# Patient Record
Sex: Female | Born: 1975 | Hispanic: Yes | Marital: Married | Smoking: Never smoker
Health system: Southern US, Community
[De-identification: ages and names within clinical notes are randomized; demographics above are authoritative.]

## PROBLEM LIST (undated history)

## (undated) DIAGNOSIS — R809 Proteinuria, unspecified: Secondary | ICD-10-CM

## (undated) DIAGNOSIS — IMO0002 Reserved for concepts with insufficient information to code with codable children: Secondary | ICD-10-CM

## (undated) DIAGNOSIS — N289 Disorder of kidney and ureter, unspecified: Secondary | ICD-10-CM

## (undated) DIAGNOSIS — K851 Biliary acute pancreatitis without necrosis or infection: Secondary | ICD-10-CM

## (undated) DIAGNOSIS — M3214 Glomerular disease in systemic lupus erythematosus: Secondary | ICD-10-CM

## (undated) DIAGNOSIS — Z992 Dependence on renal dialysis: Secondary | ICD-10-CM

## (undated) DIAGNOSIS — N179 Acute kidney failure, unspecified: Secondary | ICD-10-CM

## (undated) DIAGNOSIS — Z5189 Encounter for other specified aftercare: Secondary | ICD-10-CM

## (undated) DIAGNOSIS — D696 Thrombocytopenia, unspecified: Secondary | ICD-10-CM

## (undated) DIAGNOSIS — I509 Heart failure, unspecified: Secondary | ICD-10-CM

## (undated) DIAGNOSIS — D069 Carcinoma in situ of cervix, unspecified: Secondary | ICD-10-CM

## (undated) DIAGNOSIS — J189 Pneumonia, unspecified organism: Secondary | ICD-10-CM

## (undated) DIAGNOSIS — IMO0001 Reserved for inherently not codable concepts without codable children: Secondary | ICD-10-CM

## (undated) DIAGNOSIS — G40909 Epilepsy, unspecified, not intractable, without status epilepticus: Secondary | ICD-10-CM

## (undated) DIAGNOSIS — D649 Anemia, unspecified: Secondary | ICD-10-CM

## (undated) DIAGNOSIS — M329 Systemic lupus erythematosus, unspecified: Secondary | ICD-10-CM

## (undated) DIAGNOSIS — N189 Chronic kidney disease, unspecified: Secondary | ICD-10-CM

## (undated) DIAGNOSIS — I639 Cerebral infarction, unspecified: Secondary | ICD-10-CM

## (undated) DIAGNOSIS — I10 Essential (primary) hypertension: Secondary | ICD-10-CM

## (undated) HISTORY — DX: Anemia, unspecified: D64.9

## (undated) HISTORY — PX: DILATION AND CURETTAGE OF UTERUS: SHX78

## (undated) HISTORY — PX: APPENDECTOMY: SHX54

## (undated) HISTORY — PX: AV FISTULA PLACEMENT: SHX1204

## (undated) HISTORY — PX: TUBAL LIGATION: SHX77

## (undated) HISTORY — DX: Thrombocytopenia, unspecified: D69.6

## (undated) HISTORY — PX: CERVICAL CONE BIOPSY: SUR198

## (undated) HISTORY — PX: CHOLECYSTECTOMY: SHX55

---

## 2005-01-06 ENCOUNTER — Encounter: Payer: Self-pay | Admitting: Obstetrics and Gynecology

## 2005-01-06 ENCOUNTER — Inpatient Hospital Stay (HOSPITAL_COMMUNITY): Admission: AD | Admit: 2005-01-06 | Discharge: 2005-01-08 | Payer: Self-pay | Admitting: Obstetrics and Gynecology

## 2009-01-23 ENCOUNTER — Encounter (INDEPENDENT_AMBULATORY_CARE_PROVIDER_SITE_OTHER): Payer: Self-pay | Admitting: Unknown Physician Specialty

## 2009-01-23 ENCOUNTER — Other Ambulatory Visit: Admission: RE | Admit: 2009-01-23 | Discharge: 2009-01-23 | Payer: Self-pay | Admitting: Unknown Physician Specialty

## 2009-06-19 DIAGNOSIS — K851 Biliary acute pancreatitis without necrosis or infection: Secondary | ICD-10-CM

## 2009-06-19 HISTORY — DX: Biliary acute pancreatitis without necrosis or infection: K85.10

## 2009-07-08 ENCOUNTER — Inpatient Hospital Stay (HOSPITAL_COMMUNITY): Admission: EM | Admit: 2009-07-08 | Discharge: 2009-07-17 | Payer: Self-pay | Admitting: Emergency Medicine

## 2009-07-13 ENCOUNTER — Encounter (INDEPENDENT_AMBULATORY_CARE_PROVIDER_SITE_OTHER): Payer: Self-pay | Admitting: General Surgery

## 2009-07-13 ENCOUNTER — Ambulatory Visit: Payer: Self-pay | Admitting: Internal Medicine

## 2009-07-14 ENCOUNTER — Ambulatory Visit: Payer: Self-pay | Admitting: Internal Medicine

## 2009-07-16 ENCOUNTER — Ambulatory Visit: Payer: Self-pay | Admitting: Internal Medicine

## 2009-07-17 DIAGNOSIS — D069 Carcinoma in situ of cervix, unspecified: Secondary | ICD-10-CM

## 2009-07-17 HISTORY — DX: Carcinoma in situ of cervix, unspecified: D06.9

## 2009-07-19 ENCOUNTER — Encounter: Payer: Self-pay | Admitting: Internal Medicine

## 2009-07-25 ENCOUNTER — Encounter: Payer: Self-pay | Admitting: Internal Medicine

## 2009-07-31 ENCOUNTER — Other Ambulatory Visit: Admission: RE | Admit: 2009-07-31 | Discharge: 2009-07-31 | Payer: Self-pay | Admitting: Unknown Physician Specialty

## 2009-12-17 ENCOUNTER — Emergency Department (HOSPITAL_COMMUNITY): Admission: EM | Admit: 2009-12-17 | Discharge: 2009-12-17 | Payer: Self-pay | Admitting: Emergency Medicine

## 2010-06-18 ENCOUNTER — Other Ambulatory Visit (HOSPITAL_COMMUNITY): Admission: RE | Admit: 2010-06-18 | Payer: Self-pay | Source: Ambulatory Visit

## 2010-06-18 ENCOUNTER — Other Ambulatory Visit (HOSPITAL_COMMUNITY)
Admission: RE | Admit: 2010-06-18 | Discharge: 2010-06-18 | Disposition: A | Discharge status: Home / Self Care | Payer: Self-pay | Source: Ambulatory Visit | Attending: *Deleted | Admitting: *Deleted

## 2010-06-18 ENCOUNTER — Other Ambulatory Visit (HOSPITAL_COMMUNITY)
Admission: RE | Admit: 2010-06-18 | Discharge: 2010-06-18 | Disposition: A | Discharge status: Home / Self Care | Payer: Self-pay | Source: Ambulatory Visit | Attending: Unknown Physician Specialty | Admitting: Unknown Physician Specialty

## 2010-06-18 ENCOUNTER — Other Ambulatory Visit: Payer: Self-pay | Admitting: Nurse Practitioner

## 2010-06-18 DIAGNOSIS — R87612 Low grade squamous intraepithelial lesion on cytologic smear of cervix (LGSIL): Secondary | ICD-10-CM | POA: Insufficient documentation

## 2010-06-18 DIAGNOSIS — R87619 Unspecified abnormal cytological findings in specimens from cervix uteri: Secondary | ICD-10-CM | POA: Insufficient documentation

## 2010-06-18 NOTE — Consult Note (Signed)
Summary: Consultation Report  Consultation Report   Imported By: Zeb Comfort 07/25/2009 14:01:01  _____________________________________________________________________  External Attachment:    Type:   Image     Comment:   External Document

## 2010-06-19 ENCOUNTER — Other Ambulatory Visit: Payer: Self-pay

## 2010-08-02 LAB — URINALYSIS, ROUTINE W REFLEX MICROSCOPIC
Ketones, ur: >80 mg/dL — AB
Urobilinogen, UA: 1 mg/dL (ref 0.0–1.0)

## 2010-08-02 LAB — DIFFERENTIAL
Basophils Absolute: 0 10*3/uL (ref 0.0–0.1)
Basophils Relative: 0 % (ref 0–1)
Eosinophils Absolute: 0 10*3/uL (ref 0.0–0.7)
Lymphocytes Relative: 15 % (ref 12–46)

## 2010-08-02 LAB — URINE CULTURE
Colony Count: >=100000
Culture  Setup Time: 201108020047

## 2010-08-02 LAB — BASIC METABOLIC PANEL
Chloride: 104 mEq/L (ref 96–112)
Creatinine, Ser: 0.67 mg/dL (ref 0.4–1.2)
GFR calc Af Amer: >60 mL/min (ref >60)
Potassium: 3.7 mEq/L (ref 3.5–5.1)
Sodium: 134 mEq/L — ABNORMAL LOW (ref 135–145)

## 2010-08-02 LAB — CBC
MCH: 26.3 pg (ref 26.0–34.0)
MCHC: 33.6 g/dL (ref 30.0–36.0)
RBC: 4.88 MIL/uL (ref 3.87–5.11)
RDW: 15.5 % (ref 11.5–15.5)
WBC: 12.5 10*3/uL — ABNORMAL HIGH (ref 4.0–10.5)

## 2010-08-02 LAB — URINE MICROSCOPIC-ADD ON

## 2010-08-02 LAB — POCT PREGNANCY, URINE: Preg Test, Ur: NEGATIVE

## 2010-08-07 LAB — HEPATIC FUNCTION PANEL
ALT: 56 U/L — ABNORMAL HIGH (ref 0–35)
ALT: 65 U/L — ABNORMAL HIGH (ref 0–35)
ALT: 96 U/L — ABNORMAL HIGH (ref 0–35)
AST: 22 U/L (ref 0–37)
AST: 41 U/L — ABNORMAL HIGH (ref 0–37)
AST: 45 U/L — ABNORMAL HIGH (ref 0–37)
Albumin: 2.7 g/dL — ABNORMAL LOW (ref 3.5–5.2)
Alkaline Phosphatase: 192 U/L — ABNORMAL HIGH (ref 39–117)
Alkaline Phosphatase: 94 U/L (ref 39–117)
Bilirubin, Direct: 0.2 mg/dL (ref 0.0–0.3)
Indirect Bilirubin: 0.3 mg/dL (ref 0.3–0.9)
Indirect Bilirubin: 0.4 mg/dL (ref 0.3–0.9)
Total Bilirubin: 0.5 mg/dL (ref 0.3–1.2)
Total Protein: 7 g/dL (ref 6.0–8.3)

## 2010-08-07 LAB — CBC
HCT: 35.7 % — ABNORMAL LOW (ref 36.0–46.0)
HCT: 38.9 % (ref 36.0–46.0)
Hemoglobin: 12.2 g/dL (ref 12.0–15.0)
Hemoglobin: 13 g/dL (ref 12.0–15.0)
Hemoglobin: 13.1 g/dL (ref 12.0–15.0)
Hemoglobin: 13.6 g/dL (ref 12.0–15.0)
MCHC: 33.4 g/dL (ref 30.0–36.0)
MCHC: 33.5 g/dL (ref 30.0–36.0)
MCHC: 33.5 g/dL (ref 30.0–36.0)
MCHC: 33.6 g/dL (ref 30.0–36.0)
MCV: 79.6 fL (ref 78.0–100.0)
MCV: 80.3 fL (ref 78.0–100.0)
Platelets: 204 10*3/uL (ref 150–400)
Platelets: 208 10*3/uL (ref 150–400)
Platelets: 239 10*3/uL (ref 150–400)
Platelets: 260 10*3/uL (ref 150–400)
RBC: 4.85 MIL/uL (ref 3.87–5.11)
RBC: 4.89 MIL/uL (ref 3.87–5.11)
RBC: 4.9 MIL/uL (ref 3.87–5.11)
RBC: 5.1 MIL/uL (ref 3.87–5.11)
RDW: 14.3 % (ref 11.5–15.5)
RDW: 14.6 % (ref 11.5–15.5)
RDW: 14.8 % (ref 11.5–15.5)
WBC: 7.5 10*3/uL (ref 4.0–10.5)
WBC: 9.7 10*3/uL (ref 4.0–10.5)
WBC: 9.7 10*3/uL (ref 4.0–10.5)

## 2010-08-07 LAB — COMPREHENSIVE METABOLIC PANEL
AST: 490 U/L — ABNORMAL HIGH (ref 0–37)
Albumin: 4 g/dL (ref 3.5–5.2)
BUN: 12 mg/dL (ref 6–23)
CO2: 23 mEq/L (ref 19–32)
Chloride: 107 mEq/L (ref 96–112)
Creatinine, Ser: 0.72 mg/dL (ref 0.4–1.2)
GFR calc non Af Amer: >60 mL/min (ref >60)
Glucose, Bld: 136 mg/dL — ABNORMAL HIGH (ref 70–99)
Potassium: 3.6 mEq/L (ref 3.5–5.1)
Sodium: 138 mEq/L (ref 135–145)
Total Bilirubin: 1.4 mg/dL — ABNORMAL HIGH (ref 0.3–1.2)

## 2010-08-07 LAB — DIFFERENTIAL
Basophils Absolute: 0 10*3/uL (ref 0.0–0.1)
Basophils Absolute: 0 10*3/uL (ref 0.0–0.1)
Basophils Absolute: 0.1 10*3/uL (ref 0.0–0.1)
Basophils Relative: 0 % (ref 0–1)
Basophils Relative: 0 % (ref 0–1)
Eosinophils Absolute: 0.2 10*3/uL (ref 0.0–0.7)
Eosinophils Relative: 2 % (ref 0–5)
Eosinophils Relative: 4 % (ref 0–5)
Lymphocytes Relative: 13 % (ref 12–46)
Lymphocytes Relative: 13 % (ref 12–46)
Lymphocytes Relative: 14 % (ref 12–46)
Lymphocytes Relative: 16 % (ref 12–46)
Lymphs Abs: 1.2 10*3/uL (ref 0.7–4.0)
Lymphs Abs: 1.3 10*3/uL (ref 0.7–4.0)
Lymphs Abs: 1.5 10*3/uL (ref 0.7–4.0)
Monocytes Absolute: 0.4 10*3/uL (ref 0.1–1.0)
Monocytes Absolute: 0.5 10*3/uL (ref 0.1–1.0)
Monocytes Absolute: 0.5 10*3/uL (ref 0.1–1.0)
Monocytes Relative: 4 % (ref 3–12)
Monocytes Relative: 5 % (ref 3–12)
Neutro Abs: 10.5 10*3/uL — ABNORMAL HIGH (ref 1.7–7.7)
Neutro Abs: 12 10*3/uL — ABNORMAL HIGH (ref 1.7–7.7)
Neutro Abs: 5.6 10*3/uL (ref 1.7–7.7)
Neutro Abs: 9.4 10*3/uL — ABNORMAL HIGH (ref 1.7–7.7)
Neutrophils Relative %: 79 % — ABNORMAL HIGH (ref 43–77)
Neutrophils Relative %: 81 % — ABNORMAL HIGH (ref 43–77)
Neutrophils Relative %: 82 % — ABNORMAL HIGH (ref 43–77)
Neutrophils Relative %: 82 % — ABNORMAL HIGH (ref 43–77)

## 2010-08-07 LAB — BASIC METABOLIC PANEL
BUN: 6 mg/dL (ref 6–23)
CO2: 23 mEq/L (ref 19–32)
CO2: 23 mEq/L (ref 19–32)
CO2: 23 mEq/L (ref 19–32)
CO2: 25 mEq/L (ref 19–32)
Calcium: 8.4 mg/dL (ref 8.4–10.5)
Calcium: 8.4 mg/dL (ref 8.4–10.5)
Chloride: 102 mEq/L (ref 96–112)
Chloride: 103 mEq/L (ref 96–112)
Chloride: 104 mEq/L (ref 96–112)
Chloride: 111 mEq/L (ref 96–112)
Creatinine, Ser: 0.62 mg/dL (ref 0.4–1.2)
Creatinine, Ser: 0.64 mg/dL (ref 0.4–1.2)
Creatinine, Ser: 0.64 mg/dL (ref 0.4–1.2)
GFR calc Af Amer: >60 mL/min (ref >60)
GFR calc Af Amer: >60 mL/min (ref >60)
GFR calc Af Amer: >60 mL/min (ref >60)
GFR calc Af Amer: >60 mL/min (ref >60)
GFR calc Af Amer: >60 mL/min (ref >60)
GFR calc non Af Amer: >60 mL/min (ref >60)
GFR calc non Af Amer: >60 mL/min (ref >60)
GFR calc non Af Amer: >60 mL/min (ref >60)
Glucose, Bld: 118 mg/dL — ABNORMAL HIGH (ref 70–99)
Glucose, Bld: 98 mg/dL (ref 70–99)
Potassium: 4.1 mEq/L (ref 3.5–5.1)
Potassium: 4.2 mEq/L (ref 3.5–5.1)
Sodium: 134 mEq/L — ABNORMAL LOW (ref 135–145)
Sodium: 135 mEq/L (ref 135–145)
Sodium: 138 mEq/L (ref 135–145)

## 2010-08-07 LAB — URINALYSIS, ROUTINE W REFLEX MICROSCOPIC
Bilirubin Urine: NEGATIVE
Hgb urine dipstick: NEGATIVE
Ketones, ur: 15 mg/dL — AB
Urobilinogen, UA: 0.2 mg/dL (ref 0.0–1.0)
pH: 6 (ref 5.0–8.0)

## 2010-08-07 LAB — AMYLASE
Amylase: 349 U/L — ABNORMAL HIGH (ref 0–105)
Amylase: 75 U/L (ref 0–105)

## 2010-08-07 LAB — PREGNANCY, URINE: Preg Test, Ur: NEGATIVE

## 2010-08-07 LAB — LIPASE, BLOOD: Lipase: 145 U/L — ABNORMAL HIGH (ref 11–59)

## 2010-08-11 LAB — HEPATIC FUNCTION PANEL
Bilirubin, Direct: 0.1 mg/dL (ref 0.0–0.3)
Indirect Bilirubin: 0.4 mg/dL (ref 0.3–0.9)
Total Bilirubin: 0.5 mg/dL (ref 0.3–1.2)

## 2010-10-04 NOTE — Discharge Summary (Signed)
Maria Vazquez, Maria Vazquez             ACCOUNT NO.:  [id number]   MEDICAL RECORD NO.:  [id number]          PATIENT TYPE:  INP   LOCATION:  A401                          FACILITY:  [facility]   PHYSICIAN:  Jonnie Kind, M.D. DATE OF BIRTH:  09/10/1975   DATE OF ADMISSION:  01/06/2005  DATE OF DISCHARGE:  08/23/2006LH                                 DISCHARGE SUMMARY   ADMISSION DIAGNOSES:  1.  Pregnancy, 40 weeks.  2.  Early labor.   DISCHARGE DIAGNOSES:  1.  Pregnancy, 40 weeks.  2.  Double footling breech presentation.   PROCEDURE:  Primary low transverse cervical Cesarean section by Jonnie Kind, M.D.   DISCHARGE MEDICATIONS:  Tylox 15 tablets 1 q.4h. p.r.n. pain.   FOLLOW UP:  At 4 p.m. Monday, August 28, for staple removal.   HOSPITAL COURSE:  This 35 year old multiparous Hispanic female was seen in  labor and delivery after being admitted 2 a.m. in early labor. When examined  at approximately 8 a.m., we were able to determine that it was breech, and  with membranes remaining intact and cervical exam 4 to 5 cm, 80%, -1. She  was given tocolytics, scheduled and taken for primary Cesarean section,  delivering a healthy female infant from a double footling breech  presentation. There was 800 to 1,000 cc of blood loss. Increased blood loss  was due to large vein in the left side of the lower uterine segment  incision. Postoperatively, the patient has surprisingly minimal change in  hematocrit to 35.1 postoperative compared to 34.9 preoperative, actually  showing a perceived increase. Blood type was O positive. She did not require  transfusions. She had uneventful two-day course and was discharged home on a  regular diet with Tylox x15 tablets when discharged January 08, 2005.      Jonnie Kind, M.D.  Electronically Signed     JVF/MEDQ  D:  01/08/2005  T:  01/08/2005  Job:  [id number]   cc:   [facility] Department

## 2010-10-04 NOTE — Op Note (Signed)
[name], Maria Vazquez             ACCOUNT NO.:  [id number]   MEDICAL RECORD NO.:  [id number]          PATIENT TYPE:  INP   LOCATION:  LDR1                          FACILITY:  [facility]   PHYSICIAN:  Jonnie Kind, M.D. DATE OF BIRTH:  [birth year]   DATE OF PROCEDURE:  01/06/2005  DATE OF DISCHARGE:                                 OPERATIVE REPORT   PREOPERATIVE DIAGNOSES:  1.  Pregnancy, 40 weeks.  2.  Active phase labor.  3.  Persistent breech presentation.   POSTOPERATIVE DIAGNOSES:  1.  Pregnancy, 40 weeks.  2.  Active phase labor.  3.  Persistent breech presentation.  4.  Delivered.   PROCEDURE:  Primary low transverse cervical Cesarean section.   SURGEON:  Dr. Glo Herring.   ASSISTANT:  Tulloch, C.S.T.-F.A. Blackwell, C.S.T.   ANESTHESIA:  Spinal. Cletus Gash, C.R.N.A.   COMPLICATIONS:  None.   FINDINGS:  Healthy female infant, Apgars 9 and 9. Clear amniotomy fluid.  Double footling breech presentation.   DETAILS OF PROCEDURE:  The patient was taken to the operating room and  prepped and draped in the usual fashion for lower abdominal surgery with  Foley catheter in place. Fetal heart documentation was performed shortly  before prepping. Once anesthesia was confirmed, transverse lower abdominal  incision was made in the method of Pfannenstiel, with easily entry into the  peritoneal cavity and development of the bladder flap on the well-developed  lower uterine segment. We then proceeded to make a transverse uterine  incision, extended laterally using index finger traction and identified the  feet of the infant just below the incision. A complete breech extraction was  performed, grasping the feet, spinning the baby and delivering the buttocks  easily with fundal pressure being applied during this portion of the  procedure. The arms were individually swept in front of the body and then  head delivered while maintaining an index finger in the mouth to maintain  proper  orientation. Fundal pressure with primary propulsive force. The  infant cried promptly, had lots of watery mucus which was suctioned to the  best of our abilities. Then, the baby was taken to Dr. Cleta Alberts for subsequent  care. See his notes on the baby dictated elsewhere.   The uterus was inspected, and a large amount of bleeding from a large  varicosity just to the left of the __________  incision on the right. The  patient then proceeded to have the uterus irrigated with antibiotic  solution, closed using a running locking 0 chromic. Separate ligature had  been placed around the large varicosity on the left upper portion of the  uterine incision. We did not tear into the uterine vessels. Once the single  layer of running locking closure was completed, hemostasis was quite good.  The abdomen was irrigated with antibiotic solution as well. Bladder flap  reapproximated with 2-0 chromic, the anterior peritoneum closed with 2-0  chromic, the fascia closed with continuous running 0 Vicryl. The  subcutaneous tissues were approximated with interrupted 2-0 plain. Staple  closure of the skin completed the procedure. The patient tolerated the  procedure well. Operative  time 26 minutes.      Jonnie Kind, M.D.  Electronically Signed     JVF/MEDQ  D:  01/06/2005  T:  01/06/2005  Job:  [id number]   cc:   [facility] Department

## 2010-10-04 NOTE — H&P (Signed)
Maria Vazquez, Maria Vazquez             ACCOUNT NO.:  [id number]   MEDICAL RECORD NO.:  [id number]          PATIENT TYPE:  INP   LOCATION:  LDR1                          FACILITY:  [facility]   PHYSICIAN:  Jonnie Kind, M.D. DATE OF BIRTH:  07-28-1975   DATE OF ADMISSION:  01/06/2005  DATE OF DISCHARGE:  LH                                HISTORY & PHYSICAL   REASON FOR ADMISSION:  Pregnancy at 40 weeks in early labor.   HISTORY OF PRESENT ILLNESS:  Ms. Maria Vazquez is a 35 year old, G2, P1 that is due  today admitted in early labor.   PAST MEDICAL HISTORY:  Negative.   PAST SURGICAL HISTORY:  Negative.   ALLERGIES:  No known drug allergies.   SOCIAL HISTORY:  She is married.  Her husband is present and supportive.   PRENATAL COURSE:  Prenatal course was uneventful.  Blood type is O positive.  UDS negative.  Rubella immune.  HIV nonreactive.  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  Pap smear normal.  GC and Chlamydia on both cultures were  negative.  GBS is negative.  A 28-week hemoglobin 11.6, hematocrit 34.9.  One-hour glucose 121.  Sickle cell screening is negative.   PLAN:  We are going to admit and expect vaginal delivery.      Daiva Nakayama, Tonita Phoenix      Jonnie Kind, M.D.  Electronically Signed    DL/MEDQ  D:  01/06/2005  T:  01/06/2005  Job:  [id number]   cc:   [facility]

## 2011-01-09 ENCOUNTER — Other Ambulatory Visit: Payer: Self-pay

## 2011-01-09 ENCOUNTER — Emergency Department (HOSPITAL_COMMUNITY): Payer: Medicaid Other

## 2011-01-09 ENCOUNTER — Inpatient Hospital Stay (HOSPITAL_COMMUNITY)
Admission: EM | Admit: 2011-01-09 | Discharge: 2011-01-14 | DRG: 696 | Disposition: A | Discharge status: Home / Self Care | Payer: Medicaid Other | Attending: Internal Medicine | Admitting: Internal Medicine

## 2011-01-09 ENCOUNTER — Encounter: Payer: Self-pay | Admitting: *Deleted

## 2011-01-09 DIAGNOSIS — D631 Anemia in chronic kidney disease: Secondary | ICD-10-CM | POA: Diagnosis present

## 2011-01-09 DIAGNOSIS — R220 Localized swelling, mass and lump, head: Secondary | ICD-10-CM | POA: Diagnosis present

## 2011-01-09 DIAGNOSIS — R0602 Shortness of breath: Secondary | ICD-10-CM | POA: Diagnosis present

## 2011-01-09 DIAGNOSIS — I059 Rheumatic mitral valve disease, unspecified: Secondary | ICD-10-CM

## 2011-01-09 DIAGNOSIS — R809 Proteinuria, unspecified: Principal | ICD-10-CM | POA: Diagnosis present

## 2011-01-09 DIAGNOSIS — D649 Anemia, unspecified: Secondary | ICD-10-CM

## 2011-01-09 DIAGNOSIS — D509 Iron deficiency anemia, unspecified: Secondary | ICD-10-CM | POA: Diagnosis present

## 2011-01-09 DIAGNOSIS — I509 Heart failure, unspecified: Secondary | ICD-10-CM

## 2011-01-09 DIAGNOSIS — G510 Bell's palsy: Secondary | ICD-10-CM | POA: Diagnosis present

## 2011-01-09 DIAGNOSIS — M7989 Other specified soft tissue disorders: Secondary | ICD-10-CM | POA: Diagnosis present

## 2011-01-09 HISTORY — DX: Biliary acute pancreatitis without necrosis or infection: K85.10

## 2011-01-09 HISTORY — DX: Carcinoma in situ of cervix, unspecified: D06.9

## 2011-01-09 LAB — URINALYSIS, ROUTINE W REFLEX MICROSCOPIC
Nitrite: NEGATIVE
Specific Gravity, Urine: 1.025 (ref 1.005–1.030)
Urobilinogen, UA: 0.2 mg/dL (ref 0.0–1.0)
pH: 7 (ref 5.0–8.0)

## 2011-01-09 LAB — HEMOGLOBIN A1C
Hgb A1c MFr Bld: 5.5 %
Mean Plasma Glucose: 111 mg/dL

## 2011-01-09 LAB — BLOOD GAS, ARTERIAL
FIO2: 0.21 %
TCO2: 18.4 mmol/L (ref 0–100)
pCO2 arterial: 30 mmHg — ABNORMAL LOW (ref 35.0–45.0)
pH, Arterial: 7.44 — ABNORMAL HIGH (ref 7.350–7.400)
pO2, Arterial: 85.1 mmHg (ref 80.0–100.0)

## 2011-01-09 LAB — BASIC METABOLIC PANEL
BUN: 30 mg/dL — ABNORMAL HIGH (ref 6–23)
CO2: 22 mEq/L (ref 19–32)
Calcium: 8.7 mg/dL (ref 8.4–10.5)
Creatinine, Ser: 0.92 mg/dL (ref 0.50–1.10)
GFR calc non Af Amer: >60 mL/min (ref >60)
Glucose, Bld: 84 mg/dL (ref 70–99)

## 2011-01-09 LAB — LIPID PANEL
LDL Cholesterol: 85 mg/dL (ref 0–99)
VLDL: 29 mg/dL (ref 0–40)

## 2011-01-09 LAB — URINE MICROSCOPIC-ADD ON

## 2011-01-09 LAB — DIFFERENTIAL
Eosinophils Absolute: 0 10*3/uL (ref 0.0–0.7)
Eosinophils Relative: 0 % (ref 0–5)
Lymphocytes Relative: 18 % (ref 12–46)
Lymphs Abs: 3.1 10*3/uL (ref 0.7–4.0)
Monocytes Absolute: 0.5 10*3/uL (ref 0.1–1.0)
Monocytes Relative: 3 % (ref 3–12)

## 2011-01-09 LAB — FOLATE: Folate: 16.5 ng/mL

## 2011-01-09 LAB — IRON AND TIBC: Saturation Ratios: 11 % — ABNORMAL LOW (ref 20–55)

## 2011-01-09 LAB — CBC
HCT: 30.2 % — ABNORMAL LOW (ref 36.0–46.0)
MCH: 27 pg (ref 26.0–34.0)
MCV: 79.1 fL (ref 78.0–100.0)
RBC: 3.82 MIL/uL — ABNORMAL LOW (ref 3.87–5.11)
WBC: 17.3 10*3/uL — ABNORMAL HIGH (ref 4.0–10.5)

## 2011-01-09 LAB — RETICULOCYTES
RBC.: 3.88 MIL/uL (ref 3.87–5.11)
Retic Ct Pct: 1.6 % (ref 0.4–3.1)

## 2011-01-09 LAB — PROTIME-INR
INR: 0.98 (ref 0.00–1.49)
Prothrombin Time: 13.2 s (ref 11.6–15.2)

## 2011-01-09 LAB — HEPATIC FUNCTION PANEL
AST: 19 U/L (ref 0–37)
Bilirubin, Direct: 0.1 mg/dL (ref 0.0–0.3)
Indirect Bilirubin: 0.2 mg/dL — ABNORMAL LOW (ref 0.3–0.9)
Total Protein: 6.4 g/dL (ref 6.0–8.3)

## 2011-01-09 LAB — CARDIAC PANEL(CRET KIN+CKTOT+MB+TROPI)
CK, MB: 2.1 ng/mL (ref 0.3–4.0)
CK, MB: 1.9 ng/mL (ref 0.3–4.0)
Relative Index: INVALID (ref 0.0–2.5)
Total CK: 35 U/L (ref 7–177)
Troponin I: <0.3 ng/mL (ref <0.30)

## 2011-01-09 LAB — TSH: TSH: 3.283 u[IU]/mL (ref 0.350–4.500)

## 2011-01-09 LAB — D-DIMER, QUANTITATIVE: D-Dimer, Quant: 1.82 ug/mL-FEU — ABNORMAL HIGH (ref 0.00–0.48)

## 2011-01-09 LAB — VITAMIN B12: Vitamin B-12: 310 pg/mL (ref 211–911)

## 2011-01-09 LAB — APTT: aPTT: 32 s (ref 24–37)

## 2011-01-09 MED ORDER — NITROGLYCERIN 2 % TD OINT
1.0000 [in_us] | TOPICAL_OINTMENT | Freq: Once | TRANSDERMAL | Status: AC
  Administered 2011-01-09: 1 [in_us] via TOPICAL

## 2011-01-09 MED ORDER — SENNOSIDES-DOCUSATE SODIUM 8.6-50 MG PO TABS: 1.0000 | ORAL_TABLET | Freq: Every day | ORAL | Status: DC | PRN

## 2011-01-09 MED ORDER — FUROSEMIDE 10 MG/ML IJ SOLN
40.0000 mg | Freq: Once | INTRAMUSCULAR | Status: AC
  Administered 2011-01-09: 40 mg via INTRAVENOUS

## 2011-01-09 MED ORDER — ACETAMINOPHEN 325 MG PO TABS
650.0000 mg | ORAL_TABLET | Freq: Four times a day (QID) | ORAL | Status: DC | PRN
  Administered 2011-01-12 – 2011-01-14 (×2): 650 mg via ORAL
  Filled 2011-01-09 (×2): qty 2

## 2011-01-09 MED ORDER — ONDANSETRON HCL 4 MG/2ML IJ SOLN: 4.0000 mg | Freq: Four times a day (QID) | INTRAMUSCULAR | Status: DC | PRN

## 2011-01-09 MED ORDER — ASPIRIN EC 81 MG PO TBEC
81.0000 mg | DELAYED_RELEASE_TABLET | Freq: Every day | ORAL | Status: DC
  Administered 2011-01-09 – 2011-01-10 (×2): 81 mg via ORAL
  Filled 2011-01-09: qty 1

## 2011-01-09 MED ORDER — POTASSIUM CHLORIDE CRYS ER 20 MEQ PO TBCR
40.0000 meq | EXTENDED_RELEASE_TABLET | ORAL | Status: AC
  Administered 2011-01-09: 40 meq via ORAL

## 2011-01-09 MED ORDER — CAPTOPRIL 12.5 MG PO TABS
ORAL_TABLET | ORAL | Status: AC
  Administered 2011-01-09: 10:00:00
  Filled 2011-01-09: qty 1

## 2011-01-09 MED ORDER — CAPTOPRIL 12.5 MG PO TABS
6.2500 mg | ORAL_TABLET | Freq: Three times a day (TID) | ORAL | Status: DC
  Administered 2011-01-09 (×3): 6.25 mg via ORAL
  Administered 2011-01-10: 12.5 mg via ORAL
  Administered 2011-01-10 – 2011-01-11 (×3): 6.25 mg via ORAL
  Filled 2011-01-09 (×6): qty 1

## 2011-01-09 MED ORDER — BISACODYL 10 MG RE SUPP: 10.0000 mg | RECTAL | Status: DC | PRN

## 2011-01-09 MED ORDER — ONDANSETRON HCL 4 MG PO TABS: 4.0000 mg | ORAL_TABLET | Freq: Four times a day (QID) | ORAL | Status: DC | PRN

## 2011-01-09 MED ORDER — FUROSEMIDE 10 MG/ML IJ SOLN
40.0000 mg | Freq: Two times a day (BID) | INTRAMUSCULAR | Status: DC
  Administered 2011-01-09 – 2011-01-10 (×2): 40 mg via INTRAVENOUS
  Filled 2011-01-09 (×2): qty 4

## 2011-01-09 MED ORDER — ENOXAPARIN SODIUM 40 MG/0.4ML ~~LOC~~ SOLN
SUBCUTANEOUS | Status: AC
  Administered 2011-01-09: 10:00:00
  Filled 2011-01-09: qty 0.4

## 2011-01-09 MED ORDER — ALBUTEROL SULFATE (5 MG/ML) 0.5% IN NEBU
2.5000 mg | INHALATION_SOLUTION | Freq: Once | RESPIRATORY_TRACT | Status: AC
  Administered 2011-01-09: 2.5 mg via RESPIRATORY_TRACT

## 2011-01-09 MED ORDER — MORPHINE SULFATE 2 MG/ML IJ SOLN
1.0000 mg | INTRAMUSCULAR | Status: DC | PRN
  Administered 2011-01-09 (×2): 1 mg via INTRAVENOUS
  Filled 2011-01-09 (×3): qty 1

## 2011-01-09 MED ORDER — TRAZODONE HCL 50 MG PO TABS
25.0000 mg | ORAL_TABLET | Freq: Every evening | ORAL | Status: DC | PRN
  Administered 2011-01-09: 25 mg via ORAL
  Filled 2011-01-09: qty 1

## 2011-01-09 MED ORDER — ENOXAPARIN SODIUM 40 MG/0.4ML ~~LOC~~ SOLN
40.0000 mg | Freq: Every day | SUBCUTANEOUS | Status: DC
  Administered 2011-01-09 – 2011-01-12 (×4): 40 mg via SUBCUTANEOUS
  Filled 2011-01-09 (×3): qty 0.4

## 2011-01-09 MED ORDER — ASPIRIN EC 81 MG PO TBEC
DELAYED_RELEASE_TABLET | ORAL | Status: AC
  Filled 2011-01-09: qty 1

## 2011-01-09 MED ORDER — FLEET ENEMA 7-19 GM/118ML RE ENEM: 1.0000 | ENEMA | RECTAL | Status: DC | PRN

## 2011-01-09 MED ORDER — POTASSIUM CHLORIDE CRYS ER 20 MEQ PO TBCR
40.0000 meq | EXTENDED_RELEASE_TABLET | Freq: Two times a day (BID) | ORAL | Status: DC
  Administered 2011-01-09 – 2011-01-12 (×6): 40 meq via ORAL
  Filled 2011-01-09 (×6): qty 2

## 2011-01-09 MED ORDER — ACETAMINOPHEN 650 MG RE SUPP: 650.0000 mg | Freq: Four times a day (QID) | RECTAL | Status: DC | PRN

## 2011-01-09 NOTE — H&P (Signed)
PCP:   No primary provider on file.   Chief Complaint:  SOB since 2 am.  HPI: 35 yo Hispanic lady, a [place] resident x 12 years, no recent travels, no sick contacts, no recent illneses, noted swelling of face and legs 4 days ago. Visited health clinic 3 days ago and was started on a prednisone taper for presumed Bells Palsy. Called yesterday with results of blood work and told that her "proteins were low", then woke up last night with sudden SOB and came to the ED.   Denies and previous similar symptoms; denies PND; currently mild orthopnea.  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 youngest child 36 yrs old.  Review of Systems:  The patient denies anorexia, fever, weight loss,, vision loss, decreased hearing, hoarseness, chest pain, syncope, dyspnea on exertion, peripheral edema, balance deficits, hemoptysis, abdominal pain, melena, hematochezia, severe indigestion/heartburn, hematuria, incontinence, genital sores, muscle weakness, suspicious skin lesions, transient blindness, difficulty walking, depression, unusual weight change, abnormal bleeding, enlarged lymph nodes, angioedema, and breast masses.  Past Medical History: Past Medical History  Diagnosis Date  . Gallstone pancreatitis Feb 2011  . Cervix carcinoma in situ Mar 2011   Past Surgical History  Procedure Date  . Appendectomy   . Cervical cone biopsy   . Cholecystectomy, laparoscopic   . Cesarean section     Medications: Prior to Admission medications   Medication Sig Start Date End Date Taking? Authorizing Provider  predniSONE (DELTASONE) 10 MG tablet Take 10 mg by mouth daily. Dose pack day 1 take 6, day 2 take 5 and so on    Yes Historical Provider, MD    Allergies:  No Known Allergies  Social History:  reports that she has never smoked. She does not have any smokeless tobacco history on file. She reports that she does not drink alcohol or use illicit  drugs.  Family History: Family History  Problem Relation Age of Onset  . Diabetes Mother   . Diabetes Brother   . Diabetes Brother   . Diabetes Maternal Grandmother     Physical Exam: Filed Vitals:   01/09/11 0448 01/09/11 0501 01/09/11 0521 01/09/11 0627  BP: 138/102  138/90 140/84  Pulse: 87     Temp:    98.5 F (36.9 C)  TempSrc:      Resp: 20  18 16   Height:      Weight:      SpO2: 96% 95% 98% 97%   General appearance: alert, cooperative, mild distress and moderately obese Head: Normocephalic, without obvious abnormality, atraumatic Eyes: conjunctivae/corneas clear. PERRL,  Throat: lips, mucosa, and tongue normal; teeth and gums normal Neck: no adenopathy, no carotid bruit, JVD 8 cm; supple, symmetrical, trachea midline and thyroid not enlarged, symmetric, no tenderness/mass/nodules Back: symmetric, no curvature. ROM normal. No CVA tenderness. Resp: tachypneic; clear to auscultation bilaterally Chest wall: no tenderness Cardio: tachycardic,  and rhythm, S1, S2 normal, no murmur, click, rub or gallop GI: soft, non-tender; bowel sounds normal; no masses,  no organomegaly Extremities: edema trace edema bilaterally; Homans sign is negative, no sign of DVT Pulses: 2+ and symmetric Skin: Skin color, texture, turgor normal. No rashes or lesions Neurologic: Grossly normal   Labs on Admission:   [facility] 01/09/11 0407  NA 137  K 3.4*  CL 107  CO2 22  GLUCOSE 84  BUN 30*  CREATININE 0.92  CALCIUM 8.7  MG --  PHOS --  Basename 01/09/11 0444  AST 19  ALT 21  ALKPHOS 72  BILITOT 0.3  PROT 6.4  ALBUMIN 2.8*     Basename 01/09/11 0407  WBC 17.3*  NEUTROABS 13.7*  HGB 10.3*  HCT 30.2*  MCV 79.1  PLT 232   BNP:>1000  Urinalysis: >300 protein; 20 - 50 rbcs  Radiological Exams on Admission: Dg Chest 1 View  01/09/2011  *RADIOLOGY REPORT*  Clinical Data: Shortness of breath.  CHEST - 1 VIEW  Comparison: None.  Findings: The lungs are well-aerated.   Mild vascular congestion is noted.  Increased interstitial markings, more prominent on the right, may reflect mild interstitial edema.  There is no evidence of pleural effusion or pneumothorax.  The cardiomediastinal silhouette is borderline normal in size.  No acute osseous abnormalities are seen.  IMPRESSION: Increased interstitial markings, more prominent on the right, may reflect mild interstitial edema; mild vascular congestion noted.  Original Report Authenticated By: Santa Lighter, M.D.    Assessment/Plan Present on Admission:  .SOB (shortness of breath) .Bilateral swelling of feet .Facial swelling .Proteinuria .Anemia Metabolic alkalosis due to tachypnea  Facial swelling makes kidney disease more likely; will start empiric diuresis with potassium supplementation, and monitor electrolytes, while we confirm nephrotic syndrome or rule in CHF;  Will get, lipid panel, 24hr urine protein and clearance, restrict fluids, daily weights, 2D ECHO;  Will check anemia panel.  Despite sudden SOB, resp alkalosis, and elevated D-dimer, will not rush to contrast-enhanced CTA in this lady with likely kidney disease.  Consult nephrologist or cardio depending on the result of the above tests, then start inviestigation for primary cause.  Other plans as per orders.  Rahel Carlton 01/09/2011, 7:25 AM

## 2011-01-09 NOTE — Progress Notes (Signed)
This lady was admitted this morning by Dr. Karlyn Agee. Please see his history and physical examination. An echocardiogram has already been done and report is pending. Her electrocardiogram looks within normal limits and I would be very surprised if the etiology of her leg edema and dyspnea is cardiac. I wonder if she in fact does have nephrotic syndrome. A 24-hour urinary protein collection is ordered. Her albumin is decreased.  Plan: 1. Lipid panel. 2. Await results of echocardiogram. 3. Await results of 24-hour urinary protein. 4. Consider nephrology consultation depending on above results.

## 2011-01-09 NOTE — ED Provider Notes (Signed)
History     CSN: [id number] Arrival date & time: 01/09/2011  3:55 AM  Chief Complaint  Patient presents with  . Shortness of Breath   HPI Comments: Seen 0400. Patient with recent development of swelling to face and feet. [facility] and was started on prednisone. Denies fever, chills, recent URI or viral illness, nausea, vomiting, chest pain. Developed sudden onset short ness of breath while sleeping. Unable to catch her breath. Continues swelling of her face and feet. Denies recent NSAID orther than current prednisone. No OTC medications.  Patient is a 35 y.o. female presenting with shortness of breath. The history is provided by the patient. The history is limited by a language barrier (patient speaks [place]. She has her own interpreter accompanying. her.).  Shortness of Breath  The current episode started today. Episode frequency: woke her up from sleep. The problem is moderate. The symptoms are relieved by nothing. The symptoms are aggravated by a supine position and activity. Associated symptoms include orthopnea and shortness of breath. Pertinent negatives include no chest pain, no chest pressure, no fever, no rhinorrhea, no sore throat, no stridor, no cough and no wheezing. She is currently using steroids (Patient was seen at the health department on Monday for c/o facial swelling and swelling in her feet. She was put on steroids and told she had a beginning Bell's Palsy.). Her past medical history does not include asthma, past wheezing or asthma in the family. Urine output has been normal. The last void occurred less than 6 hours ago. There were no sick contacts. Recently, medical care has been given by the PCP. Services received include medications given.    History reviewed. No pertinent past medical history.  Past Surgical History  Procedure Date  . Appendectomy     History reviewed. No pertinent family history.  History  Substance Use Topics  . Smoking  status: Never Smoker   . Smokeless tobacco: Not on file  . Alcohol Use: No    OB History    Grav Para Term Preterm Abortions TAB SAB Ect Mult Living                  Review of Systems  Constitutional: Negative for fever.  HENT: Negative for sore throat and rhinorrhea.   Respiratory: Positive for shortness of breath. Negative for cough, wheezing and stridor.   Cardiovascular: Positive for orthopnea. Negative for chest pain.  Musculoskeletal:       Leg and foot swelling, facial swelling.    Physical Exam  BP 152/106  Pulse 83  Temp(Src) 98.2 F (36.8 C) (Oral)  Resp 16  Ht 5\' 6"  (1.676 m)  Wt 195 lb (88.451 kg)  BMI 31.47 kg/m2  SpO2 96%  LMP 12/19/2010  Physical Exam  Nursing note and vitals reviewed. Constitutional: She is oriented to person, place, and time. She appears well-developed and well-nourished.  HENT:  Head: Normocephalic.  Right Ear: External ear normal.  Left Ear: External ear normal.  Nose: Nose normal.  Mouth/Throat: Oropharynx is clear and moist.       Mild generalized swelling to face  Eyes: EOM are normal. Pupils are equal, round, and reactive to light.  Neck: Normal range of motion. Neck supple. No JVD present. No thyromegaly present.  Cardiovascular: Normal rate, normal heart sounds and intact distal pulses.   Pulmonary/Chest: She has no wheezes. She exhibits no tenderness.       Rales at both bases, increased effort. O2 sats on  RA 96-98%. Patient states she feels SOB  Abdominal: Soft. Bowel sounds are normal.  Musculoskeletal:       2+ pitting edema feet , trace to ankles and lower legs  Neurological: She is alert and oriented to person, place, and time. She has normal reflexes. No cranial nerve deficit.  Skin: Skin is warm and dry.    ED Course  Procedures   Results for orders placed during the hospital encounter of 01/09/11  CBC      Component Value Range   WBC 17.3 (*) 4.0 - 10.5 (K/uL)   RBC 3.82 (*) 3.87 - 5.11 (MIL/uL)    Hemoglobin 10.3 (*) 12.0 - 15.0 (g/dL)   HCT 30.2 (*) 36.0 - 46.0 (%)   MCV 79.1  78.0 - 100.0 (fL)   MCH 27.0  26.0 - 34.0 (pg)   MCHC 34.1  30.0 - 36.0 (g/dL)   RDW 14.2  11.5 - 15.5 (%)   Platelets 232  150 - 400 (K/uL)  DIFFERENTIAL      Component Value Range   Neutrophils Relative 79 (*) 43 - 77 (%)   Neutro Abs 13.7 (*) 1.7 - 7.7 (K/uL)   Lymphocytes Relative 18  12 - 46 (%)   Lymphs Abs 3.1  0.7 - 4.0 (K/uL)   Monocytes Relative 3  3 - 12 (%)   Monocytes Absolute 0.5  0.1 - 1.0 (K/uL)   Eosinophils Relative 0  0 - 5 (%)   Eosinophils Absolute 0.0  0.0 - 0.7 (K/uL)   Basophils Relative 0  0 - 1 (%)   Basophils Absolute 0.0  0.0 - 0.1 (K/uL)  BASIC METABOLIC PANEL      Component Value Range   Sodium 137  135 - 145 (mEq/L)   Potassium 3.4 (*) 3.5 - 5.1 (mEq/L)   Chloride 107  96 - 112 (mEq/L)   CO2 22  19 - 32 (mEq/L)   Glucose, Bld 84  70 - 99 (mg/dL)   BUN 30 (*) 6 - 23 (mg/dL)   Creatinine, Ser 0.92  0.50 - 1.10 (mg/dL)   Calcium 8.7  8.4 - 10.5 (mg/dL)   GFR calc non Af Amer >60  >60 (mL/min)   GFR calc Af Amer >60  >60 (mL/min)  PRO B NATRIURETIC PEPTIDE      Component Value Range   BNP, POC 1687.0 (*) 0 - 125 (pg/mL)  BLOOD GAS, ARTERIAL      Component Value Range   FIO2 0.21     O2 Content 21.0     Delivery systems ROOM AIR     pH, Arterial 7.440 (*) 7.350 - 7.400    pCO2 arterial 30.0 (*) 35.0 - 45.0 (mmHg)   pO2, Arterial 85.1  80.0 - 100.0 (mmHg)   Bicarbonate 20.0  20.0 - 24.0 (mEq/L)   TCO2 18.4  0 - 100 (mmol/L)   Acid-base deficit 3.5 (*) 0.0 - 2.0 (mmol/L)   O2 Saturation 97.9     Patient temperature 37.0     Collection site LEFT RADIAL     Drawn by [id number]     Sample type ARTERIAL     Allens test (pass/fail) PASS  PASS   D-DIMER, QUANTITATIVE      Component Value Range   D-Dimer, Quant 1.82 (*) 0.00 - 0.48 (ug/mL-FEU)  HEPATIC FUNCTION PANEL      Component Value Range   Total Protein 6.4  6.0 - 8.3 (g/dL)   Albumin 2.8 (*) 3.5 - 5.2 (g/dL)  AST 19  0 - 37 (U/L)   ALT 21  0 - 35 (U/L)   Alkaline Phosphatase 72  39 - 117 (U/L)   Total Bilirubin 0.3  0.3 - 1.2 (mg/dL)   Bilirubin, Direct 0.1  0.0 - 0.3 (mg/dL)   Indirect Bilirubin 0.2 (*) 0.3 - 0.9 (mg/dL)  URINALYSIS, ROUTINE W REFLEX MICROSCOPIC      Component Value Range   Color, Urine AMBER (*) YELLOW    Appearance CLOUDY (*) CLEAR    Specific Gravity, Urine 1.025  1.005 - 1.030    pH 7.0  5.0 - 8.0    Glucose, UA NEGATIVE  NEGATIVE (mg/dL)   Hgb urine dipstick LARGE (*) NEGATIVE    Bilirubin Urine NEGATIVE  NEGATIVE    Ketones, ur NEGATIVE  NEGATIVE (mg/dL)   Protein, ur >300 (*) NEGATIVE (mg/dL)   Urobilinogen, UA 0.2  0.0 - 1.0 (mg/dL)   Nitrite NEGATIVE  NEGATIVE    Leukocytes, UA NEGATIVE  NEGATIVE   URINE MICROSCOPIC-ADD ON      Component Value Range   Squamous Epithelial / LPF MANY (*) RARE    WBC, UA 7-10  <3 (WBC/hpf)   RBC / HPF 21-50  <3 (RBC/hpf)   Bacteria, UA MANY (*) RARE    Casts GRANULAR CAST (*) NEGATIVE    Dg Chest 1 View  01/09/2011  *RADIOLOGY REPORT*  Clinical Data: Shortness of breath.  CHEST - 1 VIEW  Comparison: None.  Findings: The lungs are well-aerated.  Mild vascular congestion is noted.  Increased interstitial markings, more prominent on the right, may reflect mild interstitial edema.  There is no evidence of pleural effusion or pneumothorax.  The cardiomediastinal silhouette is borderline normal in size.  No acute osseous abnormalities are seen.  IMPRESSION: Increased interstitial markings, more prominent on the right, may reflect mild interstitial edema; mild vascular congestion noted.  Original Report Authenticated By: Santa Lighter, M.D.     Date: 01/09/2011  0359  Rate: 80  Rhythm: normal sinus rhythm  QRS Axis: normal  Intervals: normal  ST/T Wave abnormalities: normal  Conduction Disutrbances:none  Narrative Interpretation:   Old EKG Reviewed: none available  MDM Patient with new onset lower extremity edema, facial  swelling who developed sudden shortness of breath while sleeping. Presents with PE significant for lower extremity edema 2+ pitting, rales on lung exam, increased respiratory effort with O2 sats on RA that remain 96-98%. Clinical picture c/w CHF. No recent pregnancy, known viral illness, fever, travel overseas. Patient is in CHF, xray with mild enlargement of cardiac sillouette. Labs with UA + >300 protein and granular casts, and albumin 2.8. Patient placed on oxygen, IVF begun, NTG paste placed, lasix ordered. Spoke with hospitalist for admission to the hospital. Patient and her interpreter  informed of clinical course, understand medical decision-making process, and agree with plan. The patient appears reasonably stabilized for admission considering the current resources, flow, and capabilities available in the ED at this time, and I doubt any other [facility] requiring further screening and/or treatment in the ED prior to admission. MDM Reviewed: nursing note and vitals Interpretation: labs, ECG and x-ray Total time providing critical care: 30-74 minutes. This excludes time spent performing separately reportable procedures and services. Consults: admitting MD       Gypsy Balsam. Olin Hauser, MD 01/09/11 [phone]

## 2011-01-09 NOTE — ED Notes (Signed)
Pt c/o sob that started at 2 am this evening; pt has recently been taking prednisone after a possible dx of bells palsey

## 2011-01-09 NOTE — Progress Notes (Signed)
*  PRELIMINARY RESULTS* Echocardiogram 2D Echocardiogram has been performed.  Basilia Jumbo [facility] 01/09/2011, 9:21 AM

## 2011-01-09 NOTE — Consult Note (Signed)
0602 Spoke with Dr. Megan Salon, hospitalist. He will see patient in the ER and admit.

## 2011-01-10 ENCOUNTER — Inpatient Hospital Stay (HOSPITAL_COMMUNITY): Payer: Medicaid Other

## 2011-01-10 LAB — CBC
HCT: 29.7 % — ABNORMAL LOW (ref 36.0–46.0)
Platelets: 207 10*3/uL (ref 150–400)
RDW: 14.3 % (ref 11.5–15.5)
WBC: 7.4 10*3/uL (ref 4.0–10.5)

## 2011-01-10 LAB — BASIC METABOLIC PANEL
Calcium: 8.2 mg/dL — ABNORMAL LOW (ref 8.4–10.5)
Chloride: 107 mEq/L (ref 96–112)
Creatinine, Ser: 0.89 mg/dL (ref 0.50–1.10)
GFR calc Af Amer: >60 mL/min (ref >60)
GFR calc non Af Amer: >60 mL/min (ref >60)

## 2011-01-10 LAB — PROTEIN, URINE, 24 HOUR
Collection Interval-UPROT: 24 hours
Protein, Urine: 95 mg/dL

## 2011-01-10 LAB — CREATININE CLEARANCE, URINE, 24 HOUR: Creatinine Clearance: 81 mL/min (ref 75–115)

## 2011-01-10 LAB — MAGNESIUM: Magnesium: 1.9 mg/dL (ref 1.5–2.5)

## 2011-01-10 LAB — CARDIAC PANEL(CRET KIN+CKTOT+MB+TROPI)
CK, MB: 1.7 ng/mL (ref 0.3–4.0)
Relative Index: INVALID (ref 0.0–2.5)

## 2011-01-10 LAB — PHOSPHORUS: Phosphorus: 4.7 mg/dL — ABNORMAL HIGH (ref 2.3–4.6)

## 2011-01-10 MED ORDER — FUROSEMIDE 40 MG PO TABS
40.0000 mg | ORAL_TABLET | Freq: Every day | ORAL | Status: DC
  Administered 2011-01-10 – 2011-01-14 (×4): 40 mg via ORAL
  Filled 2011-01-10 (×4): qty 1

## 2011-01-10 MED ORDER — FERROUS SULFATE 325 (65 FE) MG PO TABS
325.0000 mg | ORAL_TABLET | Freq: Three times a day (TID) | ORAL | Status: DC
  Administered 2011-01-10 – 2011-01-14 (×9): 325 mg via ORAL
  Filled 2011-01-10 (×9): qty 1

## 2011-01-10 NOTE — Progress Notes (Signed)
Notified Dr. Conley Canal of pt 24 hour urine report being back and that her Protein was 4038.  New orders for Nephrology consult will be added.

## 2011-01-10 NOTE — Progress Notes (Signed)
Subjective: Still some shortness of breath and palpitations. Edema improved Objective: Vital signs in last 24 hours: Filed Vitals:   01/09/11 0745 01/09/11 1400 01/09/11 2210 01/10/11 0500  BP: 133/87 136/90 132/40 112/68  Pulse: 83 84 76 74  Temp: 98.2 F (36.8 C) 98.9 F (37.2 C) 98.1 F (36.7 C) 98 F (36.7 C)  TempSrc: Oral  Oral   Resp: 20 18 20 20   Height: 5\' 6"  (1.676 m)     Weight: 88.4 kg (194 lb 14.2 oz)   87.9 kg (193 lb 12.6 oz)  SpO2: 96% 98% 99% 96%   Weight change: -0.051 kg (-1.8 oz)  Intake/Output Summary (Last 24 hours) at 01/10/11 1409 Last data filed at 01/10/11 0800  Gross per 24 hour  Intake    660 ml  Output   2200 ml  Net  -1540 ml   General: The patient is lying supine and appears comfortable. She has an occasional hacking cough. Lungs clear to auscultation bilaterally without wheezes rhonchi or rales Cardiovascular regular rate rhythm without murmurs gallops or rub Abdomen soft nontender nondistended Extremities trace edema Musculoskeletal no joint swelling or tenderness  Lab Results: Basic Metabolic Panel:  Lab [id number] 01/10/11 0505 01/09/11 0407  NA -- 140 137  K -- 4.3 3.4*  CL -- 107 107  CO2 -- 26 22  GLUCOSE -- 85 84  BUN -- 26* 30*  CREATININE 0.89 0.89 --  CALCIUM -- 8.2* 8.7  MG -- 1.9 --  PHOS -- 4.7* --   Liver Function Tests:  Lab 01/09/11 0444  AST 19  ALT 21  ALKPHOS 72  BILITOT 0.3  PROT 6.4  ALBUMIN 2.8*   No results found for this basename: LIPASE:2,AMYLASE:2 in the last 168 hours CBC:  Lab 01/10/11 0505 01/09/11 0407  WBC 7.4 17.3*  NEUTROABS -- 13.7*  HGB 10.1* 10.3*  HCT 29.7* 30.2*  MCV 79.2 79.1  PLT 207 232   Cardiac Enzymes:  Lab 01/10/11 01/09/11 1550 01/09/11 0721  CKTOTAL 28 28 35  CKMB 1.7 1.9 2.1  CKMBINDEX -- -- --  TROPONINI <0.30 <0.30 <0.30   BNP:  Lab 01/09/11 0430  POCBNP 1687.0*   D-Dimer:  Lab 01/09/11 0430  DDIMER 1.82*   CBG: No results found for this  basename: GLUCAP:6 in the last 168 hours Hemoglobin A1C:  Lab 01/09/11 0721  HGBA1C 5.5   Fasting Lipid Panel:  Lab 01/09/11 0740  CHOL 163  HDL 49  LDLCALC 85  TRIG 146  CHOLHDL 3.3  LDLDIRECT --   Thyroid Function Tests:  Lab 01/09/11 0721  TSH 3.283  T4TOTAL --  FREET4 --  T3FREE --  THYROIDAB --   Anemia Panel:  Lab 01/09/11 0741  VITAMINB12 310  FOLATE 16.5  FERRITIN 6*  TIBC 282  IRON 30*  RETICCTPCT 1.6    Micro Results: No results found for this or any previous visit (from the past 240 hour(s)). Studies/Results: Dg Chest 1 View  01/09/2011  *RADIOLOGY REPORT*  Clinical Data: Shortness of breath.  CHEST - 1 VIEW  Comparison: None.  Findings: The lungs are well-aerated.  Mild vascular congestion is noted.  Increased interstitial markings, more prominent on the right, may reflect mild interstitial edema.  There is no evidence of pleural effusion or pneumothorax.  The cardiomediastinal silhouette is borderline normal in size.  No acute osseous abnormalities are seen.  IMPRESSION: Increased interstitial markings, more prominent on the right, may reflect mild interstitial edema; mild vascular congestion noted.  Original Report Authenticated By: Santa Lighter, M.D.   Medications: I have reviewed the patient's current medications. Scheduled Meds:   . aspirin EC      . aspirin EC  81 mg Oral Daily  . captopril  6.25 mg Oral TID  . enoxaparin  40 mg Subcutaneous Daily  . furosemide  40 mg Intravenous Q12H  . potassium chloride  40 mEq Oral BID   Continuous Infusions:  PRN Meds:.acetaminophen, acetaminophen, bisacodyl, morphine, ondansetron (ZOFRAN) IV, ondansetron, senna-docusate, sodium phosphate, traZODone Assessment/Plan: Principal Problem: Newly diagnosed nephrotic syndrome.  I will consult Dr. Lowanda Foster for recommendations. Patient reports improvement in her edema, but she is still short of breath. I will change Lasix to by mouth. Continue captopril for now.  She reports that her uncle and her brother had kidney problems.  Iron deficiency anemia: No history of GI bleeding. I will start iron.  Recent diagnosis of Bell's palsy for which patient was on prednisone.  Discontinue telemetry   Onset L 01/10/2011, 2:09 PM

## 2011-01-10 NOTE — Consult Note (Signed)
Reason for Consult:  proteinuria Referring Physician: Dr Guerry Bruin Maria is an 35 y.o. female.  HPI: She is a patient without a significant past medical history except for a previous history of her pancreatitis and. Presently came in with the complaints of increase shortness of breath some orthopnea cough for 3-4 days duration. And patient also says that she has the swelling of her face and the also her next . Patient denies any fever chills or sweating. Solid complaint she had washers an episode of her her her sore throat her for her about a day we'll to her prior her accordingly swelling. She denies any diarrhea no urgency frequency. She denies any nausea vomiting and. And patient denies also any previous history of kidney stone no history of diabetes.  Past Medical History  Diagnosis Date  . Gallstone pancreatitis Feb 2011  . Cervix carcinoma in situ Mar 2011    Past Surgical History  Procedure Date  . Appendectomy   . Cervical cone biopsy   . Cholecystectomy, laparoscopic   . Cesarean section     Family History  Problem Relation Age of Onset  . Diabetes Mother   . Diabetes Brother   . Diabetes Brother   . Diabetes Maternal Grandmother     Social History:  reports that she has never smoked. She does not have any smokeless tobacco history on file. She reports that she does not drink alcohol or use illicit drugs.  Allergies: No Known Allergies  Medications: I have reviewed the patient's current medications.  Results for orders placed during the hospital encounter of 01/09/11 (from the past 48 hour(s))  CBC     Status: Abnormal   Collection Time   01/09/11  4:07 AM      Component Value Range Comment   WBC 17.3 (*) 4.0 - 10.5 (K/uL)    RBC 3.82 (*) 3.87 - 5.11 (MIL/uL)    Hemoglobin 10.3 (*) 12.0 - 15.0 (g/dL)    HCT 30.2 (*) 36.0 - 46.0 (%)    MCV 79.1  78.0 - 100.0 (fL)    MCH 27.0  26.0 - 34.0 (pg)    MCHC 34.1  30.0 - 36.0 (g/dL)    RDW 14.2  11.5 -  15.5 (%)    Platelets 232  150 - 400 (K/uL)   DIFFERENTIAL     Status: Abnormal   Collection Time   01/09/11  4:07 AM      Component Value Range Comment   Neutrophils Relative 79 (*) 43 - 77 (%)    Neutro Abs 13.7 (*) 1.7 - 7.7 (K/uL)    Lymphocytes Relative 18  12 - 46 (%)    Lymphs Abs 3.1  0.7 - 4.0 (K/uL)    Monocytes Relative 3  3 - 12 (%)    Monocytes Absolute 0.5  0.1 - 1.0 (K/uL)    Eosinophils Relative 0  0 - 5 (%)    Eosinophils Absolute 0.0  0.0 - 0.7 (K/uL)    Basophils Relative 0  0 - 1 (%)    Basophils Absolute 0.0  0.0 - 0.1 (K/uL)   BASIC METABOLIC PANEL     Status: Abnormal   Collection Time   01/09/11  4:07 AM      Component Value Range Comment   Sodium 137  135 - 145 (mEq/L)    Potassium 3.4 (*) 3.5 - 5.1 (mEq/L)    Chloride 107  96 - 112 (mEq/L)    CO2 22  19 -  32 (mEq/L)    Glucose, Bld 84  70 - 99 (mg/dL)    BUN 30 (*) 6 - 23 (mg/dL)    Creatinine, Ser 0.92  0.50 - 1.10 (mg/dL)    Calcium 8.7  8.4 - 10.5 (mg/dL)    GFR calc non Af Amer >60  >60 (mL/min)    GFR calc Af Amer >60  >60 (mL/min)   PRO B NATRIURETIC PEPTIDE     Status: Abnormal   Collection Time   01/09/11  4:30 AM      Component Value Range Comment   BNP, POC 1687.0 (*) 0 - 125 (pg/mL)   D-DIMER, QUANTITATIVE     Status: Abnormal   Collection Time   01/09/11  4:30 AM      Component Value Range Comment   D-Dimer, Quant 1.82 (*) 0.00 - 0.48 (ug/mL-FEU)   BLOOD GAS, ARTERIAL     Status: Abnormal   Collection Time   01/09/11  4:43 AM      Component Value Range Comment   FIO2 0.21      O2 Content 21.0      Delivery systems ROOM AIR      pH, Arterial 7.440 (*) 7.350 - 7.400     pCO2 arterial 30.0 (*) 35.0 - 45.0 (mmHg)    pO2, Arterial 85.1  80.0 - 100.0 (mmHg)    Bicarbonate 20.0  20.0 - 24.0 (mEq/L)    TCO2 18.4  0 - 100 (mmol/L)    Acid-base deficit 3.5 (*) 0.0 - 2.0 (mmol/L)    O2 Saturation 97.9      Patient temperature 37.0      Collection site LEFT RADIAL      Drawn by [id number]       Sample type ARTERIAL      Allens test (pass/fail) PASS  PASS    HEPATIC FUNCTION PANEL     Status: Abnormal   Collection Time   01/09/11  4:44 AM      Component Value Range Comment   Total Protein 6.4  6.0 - 8.3 (g/dL)    Albumin 2.8 (*) 3.5 - 5.2 (g/dL)    AST 19  0 - 37 (U/L)    ALT 21  0 - 35 (U/L)    Alkaline Phosphatase 72  39 - 117 (U/L)    Total Bilirubin 0.3  0.3 - 1.2 (mg/dL)    Bilirubin, Direct 0.1  0.0 - 0.3 (mg/dL)    Indirect Bilirubin 0.2 (*) 0.3 - 0.9 (mg/dL)   URINALYSIS, ROUTINE W REFLEX MICROSCOPIC     Status: Abnormal   Collection Time   01/09/11  5:07 AM      Component Value Range Comment   Color, Urine AMBER (*) YELLOW  BIOCHEMICALS MAY BE AFFECTED BY COLOR   Appearance CLOUDY (*) CLEAR     Specific Gravity, Urine 1.025  1.005 - 1.030     pH 7.0  5.0 - 8.0     Glucose, UA NEGATIVE  NEGATIVE (mg/dL)    Hgb urine dipstick LARGE (*) NEGATIVE     Bilirubin Urine NEGATIVE  NEGATIVE     Ketones, ur NEGATIVE  NEGATIVE (mg/dL)    Protein, ur >300 (*) NEGATIVE (mg/dL)    Urobilinogen, UA 0.2  0.0 - 1.0 (mg/dL)    Nitrite NEGATIVE  NEGATIVE     Leukocytes, UA NEGATIVE  NEGATIVE    URINE MICROSCOPIC-ADD ON     Status: Abnormal   Collection Time   01/09/11  5:07 AM  Component Value Range Comment   Squamous Epithelial / LPF MANY (*) RARE     WBC, UA 7-10  <3 (WBC/hpf)    RBC / HPF 21-50  <3 (RBC/hpf)    Bacteria, UA MANY (*) RARE     Casts GRANULAR CAST (*) NEGATIVE    PREGNANCY, URINE     Status: Normal   Collection Time   01/09/11  6:19 AM      Component Value Range Comment   Preg Test, Ur NEGATIVE     APTT     Status: Normal   Collection Time   01/09/11  7:21 AM      Component Value Range Comment   aPTT 32  24 - 37 (seconds)   PROTIME-INR     Status: Normal   Collection Time   01/09/11  7:21 AM      Component Value Range Comment   Prothrombin Time 13.2  11.6 - 15.2 (seconds)    INR 0.98  0.00 - 1.49    TSH     Status: Normal   Collection Time    01/09/11  7:21 AM      Component Value Range Comment   TSH 3.283  0.350 - 4.500 (uIU/mL)   CARDIAC PANEL(CRET KIN+CKTOT+MB+TROPI)     Status: Normal   Collection Time   01/09/11  7:21 AM      Component Value Range Comment   Total CK 35  7 - 177 (U/L)    CK, MB 2.1  0.3 - 4.0 (ng/mL)    Troponin I <0.30  <0.30 (ng/mL)    Relative Index RELATIVE INDEX IS INVALID  0.0 - 2.5    HEMOGLOBIN A1C     Status: Normal   Collection Time   01/09/11  7:21 AM      Component Value Range Comment   Hemoglobin A1C 5.5  <5.7 (%)    Mean Plasma Glucose 111  <117 (mg/dL)   LIPID PANEL     Status: Normal   Collection Time   01/09/11  7:40 AM      Component Value Range Comment   Cholesterol 163  0 - 200 (mg/dL)    Triglycerides 146  <150 (mg/dL)    HDL 49  >39 (mg/dL)    Total CHOL/HDL Ratio 3.3      VLDL 29  0 - 40 (mg/dL)    LDL Cholesterol 85  0 - 99 (mg/dL)   VITAMIN B12     Status: Normal   Collection Time   01/09/11  7:41 AM      Component Value Range Comment   Vitamin B-12 310  211 - 911 (pg/mL)   FOLATE     Status: Normal   Collection Time   01/09/11  7:41 AM      Component Value Range Comment   Folate 16.5     IRON AND TIBC     Status: Abnormal   Collection Time   01/09/11  7:41 AM      Component Value Range Comment   Iron 30 (*) 42 - 135 (ug/dL)    TIBC 282  250 - 470 (ug/dL)    Saturation Ratios 11 (*) 20 - 55 (%)    UIBC 252     FERRITIN     Status: Abnormal   Collection Time   01/09/11  7:41 AM      Component Value Range Comment   Ferritin 6 (*) 10 - 291 (ng/mL)   RETICULOCYTES  Status: Normal   Collection Time   01/09/11  7:41 AM      Component Value Range Comment   Retic Ct Pct 1.6  0.4 - 3.1 (%)    RBC. 3.88  3.87 - 5.11 (MIL/uL)    Retic Count, Manual 62.1  19.0 - 186.0 (K/uL)   CARDIAC PANEL(CRET KIN+CKTOT+MB+TROPI)     Status: Normal   Collection Time   01/09/11  3:50 PM      Component Value Range Comment   Total CK 28  7 - 177 (U/L)    CK, MB 1.9  0.3 - 4.0 (ng/mL)     Troponin I <0.30  <0.30 (ng/mL)    Relative Index RELATIVE INDEX IS INVALID  0.0 - 2.5    CARDIAC PANEL(CRET KIN+CKTOT+MB+TROPI)     Status: Normal   Collection Time   01/10/11 12:00 AM      Component Value Range Comment   Total CK 28  7 - 177 (U/L)    CK, MB 1.7  0.3 - 4.0 (ng/mL)    Troponin I <0.30  <0.30 (ng/mL)    Relative Index RELATIVE INDEX IS INVALID  0.0 - 2.5    MAGNESIUM     Status: Normal   Collection Time   01/10/11  5:05 AM      Component Value Range Comment   Magnesium 1.9  1.5 - 2.5 (mg/dL)   PHOSPHORUS     Status: Abnormal   Collection Time   01/10/11  5:05 AM      Component Value Range Comment   Phosphorus 4.7 (*) 2.3 - 4.6 (mg/dL)   BASIC METABOLIC PANEL     Status: Abnormal   Collection Time   01/10/11  5:05 AM      Component Value Range Comment   Sodium 140  135 - 145 (mEq/L)    Potassium 4.3  3.5 - 5.1 (mEq/L) DELTA CHECK NOTED   Chloride 107  96 - 112 (mEq/L)    CO2 26  19 - 32 (mEq/L)    Glucose, Bld 85  70 - 99 (mg/dL)    BUN 26 (*) 6 - 23 (mg/dL)    Creatinine, Ser 0.89  0.50 - 1.10 (mg/dL)    Calcium 8.2 (*) 8.4 - 10.5 (mg/dL)    GFR calc non Af Amer >60  >60 (mL/min)    GFR calc Af Amer >60  >60 (mL/min)   CBC     Status: Abnormal   Collection Time   01/10/11  5:05 AM      Component Value Range Comment   WBC 7.4  4.0 - 10.5 (K/uL)    RBC 3.75 (*) 3.87 - 5.11 (MIL/uL)    Hemoglobin 10.1 (*) 12.0 - 15.0 (g/dL)    HCT 29.7 (*) 36.0 - 46.0 (%)    MCV 79.2  78.0 - 100.0 (fL)    MCH 26.9  26.0 - 34.0 (pg)    MCHC 34.0  30.0 - 36.0 (g/dL)    RDW 14.3  11.5 - 15.5 (%)    Platelets 207  150 - 400 (K/uL)   PROTEIN, URINE, 24 HOUR     Status: Abnormal   Collection Time   01/10/11  9:08 AM      Component Value Range Comment   Urine Total Volume-UPROT 4250      Collection Interval-UPROT 24      Protein, Urine 95      Protein, 24H Urine 4038 (*) 50 - 100 (mg/day)   CREATININE CLEARANCE,  URINE, 24 HOUR     Status: Normal   Collection Time   01/10/11   9:08 AM      Component Value Range Comment   Urine Total Volume-CRCL 4250      Collection Interval-CRCL 24      Creatinine, Urine 24.4      Creatinine 0.89  0.50 - 1.10 (mg/dL)    Creatinine, 24H Ur 1037  700 - 1800 (mg/day)    Creatinine Clearance 81  75 - 115 (mL/min)     Dg Chest 1 View  01/09/2011  *RADIOLOGY REPORT*  Clinical Data: Shortness of breath.  CHEST - 1 VIEW  Comparison: None.  Findings: The lungs are well-aerated.  Mild vascular congestion is noted.  Increased interstitial markings, more prominent on the right, may reflect mild interstitial edema.  There is no evidence of pleural effusion or pneumothorax.  The cardiomediastinal silhouette is borderline normal in size.  No acute osseous abnormalities are seen.  IMPRESSION: Increased interstitial markings, more prominent on the right, may reflect mild interstitial edema; mild vascular congestion noted.  Original Report Authenticated By: Santa Lighter, M.D.    Review of Systems  Constitutional: Negative for fever, chills and malaise/fatigue.  HENT: Positive for sore throat.   Respiratory: Positive for shortness of breath. Negative for cough, hemoptysis, sputum production and wheezing.   Cardiovascular: Positive for orthopnea and leg swelling. Negative for chest pain and palpitations.  Gastrointestinal: Negative for vomiting, abdominal pain and diarrhea.  Genitourinary: Negative for dysuria, urgency, hematuria and flank pain.  Neurological: Negative for weakness.   Blood pressure 112/68, pulse 74, temperature 98 F (36.7 C), temperature source Oral, resp. rate 20, height 5\' 6"  (1.676 m), weight 87.9 kg (193 lb 12.6 oz), last menstrual period 12/19/2010, SpO2 96.00%. Physical Exam  Constitutional: No distress.  HENT:  Head: Normocephalic.  Mouth/Throat: No oropharyngeal exudate.  Eyes: No scleral icterus.  Neck: No JVD present.  Cardiovascular: Normal rate, regular rhythm and normal heart sounds.  Exam reveals no gallop and  no friction rub.   No murmur heard. Respiratory: No respiratory distress. She has no wheezes. She has no rales.  GI: She exhibits no distension. There is no tenderness. There is no rebound.  Musculoskeletal: She exhibits edema.  Skin: Skin is warm. No rash noted. No erythema.    Assessment/Plan: Problem #1 proteinuria her as this moment seems to be nephrotic range she is about 4 g of protein. Patient has leg edema for which she claims has improved and she has also hypoalbuminemia. By definition and has this could be nephrotic syndrome however her her patient cholesterol at this moment seems to be within normal range. The etiology for her proteinuria at this moment not clear but a patient was normotensive and the last 35 years old has the most likely etiology for her proteinuria can't be membranous nephropathy. However secondary cause of her proteinuria as such as her hepatitis B hepatitis C and other her infectious disease at this moment cannot ruled out. Since the patient has history of also sore throat we need to her how to include her streptococcal for throat and the also her IgA nephropathy. Her patient seems to have multiple family's with a history of her her kidney disease at this moment the medial type of proteinuria can't ruled out. Problem #2 anemia this moment seems to be secondary to iron deficiency and anemia as patient has low her ferritin and iron profile.  problem #3 patient wheeze her pH [phone] PCO2 history of  cough 22 hence her she seems of her respiratory alkalosis her her. Problem #4 severe hypoalbuminemia probably related to her proteinuria. Problem #5 history of her pancreatitis seems to be acute related to gallstone presently she is a symptomatic. Problem #6 history insitu cervical ca Problem #7 history of Bel's palsy Recommendation I will do ultrasound of the kidneys will check hepatitis B surface antigen, hepatitis C antibody, ASO titer, complement RPR and ANCA I agree with  ACE inhibitor.  Davelle Anselmi S 01/10/2011, 3:10 PM

## 2011-01-11 LAB — RPR: RPR Ser Ql: NONREACTIVE

## 2011-01-11 LAB — HEPATITIS B SURFACE ANTIGEN: Hepatitis B Surface Ag: NEGATIVE

## 2011-01-11 LAB — HEPATITIS C ANTIBODY: HCV Ab: NEGATIVE

## 2011-01-11 LAB — SEDIMENTATION RATE: Sed Rate: 32 mm/hr — ABNORMAL HIGH (ref 0–22)

## 2011-01-11 LAB — ANTISTREPTOLYSIN O TITER: ASO: 229 IU/mL (ref 0–408)

## 2011-01-11 NOTE — Progress Notes (Signed)
Subjective: Interval History: has no complaint of  shortness of breath or orthopnea. She denies also any nausea or vomiting and she doesn't have any swelling of the legs anymore..  Objective: Vital signs in last 24 hours: Temp:  [97.7 F (36.5 C)-98.7 F (37.1 C)] 97.7 F (36.5 C) (08/25 0637) Pulse Rate:  [67-86] 67  (08/25 0637) Resp:  [18-19] 18  (08/25 0637) BP: (101-137)/(67-89) 130/89 mmHg (08/25 0637) SpO2:  [98 %-100 %] 99 % (08/25 0637) Weight change:   Intake/Output from previous day: 08/24 0701 - 08/25 0700 In: 1015 [P.O.:1015] Out: 1800 [Urine:1800] Intake/Output this shift: I/O this shift: In: 240 [P.O.:240] Out: -   General appearance: alert Resp: clear to auscultation bilaterally Cardio: regular rate and rhythm, S1, S2 normal, no murmur, click, rub or gallop Extremities: extremities normal, atraumatic, no cyanosis or edema  Lab Results:  [facility] 01/10/11 0505 01/09/11 0407  WBC 7.4 17.3*  HGB 10.1* 10.3*  HCT 29.7* 30.2*  PLT 207 232   BMET:  Basename 01/10/11 0908 01/10/11 0505 01/09/11 0407  NA -- 140 137  K -- 4.3 3.4*  CL -- 107 107  CO2 -- 26 22  GLUCOSE -- 85 84  BUN -- 26* 30*  CREATININE 0.89 0.89 --  CALCIUM -- 8.2* 8.7   No results found for this basename: PTH:2 in the last 72 hours Iron Studies:  Basename 01/09/11 0741  IRON 30*  TIBC 282  TRANSFERRIN --  FERRITIN 6*    Studies/Results: US Renal  01/10/2011  *RADIOLOGY REPORT*  Clinical Data: Proteinuria  RENAL/URINARY TRACT ULTRASOUND COMPLETE  Comparison:  Abdomen ultrasound 07/08/2009  Findings:  Right Kidney:  11.2 cm length. Suboptimal image quality secondary to body habitus.  Grossly normal cortical thickness and echogenicity.  No definite mass, hydronephrosis or shadowing calcification.  Left Kidney:  11.1 cm length.  Normal cortical thickness echogenicity.  Less well visualized due to body habitus.  No gross mass, hydronephrosis shadowing calcification.  Bladder:   Incompletely distended, grossly normal morphology. Ureteral jets were not visualized during imaging.  IMPRESSION: No gross renal sonographic abnormality identified.  Original Report Authenticated By: Burnetta Sabin, M.D.    I have reviewed the patient'Vazquez current medications.  Assessment/Plan: Problem number one her proteinuria at this moment seems to be and nephrotic range have. The etiology her as this moment is not clear and. RPR is nonreactive hepatitis B surface antigen is negative hepatitis C antibodies negative and also ASO titer is within normal range. Her ultrasound showed right kidney to be 11.2 in left kidney 11.1. Complement however at this moment seems to be low. At this moment the rule out membranoproliferative glomerulonephritis as she has family history of her renal failure. At this moment lupus and other vasculitic disease also cannot ruled out. Problem #2 anemia at this moment seems to be iron deficiency anemia her H&H is stable. Recommendation have discussed with her about her being a kidney biopsy. We'll check rheumatoid factor sedimentation rate and the also PT and PTT.  We will send  patient for possible kidney biopsy 2 W. along possibly on Monday.   LOS: 2 days   Maria Vazquez 01/11/2011,9:58 AM

## 2011-01-11 NOTE — Plan of Care (Signed)
Problem: Phase I Progression Outcomes Goal: Pain controlled with appropriate interventions Outcome: Not Applicable Date Met:  123456 No c/o pain

## 2011-01-11 NOTE — Progress Notes (Signed)
Subjective: This Hispanic lady was admitted with dyspnea and leg swelling. She has nephrotic range proteinuria and she is being investigated for the etiology of this. Dr Hinda Lenis, nephrology, does not think this is nephrotic syndrome and other etiologies are being investigated, for example infectious or lupus. The patient herself feels much improved and wants to go home.           Physical Exam: Blood pressure 130/89, pulse 67, temperature 97.7 F (36.5 C), temperature source Oral, resp. rate 18, height 5\' 6"  (1.676 m), weight 87.9 kg (193 lb 12.6 oz), last menstrual period 12/19/2010, SpO2 99.00%. She looks systemically well. There is no increased work of breathing. Lung fields are clear. Heart sounds are present and normal without murmurs. There is very little if any leg swelling now.   Investigations: Results for orders placed during the hospital encounter of 01/09/11 (from the past 48 hour(s))  CARDIAC PANEL(CRET KIN+CKTOT+MB+TROPI)     Status: Normal   Collection Time   01/09/11  3:50 PM      Component Value Range Comment   Total CK 28  7 - 177 (U/L)    CK, MB 1.9  0.3 - 4.0 (ng/mL)    Troponin I <0.30  <0.30 (ng/mL)    Relative Index RELATIVE INDEX IS INVALID  0.0 - 2.5    CARDIAC PANEL(CRET KIN+CKTOT+MB+TROPI)     Status: Normal   Collection Time   01/10/11 12:00 AM      Component Value Range Comment   Total CK 28  7 - 177 (U/L)    CK, MB 1.7  0.3 - 4.0 (ng/mL)    Troponin I <0.30  <0.30 (ng/mL)    Relative Index RELATIVE INDEX IS INVALID  0.0 - 2.5    MAGNESIUM     Status: Normal   Collection Time   01/10/11  5:05 AM      Component Value Range Comment   Magnesium 1.9  1.5 - 2.5 (mg/dL)   PHOSPHORUS     Status: Abnormal   Collection Time   01/10/11  5:05 AM      Component Value Range Comment   Phosphorus 4.7 (*) 2.3 - 4.6 (mg/dL)   BASIC METABOLIC PANEL     Status: Abnormal   Collection Time   01/10/11  5:05 AM      Component Value Range Comment   Sodium 140  135 -  145 (mEq/L)    Potassium 4.3  3.5 - 5.1 (mEq/L) DELTA CHECK NOTED   Chloride 107  96 - 112 (mEq/L)    CO2 26  19 - 32 (mEq/L)    Glucose, Bld 85  70 - 99 (mg/dL)    BUN 26 (*) 6 - 23 (mg/dL)    Creatinine, Ser 0.89  0.50 - 1.10 (mg/dL)    Calcium 8.2 (*) 8.4 - 10.5 (mg/dL)    GFR calc non Af Amer >60  >60 (mL/min)    GFR calc Af Amer >60  >60 (mL/min)   CBC     Status: Abnormal   Collection Time   01/10/11  5:05 AM      Component Value Range Comment   WBC 7.4  4.0 - 10.5 (K/uL)    RBC 3.75 (*) 3.87 - 5.11 (MIL/uL)    Hemoglobin 10.1 (*) 12.0 - 15.0 (g/dL)    HCT 29.7 (*) 36.0 - 46.0 (%)    MCV 79.2  78.0 - 100.0 (fL)    MCH 26.9  26.0 - 34.0 (pg)  MCHC 34.0  30.0 - 36.0 (g/dL)    RDW 14.3  11.5 - 15.5 (%)    Platelets 207  150 - 400 (K/uL)   PROTEIN, URINE, 24 HOUR     Status: Abnormal   Collection Time   01/10/11  9:08 AM      Component Value Range Comment   Urine Total Volume-UPROT 4250      Collection Interval-UPROT 24      Protein, Urine 95      Protein, 24H Urine 4038 (*) 50 - 100 (mg/day)   CREATININE CLEARANCE, URINE, 24 HOUR     Status: Normal   Collection Time   01/10/11  9:08 AM      Component Value Range Comment   Urine Total Volume-CRCL 4250      Collection Interval-CRCL 24      Creatinine, Urine 24.4      Creatinine 0.89  0.50 - 1.10 (mg/dL)    Creatinine, 24H Ur 1037  700 - 1800 (mg/day)    Creatinine Clearance 81  75 - 115 (mL/min)   ANTISTREPTOLYSIN O TITER     Status: Normal   Collection Time   01/10/11  5:00 PM      Component Value Range Comment   ASO 229  0 - 408 (IU/mL)   HEPATITIS B SURFACE ANTIGEN     Status: Normal   Collection Time   01/10/11  5:00 PM      Component Value Range Comment   Hepatitis B Surface Ag NEGATIVE  NEGATIVE    HEPATITIS C ANTIBODY     Status: Normal   Collection Time   01/10/11  5:00 PM      Component Value Range Comment   HCV Ab NEGATIVE  NEGATIVE    C3 COMPLEMENT     Status: Abnormal   Collection Time   01/10/11   5:00 PM      Component Value Range Comment   C3 Complement 60 (*) 90 - 180 (mg/dL)   C4 COMPLEMENT     Status: Abnormal   Collection Time   01/10/11  5:00 PM      Component Value Range Comment   Complement C4, Body Fluid 4 (*) 10 - 40 (mg/dL)   RPR     Status: Normal   Collection Time   01/10/11  5:00 PM      Component Value Range Comment   RPR NON REACTIVE  NON REACTIVE       US Renal  01/10/2011  *RADIOLOGY REPORT*  Clinical Data: Proteinuria  RENAL/URINARY TRACT ULTRASOUND COMPLETE  Comparison:  Abdomen ultrasound 07/08/2009  Findings:  Right Kidney:  11.2 cm length. Suboptimal image quality secondary to body habitus.  Grossly normal cortical thickness and echogenicity.  No definite mass, hydronephrosis or shadowing calcification.  Left Kidney:  11.1 cm length.  Normal cortical thickness echogenicity.  Less well visualized due to body habitus.  No gross mass, hydronephrosis shadowing calcification.  Bladder:  Incompletely distended, grossly normal morphology. Ureteral jets were not visualized during imaging.  IMPRESSION: No gross renal sonographic abnormality identified.  Original Report Authenticated By: Burnetta Sabin, M.D.      Medications: I have reviewed the patient's current medications.  Impression: 1. Nephrotic range proteinuria of unclear etiology. 2. Iron deficiency anemia.     Plan: 1. Await blood results. 2. Consider discharge home tomorrow once workup is complete. She may need a kidney biopsy in the future.     LOS: 2 days   [place] C  01/11/2011, 9:26 AM

## 2011-01-12 LAB — COMPREHENSIVE METABOLIC PANEL
CO2: 25 mEq/L (ref 19–32)
Calcium: 8.3 mg/dL — ABNORMAL LOW (ref 8.4–10.5)
Creatinine, Ser: 0.95 mg/dL (ref 0.50–1.10)
GFR calc Af Amer: >60 mL/min (ref >60)
GFR calc non Af Amer: >60 mL/min (ref >60)
Glucose, Bld: 95 mg/dL (ref 70–99)
Total Bilirubin: 0.4 mg/dL (ref 0.3–1.2)

## 2011-01-12 LAB — CBC
HCT: 32.6 % — ABNORMAL LOW (ref 36.0–46.0)
Hemoglobin: 11.1 g/dL — ABNORMAL LOW (ref 12.0–15.0)
MCH: 26.9 pg (ref 26.0–34.0)
MCV: 79.1 fL (ref 78.0–100.0)
RBC: 4.12 MIL/uL (ref 3.87–5.11)

## 2011-01-12 MED ORDER — ENOXAPARIN SODIUM 40 MG/0.4ML ~~LOC~~ SOLN
40.0000 mg | SUBCUTANEOUS | Status: DC
  Administered 2011-01-13: 40 mg via SUBCUTANEOUS
  Filled 2011-01-12: qty 0.4

## 2011-01-12 MED ORDER — POTASSIUM CHLORIDE CRYS ER 20 MEQ PO TBCR
40.0000 meq | EXTENDED_RELEASE_TABLET | Freq: Every day | ORAL | Status: DC
  Filled 2011-01-12: qty 2

## 2011-01-12 MED ORDER — ENALAPRIL MALEATE 5 MG PO TABS
5.0000 mg | ORAL_TABLET | Freq: Every day | ORAL | Status: DC
  Administered 2011-01-12 – 2011-01-14 (×2): 5 mg via ORAL
  Filled 2011-01-12 (×2): qty 1

## 2011-01-12 NOTE — Progress Notes (Signed)
Subjective: This Hispanic lady was admitted with dyspnea and leg swelling. She has nephrotic range proteinuria and she is being investigated for the etiology of this. Dr Hinda Lenis, nephrology, does not think this is nephrotic syndrome and other etiologies are being investigated, for example infectious or lupus. She had a couple of episodes of vomiting with headache but she feels well this morning. Her dyspnea is significantly improved as is her leg swelling. Dr Hinda Lenis wishes her to have a kidney biopsy. Other investigations so far for infectious etiologies are negative including ASO titer and hepatitis B and C. titers.          Physical Exam: Blood pressure 134/86, pulse 67, temperature 98.2 F (36.8 C), temperature source Oral, resp. rate 18, height 5\' 6"  (1.676 m), weight 84.414 kg (186 lb 1.6 oz), last menstrual period 12/19/2010, SpO2 99.00%. She looks systemically well. There is no increased work of breathing. Lung fields are clear. Heart sounds are present and normal without murmurs. There is very little if any leg swelling now.   Investigations: Results for orders placed during the hospital encounter of 01/09/11 (from the past 48 hour(s))  PROTEIN, URINE, 24 HOUR     Status: Abnormal   Collection Time   01/10/11  9:08 AM      Component Value Range Comment   Urine Total Volume-UPROT 4250      Collection Interval-UPROT 24      Protein, Urine 95      Protein, 24H Urine 4038 (*) 50 - 100 (mg/day)   CREATININE CLEARANCE, URINE, 24 HOUR     Status: Normal   Collection Time   01/10/11  9:08 AM      Component Value Range Comment   Urine Total Volume-CRCL 4250      Collection Interval-CRCL 24      Creatinine, Urine 24.4      Creatinine 0.89  0.50 - 1.10 (mg/dL)    Creatinine, 24H Ur 1037  700 - 1800 (mg/day)    Creatinine Clearance 81  75 - 115 (mL/min)   ANTISTREPTOLYSIN O TITER     Status: Normal   Collection Time   01/10/11  5:00 PM      Component Value Range Comment   ASO 229   0 - 408 (IU/mL)   HEPATITIS B SURFACE ANTIGEN     Status: Normal   Collection Time   01/10/11  5:00 PM      Component Value Range Comment   Hepatitis B Surface Ag NEGATIVE  NEGATIVE    HEPATITIS C ANTIBODY     Status: Normal   Collection Time   01/10/11  5:00 PM      Component Value Range Comment   HCV Ab NEGATIVE  NEGATIVE    C3 COMPLEMENT     Status: Abnormal   Collection Time   01/10/11  5:00 PM      Component Value Range Comment   C3 Complement 60 (*) 90 - 180 (mg/dL)   C4 COMPLEMENT     Status: Abnormal   Collection Time   01/10/11  5:00 PM      Component Value Range Comment   Complement C4, Body Fluid 4 (*) 10 - 40 (mg/dL)   RPR     Status: Normal   Collection Time   01/10/11  5:00 PM      Component Value Range Comment   RPR NON REACTIVE  NON REACTIVE    SEDIMENTATION RATE     Status: Abnormal   Collection Time  01/11/11 11:44 AM      Component Value Range Comment   Sed Rate 32 (*) 0 - 22 (mm/hr)   CBC     Status: Abnormal   Collection Time   01/12/11  5:30 AM      Component Value Range Comment   WBC 7.4  4.0 - 10.5 (K/uL)    RBC 4.12  3.87 - 5.11 (MIL/uL)    Hemoglobin 11.1 (*) 12.0 - 15.0 (g/dL)    HCT 32.6 (*) 36.0 - 46.0 (%)    MCV 79.1  78.0 - 100.0 (fL)    MCH 26.9  26.0 - 34.0 (pg)    MCHC 34.0  30.0 - 36.0 (g/dL)    RDW 13.7  11.5 - 15.5 (%)    Platelets 222  150 - 400 (K/uL)   COMPREHENSIVE METABOLIC PANEL     Status: Abnormal   Collection Time   01/12/11  5:30 AM      Component Value Range Comment   Sodium 135  135 - 145 (mEq/L)    Potassium 4.9  3.5 - 5.1 (mEq/L)    Chloride 104  96 - 112 (mEq/L)    CO2 25  19 - 32 (mEq/L)    Glucose, Bld 95  70 - 99 (mg/dL)    BUN 26 (*) 6 - 23 (mg/dL)    Creatinine, Ser 0.95  0.50 - 1.10 (mg/dL)    Calcium 8.3 (*) 8.4 - 10.5 (mg/dL)    Total Protein 5.8 (*) 6.0 - 8.3 (g/dL)    Albumin 2.5 (*) 3.5 - 5.2 (g/dL)    AST 14  0 - 37 (U/L)    ALT 14  0 - 35 (U/L)    Alkaline Phosphatase 67  39 - 117 (U/L)    Total  Bilirubin 0.4  0.3 - 1.2 (mg/dL)    GFR calc non Af Amer >60  >60 (mL/min)    GFR calc Af Amer >60  >60 (mL/min)      US Renal  01/10/2011  *RADIOLOGY REPORT*  Clinical Data: Proteinuria  RENAL/URINARY TRACT ULTRASOUND COMPLETE  Comparison:  Abdomen ultrasound 07/08/2009  Findings:  Right Kidney:  11.2 cm length. Suboptimal image quality secondary to body habitus.  Grossly normal cortical thickness and echogenicity.  No definite mass, hydronephrosis or shadowing calcification.  Left Kidney:  11.1 cm length.  Normal cortical thickness echogenicity.  Less well visualized due to body habitus.  No gross mass, hydronephrosis shadowing calcification.  Bladder:  Incompletely distended, grossly normal morphology. Ureteral jets were not visualized during imaging.  IMPRESSION: No gross renal sonographic abnormality identified.  Original Report Authenticated By: Burnetta Sabin, M.D.      Medications: I have reviewed the patient's current medications.  Impression: 1. Nephrotic range proteinuria of unclear etiology. 2. Iron deficiency anemia.     Plan: 1. Await blood results. 2. I will try to arrange for her to have a kidney biopsy, per Dr Hinda Lenis, tomorrow at [facility].     LOS: 3 days   [place] C 01/12/2011, 7:38 AM

## 2011-01-12 NOTE — Progress Notes (Signed)
Subjective: Interval History: has no complaint of difficulty breathing or orthopnea. Overall her patient seems to be doing reasonably good..  Objective: Vital signs in last 24 hours: Temp:  [97.9 F (36.6 C)-98.4 F (36.9 C)] 98.2 F (36.8 C) (08/26 0509) Pulse Rate:  [67-90] 67  (08/26 0509) Resp:  [18] 18  (08/26 0509) BP: (94-134)/(52-86) 134/86 mmHg (08/26 0509) SpO2:  [98 %-99 %] 99 % (08/26 0509) Weight:  [84.414 kg (186 lb 1.6 oz)-87 kg (191 lb 12.8 oz)] 186 lb 1.6 oz (84.414 kg) (08/26 0509) Weight change:   Intake/Output from previous day: 08/25 0701 - 08/26 0700 In: 760 [P.O.:760] Out: -  Intake/Output this shift:    General appearance: alert Resp: clear to auscultation bilaterally GI: soft, non-tender; bowel sounds normal; no masses,  no organomegaly Extremities: extremities normal, atraumatic, no cyanosis or edema  Lab Results:  Basename 01/12/11 0530 01/10/11 0505  WBC 7.4 7.4  HGB 11.1* 10.1*  HCT 32.6* 29.7*  PLT 222 207   BMET:  Basename 01/12/11 0530 01/10/11 0908 01/10/11 0505  NA 135 -- 140  K 4.9 -- 4.3  CL 104 -- 107  CO2 25 -- 26  GLUCOSE 95 -- 85  BUN 26* -- 26*  CREATININE 0.95 0.89 --  CALCIUM 8.3* -- 8.2*   No results found for this basename: PTH:2 in the last 72 hours Iron Studies: No results found for this basename: IRON,TIBC,TRANSFERRIN,FERRITIN in the last 72 hours  Studies/Results: US Renal  01/10/2011  *RADIOLOGY REPORT*  Clinical Data: Proteinuria  RENAL/URINARY TRACT ULTRASOUND COMPLETE  Comparison:  Abdomen ultrasound 07/08/2009  Findings:  Right Kidney:  11.2 cm length. Suboptimal image quality secondary to body habitus.  Grossly normal cortical thickness and echogenicity.  No definite mass, hydronephrosis or shadowing calcification.  Left Kidney:  11.1 cm length.  Normal cortical thickness echogenicity.  Less well visualized due to body habitus.  No gross mass, hydronephrosis shadowing calcification.  Bladder:  Incompletely  distended, grossly normal morphology. Ureteral jets were not visualized during imaging.  IMPRESSION: No gross renal sonographic abnormality identified.  Original Report Authenticated By: Burnetta Sabin, M.D.    I have reviewed the patient's current medications.  Assessment/Plan: Problem #1 proteinuria nephrotic range her presently with the low complement. Etiology not clear however her lupus nephritis versus membrano proliferative glomerulonephritis<MPGN> need to be put into the differential diagnosis. Problem #2 anemia iron deficiency H&H is stable Problem #3 swelling of the leg and shortness of breath possibly secondary to nephrotic range proteinuria patient has improved. Recommendation we'll put her on the ACE inhibitor We'll make arrangements for patient to get the kidney biopsy her. We'll check her PT and PTT in the morning.   LOS: 3 days   [place] Shadowens S 01/12/2011,8:13 AM

## 2011-01-13 ENCOUNTER — Other Ambulatory Visit: Payer: Self-pay | Admitting: Interventional Radiology

## 2011-01-13 ENCOUNTER — Inpatient Hospital Stay (HOSPITAL_COMMUNITY): Payer: Medicaid Other

## 2011-01-13 ENCOUNTER — Ambulatory Visit (HOSPITAL_COMMUNITY): Admission: RE | Admit: 2011-01-13 | Payer: Self-pay | Source: Ambulatory Visit

## 2011-01-13 ENCOUNTER — Ambulatory Visit (HOSPITAL_COMMUNITY)
Admit: 2011-01-13 | Discharge: 2011-01-13 | Disposition: A | Discharge status: Home / Self Care | Payer: Self-pay | Source: Ambulatory Visit | Attending: Nephrology | Admitting: Nephrology

## 2011-01-13 DIAGNOSIS — R809 Proteinuria, unspecified: Secondary | ICD-10-CM | POA: Insufficient documentation

## 2011-01-13 DIAGNOSIS — R0602 Shortness of breath: Secondary | ICD-10-CM | POA: Insufficient documentation

## 2011-01-13 DIAGNOSIS — Z8541 Personal history of malignant neoplasm of cervix uteri: Secondary | ICD-10-CM | POA: Insufficient documentation

## 2011-01-13 DIAGNOSIS — D649 Anemia, unspecified: Secondary | ICD-10-CM | POA: Insufficient documentation

## 2011-01-13 DIAGNOSIS — R609 Edema, unspecified: Secondary | ICD-10-CM | POA: Insufficient documentation

## 2011-01-13 LAB — BASIC METABOLIC PANEL
BUN: 29 mg/dL — ABNORMAL HIGH (ref 6–23)
CO2: 26 mEq/L (ref 19–32)
Calcium: 8.4 mg/dL (ref 8.4–10.5)
Creatinine, Ser: 1.08 mg/dL (ref 0.50–1.10)
GFR calc Af Amer: >60 mL/min (ref >60)

## 2011-01-13 LAB — PROTIME-INR
INR: 1 (ref 0.00–1.49)
Prothrombin Time: 13.4 seconds (ref 11.6–15.2)

## 2011-01-13 MED ORDER — SODIUM CHLORIDE 0.9 % IJ SOLN
INTRAMUSCULAR | Status: AC
  Administered 2011-01-13: 09:00:00
  Filled 2011-01-13: qty 10

## 2011-01-13 NOTE — Progress Notes (Signed)
Subjective: Interval History: has no complaint of  no abdominal pain.  Objective: Vital signs in last 24 hours: Temp:  [97.2 F (36.2 C)-98.3 F (36.8 C)] 98 F (36.7 C) (08/27 0545) Pulse Rate:  [76-95] 76  (08/27 0545) Resp:  [16-18] 16  (08/27 0545) BP: (115-139)/(81-85) 115/84 mmHg (08/27 0545) SpO2:  [98 %-99 %] 99 % (08/27 0545) Weight:  [83.553 kg (184 lb 3.2 oz)] 184 lb 3.2 oz (83.553 kg) (08/27 0545) Weight change: -3.447 kg (-7 lb 9.6 oz)  Intake/Output from previous day: 08/26 0701 - 08/27 0700 In: 620 [P.O.:620] Out: -  Intake/Output this shift:    General appearance: alert Resp: clear to auscultation bilaterally Cardio: regular rate and rhythm, S1, S2 normal, no murmur, click, rub or gallop Extremities: extremities normal, atraumatic, no cyanosis or edema  Lab Results:  Basename 01/12/11 0530  WBC 7.4  HGB 11.1*  HCT 32.6*  PLT 222   BMET:  Basename 01/13/11 0452 01/12/11 0530  NA 134* 135  K 4.8 4.9  CL 101 104  CO2 26 25  GLUCOSE 98 95  BUN 29* 26*  CREATININE 1.08 0.95  CALCIUM 8.4 8.3*   No results found for this basename: PTH:2 in the last 72 hours Iron Studies: No results found for this basename: IRON,TIBC,TRANSFERRIN,FERRITIN in the last 72 hours  Studies/Results: No results found.  I have reviewed the patient's current medications. Scheduled:   . enalapril  5 mg Oral Daily  . enoxaparin  40 mg Subcutaneous Q24H  . ferrous sulfate  325 mg Oral TID WC  . furosemide  40 mg Oral Daily  . DISCONTD: enoxaparin  40 mg Subcutaneous Daily  . DISCONTD: potassium chloride  40 mEq Oral Daily    Assessment/Plan: Problem #1 proteinuria in nephrotic range etiology as this moment her is not clear. ANA and ANCA  is pending. Her presently patient is on ACE inhibitor her. Problem #2 anemia mild Problem #3 history of her her CHF patient on her diuretics no sign of fluid overload Problem #4 anemia iron deficiency presently her patient is on iron  supplement. Recommendation for kidney biopsy and the house he patient her as an outpatient once she is discharged. This is end of her followup thank you   LOS: 4 days   [place] Hari S 01/13/2011,7:50 AM

## 2011-01-13 NOTE — Progress Notes (Signed)
Subjective: This Hispanic lady was admitted with dyspnea and leg swelling. She has nephrotic range proteinuria and she is being investigated for the etiology of this. Dr Hinda Lenis, nephrology, does not think this is nephrotic syndrome and other etiologies are being investigated, for example infectious or lupus.  Other investigations so far for infectious etiologies are negative including ASO titer and hepatitis B and C. titers. She had a kidney biopsy today and is doing well post-procedure.         Physical Exam: Blood pressure 115/84, pulse 76, temperature 98 F (36.7 C), temperature source Oral, resp. rate 16, height 5\' 6"  (1.676 m), weight 83.553 kg (184 lb 3.2 oz), last menstrual period 12/19/2010, SpO2 99.00%. She looks systemically well. There is no increased work of breathing. Lung fields are clear. Heart sounds are present and normal without murmurs. There is very little if any leg swelling now.   Investigations: Results for orders placed during the hospital encounter of 01/09/11 (from the past 48 hour(s))  CBC     Status: Abnormal   Collection Time   01/12/11  5:30 AM      Component Value Range Comment   WBC 7.4  4.0 - 10.5 (K/uL)    RBC 4.12  3.87 - 5.11 (MIL/uL)    Hemoglobin 11.1 (*) 12.0 - 15.0 (g/dL)    HCT 32.6 (*) 36.0 - 46.0 (%)    MCV 79.1  78.0 - 100.0 (fL)    MCH 26.9  26.0 - 34.0 (pg)    MCHC 34.0  30.0 - 36.0 (g/dL)    RDW 13.7  11.5 - 15.5 (%)    Platelets 222  150 - 400 (K/uL)   COMPREHENSIVE METABOLIC PANEL     Status: Abnormal   Collection Time   01/12/11  5:30 AM      Component Value Range Comment   Sodium 135  135 - 145 (mEq/L)    Potassium 4.9  3.5 - 5.1 (mEq/L)    Chloride 104  96 - 112 (mEq/L)    CO2 25  19 - 32 (mEq/L)    Glucose, Bld 95  70 - 99 (mg/dL)    BUN 26 (*) 6 - 23 (mg/dL)    Creatinine, Ser 0.95  0.50 - 1.10 (mg/dL)    Calcium 8.3 (*) 8.4 - 10.5 (mg/dL)    Total Protein 5.8 (*) 6.0 - 8.3 (g/dL)    Albumin 2.5 (*) 3.5 - 5.2 (g/dL)      AST 14  0 - 37 (U/L)    ALT 14  0 - 35 (U/L)    Alkaline Phosphatase 67  39 - 117 (U/L)    Total Bilirubin 0.4  0.3 - 1.2 (mg/dL)    GFR calc non Af Amer >60  >60 (mL/min)    GFR calc Af Amer >60  >60 (mL/min)   APTT     Status: Normal   Collection Time   01/13/11  4:52 AM      Component Value Range Comment   aPTT 34  24 - 37 (seconds)   PROTIME-INR     Status: Normal   Collection Time   01/13/11  4:52 AM      Component Value Range Comment   Prothrombin Time 13.4  11.6 - 15.2 (seconds)    INR 1.00  0.00 - [id number]    BASIC METABOLIC PANEL     Status: Abnormal   Collection Time   01/13/11  4:52 AM      Component Value Range  Comment   Sodium 134 (*) 135 - 145 (mEq/L)    Potassium 4.8  3.5 - 5.1 (mEq/L)    Chloride 101  96 - 112 (mEq/L)    CO2 26  19 - 32 (mEq/L)    Glucose, Bld 98  70 - 99 (mg/dL)    BUN 29 (*) 6 - 23 (mg/dL)    Creatinine, Ser 1.08  0.50 - 1.10 (mg/dL)    Calcium 8.4  8.4 - 10.5 (mg/dL)    GFR calc non Af Amer 58 (*) >60 (mL/min)    GFR calc Af Amer >60  >60 (mL/min)      No results found.    Medications: I have reviewed the patient's current medications.  Impression: 1. Nephrotic range proteinuria of unclear etiology. 2. Iron deficiency anemia.     Plan: 1. Await blood results.Check double stranded DNA 2. Home tomorrow if stable     LOS: 4 days   [place] C 01/13/2011, 4:53 PM

## 2011-01-14 ENCOUNTER — Ambulatory Visit (HOSPITAL_COMMUNITY): Payer: Self-pay

## 2011-01-14 LAB — BASIC METABOLIC PANEL
CO2: 24 mEq/L (ref 19–32)
Calcium: 8.5 mg/dL (ref 8.4–10.5)
Chloride: 104 mEq/L (ref 96–112)
Glucose, Bld: 96 mg/dL (ref 70–99)
Sodium: 135 mEq/L (ref 135–145)

## 2011-01-14 MED ORDER — ENALAPRIL MALEATE 5 MG PO TABS: 5.0000 mg | ORAL_TABLET | Freq: Every day | ORAL | Status: DC

## 2011-01-14 MED ORDER — FERROUS SULFATE 325 (65 FE) MG PO TABS: 325.0000 mg | ORAL_TABLET | Freq: Three times a day (TID) | ORAL | Status: DC

## 2011-01-14 MED ORDER — FUROSEMIDE 40 MG PO TABS: 40.0000 mg | ORAL_TABLET | Freq: Every day | ORAL | Status: DC

## 2011-01-14 NOTE — Discharge Summary (Signed)
Physician Discharge Summary  Patient ID: Maria Vazquez MRN: [id number] DOB/AGE: April 01, 1976 35 y.o.  Admit date: 01/09/2011 Discharge date: 01/14/2011    Discharge Diagnoses:  1. Nephrotic range proteinuria, unclear etiology. 2. Iron deficiency anemia.   Current Discharge Medication List    START taking these medications   Details  enalapril (VASOTEC) 5 MG tablet Take 1 tablet (5 mg total) by mouth daily. Qty: 30 tablet, Refills: 0    ferrous sulfate 325 (65 FE) MG tablet Take 1 tablet (325 mg total) by mouth 3 (three) times daily with meals. Qty: 90 tablet, Refills: 0    furosemide (LASIX) 40 MG tablet Take 1 tablet (40 mg total) by mouth daily. Qty: 30 tablet, Refills: 0      STOP taking these medications     predniSONE (DELTASONE) 10 MG tablet         Discharged Condition: Stable and improved.    Consults: Nephrology, Dr Hinda Lenis.  Significant Diagnostic Studies: Dg Chest 1 View  01/09/2011  *RADIOLOGY REPORT*  Clinical Data: Shortness of breath.  CHEST - 1 VIEW  Comparison: None.  Findings: The lungs are well-aerated.  Mild vascular congestion is noted.  Increased interstitial markings, more prominent on the right, may reflect mild interstitial edema.  There is no evidence of pleural effusion or pneumothorax.  The cardiomediastinal silhouette is borderline normal in size.  No acute osseous abnormalities are seen.  IMPRESSION: Increased interstitial markings, more prominent on the right, may reflect mild interstitial edema; mild vascular congestion noted.  Original Report Authenticated By: Santa Lighter, M.D.   US Renal  01/10/2011  *RADIOLOGY REPORT*  Clinical Data: Proteinuria  RENAL/URINARY TRACT ULTRASOUND COMPLETE  Comparison:  Abdomen ultrasound 07/08/2009  Findings:  Right Kidney:  11.2 cm length. Suboptimal image quality secondary to body habitus.  Grossly normal cortical thickness and echogenicity.  No definite mass, hydronephrosis or shadowing  calcification.  Left Kidney:  11.1 cm length.  Normal cortical thickness echogenicity.  Less well visualized due to body habitus.  No gross mass, hydronephrosis shadowing calcification.  Bladder:  Incompletely distended, grossly normal morphology. Ureteral jets were not visualized during imaging.  IMPRESSION: No gross renal sonographic abnormality identified.  Original Report Authenticated By: Burnetta Sabin, M.D.   ORDERING Karlyn Agee PERFORMING Aneta [facility] SONOGRAPHER Kwong(Billy) Boyd Kerbs, RDCS cc:  -------------------------------------------------------------------- LV EF: 55% - 60%  -------------------------------------------------------------------- Indications: CHF - 428.0.  -------------------------------------------------------------------- History: PMH: Dyspnea. Risk factors: Bilateral swelling of feet. Facial swelling. Proteinuria. Anemia.  -------------------------------------------------------------------- Study Conclusions  - Left ventricle: The cavity size was normal. Wall thickness was normal. Systolic function was normal. The estimated ejection fraction was in the range of 55% to 60%. Wall motion was normal; there were no regional wall motion abnormalities. Left ventricular diastolic function parameters were normal. - Mitral valve: Mild regurgitation. - Left atrium: The atrium was at the upper limits of normal in size. - Tricuspid valve: Trivial regurgitation. - Pulmonary arteries: PA peak pressure: 14mm Hg (S). - Pericardium, extracardiac: There was no pericardial effusion. Transthoracic echocardiography. M-mode, complete 2D, spectral Doppler, and color Doppler. Height: Height: 167.6cm. Height: 66in. Weight: Weight: 88.9kg. Weight: 195.6lb. Body mass index: BMI: 31.6kg/m^2. Body surface area: BSA: 1.65m^2. Blood pressure: 133/87. Patient status: Inpatient. Location: Bedside.   Lab Results: Results for orders placed during the hospital encounter of  01/09/11 (from the past 48 hour(s))  APTT     Status: Normal   Collection Time   01/13/11  4:52 AM      Component Value  Range Comment   aPTT 34  24 - 37 (seconds)   PROTIME-INR     Status: Normal   Collection Time   01/13/11  4:52 AM      Component Value Range Comment   Prothrombin Time 13.4  11.6 - 15.2 (seconds)    INR 1.00  0.00 - [id number]    BASIC METABOLIC PANEL     Status: Abnormal   Collection Time   01/13/11  4:52 AM      Component Value Range Comment   Sodium 134 (*) 135 - 145 (mEq/L)    Potassium 4.8  3.5 - 5.1 (mEq/L)    Chloride 101  96 - 112 (mEq/L)    CO2 26  19 - 32 (mEq/L)    Glucose, Bld 98  70 - 99 (mg/dL)    BUN 29 (*) 6 - 23 (mg/dL)    Creatinine, Ser 1.08  0.50 - 1.10 (mg/dL)    Calcium 8.4  8.4 - 10.5 (mg/dL)    GFR calc non Af Amer 58 (*) >60 (mL/min)    GFR calc Af Amer >60  >60 (mL/min)   BASIC METABOLIC PANEL     Status: Abnormal   Collection Time   01/14/11  5:30 AM      Component Value Range Comment   Sodium 135  135 - 145 (mEq/L)    Potassium 5.0  3.5 - 5.1 (mEq/L)    Chloride 104  96 - 112 (mEq/L)    CO2 24  19 - 32 (mEq/L)    Glucose, Bld 96  70 - 99 (mg/dL)    BUN 30 (*) 6 - 23 (mg/dL)    Creatinine, Ser 0.99  0.50 - 1.10 (mg/dL)    Calcium 8.5  8.4 - 10.5 (mg/dL)    GFR calc non Af Amer >60  >60 (mL/min)    GFR calc Af Amer >60  >60 (mL/min)     ANA positive, homogeneous pattern, 1:320 titer. C3 complement 60. Total complement 12. C4, 4.  RPR negative. Hepatitis B surface antigen negative. Hepatitis C antibody negative. 24-hour urinary protein: 4038.                                                                                                                60                    12          Hospital Course: [name] was admitted with shortness of breath. Please see initial history and physical examination done by Dr. Karlyn Agee. It was felt that the etiology of her cough or dyspnea and leg swelling  that she complained of was unlikely to be cardiac in nature. A 24-hour urinary protein was done and this was significantly elevated in the nephrotic range. Dr Hinda Lenis, nephrologist, saw the patient in consultation. He agreed that this was not cardiac in nature but renal in nature and although he did not think the patient had nephrotic syndrome, he  wondered whether there was an infectious cause of her nephrotic range proteinuria or possibly an autoimmune process such as SLE. A renal ultrasound was unremarkable. An echocardiogram was also unremarkable. She has been treated symptomatically with intravenous Lasix and also has been put on ACE inhibitor. She has improved with her symptoms. She did undergo a left kidney biopsy yesterday and post procedure has done well without any significant complaints. Today she feels well and is keen to go home.  Discharge Exam: Blood pressure 117/81, pulse 81, temperature 98.1 F (36.7 C), temperature source Oral, resp. rate 18, height 5\' 6"  (1.676 m), weight 83.915 kg (185 lb), last menstrual period 12/19/2010, SpO2 97.00%. She looks systemically well. Heart sounds are present and normal. Lung fields are clear. There is no overt minimal pitting edema of her legs now. She is alert and orientated without any focal neurological signs.  Disposition: Home. She will follow with Dr Hinda Lenis.  Discharge Orders    Future Orders Please Complete By Expires   Diet - low sodium heart healthy      Increase activity slowly         Follow-up Information    Follow up with [facility] in 3 weeks. (biopsy follow up)    Contact information:   [address] [place] [phone]          Signed: Doree Albee 01/14/2011, 9:16 AM

## 2011-01-14 NOTE — Progress Notes (Signed)
Subjective: Interval History: has no complaint of nausea or vomiting her. Overall she seems to be feeling better. And that there is no hematuria in her  urine..  Objective: Vital signs in last 24 hours: Temp:  [97.8 F (36.6 C)-98.1 F (36.7 C)] 98.1 F (36.7 C) (08/28 0528) Pulse Rate:  [69-81] 81  (08/28 0528) Resp:  [16-18] 18  (08/28 0528) BP: (117-131)/(76-86) 117/81 mmHg (08/28 0528) SpO2:  [97 %-100 %] 97 % (08/28 0528) Weight:  [83.915 kg (185 lb)] 185 lb (83.915 kg) (08/28 0639) Weight change: 0.363 kg (12.8 oz)  Intake/Output from previous day:   Intake/Output this shift:    General appearance: alert Resp: clear to auscultation bilaterally Cardio: regular rate and rhythm, S1, S2 normal, no murmur, click, rub or gallop GI: soft, non-tender; bowel sounds normal; no masses,  no organomegaly Extremities: extremities normal, atraumatic, no cyanosis or edema  Lab Results:  Basename 01/12/11 0530  WBC 7.4  HGB 11.1*  HCT 32.6*  PLT 222   BMET:  Basename 01/14/11 0530 01/13/11 0452  NA 135 134*  K 5.0 4.8  CL 104 101  CO2 24 26  GLUCOSE 96 98  BUN 30* 29*  CREATININE 0.99 1.08  CALCIUM 8.5 8.4   No results found for this basename: PTH:2 in the last 72 hours Iron Studies: No results found for this basename: IRON,TIBC,TRANSFERRIN,FERRITIN in the last 72 hours  Studies/Results: No results found.  I have reviewed the patient's current medications.  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 Problem #3 history of anemia iron deficiency she is on iron supplement her. Recommendation we'll check her anti-double-stranded DNA and also anti-Smith antibody. And will follow her her as outpatient..     LOS: 5 days   Tyhesha Dutson S 01/14/2011,

## 2011-01-15 LAB — ANTI-SMITH ANTIBODY: ENA SM Ab Ser-aCnc: 1 AU/mL (ref <30)

## 2011-01-15 NOTE — Progress Notes (Signed)
Discharge instructions given on medications,and follow up appointments,patient verbalized understanding.Prescriptions sent with patient.No c/o pain or discomfort noted.Accompanied by staff to an awaiting vechicle.

## 2011-01-18 LAB — CRYOGLOBULIN

## 2011-01-23 ENCOUNTER — Encounter: Payer: Self-pay | Admitting: Internal Medicine

## 2011-01-23 ENCOUNTER — Encounter (HOSPITAL_COMMUNITY): Payer: Self-pay | Admitting: Emergency Medicine

## 2011-01-23 ENCOUNTER — Emergency Department (HOSPITAL_COMMUNITY): Payer: Self-pay

## 2011-01-23 ENCOUNTER — Emergency Department (HOSPITAL_COMMUNITY)
Admission: EM | Admit: 2011-01-23 | Discharge: 2011-01-23 | Disposition: A | Discharge status: Home / Self Care | Payer: Self-pay | Attending: Emergency Medicine | Admitting: Emergency Medicine

## 2011-01-23 DIAGNOSIS — Z8541 Personal history of malignant neoplasm of cervix uteri: Secondary | ICD-10-CM | POA: Insufficient documentation

## 2011-01-23 DIAGNOSIS — R809 Proteinuria, unspecified: Secondary | ICD-10-CM | POA: Insufficient documentation

## 2011-01-23 DIAGNOSIS — R609 Edema, unspecified: Secondary | ICD-10-CM | POA: Insufficient documentation

## 2011-01-23 HISTORY — DX: Proteinuria, unspecified: R80.9

## 2011-01-23 LAB — URINALYSIS, ROUTINE W REFLEX MICROSCOPIC
Bilirubin Urine: NEGATIVE
Ketones, ur: NEGATIVE mg/dL
Leukocytes, UA: NEGATIVE
Nitrite: NEGATIVE
Protein, ur: 100 mg/dL — AB
pH: 6 (ref 5.0–8.0)

## 2011-01-23 LAB — DIFFERENTIAL
Eosinophils Absolute: 0.2 10*3/uL (ref 0.0–0.7)
Lymphocytes Relative: 27 % (ref 12–46)
Lymphs Abs: 2.5 10*3/uL (ref 0.7–4.0)
Monocytes Relative: 6 % (ref 3–12)
Neutro Abs: 6.2 10*3/uL (ref 1.7–7.7)
Neutrophils Relative %: 65 % (ref 43–77)

## 2011-01-23 LAB — COMPREHENSIVE METABOLIC PANEL
ALT: 23 U/L (ref 0–35)
Alkaline Phosphatase: 82 U/L (ref 39–117)
BUN: 32 mg/dL — ABNORMAL HIGH (ref 6–23)
CO2: 23 mEq/L (ref 19–32)
Chloride: 104 mEq/L (ref 96–112)
GFR calc Af Amer: 58 mL/min — ABNORMAL LOW (ref >60)
GFR calc non Af Amer: 48 mL/min — ABNORMAL LOW (ref >60)
Glucose, Bld: 99 mg/dL (ref 70–99)
Potassium: 4.3 mEq/L (ref 3.5–5.1)
Sodium: 136 mEq/L (ref 135–145)
Total Bilirubin: 0.2 mg/dL — ABNORMAL LOW (ref 0.3–1.2)

## 2011-01-23 LAB — URINE MICROSCOPIC-ADD ON

## 2011-01-23 LAB — CBC
Hemoglobin: 10.7 g/dL — ABNORMAL LOW (ref 12.0–15.0)
MCH: 26.8 pg (ref 26.0–34.0)
Platelets: 242 10*3/uL (ref 150–400)
RBC: 3.99 MIL/uL (ref 3.87–5.11)
WBC: 9.6 10*3/uL (ref 4.0–10.5)

## 2011-01-23 MED ORDER — FUROSEMIDE 40 MG PO TABS: 40.0000 mg | ORAL_TABLET | Freq: Two times a day (BID) | ORAL | Status: DC

## 2011-01-23 NOTE — ED Provider Notes (Signed)
History     CSN: [id number] Arrival date & time: 01/23/2011 12:41 AM  Chief Complaint  Patient presents with  . Shortness of Breath  . Numbness   HPI Pt was recently admitted for SOB and swelling, found to have nephrotic range proteinuria and hypoalbuminemia. She had extensive workup including investigation of rheumatologic/autoimmune causes as well as renal biopsy. She is still awaiting those results. She was discharged about a week ago with Lasix and had been doing well until today when she began to feel SOB again. Symptoms associated with face and LE edema. Face swelling has caused a numb feeling. Her SOB is mild, no CP, fever or cough. She has been taking her medications as directed. Has been losing weight since discharge.  Past Medical History  Diagnosis Date  . Gallstone pancreatitis Feb 2011  . Cervix carcinoma in situ Mar 2011    Past Surgical History  Procedure Date  . Appendectomy   . Cervical cone biopsy   . Cholecystectomy, laparoscopic   . Cesarean section     Family History  Problem Relation Age of Onset  . Diabetes Mother   . Diabetes Brother   . Diabetes Brother   . Diabetes Maternal Grandmother     History  Substance Use Topics  . Smoking status: Never Smoker   . Smokeless tobacco: Not on file  . Alcohol Use: No    OB History    Grav Para Term Preterm Abortions TAB SAB Ect Mult Living   4 3   1  1   3       Review of Systems All other systems reviewed and are negative except as noted in HPI.   Physical Exam  BP 139/101  Pulse 90  Temp(Src) 98.4 F (36.9 C) (Oral)  Resp 18  Ht 5\' 7"  (1.702 m)  Wt 185 lb (83.915 kg)  BMI 28.97 kg/m2  SpO2 99%  LMP 01/14/2011  Physical Exam  Nursing note and vitals reviewed. Constitutional: She is oriented to person, place, and time. She appears well-developed and well-nourished.  HENT:  Head: Normocephalic and atraumatic.       Mild soft tissue swelling of face   Eyes: EOM are normal. Pupils are equal,  round, and reactive to light.  Neck: Normal range of motion. Neck supple.  Cardiovascular: Normal rate, normal heart sounds and intact distal pulses.   Pulmonary/Chest: Effort normal and breath sounds normal.  Abdominal: Bowel sounds are normal. She exhibits no distension. There is no tenderness.  Musculoskeletal: Normal range of motion. She exhibits edema (trace edema of LE from mid tibia down, no hand or UE edema). She exhibits no tenderness.  Neurological: She is alert and oriented to person, place, and time. No cranial nerve deficit.  Skin: Skin is warm and dry. No rash noted.  Psychiatric: She has a normal mood and affect.    ED Course  Procedures  MDM Pt is well appearing, only mild edema. She had marked proteinuria on a 24hr protein done while in the hospital. Will check her UA and serum protein levels today and investigate for pulm edema.     2:31 AM Pt now states feeling some mild dizziness, room spinning associated with nausea. Will check orthostatics, but symptoms sound vertiginous in etiology. Discussed case with Dr. Megan Salon on call for hospitalist who is familiar with the patient. Her labs today are not significantly changed from her recent admission. Slight increase in creatinine. She is 10lbs lighter than during admission, so seems to be  diuresing well. CXR is clear. She is not scheduled to see Nephology for two more weeks. He agrees with plan to increase Lasix to BID and encourage sooner followup with Dr. Hinda Lenis as an outpatient. Does not need to be readmitted at this time.   Charles B. Karle Starch, MD 01/24/11 [phone]

## 2011-01-23 NOTE — ED Notes (Signed)
Patient states she is short of breath x 1 hour, states has had right sided facial numbness since 1000 on 01/22/11 and also c/o bilateral feet swelling.

## 2011-01-27 NOTE — Progress Notes (Signed)
Encounter addended by: Harriet Pho on: 01/27/2011 11:17 AM<BR>     Documentation filed: Flowsheet VN

## 2011-02-11 ENCOUNTER — Emergency Department (HOSPITAL_COMMUNITY): Payer: Medicaid Other

## 2011-02-11 ENCOUNTER — Encounter (HOSPITAL_COMMUNITY): Payer: Self-pay | Admitting: Emergency Medicine

## 2011-02-11 ENCOUNTER — Emergency Department (HOSPITAL_COMMUNITY)
Admission: EM | Admit: 2011-02-11 | Discharge: 2011-02-11 | Disposition: A | Discharge status: Home / Self Care | Payer: Medicaid Other | Attending: Emergency Medicine | Admitting: Emergency Medicine

## 2011-02-11 ENCOUNTER — Other Ambulatory Visit: Payer: Self-pay

## 2011-02-11 DIAGNOSIS — Z8541 Personal history of malignant neoplasm of cervix uteri: Secondary | ICD-10-CM | POA: Insufficient documentation

## 2011-02-11 DIAGNOSIS — R10816 Epigastric abdominal tenderness: Secondary | ICD-10-CM | POA: Insufficient documentation

## 2011-02-11 DIAGNOSIS — R112 Nausea with vomiting, unspecified: Secondary | ICD-10-CM | POA: Insufficient documentation

## 2011-02-11 DIAGNOSIS — R109 Unspecified abdominal pain: Secondary | ICD-10-CM | POA: Insufficient documentation

## 2011-02-11 LAB — BASIC METABOLIC PANEL
BUN: 46 mg/dL — ABNORMAL HIGH (ref 6–23)
Chloride: 110 mEq/L (ref 96–112)
Creatinine, Ser: 2 mg/dL — ABNORMAL HIGH (ref 0.50–1.10)
Glucose, Bld: 93 mg/dL (ref 70–99)
Potassium: 5 mEq/L (ref 3.5–5.1)

## 2011-02-11 LAB — CBC
HCT: 29 % — ABNORMAL LOW (ref 36.0–46.0)
Hemoglobin: 9.9 g/dL — ABNORMAL LOW (ref 12.0–15.0)
MCHC: 34.1 g/dL (ref 30.0–36.0)
MCV: 78.2 fL (ref 78.0–100.0)
RDW: 14.4 % (ref 11.5–15.5)
WBC: 8.4 10*3/uL (ref 4.0–10.5)

## 2011-02-11 LAB — HEPATIC FUNCTION PANEL
ALT: 14 U/L (ref 0–35)
AST: 13 U/L (ref 0–37)
Albumin: 2 g/dL — ABNORMAL LOW (ref 3.5–5.2)

## 2011-02-11 LAB — CARDIAC PANEL(CRET KIN+CKTOT+MB+TROPI)
Relative Index: INVALID (ref 0.0–2.5)
Troponin I: <0.3 ng/mL (ref <0.30)

## 2011-02-11 MED ORDER — ASPIRIN 325 MG PO TABS
325.0000 mg | ORAL_TABLET | ORAL | Status: AC
  Administered 2011-02-11: 325 mg via ORAL
  Filled 2011-02-11: qty 1

## 2011-02-11 MED ORDER — HYOSCYAMINE SULFATE 0.125 MG SL SUBL: 0.1250 mg | SUBLINGUAL_TABLET | SUBLINGUAL | Status: AC | PRN

## 2011-02-11 MED ORDER — GI COCKTAIL ~~LOC~~
30.0000 mL | Freq: Once | ORAL | Status: AC
  Administered 2011-02-11: 30 mL via ORAL
  Filled 2011-02-11: qty 30

## 2011-02-11 MED ORDER — ONDANSETRON HCL 4 MG/2ML IJ SOLN
4.0000 mg | Freq: Once | INTRAMUSCULAR | Status: AC
  Administered 2011-02-11: 4 mg via INTRAVENOUS
  Filled 2011-02-11: qty 2

## 2011-02-11 NOTE — ED Notes (Signed)
Patient c/o left sided chest pain that started about an hour ago.

## 2011-02-11 NOTE — ED Provider Notes (Addendum)
History     CSN: [id number] Arrival date & time: 02/11/2011  3:27 AM  Chief Complaint  Patient presents with  . Chest Pain    HPI  (Consider location/radiation/quality/duration/timing/severity/associated sxs/prior treatment)  The history is provided by the patient and medical records.   the patient is a 35 year old female with a history of gallstone pancreatitis, appendectomy and cholecystectomy and cesarean section.  She presents to the emergency department with abdominal pain since around 2 AM this morning.  She also had nausea and vomiting.  She denies cough, fevers, chills, diarrhea, or urinary tract symptoms.  Past Medical History  Diagnosis Date  . Gallstone pancreatitis Feb 2011  . Cervix carcinoma in situ Mar 2011  . Proteinuria - cause not known     Past Surgical History  Procedure Date  . Appendectomy   . Cervical cone biopsy   . Cholecystectomy, laparoscopic   . Cesarean section     Family History  Problem Relation Age of Onset  . Diabetes Mother   . Diabetes Brother   . Diabetes Brother   . Diabetes Maternal Grandmother     History  Substance Use Topics  . Smoking status: Never Smoker   . Smokeless tobacco: Not on file  . Alcohol Use: No    OB History    Grav Para Term Preterm Abortions TAB SAB Ect Mult Living   4 3   1  1   3       Review of Systems  Review of Systems  Constitutional: Negative for fever and chills.  HENT: Negative for congestion and rhinorrhea.   Eyes: Negative for redness.  Respiratory: Negative for cough and shortness of breath.   Cardiovascular: Negative for chest pain.  Gastrointestinal: Positive for nausea, vomiting and abdominal pain. Negative for diarrhea.  Genitourinary: Negative for dysuria.  Skin: Negative for color change and rash.  Neurological: Negative for headaches.    Allergies  Review of patient's allergies indicates no known allergies.  Home Medications   Current Outpatient Rx  Name Route Sig  Dispense Refill  . ENALAPRIL MALEATE 5 MG PO TABS Oral Take 1 tablet (5 mg total) by mouth daily. 30 tablet 0  . FERROUS SULFATE 325 (65 FE) MG PO TABS Oral Take 1 tablet (325 mg total) by mouth 3 (three) times daily with meals. 90 tablet 0  . FUROSEMIDE 40 MG PO TABS Oral Take 1 tablet (40 mg total) by mouth 2 (two) times daily. 30 tablet 0    Physical Exam    BP 152/86  Pulse 80  Resp 22  Ht 5\' 7"  (1.702 m)  Wt 185 lb (83.915 kg)  BMI 28.97 kg/m2  SpO2 100%  LMP 01/14/2011  Physical Exam  Constitutional: She is oriented to person, place, and time. She appears well-developed and well-nourished.  HENT:  Head: Normocephalic and atraumatic.  Eyes: Conjunctivae are normal. Pupils are equal, round, and reactive to light. No scleral icterus.  Neck: Normal range of motion. Neck supple.  Cardiovascular: Normal rate and regular rhythm.   Pulmonary/Chest: Effort normal and breath sounds normal. She has no rales.  Abdominal: Soft. Bowel sounds are normal. There is tenderness. There is no rebound and no guarding.       Epigastric tenderness  Musculoskeletal: Normal range of motion.  Neurological: She is alert and oriented to person, place, and time.  Skin: Skin is warm and dry.  Psychiatric: She has a normal mood and affect.    ED Course  Procedures (including critical  care time) 5:56 AM Pain resolved  ED ECG REPORT   Date: 02/11/2011  EKG Time: 03:29 Rate: 79  Rhythm: normal sinus rhythm,  normal EKG, normal sinus rhythm  Axis: normal  Intervals:none  ST&T Change:  none  Narrative Interpretation:normal           Labs Reviewed  CBC - Abnormal; Notable for the following:    RBC 3.71 (*)    Hemoglobin 9.9 (*)    HCT 29.0 (*)    All other components within normal limits  BASIC METABOLIC PANEL - Abnormal; Notable for the following:    CO2 16 (*)    BUN 46 (*)    Creatinine, Ser 2.00 (*)    Calcium 7.7 (*)    GFR calc non Af Amer 29 (*)    GFR calc Af Amer 35 (*)     All other components within normal limits  CARDIAC PANEL(CRET KIN+CKTOT+MB+TROPI)  BRAIN NATRIURETIC PEPTIDE      No diagnosis found.   MDM Abdominal pain        Elmer Picker, MD 02/11/11 [id number]  Elmer Picker, MD 02/11/11 [phone]

## 2011-02-11 NOTE — ED Notes (Signed)
Pt left the er stating no needs

## 2011-03-02 ENCOUNTER — Inpatient Hospital Stay (HOSPITAL_COMMUNITY): Payer: Medicaid Other

## 2011-03-02 ENCOUNTER — Inpatient Hospital Stay (HOSPITAL_COMMUNITY)
Admission: EM | Admit: 2011-03-02 | Discharge: 2011-03-05 | Disposition: A | Discharge status: Other Acute Inpatient Hospital | Payer: Medicaid Other | Source: Home / Self Care | Attending: Family Medicine | Admitting: Family Medicine

## 2011-03-02 ENCOUNTER — Other Ambulatory Visit: Payer: Self-pay

## 2011-03-02 ENCOUNTER — Encounter (HOSPITAL_COMMUNITY): Payer: Self-pay | Admitting: *Deleted

## 2011-03-02 ENCOUNTER — Emergency Department (HOSPITAL_COMMUNITY): Payer: Medicaid Other

## 2011-03-02 ENCOUNTER — Other Ambulatory Visit (HOSPITAL_COMMUNITY): Payer: Self-pay

## 2011-03-02 DIAGNOSIS — N19 Unspecified kidney failure: Secondary | ICD-10-CM

## 2011-03-02 DIAGNOSIS — M329 Systemic lupus erythematosus, unspecified: Secondary | ICD-10-CM | POA: Diagnosis present

## 2011-03-02 DIAGNOSIS — R894 Abnormal immunological findings in specimens from other organs, systems and tissues: Secondary | ICD-10-CM | POA: Diagnosis present

## 2011-03-02 DIAGNOSIS — R079 Chest pain, unspecified: Secondary | ICD-10-CM | POA: Diagnosis not present

## 2011-03-02 DIAGNOSIS — R0602 Shortness of breath: Secondary | ICD-10-CM | POA: Diagnosis present

## 2011-03-02 DIAGNOSIS — N058 Unspecified nephritic syndrome with other morphologic changes: Secondary | ICD-10-CM | POA: Diagnosis present

## 2011-03-02 DIAGNOSIS — M7989 Other specified soft tissue disorders: Secondary | ICD-10-CM | POA: Diagnosis present

## 2011-03-02 DIAGNOSIS — J189 Pneumonia, unspecified organism: Secondary | ICD-10-CM | POA: Diagnosis present

## 2011-03-02 DIAGNOSIS — N186 End stage renal disease: Secondary | ICD-10-CM | POA: Diagnosis not present

## 2011-03-02 DIAGNOSIS — N179 Acute kidney failure, unspecified: Secondary | ICD-10-CM | POA: Diagnosis present

## 2011-03-02 DIAGNOSIS — E875 Hyperkalemia: Secondary | ICD-10-CM | POA: Diagnosis present

## 2011-03-02 DIAGNOSIS — K859 Acute pancreatitis without necrosis or infection, unspecified: Secondary | ICD-10-CM | POA: Diagnosis not present

## 2011-03-02 DIAGNOSIS — E872 Acidosis, unspecified: Secondary | ICD-10-CM | POA: Diagnosis present

## 2011-03-02 DIAGNOSIS — D638 Anemia in other chronic diseases classified elsewhere: Secondary | ICD-10-CM | POA: Diagnosis present

## 2011-03-02 DIAGNOSIS — I160 Hypertensive urgency: Secondary | ICD-10-CM | POA: Diagnosis not present

## 2011-03-02 DIAGNOSIS — R112 Nausea with vomiting, unspecified: Secondary | ICD-10-CM

## 2011-03-02 DIAGNOSIS — N2581 Secondary hyperparathyroidism of renal origin: Secondary | ICD-10-CM | POA: Diagnosis present

## 2011-03-02 DIAGNOSIS — J9600 Acute respiratory failure, unspecified whether with hypoxia or hypercapnia: Secondary | ICD-10-CM | POA: Diagnosis not present

## 2011-03-02 DIAGNOSIS — E871 Hypo-osmolality and hyponatremia: Secondary | ICD-10-CM | POA: Diagnosis present

## 2011-03-02 DIAGNOSIS — I120 Hypertensive chronic kidney disease with stage 5 chronic kidney disease or end stage renal disease: Secondary | ICD-10-CM | POA: Diagnosis not present

## 2011-03-02 DIAGNOSIS — E8779 Other fluid overload: Secondary | ICD-10-CM | POA: Diagnosis not present

## 2011-03-02 DIAGNOSIS — K219 Gastro-esophageal reflux disease without esophagitis: Secondary | ICD-10-CM | POA: Diagnosis not present

## 2011-03-02 DIAGNOSIS — R220 Localized swelling, mass and lump, head: Secondary | ICD-10-CM | POA: Diagnosis present

## 2011-03-02 DIAGNOSIS — E785 Hyperlipidemia, unspecified: Secondary | ICD-10-CM | POA: Diagnosis present

## 2011-03-02 DIAGNOSIS — D6959 Other secondary thrombocytopenia: Secondary | ICD-10-CM | POA: Diagnosis present

## 2011-03-02 HISTORY — DX: Chronic kidney disease, unspecified: N18.9

## 2011-03-02 HISTORY — DX: Glomerular disease in systemic lupus erythematosus: M32.14

## 2011-03-02 HISTORY — DX: Essential (primary) hypertension: I10

## 2011-03-02 HISTORY — DX: Pneumonia, unspecified organism: J18.9

## 2011-03-02 HISTORY — DX: Reserved for concepts with insufficient information to code with codable children: IMO0002

## 2011-03-02 HISTORY — DX: Systemic lupus erythematosus, unspecified: M32.9

## 2011-03-02 HISTORY — DX: Heart failure, unspecified: I50.9

## 2011-03-02 HISTORY — DX: Disorder of kidney and ureter, unspecified: N28.9

## 2011-03-02 LAB — URINALYSIS, ROUTINE W REFLEX MICROSCOPIC
Ketones, ur: NEGATIVE mg/dL
Leukocytes, UA: NEGATIVE
Nitrite: NEGATIVE
Specific Gravity, Urine: >1.03 — ABNORMAL HIGH (ref 1.005–1.030)
Urobilinogen, UA: 0.2 mg/dL (ref 0.0–1.0)
pH: 6.5 (ref 5.0–8.0)

## 2011-03-02 LAB — URINE MICROSCOPIC-ADD ON

## 2011-03-02 LAB — DIFFERENTIAL
Basophils Absolute: 0 10*3/uL (ref 0.0–0.1)
Basophils Relative: 0 % (ref 0–1)
Eosinophils Absolute: 0.1 10*3/uL (ref 0.0–0.7)
Eosinophils Relative: 1 % (ref 0–5)
Lymphs Abs: 1.2 10*3/uL (ref 0.7–4.0)
Neutrophils Relative %: 79 % — ABNORMAL HIGH (ref 43–77)

## 2011-03-02 LAB — COMPREHENSIVE METABOLIC PANEL
ALT: 11 U/L (ref 0–35)
ALT: 19 U/L (ref 0–35)
AST: 11 U/L (ref 0–37)
AST: 26 U/L (ref 0–37)
Albumin: 1.8 g/dL — ABNORMAL LOW (ref 3.5–5.2)
Alkaline Phosphatase: 140 U/L — ABNORMAL HIGH (ref 39–117)
CO2: 15 mEq/L — ABNORMAL LOW (ref 19–32)
Calcium: 7.7 mg/dL — ABNORMAL LOW (ref 8.4–10.5)
Calcium: 7.5 mg/dL — ABNORMAL LOW (ref 8.4–10.5)
Creatinine, Ser: 8.8 mg/dL — ABNORMAL HIGH (ref 0.50–1.10)
GFR calc Af Amer: 6 mL/min — ABNORMAL LOW (ref >90)
GFR calc non Af Amer: 5 mL/min — ABNORMAL LOW (ref >90)
Potassium: 5.6 mEq/L — ABNORMAL HIGH (ref 3.5–5.1)
Sodium: 134 mEq/L — ABNORMAL LOW (ref 135–145)
Sodium: 132 mEq/L — ABNORMAL LOW (ref 135–145)
Total Protein: 5.6 g/dL — ABNORMAL LOW (ref 6.0–8.3)
Total Protein: 5.5 g/dL — ABNORMAL LOW (ref 6.0–8.3)

## 2011-03-02 LAB — CARDIAC PANEL(CRET KIN+CKTOT+MB+TROPI)
Relative Index: INVALID (ref 0.0–2.5)
Relative Index: INVALID (ref 0.0–2.5)
Total CK: 45 U/L (ref 7–177)
Total CK: 50 U/L (ref 7–177)
Troponin I: <0.3 ng/mL (ref <0.30)

## 2011-03-02 LAB — CBC
MCH: 26.6 pg (ref 26.0–34.0)
MCHC: 34.2 g/dL (ref 30.0–36.0)
Platelets: 131 10*3/uL — ABNORMAL LOW (ref 150–400)
RBC: 3.42 MIL/uL — ABNORMAL LOW (ref 3.87–5.11)
RDW: 14.3 % (ref 11.5–15.5)

## 2011-03-02 LAB — MRSA PCR SCREENING: MRSA by PCR: NEGATIVE

## 2011-03-02 LAB — PROTIME-INR: INR: 1.07 (ref 0.00–1.49)

## 2011-03-02 LAB — PHOSPHORUS: Phosphorus: 11.7 mg/dL — ABNORMAL HIGH (ref 2.3–4.6)

## 2011-03-02 MED ORDER — HYDRALAZINE HCL 20 MG/ML IJ SOLN
INTRAMUSCULAR | Status: AC
  Filled 2011-03-02: qty 1

## 2011-03-02 MED ORDER — PREDNISONE 20 MG PO TABS
60.0000 mg | ORAL_TABLET | Freq: Every day | ORAL | Status: DC
  Administered 2011-03-02: 60 mg via ORAL
  Filled 2011-03-02: qty 3

## 2011-03-02 MED ORDER — PREDNISONE 10 MG PO TABS: 10.0000 mg | ORAL_TABLET | Freq: Every day | ORAL | Status: DC

## 2011-03-02 MED ORDER — HEPARIN SODIUM (PORCINE) 5000 UNIT/ML IJ SOLN
5000.0000 [IU] | Freq: Three times a day (TID) | INTRAMUSCULAR | Status: DC
  Administered 2011-03-02 – 2011-03-05 (×9): 5000 [IU] via SUBCUTANEOUS
  Filled 2011-03-02 (×9): qty 1

## 2011-03-02 MED ORDER — INFLUENZA VIRUS VACC SPLIT PF IM SUSP
0.5000 mL | Freq: Once | INTRAMUSCULAR | Status: DC
  Filled 2011-03-02: qty 0.5

## 2011-03-02 MED ORDER — ONDANSETRON HCL 4 MG PO TABS: 4.0000 mg | ORAL_TABLET | Freq: Four times a day (QID) | ORAL | Status: DC | PRN

## 2011-03-02 MED ORDER — PREDNISONE 20 MG PO TABS: 60.0000 mg | ORAL_TABLET | Freq: Every day | ORAL | Status: DC

## 2011-03-02 MED ORDER — LABETALOL HCL 5 MG/ML IV SOLN
10.0000 mg | INTRAVENOUS | Status: DC | PRN
  Administered 2011-03-02: 10 mg via INTRAVENOUS
  Filled 2011-03-02 (×3): qty 4

## 2011-03-02 MED ORDER — METOPROLOL TARTRATE 25 MG PO TABS
25.0000 mg | ORAL_TABLET | Freq: Two times a day (BID) | ORAL | Status: DC
  Administered 2011-03-02 – 2011-03-05 (×6): 25 mg via ORAL
  Filled 2011-03-02 (×6): qty 1

## 2011-03-02 MED ORDER — MOXIFLOXACIN HCL IN NACL 400 MG/250ML IV SOLN
INTRAVENOUS | Status: AC
  Filled 2011-03-02: qty 250

## 2011-03-02 MED ORDER — SODIUM CHLORIDE 0.9 % IV BOLUS (SEPSIS)
1000.0000 mL | Freq: Once | INTRAVENOUS | Status: AC
  Administered 2011-03-02: 1000 mL via INTRAVENOUS

## 2011-03-02 MED ORDER — TECHNETIUM TO 99M ALBUMIN AGGREGATED
5.0000 | Freq: Once | INTRAVENOUS | Status: AC | PRN
  Administered 2011-03-02: 5 via INTRAVENOUS

## 2011-03-02 MED ORDER — SODIUM CHLORIDE 0.9 % IJ SOLN
INTRAMUSCULAR | Status: AC
  Filled 2011-03-02: qty 10

## 2011-03-02 MED ORDER — FUROSEMIDE 10 MG/ML IJ SOLN
200.0000 mg | Freq: Two times a day (BID) | INTRAVENOUS | Status: DC
  Administered 2011-03-02 – 2011-03-05 (×7): 200 mg via INTRAVENOUS
  Filled 2011-03-02 (×9): qty 20

## 2011-03-02 MED ORDER — OXYCODONE HCL 5 MG PO TABS
5.0000 mg | ORAL_TABLET | ORAL | Status: DC | PRN
  Administered 2011-03-03 – 2011-03-05 (×3): 5 mg via ORAL
  Filled 2011-03-02 (×3): qty 1

## 2011-03-02 MED ORDER — ALBUMIN HUMAN 25 % IV SOLN
50.0000 g | Freq: Two times a day (BID) | INTRAVENOUS | Status: AC
Start: 2011-03-02 — End: 2011-03-04
  Administered 2011-03-02 – 2011-03-04 (×4): 50 g via INTRAVENOUS
  Filled 2011-03-02 (×4): qty 200

## 2011-03-02 MED ORDER — ONDANSETRON HCL 4 MG/2ML IJ SOLN
4.0000 mg | Freq: Four times a day (QID) | INTRAMUSCULAR | Status: DC | PRN
  Administered 2011-03-02 – 2011-03-05 (×4): 4 mg via INTRAVENOUS
  Filled 2011-03-02 (×5): qty 2

## 2011-03-02 MED ORDER — HYDROMORPHONE HCL 2 MG/ML IJ SOLN
2.0000 mg | INTRAMUSCULAR | Status: DC | PRN
  Administered 2011-03-03: 2 mg via INTRAVENOUS
  Filled 2011-03-02: qty 2

## 2011-03-02 MED ORDER — HYDROMORPHONE HCL 1 MG/ML IJ SOLN
INTRAMUSCULAR | Status: AC
  Administered 2011-03-02: 2 mg
  Filled 2011-03-02: qty 2

## 2011-03-02 MED ORDER — NITROGLYCERIN IN D5W 200-5 MCG/ML-% IV SOLN
INTRAVENOUS | Status: AC
  Administered 2011-03-02: 5 ug/kg/min
  Filled 2011-03-02: qty 250

## 2011-03-02 MED ORDER — MOXIFLOXACIN HCL IN NACL 400 MG/250ML IV SOLN
400.0000 mg | INTRAVENOUS | Status: DC
  Administered 2011-03-03 – 2011-03-05 (×2): 400 mg via INTRAVENOUS
  Filled 2011-03-02 (×4): qty 250

## 2011-03-02 MED ORDER — SODIUM CHLORIDE 0.9 % IJ SOLN
INTRAMUSCULAR | Status: AC
  Filled 2011-03-02: qty 30

## 2011-03-02 MED ORDER — SODIUM CHLORIDE 0.45 % IV SOLN
INTRAVENOUS | Status: DC
  Administered 2011-03-02 – 2011-03-03 (×2): via INTRAVENOUS

## 2011-03-02 MED ORDER — CLONIDINE HCL 0.1 MG PO TABS
0.1000 mg | ORAL_TABLET | Freq: Two times a day (BID) | ORAL | Status: DC
  Administered 2011-03-02 – 2011-03-05 (×6): 0.1 mg via ORAL
  Filled 2011-03-02 (×7): qty 1

## 2011-03-02 MED ORDER — NITROGLYCERIN 0.4 MG SL SUBL
0.4000 mg | SUBLINGUAL_TABLET | SUBLINGUAL | Status: DC | PRN
  Administered 2011-03-02 – 2011-03-03 (×4): 0.4 mg via SUBLINGUAL
  Filled 2011-03-02: qty 25

## 2011-03-02 MED ORDER — SODIUM POLYSTYRENE SULFONATE 15 GM/60ML PO SUSP
45.0000 g | Freq: Once | ORAL | Status: AC
  Administered 2011-03-02: 45 g via ORAL
  Filled 2011-03-02: qty 180

## 2011-03-02 MED ORDER — NITROGLYCERIN 0.4 MG SL SUBL
SUBLINGUAL_TABLET | SUBLINGUAL | Status: AC
  Filled 2011-03-02: qty 25

## 2011-03-02 MED ORDER — HYDRALAZINE HCL 20 MG/ML IJ SOLN
10.0000 mg | INTRAMUSCULAR | Status: DC | PRN
  Administered 2011-03-02: 10 mg via INTRAVENOUS

## 2011-03-02 MED ORDER — XENON XE 133 GAS
10.0000 | GAS_FOR_INHALATION | Freq: Once | RESPIRATORY_TRACT | Status: AC | PRN
  Administered 2011-03-02: 11.2 via RESPIRATORY_TRACT

## 2011-03-02 MED ORDER — ONDANSETRON HCL 4 MG/2ML IJ SOLN
4.0000 mg | Freq: Once | INTRAMUSCULAR | Status: AC
  Administered 2011-03-02: 4 mg via INTRAVENOUS
  Filled 2011-03-02: qty 2

## 2011-03-02 MED ORDER — PNEUMOCOCCAL VAC POLYVALENT 25 MCG/0.5ML IJ INJ
0.5000 mL | INJECTION | INTRAMUSCULAR | Status: DC
  Filled 2011-03-02: qty 0.5

## 2011-03-02 MED ORDER — MORPHINE SULFATE 4 MG/ML IJ SOLN
4.0000 mg | Freq: Once | INTRAMUSCULAR | Status: AC
  Administered 2011-03-02: 4 mg via INTRAVENOUS
  Filled 2011-03-02: qty 1

## 2011-03-02 NOTE — ED Notes (Signed)
Pt states vomiting and diarrhea began Tuesday. Denies pain. States she last vomited yesterday and vomits whenever she eats.

## 2011-03-02 NOTE — Progress Notes (Signed)
Writer called to room by other RN stating pt having chest pain radiating down left arm.  Upon assessing pt, pt in distress rating pain 10 out 10 scale at chest, left arm, chest and difficulty breathing. Pt was sweating and flushed.  BP was elevated at 188/37 and HR 125. Nitro was given x1. O2 was placed via [place] at 2 lpm. Stat Hydralizine was given per MD and diluadid as well per order.  Stat EKG was done showed sins tach.  Dr. Darrick Meigs was paged and arrived shortly after to assess pt.  Chest pain was not relieved after 1 nitro, 2nd nitro was given with slight results, BP was 172/121 and HR 122.  Cold cloth applied to head pt stating that her whole body was hot.  Diluadid did relieve pain to left arm and head pain rated 8 out of 10. CN called to get bed for ICU transfer.

## 2011-03-02 NOTE — Progress Notes (Signed)
PT TO XRAY FOR V/Q SCAN ACCOMPAINED BY RN AND PT'S SISTER-IN-LAW WHO IS TRANSLATING FOR PT

## 2011-03-02 NOTE — ED Provider Notes (Signed)
History   This chart was scribed for Maria Essex, MD by Carolyne Littles. The patient was seen in room APA04/APA04 and the patient's care was started at 9:55AM.   CSN: [id number] Arrival date & time: 03/02/2011  9:27 AM  Chief Complaint  Patient presents with  . Abdominal Pain    (Consider location/radiation/quality/duration/timing/severity/associated sxs/prior treatment) The history is limited by a language barrier.   Marquisia Fravel is a 35 y.o. female who presents to the Emergency Department complaining of constant upper abdominal pain onset 4 days ago and persistent since with associated nausea, vomiting approximately 3x per day for the past 4 days and diarrhea for the past 2 days and fever which only occurs at night. Notes she has experienced similar abdominal pain previously. Patient describes emesis as brown and green. Denies hematemesis, dysuria, vaginal bleeding, vaginal discharge. Patient with h/o lupus, hypertension and gallstone pancreatitis. Denies h/o abdominal surgery.  Past Medical History  Diagnosis Date  . Gallstone pancreatitis Feb 2011  . Cervix carcinoma in situ Mar 2011  . Proteinuria - cause not known   . Hypertension   . Lupus     Past Surgical History  Procedure Date  . Appendectomy   . Cervical cone biopsy   . Cholecystectomy, laparoscopic   . Cesarean section     Family History  Problem Relation Age of Onset  . Diabetes Mother   . Diabetes Brother   . Diabetes Brother   . Diabetes Maternal Grandmother     History  Substance Use Topics  . Smoking status: Never Smoker   . Smokeless tobacco: Not on file  . Alcohol Use: No    OB History    Grav Para Term Preterm Abortions TAB SAB Ect Mult Living   4 3   1  1   3       Review of Systems 10 Systems reviewed and are negative for acute change except as noted in the HPI.  Allergies  Review of patient's allergies indicates no known allergies.  Home Medications   Current  Outpatient Rx  Name Route Sig Dispense Refill  . ENALAPRIL MALEATE 5 MG PO TABS Oral Take 1 tablet (5 mg total) by mouth daily. 30 tablet 0  . FERROUS SULFATE 325 (65 FE) MG PO TABS Oral Take 1 tablet (325 mg total) by mouth 3 (three) times daily with meals. 90 tablet 0  . FUROSEMIDE 40 MG PO TABS Oral Take 1 tablet (40 mg total) by mouth 2 (two) times daily. 30 tablet 0    BP 139/82  Pulse 52  Temp(Src) 98 F (36.7 C) (Axillary)  Resp 22  Ht 5\' 4"  (1.626 m)  Wt 201 lb (91.173 kg)  BMI 34.50 kg/m2  SpO2 100%  LMP 03/02/2011  Physical Exam  Nursing note and vitals reviewed. Constitutional: She is oriented to person, place, and time. She appears well-developed and well-nourished. No distress.  HENT:  Head: Normocephalic and atraumatic.  Eyes: EOM are normal. Pupils are equal, round, and reactive to light.  Neck: Neck supple. No tracheal deviation present.  Cardiovascular: Normal rate.   Pulmonary/Chest: Effort normal. No respiratory distress.  Abdominal: Soft. She exhibits no distension. There is tenderness (diffuse but worse in RUQ). There is guarding. There is no CVA tenderness.  Musculoskeletal: Normal range of motion. She exhibits no edema.  Neurological: She is alert and oriented to person, place, and time. No sensory deficit.  Skin: Skin is warm and dry.  Psychiatric: She has a normal mood and  affect. Her behavior is normal.    ED Course  Procedures (including critical care time)  DIAGNOSTIC STUDIES: Oxygen Saturation is 100% on room air, normal by my interpretation.    COORDINATION OF CARE: 11:03AM- Consult to nephrology complete. Patient case explained and discussed. Nephrologist notes there is no need for patient to be transferred and suggests patient be admitted at [facility] for further evaluation and treatment of acute renal failure.  11:43AM- Consult complete with hospitalist. Patient case explained and discussed. Informed of previous consult with Dr. Lowanda Foster.  Hospitalist agrees to admit patient for further evaluation and treatment.    Labs Reviewed  URINALYSIS, ROUTINE W REFLEX MICROSCOPIC - Abnormal; Notable for the following:    Appearance CLOUDY (*)    Specific Gravity, Urine >1.030 (*)    Hgb urine dipstick LARGE (*)    Protein, ur >300 (*)    All other components within normal limits  CBC - Abnormal; Notable for the following:    RBC 3.42 (*)    Hemoglobin 9.1 (*)    HCT 26.6 (*)    MCV 77.8 (*)    Platelets 131 (*)    All other components within normal limits  DIFFERENTIAL - Abnormal; Notable for the following:    Neutrophils Relative 79 (*)    All other components within normal limits  COMPREHENSIVE METABOLIC PANEL - Abnormal; Notable for the following:    Sodium 134 (*)    Potassium 5.6 (*)    CO2 16 (*)    BUN 102 (*)    Creatinine, Ser 10.03 (*)    Calcium 7.7 (*)    Total Protein 5.6 (*)    Albumin 1.8 (*)    Alkaline Phosphatase 140 (*)    GFR calc non Af Amer 5 (*)    GFR calc Af Amer 5 (*)    All other components within normal limits  LIPASE, BLOOD - Abnormal; Notable for the following:    Lipase 69 (*)    All other components within normal limits  URINE MICROSCOPIC-ADD ON - Abnormal; Notable for the following:    Squamous Epithelial / LPF MANY (*)    Bacteria, UA MANY (*)    Casts GRANULAR CAST (*)    All other components within normal limits  PREGNANCY, URINE   US Abdomen Complete  03/02/2011  *RADIOLOGY REPORT*  Clinical Data: Right upper quadrant pain.  Nausea vomiting.  ABDOMEN ULTRASOUND  Technique:  Complete abdominal ultrasound examination was performed including evaluation of the liver, gallbladder, bile ducts, pancreas, kidneys, spleen, IVC, and abdominal aorta.  Comparison: CT scan from 12/17/2009  Findings:  Gallbladder:  Surgically absent  Common Bile Duct:  Nondilated at 5 mm diameter.  Liver:  Diffuse coarsening of the hepatic echotexture suggest fatty infiltration.  IVC:  Normal.  Pancreas:   Obscured by overlying bowel gas.  Spleen:  Multiple parenchymal calcifications suggest granulomatous disease.  Right kidney:  13.7 cm in long axis.  Cortical echogenicity is increased.  Left kidney:  12.9 cm in long axis.  Poorly visualized  Abdominal Aorta:  No evidence for aneurysm  IMPRESSION: Fatty liver with increased echogenicity of the renal cortex on the right.  Left kidney is not well visualized.  Medical renal disease would be a consideration.  Original Report Authenticated By: ERIC A. MANSELL, M.D.     No diagnosis found.   Date: 03/02/2011  Rate: 83  Rhythm: normal sinus rhythm  QRS Axis: normal  Intervals: normal  ST/T Wave abnormalities: normal  Conduction Disutrbances:none  Narrative Interpretation: no peaked T waves  Old EKG Reviewed: unchanged    MDM  Spanish-speaking female complains of upper abdominal pain with nausea, vomiting diarrhea. Chills and subjective fevers at home and feels like she needs to vomit every time she eats. His right upper quadrant tenderness on exam her abdomen is soft and nontender otherwise.  Elevated BUN and creatinine noted.  Case discussed with on-call nephrologist Dr. Lowanda Foster.  He agrees it is appropriate to keep patient at [facility] for further workup.    I personally performed the services described in this documentation, which was scribed in my presence.  The recorded information has been reviewed and considered.    Maria Essex, MD 03/02/11 [phone]

## 2011-03-02 NOTE — H&P (Addendum)
PCP:   Nephrologist : Dr Lowanda Foster  Chief Complaint:  Nausea, Vomiting  HPI: 35 yr old female who was recently discharged from the hospital after she was diagnosed with nephrotic range proteinuria, patient was seen by Dr Lowanda Foster and was seen in his office as outopatient. 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 Patient was sent on lasix and ace inhibitors. As per Dr Lowanda Foster, patient has no insurance and could not afford cellcept, so she was not started on immunosuppressants. Patient had been having nausea, vomiting for about a month. Initially it was thought to be due to po iron she has been taking. Patient also developed diarrhea for last ten days, she has about three stools per day. Patient also complains of chest pain with nausea/ vomiting. Patient also has been getting worsening swelling of her body including face, legs.  Allergies:  No Known Allergies    Past Medical History  Diagnosis Date  . Gallstone pancreatitis Feb 2011  . Cervix carcinoma in situ Mar 2011  . Proteinuria - cause not known   . Hypertension   . Lupus   . Chronic kidney disease   . Lupus nephritis     Past Surgical History  Procedure Date  . Appendectomy   . Cervical cone biopsy   . Cholecystectomy, laparoscopic   . Cesarean section   .      Prior to Admission medications   Medication Sig Start Date End Date Taking? Authorizing Provider  enalapril (VASOTEC) 5 MG tablet Take 1 tablet (5 mg total) by mouth daily. 01/14/11 01/14/12 Yes Nimish C Gosrani  ferrous sulfate 325 (65 FE) MG tablet Take 1 tablet (325 mg total) by mouth 3 (three) times daily with meals. 01/14/11 01/14/12 Yes Nimish C Gosrani  furosemide (LASIX) 40 MG tablet Take 1 tablet (40 mg total) by mouth 2 (two) times daily. 01/23/11 01/23/12 Yes Charles B. Karle Starch, MD    Social History:  reports that she has never smoked. She does not have any smokeless tobacco history on file. She reports that she does not  drink alcohol or use illicit drugs.  Family History  Problem Relation Age of Onset  . Diabetes Mother   . Diabetes Brother   . Diabetes Brother   . Diabetes Maternal Grandmother     Review of Systems:  Constitutional: Denies fever, chills,and complains of  fatigue.  HEENT: Denies photophobia, eye pain, redness, hearing loss, ear pain, congestion, sore throat, rhinorrhea, sneezing, mouth sores, trouble swallowing. Respiratory:Admits to SOB, No cough.   Cardiovascular: Denies chest pain, palpitations..  Gastrointestinal: as in HPI Genitourinary: Denies dysuria, urgency, frequency, hematuria, flank pain and difficulty urinating.  Musculoskeletal: admits to having myalgias. Skin: Denies pallor, rash and wound.  Neurological: Denies dizziness, seizures, syncope, weakness, light-headedness, numbness and headaches.  Hematological: Denies adenopathy. Easy bruising.    Physical Exam: Blood pressure 139/82, pulse 52, temperature 98 F (36.7 C), temperature source Axillary, resp. rate 22, height 5\' 4"  (1.626 m), weight 91.173 kg (201 lb), last menstrual period 03/02/2011, SpO2 100.00%.  BP 139/82  Pulse 52  Temp(Src) 98 F (36.7 C) (Axillary)  Resp 22  Ht 5\' 4"  (1.626 m)  Wt 91.173 kg (201 lb)  BMI 34.50 kg/m2  SpO2 100%  LMP 03/02/2011  Constitutional: Vital signs reviewed.  Patient is a well-developed and well-nourished  in no acute distress and cooperative with exam. Alert and oriented x3.  Head: Normocephalic and atraumatic Mouth: no erythema or exudates, MMM Eyes: PERRL, EOMI,  conjunctivae normal, No scleral icterus. Positive  periorbital edema. Neck: Supple, Trachea midline normal ROM, No JVD, mass, thyromegaly, or carotid bruit present.  Cardiovascular: RRR, S1 normal, S2 normal, no MRG, pulses symmetric and intact bilaterally Pulmonary/Chest: CTAB, no wheezes, rales, or rhonchi Abdominal: Soft.Tenderness in right upper quadrant, non-distended, bowel sounds are normal, no  masses, organomegaly, or guarding present.  GU: no CVA tenderness Musculoskeletal: No joint deformities, erythema, or stiffness, ROM full and no nontender Hematology: no cervical, inginal, or axillary adenopathy.  Neurological: A&O x3, Strenght is normal and symmetric bilaterally, cranial nerve II-XII are grossly intact, no focal motor deficit, sensory intact to light touch bilaterally.  Skin: Warm, dry and intact. No rash, cyanosis, or clubbing. Extremities: Bilateral 2+ edema in lower extremities.  Psychiatric: Normal mood and affect. speech and behavior is normal. Judgment and thought content normal. Cognition and memory are normal.     Labs on Admission:  Results for orders placed during the hospital encounter of 03/02/11 (from the past 48 hour(s))  URINALYSIS, ROUTINE W REFLEX MICROSCOPIC     Status: Abnormal   Collection Time   03/02/11  9:48 AM      Component Value Range Comment   Color, Urine YELLOW  YELLOW     Appearance CLOUDY (*) CLEAR     Specific Gravity, Urine >1.030 (*) 1.005 - 1.030     pH 6.5  5.0 - 8.0     Glucose, UA NEGATIVE  NEGATIVE (mg/dL)    Hgb urine dipstick LARGE (*) NEGATIVE     Bilirubin Urine NEGATIVE  NEGATIVE     Ketones, ur NEGATIVE  NEGATIVE (mg/dL)    Protein, ur >300 (*) NEGATIVE (mg/dL)    Urobilinogen, UA 0.2  0.0 - 1.0 (mg/dL)    Nitrite NEGATIVE  NEGATIVE     Leukocytes, UA NEGATIVE  NEGATIVE    PREGNANCY, URINE     Status: Normal   Collection Time   03/02/11  9:48 AM      Component Value Range Comment   Preg Test, Ur NEGATIVE     URINE MICROSCOPIC-ADD ON     Status: Abnormal   Collection Time   03/02/11  9:48 AM      Component Value Range Comment   Squamous Epithelial / LPF MANY (*) RARE     WBC, UA 11-20  <3 (WBC/hpf)    RBC / HPF 11-20  <3 (RBC/hpf)    Bacteria, UA MANY (*) RARE     Casts GRANULAR CAST (*) NEGATIVE    CBC     Status: Abnormal   Collection Time   03/02/11  9:49 AM      Component Value Range Comment   WBC 8.0   4.0 - 10.5 (K/uL) WHITE COUNT CONFIRMED ON SMEAR   RBC 3.42 (*) 3.87 - 5.11 (MIL/uL)    Hemoglobin 9.1 (*) 12.0 - 15.0 (g/dL)    HCT 26.6 (*) 36.0 - 46.0 (%)    MCV 77.8 (*) 78.0 - 100.0 (fL)    MCH 26.6  26.0 - 34.0 (pg)    MCHC 34.2  30.0 - 36.0 (g/dL)    RDW 14.3  11.5 - 15.5 (%)    Platelets 131 (*) 150 - 400 (K/uL)   DIFFERENTIAL     Status: Abnormal   Collection Time   03/02/11  9:49 AM      Component Value Range Comment   Neutrophils Relative 79 (*) 43 - 77 (%)    Neutro Abs 6.3  1.7 -  7.7 (K/uL)    Lymphocytes Relative 15  12 - 46 (%)    Lymphs Abs 1.2  0.7 - 4.0 (K/uL)    Monocytes Relative 5  3 - 12 (%)    Monocytes Absolute 0.4  0.1 - 1.0 (K/uL)    Eosinophils Relative 1  0 - 5 (%)    Eosinophils Absolute 0.1  0.0 - 0.7 (K/uL)    Basophils Relative 0  0 - 1 (%)    Basophils Absolute 0.0  0.0 - 0.1 (K/uL)   COMPREHENSIVE METABOLIC PANEL     Status: Abnormal   Collection Time   03/02/11  9:49 AM      Component Value Range Comment   Sodium 134 (*) 135 - 145 (mEq/L)    Potassium 5.6 (*) 3.5 - 5.1 (mEq/L)    Chloride 103  96 - 112 (mEq/L)    CO2 16 (*) 19 - 32 (mEq/L)    Glucose, Bld 96  70 - 99 (mg/dL)    BUN 102 (*) 6 - 23 (mg/dL)    Creatinine, Ser 10.03 (*) 0.50 - 1.10 (mg/dL)    Calcium 7.7 (*) 8.4 - 10.5 (mg/dL)    Total Protein 5.6 (*) 6.0 - 8.3 (g/dL)    Albumin 1.8 (*) 3.5 - 5.2 (g/dL)    AST 11  0 - 37 (U/L)    ALT 11  0 - 35 (U/L)    Alkaline Phosphatase 140 (*) 39 - 117 (U/L)    Total Bilirubin 0.3  0.3 - 1.2 (mg/dL)    GFR calc non Af Amer 5 (*) >90 (mL/min)    GFR calc Af Amer 5 (*) >90 (mL/min)   LIPASE, BLOOD     Status: Abnormal   Collection Time   03/02/11  9:49 AM      Component Value Range Comment   Lipase 69 (*) 11 - 59 (U/L)     Radiological Exams on Admission: Ultrasound: Fatty liver with increased echogenicity of the renal cortex on the  right. Left kidney is not well visualized. Medical renal disease  would be a consideration.     Assessment/Plan    *ARF (acute renal failure): Patient has developed acute renal failure as her creatinine jumped from 2.08 to 10.03, Nephrology consult has been obtained and Dr Lowanda Foster has seen the patient, and recommends starting the patient on IV lasix, cellcept and prednisone. Patient might require hemodialysis. Patient has membranous glomerulonephritis, due to lupus.which is confirmed by biopsy per Dr Lowanda Foster.  Nausea, Vomiting: Secondary to uremia, patient has elevated lipase of 69, but she had cholecystectomy in past. Will give her clear liquid diet. And follow CMP in am.   Bilateral swelling of feet: secondary to nephrotic syndrome   Lupus (systemic lupus erythematosus): Will be started on Cellcept and Prednisone, longterm medications can be problem as she has no insurance, will get social worker to help with medications as out patient.  DVT prophylaxis: heparin.   Time Spent on Admission: 60 minutes  LAMA,GAGAN S 03/02/2011, 12:43 PM

## 2011-03-02 NOTE — Progress Notes (Signed)
Subjective:  Called by nurse to see the patient for chest pain, patient with BP of 188/134, appears in discomfort.  Objective: Vital signs in last 24 hours: Temp:  [98 F (36.7 C)-98.6 F (37 C)] 98.6 F (37 C) (10/14 1600) Pulse Rate:  [52-88] 76  (10/14 1600) Resp:  [16-22] 20  (10/14 1600) BP: (139-172)/(82-117) 172/90 mmHg (10/14 1600) SpO2:  [96 %-100 %] 96 % (10/14 1315) Weight:  [91.173 kg (201 lb)] 201 lb (91.173 kg) (10/14 0922) Weight change:  Last BM Date: 03/01/11  Intake/Output from previous day:       Physical Exam: General: Alert, awake, in acute distress. Heart: Regular rate and rhythm, without murmurs, rubs, gallops. Lungs: Clear to auscultation bilaterally. Abdomen: Soft, nontender, nondistended, positive bowel sounds. Extremities: Positive edema bilaterally. Neuro: Grossly intact, nonfocal.    Lab Results: Basic Metabolic Panel:  Basename 03/02/11 0949  NA 134*  K 5.6*  CL 103  CO2 16*  GLUCOSE 96  BUN 102*  CREATININE 10.03*  CALCIUM 7.7*  MG --  PHOS --   Liver Function Tests:  Basename 03/02/11 0949  AST 11  ALT 11  ALKPHOS 140*  BILITOT 0.3  PROT 5.6*  ALBUMIN 1.8*    Basename 03/02/11 0949  LIPASE 69*  AMYLASE --   No results found for this basename: AMMONIA:2 in the last 72 hours CBC:  Basename 03/02/11 0949  WBC 8.0  NEUTROABS 6.3  HGB 9.1*  HCT 26.6*  MCV 77.8*  PLT 131*   Cardiac Enzymes: No results found for this basename: CKTOTAL:3,CKMB:3,CKMBINDEX:3,TROPONINI:3 in the last 72 hours BNP: No results found for this basename: POCBNP:3 in the last 72 hours D-Dimer: No results found for this basename: DDIMER:2 in the last 72 hours CBG: No results found for this basename: GLUCAP:6 in the last 72 hours Hemoglobin A1C: No results found for this basename: HGBA1C in the last 72 hours Fasting Lipid Panel: No results found for this basename: CHOL,HDL,LDLCALC,TRIG,CHOLHDL,LDLDIRECT in the last 72 hours Thyroid  Function Tests: No results found for this basename: TSH,T4TOTAL,FREET4,T3FREE,THYROIDAB in the last 72 hours Anemia Panel: No results found for this basename: VITAMINB12,FOLATE,FERRITIN,TIBC,IRON,RETICCTPCT in the last 72 hours  Alcohol Level: No results found for this basename: ETH:2 in the last 72 hours   No results found for this or any previous visit (from the past 240 hour(s)).  Studies/Results: Dg Chest 2 View  03/02/2011  *RADIOLOGY REPORT*  Clinical Data: Shortness of breath.  CHEST - 2 VIEW  Comparison: 02/11/2011  Findings: Two-view exam shows patchy airspace disease in the right base consistent with pneumonia. Cardiopericardial silhouette is at upper limits of normal for size. Interstitial markings are diffusely coarsened with chronic features. Imaged bony structures of the thorax are intact.  IMPRESSION: Patchy airspace disease at the right base consistent with pneumonia.  Original Report Authenticated By: ERIC A. MANSELL, M.D.   US Abdomen Complete  03/02/2011  *RADIOLOGY REPORT*  Clinical Data: Right upper quadrant pain.  Nausea vomiting.  ABDOMEN ULTRASOUND  Technique:  Complete abdominal ultrasound examination was performed including evaluation of the liver, gallbladder, bile ducts, pancreas, kidneys, spleen, IVC, and abdominal aorta.  Comparison: CT scan from 12/17/2009  Findings:  Gallbladder:  Surgically absent  Common Bile Duct:  Nondilated at 5 mm diameter.  Liver:  Diffuse coarsening of the hepatic echotexture suggest fatty infiltration.  IVC:  Normal.  Pancreas:  Obscured by overlying bowel gas.  Spleen:  Multiple parenchymal calcifications suggest granulomatous disease.  Right kidney:  13.7 cm in  long axis.  Cortical echogenicity is increased.  Left kidney:  12.9 cm in long axis.  Poorly visualized  Abdominal Aorta:  No evidence for aneurysm  IMPRESSION: Fatty liver with increased echogenicity of the renal cortex on the right.  Left kidney is not well visualized.  Medical  renal disease would be a consideration.  Original Report Authenticated By: ERIC A. MANSELL, M.D.    Medications: Scheduled Meds:   . albumin human  50 g Intravenous BID  . furosemide  200 mg Intravenous BID  . heparin  5,000 Units Subcutaneous Q8H  . hydrALAZINE      . HYDROmorphone      . influenza  inactive virus vaccine  0.5 mL Intramuscular Once  .  morphine injection  4 mg Intravenous Once  . nitroGLYCERIN      . ondansetron (ZOFRAN) IV  4 mg Intravenous Once  . pneumococcal 23 valent vaccine  0.5 mL Intramuscular Tomorrow-1000  . predniSONE  60 mg Oral Daily  . sodium chloride  1,000 mL Intravenous Once  . sodium polystyrene  45 g Oral Once  . DISCONTD: predniSONE  10 mg Oral Daily  . DISCONTD: predniSONE  60 mg Oral Daily   Continuous Infusions:   . sodium chloride 75 mL/hr at 03/02/11 1355   PRN Meds:.ondansetron (ZOFRAN) IV, ondansetron, oxyCODONE  Assessment/Plan:  Chest Pain: Sec to hypertensive urgency, will start IV hydralazine prn, check cardiac enzymes, IV dilaudid for pain. Also start Metoprolol 25 mg po BID, Will transfer to ICU. Also will start prn labetolol , if no response with hydralazine. Pneumonia: Will start IV Avelox 400 mg daily. Sinus tachycardia: Will check v/q scan to r/o PE.    LOS: 0 days   LAMA,GAGAN S 03/02/2011, 5:28 PM

## 2011-03-02 NOTE — Progress Notes (Signed)
Called to the patients room due to pt c/o of pain.  Maria Pilgrim, RN was also notified to come to the patient room. Went in the room and the pt c/o chest pain.  She was sweating, BP was 186/130, she unable to be still, and c/o being hot.  CP protocol initiated, MD notified of what interventions that had been done, vital signs, and given Chest xray and EKG results.  New orders given and followed.

## 2011-03-02 NOTE — Consult Note (Signed)
Reason for Consult: Worsening of renal failure and anasarca Referring Physician: [facility]  Maria Vazquez is an 35 y.o. female.  HPI: She is a patient with well-known to me admitted last time here because of her anasarca some abdominal pain nausea. Her exact time patient was found to have nephrotic syndrome with low complement ANA was positive and anti-DNA was also positive . Patient had kidney biopsy and discharged home. She was brought back from home and the discuss with her about the biopsy which showed membranous lupus nephritis. Since patient couldn't afford to buy medications the only thing she was put also on ACE inhibitor and diuretics. Presently patient came with history of nausea vomiting and diarrhea for the last months and presently her pending creatinine was found to be significantly high.  Past Medical History  Diagnosis Date  . Gallstone pancreatitis Feb 2011  . Cervix carcinoma in situ Mar 2011  . Proteinuria - cause not known   . Hypertension   . Lupus   . Chronic kidney disease   . Lupus nephritis     Past Surgical History  Procedure Date  . Appendectomy   . Cervical cone biopsy   . Cholecystectomy, laparoscopic   . Cesarean section   . Cholecystectomy     Family History  Problem Relation Age of Onset  . Diabetes Mother   . Diabetes Brother   . Diabetes Brother   . Diabetes Maternal Grandmother     Social History:  reports that she has never smoked. She does not have any smokeless tobacco history on file. She reports that she does not drink alcohol or use illicit drugs.  Allergies: No Known Allergies  Medications: I have reviewed the patient's current medications.  Results for orders placed during the hospital encounter of 03/02/11 (from the past 48 hour(s))  URINALYSIS, ROUTINE W REFLEX MICROSCOPIC     Status: Abnormal   Collection Time   03/02/11  9:48 AM      Component Value Range Comment   Color, Urine YELLOW  YELLOW     Appearance CLOUDY (*) CLEAR     Specific Gravity, Urine >1.030 (*) 1.005 - 1.030     pH 6.5  5.0 - 8.0     Glucose, UA NEGATIVE  NEGATIVE (mg/dL)    Hgb urine dipstick LARGE (*) NEGATIVE     Bilirubin Urine NEGATIVE  NEGATIVE     Ketones, ur NEGATIVE  NEGATIVE (mg/dL)    Protein, ur >300 (*) NEGATIVE (mg/dL)    Urobilinogen, UA 0.2  0.0 - 1.0 (mg/dL)    Nitrite NEGATIVE  NEGATIVE     Leukocytes, UA NEGATIVE  NEGATIVE    PREGNANCY, URINE     Status: Normal   Collection Time   03/02/11  9:48 AM      Component Value Range Comment   Preg Test, Ur NEGATIVE     URINE MICROSCOPIC-ADD ON     Status: Abnormal   Collection Time   03/02/11  9:48 AM      Component Value Range Comment   Squamous Epithelial / LPF MANY (*) RARE     WBC, UA 11-20  <3 (WBC/hpf)    RBC / HPF 11-20  <3 (RBC/hpf)    Bacteria, UA MANY (*) RARE     Casts GRANULAR CAST (*) NEGATIVE    CBC     Status: Abnormal   Collection Time   03/02/11  9:49 AM      Component Value Range Comment  WBC 8.0  4.0 - 10.5 (K/uL) WHITE COUNT CONFIRMED ON SMEAR   RBC 3.42 (*) 3.87 - 5.11 (MIL/uL)    Hemoglobin 9.1 (*) 12.0 - 15.0 (g/dL)    HCT 26.6 (*) 36.0 - 46.0 (%)    MCV 77.8 (*) 78.0 - 100.0 (fL)    MCH 26.6  26.0 - 34.0 (pg)    MCHC 34.2  30.0 - 36.0 (g/dL)    RDW 14.3  11.5 - 15.5 (%)    Platelets 131 (*) 150 - 400 (K/uL)   DIFFERENTIAL     Status: Abnormal   Collection Time   03/02/11  9:49 AM      Component Value Range Comment   Neutrophils Relative 79 (*) 43 - 77 (%)    Neutro Abs 6.3  1.7 - 7.7 (K/uL)    Lymphocytes Relative 15  12 - 46 (%)    Lymphs Abs 1.2  0.7 - 4.0 (K/uL)    Monocytes Relative 5  3 - 12 (%)    Monocytes Absolute 0.4  0.1 - 1.0 (K/uL)    Eosinophils Relative 1  0 - 5 (%)    Eosinophils Absolute 0.1  0.0 - 0.7 (K/uL)    Basophils Relative 0  0 - 1 (%)    Basophils Absolute 0.0  0.0 - 0.1 (K/uL)   COMPREHENSIVE METABOLIC PANEL     Status: Abnormal   Collection Time   03/02/11  9:49 AM       Component Value Range Comment   Sodium 134 (*) 135 - 145 (mEq/L)    Potassium 5.6 (*) 3.5 - 5.1 (mEq/L)    Chloride 103  96 - 112 (mEq/L)    CO2 16 (*) 19 - 32 (mEq/L)    Glucose, Bld 96  70 - 99 (mg/dL)    BUN 102 (*) 6 - 23 (mg/dL)    Creatinine, Ser 10.03 (*) 0.50 - 1.10 (mg/dL)    Calcium 7.7 (*) 8.4 - 10.5 (mg/dL)    Total Protein 5.6 (*) 6.0 - 8.3 (g/dL)    Albumin 1.8 (*) 3.5 - 5.2 (g/dL)    AST 11  0 - 37 (U/L)    ALT 11  0 - 35 (U/L)    Alkaline Phosphatase 140 (*) 39 - 117 (U/L)    Total Bilirubin 0.3  0.3 - 1.2 (mg/dL)    GFR calc non Af Amer 5 (*) >90 (mL/min)    GFR calc Af Amer 5 (*) >90 (mL/min)   LIPASE, BLOOD     Status: Abnormal   Collection Time   03/02/11  9:49 AM      Component Value Range Comment   Lipase 69 (*) 11 - 59 (U/L)     US Abdomen Complete  03/02/2011  *RADIOLOGY REPORT*  Clinical Data: Right upper quadrant pain.  Nausea vomiting.  ABDOMEN ULTRASOUND  Technique:  Complete abdominal ultrasound examination was performed including evaluation of the liver, gallbladder, bile ducts, pancreas, kidneys, spleen, IVC, and abdominal aorta.  Comparison: CT scan from 12/17/2009  Findings:  Gallbladder:  Surgically absent  Common Bile Duct:  Nondilated at 5 mm diameter.  Liver:  Diffuse coarsening of the hepatic echotexture suggest fatty infiltration.  IVC:  Normal.  Pancreas:  Obscured by overlying bowel gas.  Spleen:  Multiple parenchymal calcifications suggest granulomatous disease.  Right kidney:  13.7 cm in long axis.  Cortical echogenicity is increased.  Left kidney:  12.9 cm in long axis.  Poorly visualized  Abdominal Aorta:  No  evidence for aneurysm  IMPRESSION: Fatty liver with increased echogenicity of the renal cortex on the right.  Left kidney is not well visualized.  Medical renal disease would be a consideration.  Original Report Authenticated By: ERIC A. MANSELL, M.D.    Review of Systems  Constitutional: Negative for chills.       Patient with facial  puffiness and swelling of her eyelids. Patient doesn't have any conjunctival pallor. She is none icterus. Her oral mucosa seems to be dry.  Cardiovascular: Positive for leg swelling and PND.  Gastrointestinal: Positive for nausea, vomiting, abdominal pain and diarrhea.  Neurological: Positive for weakness. Negative for headaches.   Blood pressure 139/82, pulse 52, temperature 98 F (36.7 C), temperature source Axillary, resp. rate 22, height 5\' 4"  (1.626 m), weight 91.173 kg (201 lb), last menstrual period 03/02/2011, SpO2 100.00%. Physical Exam  HENT:       Patient with partial puffiness and also swelling of her eyelids. She has her dry mouth. She doesn't have any conjunctival pallor or icterus.  Eyes: No scleral icterus.  Cardiovascular: Normal rate, regular rhythm and normal heart sounds.   No murmur heard. Respiratory: She has no wheezes. She has no rales.  Musculoskeletal: She exhibits edema.    Assessment/Plan: Problem #1 acute kidney injury superimposed on chronic her BUN and creatinine has increased significantly. The etiology could be a combination of  prerenal syndrome from nausea vomiting diarrhea accompanied with low albumin and also ACE inhibitor. Presently patient has nausea and vomiting.   the last time when she was here even though her creatinine was okay patient was also complaining about nausea , abdominal pain and vomiting. 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 Patient is only on ACE inhibitor because of difficulty in getting medication. During our office visit I discussed with her benefits of putting her  on CellCept versus Cytoxan.at that  time was planned to put on CellCept and steroid however since patient doesn't have money to buy any of her medications we decided to try her with ACE inhibitor until she comes to the office. Presently she has similar anasarca with low albumin. Problem #3 history of anemia thought to be secondary to iron  deficiency and she was put on iron supplement. Problem #4 history of nausea vomiting diarrhea etiology as this moment is not clear but she has been complaining about nausea and vomiting for some time thought to be secondary to oral iron. Recommendation we'll start her on albumin 50 g IV twice a day for 2 days We'll give her Lasix 200 mg IV twice a day We'll add typical is on 2.5 mg by mouth twice a day We'll start her 1 prednisone 60 mg by mouth once a day Probably would consider using also CellCept. If her renal function doesn't improve patient may require dialysis. And I have discussed with her through an interpreter. We'll check her basic metabolic panel and CBC in the morning. We'll check also her phosphorus.  Maria Vazquez S 03/02/2011, 12:46 PM

## 2011-03-02 NOTE — Progress Notes (Signed)
Chest pain was not relieved so another Nitro was given.  No changes from the previous condition.  BP 188/137 HR 125 MD at beside. Will continue to monitor and follow orders.

## 2011-03-02 NOTE — Progress Notes (Signed)
Pt stated chest pain was relieved, but still had some discomfort.  Dr. Darrick Meigs present.  BP went down to 163/111 and HR 110.  Writer made family present aware that pt was tranferring to ICU.  Pt alert and falling to sleep at this time.

## 2011-03-02 NOTE — ED Notes (Signed)
Pt c/o nausea and vomiting. Pt states she has not vomited today but has been heaving. Pt states she has not eaten since last pm but has been vomiting for 5 days.

## 2011-03-02 NOTE — Progress Notes (Signed)
Writer called report to Lorenzo, RN in ICU.  Pt transferred by writer to ICU room 10.  Family took all belongings to her room.

## 2011-03-03 ENCOUNTER — Inpatient Hospital Stay (HOSPITAL_COMMUNITY): Payer: Medicaid Other

## 2011-03-03 DIAGNOSIS — R072 Precordial pain: Secondary | ICD-10-CM

## 2011-03-03 LAB — BASIC METABOLIC PANEL
BUN: 108 mg/dL — ABNORMAL HIGH (ref 6–23)
Calcium: 7.8 mg/dL — ABNORMAL LOW (ref 8.4–10.5)
Creatinine, Ser: 10.74 mg/dL — ABNORMAL HIGH (ref 0.50–1.10)
GFR calc Af Amer: 5 mL/min — ABNORMAL LOW (ref >90)
GFR calc non Af Amer: 4 mL/min — ABNORMAL LOW (ref >90)
Glucose, Bld: 117 mg/dL — ABNORMAL HIGH (ref 70–99)

## 2011-03-03 LAB — CBC
Hemoglobin: 7.5 g/dL — ABNORMAL LOW (ref 12.0–15.0)
MCHC: 33.5 g/dL (ref 30.0–36.0)
Platelets: 114 10*3/uL — ABNORMAL LOW (ref 150–400)
RBC: 2.88 MIL/uL — ABNORMAL LOW (ref 3.87–5.11)

## 2011-03-03 LAB — CARDIAC PANEL(CRET KIN+CKTOT+MB+TROPI)
CK, MB: 4 ng/mL (ref 0.3–4.0)
Troponin I: 0.78 ng/mL (ref <0.30)

## 2011-03-03 LAB — TSH: TSH: 1.984 u[IU]/mL (ref 0.350–4.500)

## 2011-03-03 MED ORDER — INFLUENZA VIRUS VACC SPLIT PF IM SUSP
0.5000 mL | Freq: Once | INTRAMUSCULAR | Status: AC
  Administered 2011-03-04: 0.5 mL via INTRAMUSCULAR
  Filled 2011-03-03: qty 0.5

## 2011-03-03 MED ORDER — SODIUM POLYSTYRENE SULFONATE 15 GM/60ML PO SUSP
30.0000 g | Freq: Once | ORAL | Status: AC
  Administered 2011-03-03: 30 g via ORAL
  Filled 2011-03-03: qty 120

## 2011-03-03 MED ORDER — MORPHINE SULFATE 2 MG/ML IJ SOLN
2.0000 mg | Freq: Once | INTRAMUSCULAR | Status: AC
  Administered 2011-03-03: 2 mg via INTRAVENOUS
  Filled 2011-03-03: qty 1

## 2011-03-03 MED ORDER — HYDROMORPHONE HCL 2 MG/ML IJ SOLN
2.0000 mg | INTRAMUSCULAR | Status: DC | PRN
  Administered 2011-03-03 – 2011-03-05 (×3): 2 mg via INTRAVENOUS
  Administered 2011-03-05: 1 mg via INTRAVENOUS
  Administered 2011-03-05: 2 mg via INTRAVENOUS
  Filled 2011-03-03 (×3): qty 2
  Filled 2011-03-03: qty 1
  Filled 2011-03-03 (×3): qty 2

## 2011-03-03 MED ORDER — PNEUMOCOCCAL VAC POLYVALENT 25 MCG/0.5ML IJ INJ
0.5000 mL | INJECTION | INTRAMUSCULAR | Status: AC
  Filled 2011-03-03: qty 0.5

## 2011-03-03 MED ORDER — SODIUM CHLORIDE 0.9 % IV SOLN: 100.0000 mL | INTRAVENOUS | Status: DC | PRN

## 2011-03-03 MED ORDER — NITROGLYCERIN 2 % TD OINT
1.0000 [in_us] | TOPICAL_OINTMENT | Freq: Three times a day (TID) | TRANSDERMAL | Status: DC
  Administered 2011-03-03 – 2011-03-05 (×6): 1 [in_us] via TOPICAL
  Filled 2011-03-03 (×6): qty 1

## 2011-03-03 MED ORDER — ASPIRIN 325 MG PO TABS
325.0000 mg | ORAL_TABLET | Freq: Every day | ORAL | Status: DC
  Administered 2011-03-03 – 2011-03-05 (×4): 325 mg via ORAL
  Filled 2011-03-03 (×3): qty 1

## 2011-03-03 NOTE — Progress Notes (Signed)
Patient complains of chest pain administering SL nitro at this time. Also put back on O2 via [place] @ 2lpm

## 2011-03-03 NOTE — Progress Notes (Signed)
Pt has gone down for mri

## 2011-03-03 NOTE — Progress Notes (Signed)
Titrating nitro drip down at this time. Will closely monitor VS and presence of chest pain. Pt has no c/o CP at this time.

## 2011-03-03 NOTE — Progress Notes (Signed)
Subjective: Interval History: has complaints right-sided chest pain aggravated by movement and deep breathing really patient still her she is feeling better appetite is still very poor she doesn't have any nausea vomiting however. The diarrhea also has stopped..  Objective: Vital signs in last 24 hours: Temp:  [97.7 F (36.5 C)-98.7 F (37.1 C)] 97.7 F (36.5 C) (10/15 0400) Pulse Rate:  [52-123] 73  (10/15 0500) Resp:  [13-22] 15  (10/15 0500) BP: (130-172)/(80-117) 133/87 mmHg (10/15 0500) SpO2:  [88 %-100 %] 98 % (10/15 0500) Weight:  [91.173 kg (201 lb)-94.5 kg (208 lb 5.4 oz)] 208 lb 5.4 oz (94.5 kg) (10/15 0500) Weight change:   Intake/Output from previous day: 10/14 0701 - 10/15 0700 In: 900 [I.V.:900] Out: -  Intake/Output this shift:    General appearance: alert, cooperative and mildly obese Resp: diminished breath sounds bibasilar and bilaterally Cardio: regular rate and rhythm, S1, S2 normal, no murmur, click, rub or gallop GI: soft, non-tender; bowel sounds normal; no masses,  no organomegaly Extremities: edema Possibly 1-2+ edema.  Lab Results:  [facility] 03/03/11 0338 03/02/11 0949  WBC 6.8 8.0  HGB 7.5* 9.1*  HCT 22.4* 26.6*  PLT 114* 131*   BMET:  Basename 03/02/11 2252 03/02/11 0949  NA 132* 134*  K 5.9* 5.6*  CL 102 103  CO2 15* 16*  GLUCOSE 126* 96  BUN 99* 102*  CREATININE 8.80* 10.03*  CALCIUM 7.5* 7.7*   No results found for this basename: PTH:2 in the last 72 hours Iron Studies: No results found for this basename: IRON,TIBC,TRANSFERRIN,FERRITIN in the last 72 hours  Studies/Results: Dg Chest 2 View  03/02/2011  *RADIOLOGY REPORT*  Clinical Data: Shortness of breath.  CHEST - 2 VIEW  Comparison: 02/11/2011  Findings: Two-view exam shows patchy airspace disease in the right base consistent with pneumonia. Cardiopericardial silhouette is at upper limits of normal for size. Interstitial markings are diffusely coarsened with chronic features.  Imaged bony structures of the thorax are intact.  IMPRESSION: Patchy airspace disease at the right base consistent with pneumonia.  Original Report Authenticated By: ERIC A. MANSELL, M.D.   US Abdomen Complete  03/02/2011  *RADIOLOGY REPORT*  Clinical Data: Right upper quadrant pain.  Nausea vomiting.  ABDOMEN ULTRASOUND  Technique:  Complete abdominal ultrasound examination was performed including evaluation of the liver, gallbladder, bile ducts, pancreas, kidneys, spleen, IVC, and abdominal aorta.  Comparison: CT scan from 12/17/2009  Findings:  Gallbladder:  Surgically absent  Common Bile Duct:  Nondilated at 5 mm diameter.  Liver:  Diffuse coarsening of the hepatic echotexture suggest fatty infiltration.  IVC:  Normal.  Pancreas:  Obscured by overlying bowel gas.  Spleen:  Multiple parenchymal calcifications suggest granulomatous disease.  Right kidney:  13.7 cm in long axis.  Cortical echogenicity is increased.  Left kidney:  12.9 cm in long axis.  Poorly visualized  Abdominal Aorta:  No evidence for aneurysm  IMPRESSION: Fatty liver with increased echogenicity of the renal cortex on the right.  Left kidney is not well visualized.  Medical renal disease would be a consideration.  Original Report Authenticated By: ERIC A. MANSELL, M.D.   Nm Pulmonary Per & Vent  03/02/2011  *RADIOLOGY REPORT*  Clinical Data:  Chest pain with shortness of breath and bilateral foot swelling.  History of renal failure and systemic lupus erythematosus.  Question pulmonary embolism.  NUCLEAR MEDICINE VENTILATION - PERFUSION LUNG SCAN  Technique:  Wash-in, equilibrium, and wash-out phase ventilation images were obtained using Xe-133 gas.  Perfusion images were obtained in multiple projections after intravenous injection of Tc- 53m MAA.  Radiopharmaceuticals:  11.2 mCi Xe-133 gas and 5.0 mCi Tc-54m MAA.  Comparison:  Chest radiographs same date.  Findings: Ventilation images are within normal limits.  The perfusion study is  within normal limits.  There are no segmental mismatches.  IMPRESSION: Very low probability for acute pulmonary embolism.  Original Report Authenticated By: Vivia Ewing, M.D.    I have reviewed the patient's current medications.  Assessment/Plan: Problem #1 acute kidney injury superimposed on chronic. Etiology for her acute renal failure could be secondary to prerenal syndrome as patient has long-standing gastrointestinal problems accompanied with nausea vomiting diarrhea and her urine specific gravity. Since the patient was also on ACE inhibitor and diuretics probably that might have contributed to her etiology. Lastly since patient has nephrotic syndrome some red cells in the urine renal vein thrombosis also needs to be entertained as  possibility. Presently patient is oliguric. Problem #2 hyperkalemimia possibly related to her renal failure. Problem #3 history of chest pain chest x-ray showed she has possibly infiltrate VQ scan showed very little probability for PE. Patient is on antibiotics. Problem #4 history of membranous nephropathy. Problem #5 history of hypertension her blood pressure seems to be controlled reasonably good Problem #6 history of her anasarca secondary to nephrotic syndrome she is on the maximum dose of Lasix because of her urine output still very poor. Problem #7 history of nausea or vomiting and abdominal pain long-standing even before her evening creatinine has increased. Etiology is not clear presently she is feeling better and she doesn't have any diarrhea. Recommendation because of her hyperkalemia and poor urine output and very high pending creatinine we'll consider dialyzing her. We'll discuss with the radiology lotion we have to check for renal vein thrombosis.   LOS: 1 day   Nimah Uphoff S 03/03/2011,7:32 AM

## 2011-03-03 NOTE — Progress Notes (Signed)
Subjective:  Patient seen and examined, chest pain resolved, BP is now stable. Dr Lowanda Foster is planning for hemodialysis. Patient is currently on IV Nitroglycerin drip at 63mcg/min. Also has mild elevation of troponin.  Objective: Vital signs in last 24 hours: Temp:  [97.5 F (36.4 C)-98.7 F (37.1 C)] 97.5 F (36.4 C) (10/15 0800) Pulse Rate:  [73-123] 77  (10/15 0900) Resp:  [12-21] 19  (10/15 0900) BP: (121-172)/(80-108) 147/89 mmHg (10/15 0900) SpO2:  [88 %-100 %] 99 % (10/15 0900) Weight:  [92.9 kg (204 lb 12.9 oz)-94.5 kg (208 lb 5.4 oz)] 208 lb 5.4 oz (94.5 kg) (10/15 0500) Weight change:  Last BM Date: 03/02/11  Intake/Output from previous day: 10/14 0701 - 10/15 0700 In: 975 [I.V.:975] Out: -  Total I/O In: 75 [I.V.:75] Out: -    Physical Exam: General: Alert, awake, oriented x3, in no acute distress. HEENT: No bruits, no goiter. Heart: Regular rate and rhythm, without murmurs, rubs, gallops. Lungs: Clear to auscultation bilaterally. Abdomen: Soft, nontender, nondistended, positive bowel sounds. Extremities: Bilateral 1+ edema of lower extremities. Neuro: Grossly intact, nonfocal.    Lab Results: Basic Metabolic Panel:  Basename 03/03/11 0755 03/02/11 2252  NA 133* 132*  K 5.7* 5.9*  CL 101 102  CO2 15* 15*  GLUCOSE 117* 126*  BUN 108* 99*  CREATININE 10.74* 8.80*  CALCIUM 7.8* 7.5*  MG -- --  PHOS -- 11.7*   Liver Function Tests:  [facility] 03/02/11 2252 03/02/11 0949  AST 26 11  ALT 19 11  ALKPHOS 208* 140*  BILITOT 0.3 0.3  PROT 5.5* 5.6*  ALBUMIN 2.5* 1.8*    Basename 03/02/11 0949  LIPASE 69*  AMYLASE --   No results found for this basename: AMMONIA:2 in the last 72 hours CBC:  Basename 03/03/11 0338 03/02/11 0949  WBC 6.8 8.0  NEUTROABS -- 6.3  HGB 7.5* 9.1*  HCT 22.4* 26.6*  MCV 77.8* 77.8*  PLT 114* 131*   Cardiac Enzymes:  Basename 03/03/11 0732 03/02/11 2253 03/02/11 1745  CKTOTAL 50 50 45  CKMB 5.4* 3.7 2.2    CKMBINDEX -- -- --  TROPONINI 0.61* <0.30 <0.30   BNP: No results found for this basename: POCBNP:3 in the last 72 hours D-Dimer: No results found for this basename: DDIMER:2 in the last 72 hours CBG: No results found for this basename: GLUCAP:6 in the last 72 hours Hemoglobin A1C: No results found for this basename: HGBA1C in the last 72 hours Fasting Lipid Panel: No results found for this basename: CHOL,HDL,LDLCALC,TRIG,CHOLHDL,LDLDIRECT in the last 72 hours Thyroid Function Tests:  Basename 03/02/11 0949  TSH 1.984  T4TOTAL --  FREET4 --  T3FREE --  THYROIDAB --      Recent Results (from the past 240 hour(s))  MRSA PCR SCREENING     Status: Normal   Collection Time   03/02/11  7:19 PM      Component Value Range Status Comment   MRSA by PCR NEGATIVE  NEGATIVE  Final     Studies/Results: Dg Chest 2 View  03/02/2011  *RADIOLOGY REPORT*  Clinical Data: Shortness of breath.  CHEST - 2 VIEW  Comparison: 02/11/2011  Findings: Two-view exam shows patchy airspace disease in the right base consistent with pneumonia. Cardiopericardial silhouette is at upper limits of normal for size. Interstitial markings are diffusely coarsened with chronic features. Imaged bony structures of the thorax are intact.  IMPRESSION: Patchy airspace disease at the right base consistent with pneumonia.  Original Report Authenticated By: ERIC  A. MANSELL, M.D.   US Abdomen Complete  03/02/2011  *RADIOLOGY REPORT*  Clinical Data: Right upper quadrant pain.  Nausea vomiting.  ABDOMEN ULTRASOUND  Technique:  Complete abdominal ultrasound examination was performed including evaluation of the liver, gallbladder, bile ducts, pancreas, kidneys, spleen, IVC, and abdominal aorta.  Comparison: CT scan from 12/17/2009  Findings:  Gallbladder:  Surgically absent  Common Bile Duct:  Nondilated at 5 mm diameter.  Liver:  Diffuse coarsening of the hepatic echotexture suggest fatty infiltration.  IVC:  Normal.  Pancreas:   Obscured by overlying bowel gas.  Spleen:  Multiple parenchymal calcifications suggest granulomatous disease.  Right kidney:  13.7 cm in long axis.  Cortical echogenicity is increased.  Left kidney:  12.9 cm in long axis.  Poorly visualized  Abdominal Aorta:  No evidence for aneurysm  IMPRESSION: Fatty liver with increased echogenicity of the renal cortex on the right.  Left kidney is not well visualized.  Medical renal disease would be a consideration.  Original Report Authenticated By: ERIC A. MANSELL, M.D.   Nm Pulmonary Per & Vent  03/02/2011  *RADIOLOGY REPORT*  Clinical Data:  Chest pain with shortness of breath and bilateral foot swelling.  History of renal failure and systemic lupus erythematosus.  Question pulmonary embolism.  NUCLEAR MEDICINE VENTILATION - PERFUSION LUNG SCAN  Technique:  Wash-in, equilibrium, and wash-out phase ventilation images were obtained using Xe-133 gas.  Perfusion images were obtained in multiple projections after intravenous injection of Tc- 69m MAA.  Radiopharmaceuticals:  11.2 mCi Xe-133 gas and 5.0 mCi Tc-56m MAA.  Comparison:  Chest radiographs same date.  Findings: Ventilation images are within normal limits.  The perfusion study is within normal limits.  There are no segmental mismatches.  IMPRESSION: Very low probability for acute pulmonary embolism.  Original Report Authenticated By: Vivia Ewing, M.D.    Medications: Scheduled Meds:   . albumin human  50 g Intravenous BID  . cloNIDine  0.1 mg Oral BID  . furosemide  200 mg Intravenous BID  . heparin  5,000 Units Subcutaneous Q8H  . hydrALAZINE      . HYDROmorphone      . influenza  inactive virus vaccine  0.5 mL Intramuscular Once  . metoprolol tartrate  25 mg Oral BID  . moxifloxacin  400 mg Intravenous Q24H  . nitroGLYCERIN      . nitroGLYCERIN      . pneumococcal 23 valent vaccine  0.5 mL Intramuscular Tomorrow-1000  . sodium chloride  1,000 mL Intravenous Once  . sodium chloride      .  sodium chloride      . sodium polystyrene  45 g Oral Once  . DISCONTD: predniSONE  10 mg Oral Daily  . DISCONTD: predniSONE  60 mg Oral Daily  . DISCONTD: predniSONE  60 mg Oral Daily   Continuous Infusions:   . sodium chloride 75 mL/hr at 03/03/11 0500   PRN Meds:.sodium chloride, hydrALAZINE, HYDROmorphone (DILAUDID) injection, labetalol, nitroGLYCERIN, ondansetron (ZOFRAN) IV, ondansetron, oxyCODONE, technetium albumin aggregated, xenon xe 133, DISCONTD:  HYDROmorphone (DILAUDID) injection  Assessment/Plan:  Principal Problem:  *ARF (acute renal failure) Patient would require hemodialysis due to persistent hyperkalemia and low urine output. Continue with lasix drip. Active Problems:  SOB (shortness of breath): Resolved, secondary to hypertensive urgency.  Bilateral swelling of feet/ Anasarca: Patient has nephrotic syndrome,  Due to lupus nephropathy, on Prednisone has been discontinued by nephrology.  Lupus (systemic lupus erythematosus): Prednisone has been discontinued. Will need a rheumatology consult  as outpatient.   Chest pain at rest: Resolved, will start tapering off nitroglycerin drip.  Hypertensive urgency: Resolved, BP is now stable. Continue Clonidine and Metoprolol,  Also continue hydralazine prn.  Pneumonia: Continue with Avelox. Elevated Troponin: Discussed with [facility] cardiology Dr Domenic Polite, troponin is elevated due to strain pattern, secondary to hypertensive urgency.Will continue to cycle cardiac enzymes, if troponin continue to rise will call cardiology for further recommendations. 2 D Echo is pending.    LOS: 1 day   Blayde Bacigalupi S 03/03/2011, 10:18 AM

## 2011-03-03 NOTE — Progress Notes (Signed)
*  PRELIMINARY RESULTS* Echocardiogram 2D Echocardiogram has been performed.  Tera Partridge 03/03/2011, 4:20 PM

## 2011-03-04 ENCOUNTER — Inpatient Hospital Stay (HOSPITAL_COMMUNITY): Payer: Medicaid Other

## 2011-03-04 LAB — CARDIAC PANEL(CRET KIN+CKTOT+MB+TROPI)
CK, MB: 3.2 ng/mL (ref 0.3–4.0)
CK, MB: 2.9 ng/mL (ref 0.3–4.0)
Relative Index: INVALID (ref 0.0–2.5)
Relative Index: INVALID (ref 0.0–2.5)
Total CK: 35 U/L (ref 7–177)
Total CK: 31 U/L (ref 7–177)
Troponin I: 0.45 ng/mL (ref <0.30)

## 2011-03-04 LAB — BASIC METABOLIC PANEL
BUN: 117 mg/dL — ABNORMAL HIGH (ref 6–23)
CO2: 14 mEq/L — ABNORMAL LOW (ref 19–32)
Calcium: 7.6 mg/dL — ABNORMAL LOW (ref 8.4–10.5)
Glucose, Bld: 91 mg/dL (ref 70–99)
Sodium: 132 mEq/L — ABNORMAL LOW (ref 135–145)

## 2011-03-04 LAB — CBC
HCT: 19.4 % — ABNORMAL LOW (ref 36.0–46.0)
Hemoglobin: 6.4 g/dL — CL (ref 12.0–15.0)
MCH: 25.6 pg — ABNORMAL LOW (ref 26.0–34.0)
MCV: 77.6 fL — ABNORMAL LOW (ref 78.0–100.0)
RBC: 2.5 MIL/uL — ABNORMAL LOW (ref 3.87–5.11)

## 2011-03-04 MED ORDER — SODIUM CHLORIDE 0.9 % IV SOLN: 100.0000 mL | INTRAVENOUS | Status: DC | PRN

## 2011-03-04 MED ORDER — LIDOCAINE HCL (PF) 1 % IJ SOLN
INTRAMUSCULAR | Status: AC
  Filled 2011-03-04: qty 5

## 2011-03-04 MED ORDER — HEPARIN SODIUM (PORCINE) 1000 UNIT/ML DIALYSIS
500.0000 [IU]/h | INTRAMUSCULAR | Status: DC | PRN
  Filled 2011-03-04: qty 1

## 2011-03-04 MED ORDER — HEPARIN SODIUM (PORCINE) 1000 UNIT/ML DIALYSIS
2000.0000 [IU] | INTRAMUSCULAR | Status: DC | PRN
  Filled 2011-03-04: qty 2

## 2011-03-04 NOTE — Progress Notes (Signed)
Subjective: Interval History: has no complaint of nausea. Patient seems to have difficulty breathing. She denies any vomiting. patient also complains of  weakness. Patientr doesn't seem to cognitive that much. Possibly in part of it may be from a language barrier.  Objective: Vital signs in last 24 hours: Temp:  [97.5 F (36.4 C)-98.7 F (37.1 C)] 98.7 F (37.1 C) (10/16 0400) Pulse Rate:  [71-106] 92  (10/16 0600) Resp:  [0-29] 15  (10/16 0600) BP: (102-157)/(79-104) 130/96 mmHg (10/16 0600) SpO2:  [93 %-100 %] 100 % (10/16 0600) Weight change:   Intake/Output from previous day: 10/15 0701 - 10/16 0700 In: 2645 [P.O.:120; I.V.:1725; IV Piggyback:800] Out: 1 [Stool:1] Intake/Output this shift:    General appearance: alert Resp: diminished breath sounds bilaterally and rhonchi apex - bilateral Cardio: regular rate and rhythm, S1, S2 normal, no murmur, click, rub or gallop GI: soft, non-tender; bowel sounds normal; no masses,  no organomegaly Extremities: Patient with significant bilateral edema.  Lab Results:  [facility] 03/04/11 0455 03/03/11 0338  WBC 12.8* 6.8  HGB 6.4* 7.5*  HCT 19.4* 22.4*  PLT 107* 114*   BMET:  Basename 03/04/11 0455 03/03/11 0755  NA 132* 133*  K 5.8* 5.7*  CL 100 101  CO2 14* 15*  GLUCOSE 91 117*  BUN 117* 108*  CREATININE 10.79* 10.74*  CALCIUM 7.6* 7.8*   No results found for this basename: PTH:2 in the last 72 hours Iron Studies: No results found for this basename: IRON,TIBC,TRANSFERRIN,FERRITIN in the last 72 hours  Studies/Results: Dg Chest 2 View  03/02/2011  *RADIOLOGY REPORT*  Clinical Data: Shortness of breath.  CHEST - 2 VIEW  Comparison: 02/11/2011  Findings: Two-view exam shows patchy airspace disease in the right base consistent with pneumonia. Cardiopericardial silhouette is at upper limits of normal for size. Interstitial markings are diffusely coarsened with chronic features. Imaged bony structures of the thorax are intact.   IMPRESSION: Patchy airspace disease at the right base consistent with pneumonia.  Original Report Authenticated By: ERIC A. MANSELL, M.D.   Mr Abdomen Wo Contrast  03/03/2011  *RADIOLOGY REPORT*  Clinical Data: Abdominal pain.  Nausea.  Question renal vein thrombosis.  MRI ABDOMEN WITHOUT CONTRAST  Technique:  Multiplanar multisequence MR imaging of the abdomen was performed. No intravenous contrast was administered.  Comparison: 03/02/2011 abdominal ultrasound.  01/10/2011 renal ultrasound.  12/17/2009 CT.  Findings: Exam is degraded by mild motion artifact and lack of IV contrast.  Small bilateral pleural effusions.  Normal heart size. Normal uninfused appearance of the liver, spleen, stomach, pancreas. Cholecystectomy without biliary ductal dilatation. Normal adrenal glands.  Mild left greater than right perirenal edema is nonspecific.  No hydronephrosis or focal renal abnormality.  Kidneys are normal in size for patient age.  10.2 cm on the left and 11.4 cm on the right.  The IVC is patent, and well evaluated on series 7.  There are 2 right renal veins. These are suboptimally evaluated secondary to technique related factors detailed above.  Felt to be both patent on series seven (images 15 and 27).  The left renal vein is patent.  Expected flow-related T2 signal (flow void) on series 13 within the renal veins and IVC.  No abdominal adenopathy or ascites.  No bowel obstruction.  There is diffuse anasarca dependently.  IMPRESSION:  1.  Mildly degraded exam secondary to motion artifact and lack of IV contrast. 2.  Given this factor, no evidence of renal vein or inferior vena cava thrombosis. 3.  Nonspecific  bilateral perirenal edema, greater left than right. Question acute or subacute renal failure. 4.  Anasarca and small bilateral pleural effusions.  Suspect fluid overload.  Original Report Authenticated By: Areta Haber, M.D.   US Abdomen Complete  03/02/2011  *RADIOLOGY REPORT*  Clinical Data: Right  upper quadrant pain.  Nausea vomiting.  ABDOMEN ULTRASOUND  Technique:  Complete abdominal ultrasound examination was performed including evaluation of the liver, gallbladder, bile ducts, pancreas, kidneys, spleen, IVC, and abdominal aorta.  Comparison: CT scan from 12/17/2009  Findings:  Gallbladder:  Surgically absent  Common Bile Duct:  Nondilated at 5 mm diameter.  Liver:  Diffuse coarsening of the hepatic echotexture suggest fatty infiltration.  IVC:  Normal.  Pancreas:  Obscured by overlying bowel gas.  Spleen:  Multiple parenchymal calcifications suggest granulomatous disease.  Right kidney:  13.7 cm in long axis.  Cortical echogenicity is increased.  Left kidney:  12.9 cm in long axis.  Poorly visualized  Abdominal Aorta:  No evidence for aneurysm  IMPRESSION: Fatty liver with increased echogenicity of the renal cortex on the right.  Left kidney is not well visualized.  Medical renal disease would be a consideration.  Original Report Authenticated By: ERIC A. MANSELL, M.D.   Nm Pulmonary Per & Vent  03/02/2011  *RADIOLOGY REPORT*  Clinical Data:  Chest pain with shortness of breath and bilateral foot swelling.  History of renal failure and systemic lupus erythematosus.  Question pulmonary embolism.  NUCLEAR MEDICINE VENTILATION - PERFUSION LUNG SCAN  Technique:  Wash-in, equilibrium, and wash-out phase ventilation images were obtained using Xe-133 gas.  Perfusion images were obtained in multiple projections after intravenous injection of Tc- 75m MAA.  Radiopharmaceuticals:  11.2 mCi Xe-133 gas and 5.0 mCi Tc-32m MAA.  Comparison:  Chest radiographs same date.  Findings: Ventilation images are within normal limits.  The perfusion study is within normal limits.  There are no segmental mismatches.  IMPRESSION: Very low probability for acute pulmonary embolism.  Original Report Authenticated By: Vivia Ewing, M.D.    I have reviewed the patient's current medications.  Assessment/Plan: Problem #1  acute renal failure superimposed on chronic habit BUN and creatinine seems to be increasing. Presently patient doesn't have any vomiting but she has some nausea. The etiology for superimposed renal failure could be prerenal syndrome versus ATN. Because of history of  nephrotic syndrome possiblity ofr renal vein thrombosis wasn't entertained patient has MRI which was negative. Problem #2 anasarca patient presently oliguric with low albumin. Patient Lasix without significant stones. Problem #3 lupus nephritis membrane Problem #4 anemia her significant her H&H seems to be declining. Problem #5 history of her hypokalemia she is status post fracture of the potassium still high Problem #6 metabolic acidosis related to her renal failure Problem #7 history of hypertension blood pressure seems to be reasonably controlled. Recommendation we'll initiate hemodialysis today And also will give her another dialysis tomorrow for 4 hours with some ultrafiltration. If patient is stable after that we'll send her to [facility] to have right hip replacement. Patient need to be typed and crossed and transfused. We'll check her CBC and basic metabolic panel in the morning.    LOS: 2 days   Bonni Neuser S 03/04/2011,7:36 AM

## 2011-03-04 NOTE — Progress Notes (Signed)
CRITICAL VALUE ALERT  Critical value received:  Troponin 0.45  Date of notification:  03/04/11  Time of notification:  0155  Critical value read back:yes  Nurse who received alert:  R.Dixie Dials  MD notified (1st page): P.Le, MD  Time of first page: 0200  MD notified (2nd page):  Time of second page:  Responding MD: P.Le,MD  Time MD responded:

## 2011-03-04 NOTE — Procedures (Signed)
Central Venous Catheter Insertion Procedure Note Jamayla Reichenberg [id number] March 13, 1976  Procedure: Insertion of Central Venous Catheter Indications: Hemodialysis  Procedure Details Consent: Risks of procedure as well as the alternatives and risks of each were explained to the (patient/caregiver).  Consent for procedure obtained. Time Out: Verified patient identification, verified procedure, site/side was marked, verified correct patient position, special equipment/implants available, medications/allergies/relevent history reviewed, required imaging and test results available.  Performed  Maximum sterile technique was used including antiseptics, cap, gloves, gown, hand hygiene, mask and sheet. Skin prep: Chlorhexidine; local anesthetic administered A antimicrobial bonded/coated double lumen catheter was placed in the right femoral vein due to patient being a dialysis patient using the Seldinger technique.  Evaluation Blood flow good Complications: No apparent complications Patient did tolerate procedure well. Chest X-ray ordered to verify placement.  CXR: Not applicable.  Vearl Aitken C 03/04/2011, 9:33 AM

## 2011-03-04 NOTE — Plan of Care (Signed)
Pt is new to HD, no OP POC

## 2011-03-05 ENCOUNTER — Inpatient Hospital Stay (HOSPITAL_COMMUNITY)
Admission: AD | Admit: 2011-03-05 | Discharge: 2011-03-22 | DRG: 673 | Disposition: A | Discharge status: Home / Self Care | Payer: Medicaid Other | Source: Other Acute Inpatient Hospital | Attending: Internal Medicine | Admitting: Internal Medicine

## 2011-03-05 HISTORY — DX: Acute kidney failure, unspecified: N18.9

## 2011-03-05 HISTORY — DX: Chronic kidney disease, unspecified: N17.9

## 2011-03-05 LAB — CARDIAC PANEL(CRET KIN+CKTOT+MB+TROPI)
Relative Index: INVALID (ref 0.0–2.5)
Total CK: 28 U/L (ref 7–177)
Troponin I: <0.3 ng/mL (ref <0.30)

## 2011-03-05 LAB — BASIC METABOLIC PANEL
BUN: 102 mg/dL — ABNORMAL HIGH (ref 6–23)
Calcium: 7.3 mg/dL — ABNORMAL LOW (ref 8.4–10.5)
Creatinine, Ser: 10 mg/dL — ABNORMAL HIGH (ref 0.50–1.10)
GFR calc non Af Amer: 5 mL/min — ABNORMAL LOW (ref >90)
Glucose, Bld: 90 mg/dL (ref 70–99)
Potassium: 5.1 mEq/L (ref 3.5–5.1)

## 2011-03-05 LAB — CBC
HCT: 22.3 % — ABNORMAL LOW (ref 36.0–46.0)
Hemoglobin: 7.8 g/dL — ABNORMAL LOW (ref 12.0–15.0)
MCV: 78.8 fL (ref 78.0–100.0)
RBC: 2.83 MIL/uL — ABNORMAL LOW (ref 3.87–5.11)
RDW: 14.7 % (ref 11.5–15.5)
WBC: 9.7 10*3/uL (ref 4.0–10.5)

## 2011-03-05 LAB — TYPE AND SCREEN
DAT, IgG: NEGATIVE
Donor AG Type: NEGATIVE
PT AG Type: NEGATIVE
Unit division: 0

## 2011-03-05 LAB — GLUCOSE, CAPILLARY: Glucose-Capillary: 97 mg/dL (ref 70–99)

## 2011-03-05 LAB — PHOSPHORUS: Phosphorus: 11.6 mg/dL — ABNORMAL HIGH (ref 2.3–4.6)

## 2011-03-05 MED ORDER — SODIUM CHLORIDE 0.9 % IJ SOLN
INTRAMUSCULAR | Status: AC
  Filled 2011-03-05: qty 20

## 2011-03-05 MED ORDER — FONDAPARINUX SODIUM 5 MG/0.4ML ~~LOC~~ SOLN: 2.5000 mg | SUBCUTANEOUS | Status: DC

## 2011-03-05 NOTE — Progress Notes (Signed)
PT BEING TRANSFER TO [place] ROOM 2112 VIA CARE LINK. ACCOMPANIED BY HUSBAND. TRANSFER REPORT CALLED TO [place] ON 2100.

## 2011-03-05 NOTE — Procedures (Signed)
Notified by Terence Lux at 16:35 that pt. Is being transferred to [facility] now, and will receive dialysis there.

## 2011-03-05 NOTE — Progress Notes (Signed)
Maria Vazquez  MRN: [id number]  DOB/AGE: 11-26-1975 35 y.o.   Admit date: 03/02/2011  Chief Complaint:  Chief Complaint  Patient presents with  . Emesis    S-Pt presented on  03/02/2011 with Emesis.     Pt was later found to be in AKI     Pt in Anasarca . Now on HD-     I had extensive discussion with pt in presence of family one of whom spoke both [place] and [place].          Marland Kitchen aspirin  325 mg Oral Daily  . cloNIDine  0.1 mg Oral BID  . fondaparinux  2.5 mg Subcutaneous Q24H  . furosemide  200 mg Intravenous BID  . influenza  inactive virus vaccine  0.5 mL Intramuscular Once  . lidocaine      . metoprolol tartrate  25 mg Oral BID  . moxifloxacin  400 mg Intravenous Q24H  . nitroGLYCERIN  1 inch Topical Q8H  . pneumococcal 23 valent vaccine  0.5 mL Intramuscular Tomorrow-1000  . DISCONTD: heparin  5,000 Units Subcutaneous Q8H         [id number] from the symptoms mentioned above,there are no other symptoms referable to all systems reviewed.  Physical Exam: Vital signs in last 24 hours: Temp:  [98.2 F (36.8 C)-100.2 F (37.9 C)] 99.3 F (37.4 C) (10/17 0800) Pulse Rate:  [84-109] 95  (10/17 0815) Resp:  [16-30] 26  (10/17 0815) BP: (104-156)/(61-101) 156/92 mmHg (10/17 0800) SpO2:  [85 %-100 %] 96 % (10/17 0815) FiO2 (%):  [50 %] 50 % (10/17 0500) Weight:  [202 lb 13.2 oz (92 kg)-208 lb 5.4 oz (94.5 kg)] 202 lb 13.2 oz (92 kg) (10/17 0500) Weight change:  Last BM Date: 03/04/11  Intake/Output from previous day: 10/16 0701 - 10/17 0700 In: 2494.2 [P.O.:240; I.V.:1000; Blood:704.2; IV Piggyback:550] Out: 2250 [Urine:250] Total I/O In: 170 [P.O.:120; IV Piggyback:50] Out: -    Physical Exam: General- pt is awake,alert, oriented to time place and person Resp- No acute REsp distress,Crackles + CVS- S1S2 regular in rate and rhythm GIT- BS+, soft, NT, ND EXT- 1+ LE Edema, Cyanosis   Lab Results: CBC  Basename 03/05/11 0417 03/04/11 0455   WBC 9.7 12.8*  HGB 7.8* 6.4*  HCT 22.3* 19.4*  PLT 53* 107*    BMET  Basename 03/05/11 0417 03/04/11 0455  NA 132* 132*  K 5.1 5.8*  CL 97 100  CO2 18* 14*  GLUCOSE 90 91  BUN 102* 117*  CREATININE 10.00* 10.79*  CALCIUM 7.3* 7.6*    MICRO Recent Results (from the past 240 hour(s))  MRSA PCR SCREENING     Status: Normal   Collection Time   03/02/11  7:19 PM      Component Value Range Status Comment   MRSA by PCR NEGATIVE  NEGATIVE  Final       Lab Results  Component Value Date   CALCIUM 7.3* 03/05/2011   PHOS 11.6* 03/05/2011       IMPRESSION:  Patchy airspace disease at the right base consistent with  pneumonia.         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               Pt NOt on Steroids secondary to Infection issues.                2)HTN Target Organ damage  CKD Medication- On RAS blockers- was on ACE On Diuretics-Lasix On Beta blockers On Vasodilators-NTG On Central Acting Sympatholytics- Clonidine   3)Haem Anemia HGb NOt  at goal (9--11)               Thrombocytopenia               Both MOst Likley secondary to SLE                  4)CKD Mineral-Bone Disorder PTH will check  Secondary Hyperparathyroidism w/u pending present /absent. Phosphorus NOT at goal.   5)Hyperlipidemia .  Ldl at goal PMD following  6)FEN  Normokalemic- Was Hyperkalemia Hyponatremic- secondary to AKIPhilomena Course to get rid of free water) Hyperolemic Albumin -Low  7)Acid base Co2 at goal     Plan:   1) Will dilayze today again. 2) Will discuss with [facility] regarding transfer as pt might benefit from repeat Biopsy and then  Depending upon the results pt may need Plasmapharesis-Pt needs Rheumatology involvement as well  3)There is  social issue of immigration/legal status and they are thinking about taking pt back to [place]. 4)Will get BMD labs as PO4 very high     Maria Vazquez S 03/05/2011, 9:17 AM

## 2011-03-05 NOTE — Progress Notes (Signed)
Subjective: Patient  Feels much better today.  Her SOB has improved.  No new complaints  Objective: Vital signs in last 24 hours: Temp:  [98.2 F (36.8 C)-100.2 F (37.9 C)] 99.3 F (37.4 C) (10/17 0800) Pulse Rate:  [84-114] 95  (10/17 0815) Resp:  [16-30] 26  (10/17 0815) BP: (104-156)/(61-101) 156/92 mmHg (10/17 0800) SpO2:  [85 %-100 %] 96 % (10/17 0815) FiO2 (%):  [50 %] 50 % (10/17 0500) Weight:  [92 kg (202 lb 13.2 oz)-94.5 kg (208 lb 5.4 oz)] 202 lb 13.2 oz (92 kg) (10/17 0500) Weight change:  Last BM Date: 03/04/11  Intake/Output from previous day: 10/16 0701 - 10/17 0700 In: 2494.2 [P.O.:240; I.V.:1000; Blood:704.2; IV Piggyback:550] Out: 2250 [Urine:250] Total I/O In: 170 [P.O.:120; IV Piggyback:50] Out: -    Physical Exam: General: Alert, awake, oriented x3, in no acute distress. HEENT: No bruits, no goiter. Heart: Regular rate and rhythm, without murmurs, rubs, gallops. Lungs: Some crackles at the bases bilaterally. Abdomen: Soft, nontender, nondistended, positive bowel sounds. Extremities: Bilateral 2+ edema of lower extremities. Neuro: Grossly intact, nonfocal.    Lab Results: Basic Metabolic Panel:  Basename 03/05/11 0417 03/05/11 0013 03/04/11 0455 03/04/11  NA 132* -- 132* --  K 5.1 -- 5.8* --  CL 97 -- 100 --  CO2 18* -- 14* --  GLUCOSE 90 -- 91 --  BUN 102* -- 117* --  CREATININE 10.00* -- 10.79* --  CALCIUM 7.3* -- 7.6* --  MG -- -- -- --  PHOS -- 11.6* -- 14.0*   Liver Function Tests:  Basename 03/03/11 1400 03/02/11 2252 03/02/11 0949  AST -- 26 11  ALT 18 19 --  ALKPHOS -- 208* 140*  BILITOT -- 0.3 0.3  PROT -- 5.5* 5.6*  ALBUMIN -- 2.5* 1.8*    Basename 03/02/11 0949  LIPASE 69*  AMYLASE --   No results found for this basename: AMMONIA:2 in the last 72 hours CBC:  Basename 03/05/11 0417 03/04/11 0455 03/02/11 0949  WBC 9.7 12.8* --  NEUTROABS -- -- 6.3  HGB 7.8* 6.4* --  HCT 22.3* 19.4* --  MCV 78.8 77.6* --  PLT  53* 107* --   Cardiac Enzymes:  Basename 03/05/11 0013 03/04/11 1533 03/04/11 0751  CKTOTAL 28 30 31   CKMB 2.0 2.5 2.9  CKMBINDEX -- -- --  TROPONINI <0.30 0.38* 0.43*   BNP: No results found for this basename: POCBNP:3 in the last 72 hours D-Dimer: No results found for this basename: DDIMER:2 in the last 72 hours CBG: No results found for this basename: GLUCAP:6 in the last 72 hours Hemoglobin A1C: No results found for this basename: HGBA1C in the last 72 hours Fasting Lipid Panel: No results found for this basename: CHOL,HDL,LDLCALC,TRIG,CHOLHDL,LDLDIRECT in the last 72 hours Thyroid Function Tests:  Basename 03/02/11 0949  TSH 1.984  T4TOTAL --  FREET4 --  T3FREE --  THYROIDAB --      Recent Results (from the past 240 hour(s))  MRSA PCR SCREENING     Status: Normal   Collection Time   03/02/11  7:19 PM      Component Value Range Status Comment   MRSA by PCR NEGATIVE  NEGATIVE  Final     Studies/Results: Mr Abdomen Wo Contrast  03/03/2011  *RADIOLOGY REPORT*  Clinical Data: Abdominal pain.  Nausea.  Question renal vein thrombosis.  MRI ABDOMEN WITHOUT CONTRAST  Technique:  Multiplanar multisequence MR imaging of the abdomen was performed. No intravenous contrast was administered.  Comparison:  03/02/2011 abdominal ultrasound.  01/10/2011 renal ultrasound.  12/17/2009 CT.  Findings: Exam is degraded by mild motion artifact and lack of IV contrast.  Small bilateral pleural effusions.  Normal heart size. Normal uninfused appearance of the liver, spleen, stomach, pancreas. Cholecystectomy without biliary ductal dilatation. Normal adrenal glands.  Mild left greater than right perirenal edema is nonspecific.  No hydronephrosis or focal renal abnormality.  Kidneys are normal in size for patient age.  10.2 cm on the left and 11.4 cm on the right.  The IVC is patent, and well evaluated on series 7.  There are 2 right renal veins. These are suboptimally evaluated secondary to  technique related factors detailed above.  Felt to be both patent on series seven (images 15 and 27).  The left renal vein is patent.  Expected flow-related T2 signal (flow void) on series 13 within the renal veins and IVC.  No abdominal adenopathy or ascites.  No bowel obstruction.  There is diffuse anasarca dependently.  IMPRESSION:  1.  Mildly degraded exam secondary to motion artifact and lack of IV contrast. 2.  Given this factor, no evidence of renal vein or inferior vena cava thrombosis. 3.  Nonspecific bilateral perirenal edema, greater left than right. Question acute or subacute renal failure. 4.  Anasarca and small bilateral pleural effusions.  Suspect fluid overload.  Original Report Authenticated By: Areta Haber, M.D.    Medications: Scheduled Meds:    . aspirin  325 mg Oral Daily  . cloNIDine  0.1 mg Oral BID  . fondaparinux  2.5 mg Subcutaneous Q24H  . furosemide  200 mg Intravenous BID  . influenza  inactive virus vaccine  0.5 mL Intramuscular Once  . lidocaine      . metoprolol tartrate  25 mg Oral BID  . moxifloxacin  400 mg Intravenous Q24H  . nitroGLYCERIN  1 inch Topical Q8H  . pneumococcal 23 valent vaccine  0.5 mL Intramuscular Tomorrow-1000  . DISCONTD: heparin  5,000 Units Subcutaneous Q8H   Continuous Infusions:  PRN Meds:.sodium chloride, heparin, heparin, hydrALAZINE, HYDROmorphone (DILAUDID) injection, labetalol, nitroGLYCERIN, ondansetron (ZOFRAN) IV, ondansetron, oxyCODONE, DISCONTD: sodium chloride  Assessment/Plan:  Principal Problem:  *ARF (acute renal failure) Pt had HD yesterday per nephrology.  Pt to have HD again today.  Active Problems:  SOB (shortness of breath): Resolved, secondary to hypertensive urgency.   Bilateral swelling of feet/ Anasarca: Patient has nephrotic syndrome,  Due to lupus nephropathy.   Lupus (systemic lupus erythematosus): Prednisone has been discontinued. Will need a rheumatology consult as outpatient.    Chest pain  at rest:  off nitroglycerin drip. CP resolved.   Hypertensive urgency: Resolved, BP is now stable. Continue Clonidine and Metoprolol,  Also continue hydralazine prn.   Pneumonia: Continue with Avelox.  Elevated Troponin: Discussed with [facility] cardiology Dr Domenic Polite, troponin is elevated due to strain pattern, secondary to hypertensive urgency.  Troponin trending down.  Echo reviewed.  Thrombocytopenia: Platelets trending down, D/C heparin.  Start Arixtra , check HIT panel and use SCD.   LOS: 3 days   Rosana Hoes MD, Sharlet Salina 03/05/2011, 9:02 AM

## 2011-03-05 NOTE — Significant Event (Signed)
Patient tolerated 4L [place] from 2000 to 0500 with oxygen saturation from 92-94%.  At 0500 she requested the VM due to discomfort with nasal canula.   At 0500 HA to left side occipital rated an 8, no other deficits, no drift, pupils PERRLA, no slurred speech.  Dilaudid 1mg  iv given for pain.  Oxygen noted to be around 87-88%, slowly increased VM from 35% to 50% for Saturation of 92%.  Notified MD on call.

## 2011-03-05 NOTE — Plan of Care (Signed)
Pt. Is new to dialysis. No outpatient plan of care

## 2011-03-06 ENCOUNTER — Inpatient Hospital Stay (HOSPITAL_COMMUNITY): Payer: Medicaid Other

## 2011-03-06 LAB — CARDIAC PANEL(CRET KIN+CKTOT+MB+TROPI)
CK, MB: 2.4 ng/mL (ref 0.3–4.0)
CK, MB: 2.5 ng/mL (ref 0.3–4.0)
Relative Index: INVALID (ref 0.0–2.5)
Total CK: 46 U/L (ref 7–177)
Total CK: 41 U/L (ref 7–177)
Troponin I: <0.3 ng/mL (ref <0.30)
Troponin I: <0.3 ng/mL (ref <0.30)

## 2011-03-06 LAB — CBC
Hemoglobin: 9.6 g/dL — ABNORMAL LOW (ref 12.0–15.0)
MCV: 78 fL (ref 78.0–100.0)
Platelets: 48 10*3/uL — ABNORMAL LOW (ref 150–400)
RBC: 3.5 MIL/uL — ABNORMAL LOW (ref 3.87–5.11)
WBC: 16.1 10*3/uL — ABNORMAL HIGH (ref 4.0–10.5)

## 2011-03-06 LAB — ANTIPHOSPHOLIPID SYNDROME EVAL, BLD
Anticardiolipin IgA: 18 APL U/mL (ref <22)
DRVVT: 56.7 secs — ABNORMAL HIGH (ref 34.1–42.2)
Drvvt confirmation: 1.4 Ratio — ABNORMAL HIGH (ref <1.16)
Lupus Anticoagulant: DETECTED — AB
Phosphatydalserine, IgG: 6 U/mL (ref <16)
Phosphatydalserine, IgM: 8 U/mL (ref <22)

## 2011-03-06 LAB — DIFFERENTIAL
Basophils Absolute: 0 10*3/uL (ref 0.0–0.1)
Basophils Relative: 0 % (ref 0–1)
Eosinophils Absolute: 0 10*3/uL (ref 0.0–0.7)
Lymphocytes Relative: 5 % — ABNORMAL LOW (ref 12–46)
Monocytes Relative: 2 % — ABNORMAL LOW (ref 3–12)
Neutrophils Relative %: 93 % — ABNORMAL HIGH (ref 43–77)

## 2011-03-06 LAB — RENAL FUNCTION PANEL
Albumin: 3 g/dL — ABNORMAL LOW (ref 3.5–5.2)
Calcium: 8.2 mg/dL — ABNORMAL LOW (ref 8.4–10.5)
Creatinine, Ser: 11.07 mg/dL — ABNORMAL HIGH (ref 0.50–1.10)
GFR calc non Af Amer: 4 mL/min — ABNORMAL LOW (ref >90)
Phosphorus: 13.6 mg/dL — ABNORMAL HIGH (ref 2.3–4.6)

## 2011-03-06 LAB — DIC (DISSEMINATED INTRAVASCULAR COAGULATION)PANEL: Platelets: 40 10*3/uL — ABNORMAL LOW (ref 150–400)

## 2011-03-07 ENCOUNTER — Inpatient Hospital Stay (HOSPITAL_COMMUNITY): Payer: Medicaid Other

## 2011-03-07 DIAGNOSIS — N186 End stage renal disease: Secondary | ICD-10-CM

## 2011-03-07 LAB — RENAL FUNCTION PANEL
Calcium: 8.5 mg/dL (ref 8.4–10.5)
GFR calc Af Amer: 5 mL/min — ABNORMAL LOW (ref >90)
Glucose, Bld: 146 mg/dL — ABNORMAL HIGH (ref 70–99)
Phosphorus: 11.7 mg/dL — ABNORMAL HIGH (ref 2.3–4.6)
Sodium: 134 mEq/L — ABNORMAL LOW (ref 135–145)

## 2011-03-07 LAB — HEPARIN INDUCED THROMBOCYTOPENIA PNL
Heparin Induced Plt Ab: NEGATIVE
Patient O.D.: 0.393
UFH Low Dose 0.5 IU/mL: 0 % Release
UFH SRA Result: NEGATIVE

## 2011-03-07 LAB — DIFFERENTIAL
Basophils Relative: 0 % (ref 0–1)
Eosinophils Absolute: 0 10*3/uL (ref 0.0–0.7)
Monocytes Absolute: 0.3 10*3/uL (ref 0.1–1.0)
Monocytes Relative: 2 % — ABNORMAL LOW (ref 3–12)
Neutro Abs: 9.7 10*3/uL — ABNORMAL HIGH (ref 1.7–7.7)

## 2011-03-07 LAB — PTH, INTACT AND CALCIUM
Calcium, Total (PTH): 7.9 mg/dL — ABNORMAL LOW (ref 8.4–10.5)
PTH: 287.8 pg/mL — ABNORMAL HIGH (ref 14.0–72.0)

## 2011-03-08 ENCOUNTER — Inpatient Hospital Stay (HOSPITAL_COMMUNITY): Payer: Medicaid Other

## 2011-03-08 LAB — RENAL FUNCTION PANEL
Albumin: 2.5 g/dL — ABNORMAL LOW (ref 3.5–5.2)
Calcium: 8.4 mg/dL (ref 8.4–10.5)
GFR calc Af Amer: 7 mL/min — ABNORMAL LOW (ref >90)
GFR calc non Af Amer: 6 mL/min — ABNORMAL LOW (ref >90)
Phosphorus: 7.9 mg/dL — ABNORMAL HIGH (ref 2.3–4.6)
Potassium: 4.8 mEq/L (ref 3.5–5.1)
Sodium: 134 mEq/L — ABNORMAL LOW (ref 135–145)

## 2011-03-08 LAB — DIFFERENTIAL
Basophils Absolute: 0 10*3/uL (ref 0.0–0.1)
Eosinophils Absolute: 0 10*3/uL (ref 0.0–0.7)
Lymphocytes Relative: 11 % — ABNORMAL LOW (ref 12–46)
Lymphs Abs: 1.5 10*3/uL (ref 0.7–4.0)
Neutro Abs: 10.5 10*3/uL — ABNORMAL HIGH (ref 1.7–7.7)

## 2011-03-08 LAB — CBC
MCH: 27.7 pg (ref 26.0–34.0)
MCHC: 35.7 g/dL (ref 30.0–36.0)
Platelets: 83 10*3/uL — ABNORMAL LOW (ref 150–400)

## 2011-03-09 ENCOUNTER — Inpatient Hospital Stay (HOSPITAL_COMMUNITY): Payer: Medicaid Other

## 2011-03-09 LAB — RENAL FUNCTION PANEL
BUN: 105 mg/dL — ABNORMAL HIGH (ref 6–23)
CO2: 24 mEq/L (ref 19–32)
Calcium: 8.2 mg/dL — ABNORMAL LOW (ref 8.4–10.5)
GFR calc Af Amer: 11 mL/min — ABNORMAL LOW (ref >90)
Glucose, Bld: 130 mg/dL — ABNORMAL HIGH (ref 70–99)
Glucose, Bld: 110 mg/dL — ABNORMAL HIGH (ref 70–99)
Phosphorus: 10.3 mg/dL — ABNORMAL HIGH (ref 2.3–4.6)
Potassium: 5.3 mEq/L — ABNORMAL HIGH (ref 3.5–5.1)
Sodium: 138 mEq/L (ref 135–145)

## 2011-03-09 LAB — DIFFERENTIAL
Basophils Relative: 0 % (ref 0–1)
Eosinophils Absolute: 0 10*3/uL (ref 0.0–0.7)
Lymphs Abs: 1 10*3/uL (ref 0.7–4.0)
Monocytes Absolute: 0.8 10*3/uL (ref 0.1–1.0)
Neutro Abs: 9 10*3/uL — ABNORMAL HIGH (ref 1.7–7.7)

## 2011-03-09 LAB — CBC
HCT: 21.9 % — ABNORMAL LOW (ref 36.0–46.0)
Hemoglobin: 8.8 g/dL — ABNORMAL LOW (ref 12.0–15.0)
Hemoglobin: 7.5 g/dL — ABNORMAL LOW (ref 12.0–15.0)
MCH: 27.1 pg (ref 26.0–34.0)
MCH: 27.3 pg (ref 26.0–34.0)
MCHC: 35.1 g/dL (ref 30.0–36.0)
MCHC: 34.2 g/dL (ref 30.0–36.0)
MCV: 79.6 fL (ref 78.0–100.0)

## 2011-03-09 NOTE — Op Note (Signed)
NAME:  ALEXANDER, CRITCHLEY NO.:  [id number]  MEDICAL RECORD NO.:  [id number]  LOCATION:  [id number]                         FACILITY:  [facility]  PHYSICIAN:  Conrad Slope, MD       DATE OF BIRTH:  February 24, 1976  DATE OF PROCEDURE:  03/08/2011 DATE OF DISCHARGE:                              OPERATIVE REPORT   PROCEDURE:  Right internal jugular vein cannulation under ultrasound guidance, placement of a right internal jugular vein tunneled dialysis catheter.  PREOPERATIVE DIAGNOSIS:  Acute renal failure.  POSTOPERATIVE DIAGNOSIS:  Acute renal failure.  SURGEON:  Conrad Hatteras, MD  FINDINGS:  Tips of the tunneled dialysis catheter in the right atrium.  ANESTHESIA:  Anesthesia care and local.  SPECIMENS:  None.  ESTIMATED BLOOD LOSS:  Minimal.  INDICATIONS:  This is a 35 year old female who presents with acute onset of renal failure secondary to lupus.  The Nephrology Service is attempting renal salvage with steroids, antirejection medications, and dialysis.  Hence, they requested placement of a tunneled dialysis catheter to facilitate this process.  The patient is aware of the risks of the procedure including bleeding, infection, possible central venous injury, and possible pneumothorax.  She is aware of these risks and agreed to proceed forward.  DESCRIPTION OF OPERATION:  After full informed written consent was obtained from the patient, she was brought back to the operating room and placed supine upon the operating table.  Prior to induction, he received IV antibiotics.  He was then prepped and draped in the standard fashion for a neck or chest tunneled dialysis catheter placement after obtaining adequate sedation.  I turned my attention to his right neck and under ultrasound guidance, I identified a right internal jugular vein that was patent.  This was cannulated with an 18-gauge needle.  A wire was passed down into the right ventricle.  I then clamped the wire to  the drapes and then anesthetized the cannulation site, the tunnel route and also the exit site with a total about 30 mL of 1% lidocaine without epinephrine.  I then made stab incisions at the exit site and cannulation sites and then dissected from the chest to the neck with a metal tunneler and dilated the subcutaneous tunnel with a plastic dilator.  I then unclamped the wire, removed the needle, and then serially dilated up the skin tract and venotomy with serial dilators and then finally placed the dilator sheath over the wire under fluoroscopic guidance into the superior vena cava.  The wire and dilator were removed.   A 23-cm Diatek catheter was loaded through the sheath down into the right atrium.  We then broke the sheath and peeled away the sheath while holding the cuff of the catheter at the level of skin.  The back end of this catheter was connected to the metal tunneler and passed in a retrograde fashion through the subcutaneous tunnel.  I then pulled this catheter to appropriate location under fluoroscopic guidance.  The tips ofthe catheter were in the right atrium.  There were no kinks in the catheter noted.  I then transected the back end of this catheter, revealing 2 lumens of this catheter.  The 2 ports were docked onto these 2 lumens.  The catheter  hub was screwed into place.  I then tested each port by aspirating and flushing each port with no resistance and loaded  each port with heparinized saline.  The catheter was secured in place with  2 interrupted stitches of nylon tied to the catheter.  The neck incision was then repaired with a U-stitch of 4-0 Monocryl at this point, then each port was loaded with concentrated heparin, at 1000 units/mL concentration, at the manufacturer recommended volumes and then each port was sterilely capped.  Sterile bandages were applied to all incision  sites.  The patient tolerated this procedure well.  COMPLICATION:  None.  CONDITION:   Stable.     Conrad King, MD     BLC/MEDQ  D:  03/08/2011  T:  03/08/2011  Job:  [id number]  Electronically Signed by Adele Barthel MD on 03/09/2011 03:19:45 PM

## 2011-03-10 LAB — DIFFERENTIAL
Basophils Absolute: 0 10*3/uL (ref 0.0–0.1)
Basophils Relative: 0 % (ref 0–1)
Eosinophils Absolute: 0 10*3/uL (ref 0.0–0.7)
Eosinophils Relative: 0 % (ref 0–5)
Lymphocytes Relative: 12 % (ref 12–46)
Lymphs Abs: 1.6 10*3/uL (ref 0.7–4.0)
Monocytes Absolute: 1.2 10*3/uL — ABNORMAL HIGH (ref 0.1–1.0)
Monocytes Relative: 9 % (ref 3–12)
Neutro Abs: 10.5 10*3/uL — ABNORMAL HIGH (ref 1.7–7.7)
Neutrophils Relative %: 79 % — ABNORMAL HIGH (ref 43–77)

## 2011-03-10 LAB — RENAL FUNCTION PANEL
CO2: 24 mEq/L (ref 19–32)
Calcium: 8.4 mg/dL (ref 8.4–10.5)
Chloride: 100 mEq/L (ref 96–112)
Creatinine, Ser: 7.29 mg/dL — ABNORMAL HIGH (ref 0.50–1.10)
GFR calc Af Amer: 8 mL/min — ABNORMAL LOW (ref >90)
Glucose, Bld: 119 mg/dL — ABNORMAL HIGH (ref 70–99)
Sodium: 136 mEq/L (ref 135–145)

## 2011-03-10 LAB — HEPARIN INDUCED THROMBOCYTOPENIA PNL
Heparin Induced Plt Ab: NEGATIVE
UFH Low Dose 0.1 IU/mL: 0 % Release
UFH Low Dose 0.5 IU/mL: 8 % Release
UFH SRA Result: NEGATIVE

## 2011-03-10 LAB — GLUCOSE, CAPILLARY: Glucose-Capillary: 105 mg/dL — ABNORMAL HIGH (ref 70–99)

## 2011-03-10 LAB — CBC
Hemoglobin: 6.9 g/dL — CL (ref 12.0–15.0)
RBC: 2.53 MIL/uL — ABNORMAL LOW (ref 3.87–5.11)
WBC: 13.3 10*3/uL — ABNORMAL HIGH (ref 4.0–10.5)

## 2011-03-10 LAB — FERRITIN: Ferritin: 3 ng/mL — ABNORMAL LOW (ref 10–291)

## 2011-03-10 LAB — PREPARE RBC (CROSSMATCH)

## 2011-03-10 LAB — IRON AND TIBC: Iron: 91 ug/dL (ref 42–135)

## 2011-03-11 ENCOUNTER — Inpatient Hospital Stay (HOSPITAL_COMMUNITY): Payer: Medicaid Other

## 2011-03-11 LAB — RENAL FUNCTION PANEL
Albumin: 2.7 g/dL — ABNORMAL LOW (ref 3.5–5.2)
CO2: 24 mEq/L (ref 19–32)
Calcium: 8.6 mg/dL (ref 8.4–10.5)
Creatinine, Ser: 8.77 mg/dL — ABNORMAL HIGH (ref 0.50–1.10)
GFR calc Af Amer: 6 mL/min — ABNORMAL LOW (ref >90)
GFR calc non Af Amer: 5 mL/min — ABNORMAL LOW (ref >90)
Phosphorus: 8.4 mg/dL — ABNORMAL HIGH (ref 2.3–4.6)
Sodium: 135 mEq/L (ref 135–145)

## 2011-03-11 LAB — TYPE AND SCREEN
ABO/RH(D): O POS
Antibody Screen: POSITIVE
DAT, IgG: NEGATIVE
Unit division: 0

## 2011-03-11 LAB — CBC
MCH: 27.8 pg (ref 26.0–34.0)
MCHC: 34.4 g/dL (ref 30.0–36.0)
Platelets: 109 10*3/uL — ABNORMAL LOW (ref 150–400)
RBC: 3.45 MIL/uL — ABNORMAL LOW (ref 3.87–5.11)
RDW: 15.2 % (ref 11.5–15.5)

## 2011-03-11 LAB — HEPATIC FUNCTION PANEL
ALT: 9 U/L (ref 0–35)
Bilirubin, Direct: 0.2 mg/dL (ref 0.0–0.3)
Indirect Bilirubin: 0.4 mg/dL (ref 0.3–0.9)
Total Bilirubin: 0.6 mg/dL (ref 0.3–1.2)

## 2011-03-12 LAB — DIFFERENTIAL
Basophils Absolute: 0 10*3/uL (ref 0.0–0.1)
Basophils Relative: 0 % (ref 0–1)
Eosinophils Absolute: 0.1 10*3/uL (ref 0.0–0.7)
Monocytes Absolute: 0.8 10*3/uL (ref 0.1–1.0)
Monocytes Relative: 5 % (ref 3–12)
Neutro Abs: 14.8 10*3/uL — ABNORMAL HIGH (ref 1.7–7.7)
Neutrophils Relative %: 83 % — ABNORMAL HIGH (ref 43–77)

## 2011-03-12 LAB — RENAL FUNCTION PANEL
Albumin: 2.5 g/dL — ABNORMAL LOW (ref 3.5–5.2)
Calcium: 8.2 mg/dL — ABNORMAL LOW (ref 8.4–10.5)
GFR calc Af Amer: 7 mL/min — ABNORMAL LOW (ref >90)
GFR calc non Af Amer: 6 mL/min — ABNORMAL LOW (ref >90)
Glucose, Bld: 84 mg/dL (ref 70–99)
Phosphorus: 7.5 mg/dL — ABNORMAL HIGH (ref 2.3–4.6)
Potassium: 4.9 mEq/L (ref 3.5–5.1)
Sodium: 137 mEq/L (ref 135–145)

## 2011-03-12 LAB — CBC
Hemoglobin: 9.1 g/dL — ABNORMAL LOW (ref 12.0–15.0)
MCH: 28 pg (ref 26.0–34.0)
MCHC: 33.3 g/dL (ref 30.0–36.0)
Platelets: 62 10*3/uL — ABNORMAL LOW (ref 150–400)

## 2011-03-12 LAB — AMYLASE: Amylase: 211 U/L — ABNORMAL HIGH (ref 0–105)

## 2011-03-12 NOTE — Consult Note (Signed)
NAME:  Maria Vazquez, MAPES NO.:  [id number]  MEDICAL RECORD NO.:  [id number]  LOCATION:  [id number]                         FACILITY:  [facility]  PHYSICIAN:  Conrad , MD       DATE OF BIRTH:  Jan 11, 1976  DATE OF CONSULTATION:  03/07/2011 DATE OF DISCHARGE:                                CONSULTATION   REASON FOR CONSULTATION:  PermCath placement secondary to acute renal failure.  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  She has been dialyzing for several days via a right femoral temporary catheter.  The Nephrology Services indicated that she will continue to require hemodialysis in at least the short- term and therefore requested placement of a PermCath.  The patient's past medical history is also significant for gallstone pancreatitis. She was initially evaluated and treated at [facility] and then transferred to [facility] on March 05, 2011 where she is being followed by the Internal Medicine and Renal Services.  She currently denies chest pain, shortness of breath, nausea, vomiting, diarrhea, constipation, joint pain, and claudication symptoms.  She has been having issues with nausea and vomiting, as well as diarrhea in the recent past.  She does speak and understand English but it is limited.  PAST MEDICAL HISTORY: 1. 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 2. Gallstone pancreatitis. 3. Lupus which was diagnosed in August 2012.  PAST SURGICAL HISTORY:  Cesarean section x3.  ALLERGIES:  No known drug allergies.  CURRENT INPATIENT MEDICATIONS: 1. Pantoprazole 40 mg IV daily. 2. PhosLo 1334 mg p.o. t.i.d. 3. Clonidine 0.1 mg p.o. b.i.d. 4. CellCept 1500 mg p.o. b.i.d. 5. Aranesp 100 mcg IV Thursday with HD. 6. Solu-Medrol 500 mg IV daily. 7. Morphine 2 mg IV q.4 p.r.n. 8. Zofran 4 mg  IV q.8 h. p.r.n.  SOCIAL HISTORY:  The patient denies tobacco and alcohol use.  She also denies illicit drug use.  She is married and her husband is present with her.  FAMILY HISTORY:  Positive for diabetes but negative for renal disease.  REVIEW OF SYSTEMS:  The patient currently states she is comfortable. Please see HPI for pertinent positives and negatives, otherwise review of systems is negative.  PHYSICAL EXAMINATION:  VITAL SIGNS:  Blood pressure 146/99, heart rate 81, sinus rhythm, temperature 98.2, and O2 saturation 97% on 4 L via nasal cannula. GENERAL:  The patient is resting comfortably and appears her stated age. She is alert and oriented x3.  She speaks very quietly. HEENT:  PERRLA.  EOMI.  Normocephalic and atraumatic. CARDIAC:  Regular rate and rhythm. LUNGS:  Clear to auscultation. ABDOMEN:  Soft, nondistended with active bowel sounds. EXTREMITIES:  Warm and well perfused.  She has 2+ radial pulses present bilaterally.  Motor and sensation is intact in all 4 extremities.  She does have a right femoral Vas Cath in place. SKIN:  No evidence of rashes or skin breakdown. NEUROLOGIC:  Nonfocal.  LABORATORY DATA:  CBC and BMP on March 07, 2011:  White count 10.8, hemoglobin 8.8, hematocrit 25.1, and platelets 274.  Sodium 134, potassium 4.8, BUN 105, and creatinine  9.78.  Previous lupus labs were positive per admitting H and P.  Portable chest x-ray on March 02, 2011:  Mild bibasilar infiltrates, improved.  MRI of the abdomen without contrast on March 03, 2011:  Mildly degraded exam secondary to motion artifact and lack of IV contrast.  No evidence of renal vein or inferior vena cava thrombosis.  Nonspecific bilateral perirenal edema greater left than right.  Question acute or subacute renal failure and small bilateral pleural effusions.  V-P scan, March 02, 2011:  Very low probability for acute pulmonary embolism.  Abdominal ultrasound, March 02, 2011:  Fatty liver with increased echogenicity of the renal cortex on the right.  Left kidney is not well visualized.  Medical renal disease would be considered.  ASSESSMENT:  Membranous lupus nephritis with acute renal insufficiency requiring hemodialysis.  She is currently dialyzing via a right femoral temporary catheter.  PLAN:  The patient ate this morning and is going for hemodialysis today, therefore, she will be scheduled for placement of a right or left Perma- Cath on March 08, 2011 by Dr. Bridgett Larsson.     Leta Baptist, PA   ______________________________ Conrad Dover, MD    AY/MEDQ  D:  03/07/2011  T:  03/07/2011  Job:  [id number]  Electronically Signed by Leta Baptist PA on 03/10/2011 02:24:00 PM Electronically Signed by Adele Barthel MD on 03/12/2011 08:25:45 AM

## 2011-03-13 ENCOUNTER — Inpatient Hospital Stay (HOSPITAL_COMMUNITY): Payer: Medicaid Other

## 2011-03-13 DIAGNOSIS — N189 Chronic kidney disease, unspecified: Secondary | ICD-10-CM

## 2011-03-13 LAB — DIFFERENTIAL
Basophils Absolute: 0 10*3/uL (ref 0.0–0.1)
Basophils Relative: 0 % (ref 0–1)
Eosinophils Absolute: 0 10*3/uL (ref 0.0–0.7)
Eosinophils Relative: 0 % (ref 0–5)
Lymphocytes Relative: 11 % — ABNORMAL LOW (ref 12–46)
Monocytes Absolute: 0.6 10*3/uL (ref 0.1–1.0)

## 2011-03-13 LAB — RENAL FUNCTION PANEL
Albumin: 2.6 g/dL — ABNORMAL LOW (ref 3.5–5.2)
BUN: 68 mg/dL — ABNORMAL HIGH (ref 6–23)
Chloride: 100 mEq/L (ref 96–112)
GFR calc non Af Amer: 6 mL/min — ABNORMAL LOW (ref >90)
Phosphorus: 6.9 mg/dL — ABNORMAL HIGH (ref 2.3–4.6)
Potassium: 4.4 mEq/L (ref 3.5–5.1)
Sodium: 137 mEq/L (ref 135–145)

## 2011-03-13 LAB — CBC
HCT: 23.7 % — ABNORMAL LOW (ref 36.0–46.0)
MCHC: 32.9 g/dL (ref 30.0–36.0)
Platelets: 98 10*3/uL — ABNORMAL LOW (ref 150–400)
RDW: 15.4 % (ref 11.5–15.5)
WBC: 14.3 10*3/uL — ABNORMAL HIGH (ref 4.0–10.5)

## 2011-03-13 LAB — GLUCOSE, CAPILLARY: Glucose-Capillary: 145 mg/dL — ABNORMAL HIGH (ref 70–99)

## 2011-03-13 NOTE — Consult Note (Signed)
NAMEMarland Kitchen  Maria Vazquez, Maria Vazquez NO.:  [id number]  MEDICAL RECORD NO.:  [id number]  LOCATION:  2112                         FACILITY:  [facility]  PHYSICIAN:  Louis Meckel, M.D.DATE OF BIRTH:  01/24/1976  DATE OF CONSULTATION: DATE OF DISCHARGE:                                CONSULTATION   REQUESTING PHYSICIAN:  Sorin Marye Round, MD  REASON FOR CONSULTATION:  Acute renal failure and membranous lupus nephritis.  HISTORY OF PRESENT ILLNESS:  Ms. Maria Vazquez is a 35 year old Hispanic female with past medical history significant for history of gallstone pancreatitis.  She apparently presented with nephrotic syndrome in late August and serologies were found to be positive for lupus.  She underwent a renal biopsy on January 13, 2011, reportedly consistent with membranous glomerular nephritis question if SLE related. The patient was given treatment with Lasix and Vasotec but no immunosuppressant therapy given by primary nephrologist apparently because the patient did not have medication coverage.  Creatinine was 0.99 on January 14, 2011, and then on January 23, 2011, was 1.29, and on February 11, 2011, was 2.0.  She then presented with nausea, vomiting, worsening swelling.  On March 02, 2011, BUN and creatinine were found to be 102 and 10.  Urinalysis showed 4+ protein.  Microscopic exam showed hematuria and granular casts.  The patient has been watched at [facility] since March 02, 2011.  Immunosuppressants were considered and it appears that prednisone 60 mg was given on March 02, 2011, but was not continued.  The patient was diagnosed with possible pneumonia. She also underwent an abdominal MRI that showed no evidence of renal vein thrombosis.  The patient had a Vas-Cath and got 1 hemodialysis treatment on March 04, 2011, but I cannot find dialysis flow sheet. The patient was sent over here for further evaluation and treatment. Presently, she is sleepy but  arousable and denies significant pain. Communication is difficult due to language barrier.  PAST MEDICAL HISTORY: 1. Recent nephrotic syndrome as described above. 2. History of gallstone pancreatitis. 3. Lupus which has only been diagnosed in August 2012.  She denied     rashes or joint complaints but had decreased compliment, positive     ANA, and positive anti-DNA.  She has anemia and thrombocytopenia     and also serologic evidence for lupus anticoagulant.  MEDICATIONS:  It is difficult to track but appeared to be the following: 1. Aspirin 325 mg daily. 2. Clonidine 0.1 mg b.i.d. 3. Lasix 200 mg b.i.d. 4. Hydralazine 10 mg IV p.r.n. 5. Dilaudid. 6. Labetalol 10 mg IV p.r.n. 7. Lopressor 25 mg b.i.d. 8. Avelox 400 mg daily. 9. Zofran p.r.n. 10.Prednisone 60 mg which was given on March 02, 2011.  ALLERGIES:  No drug allergies.  SOCIAL HISTORY:  From the chart no smoking, alcohol or drug use.  She is married and husband is at her bedside today.  Apparently, both of them can understand English but not speak so well.  FAMILY HISTORY:  Positive for diabetes.  Negative for lupus or renal disease.  REVIEW OF SYSTEMS:  Pain in the head and neck, positive for edema, shortness of breath, cough.  Negative for abdominal pain.  Previously, had nausea and vomiting, also, previously had diarrhea.  Denies any joint complaints  or rashes, fevers, chills.  States that she is still making urine and denies dysuria or gross hematuria.  The remainder of the review of systems is negative.  PHYSICAL EXAMINATION:  VITAL SIGNS:  The patient is afebrile.  Blood pressure 146/94, heart rate is 90, respirations 14, oxygen saturation is 100% on 50% Ventimask. GENERAL:  The patient looks like she does not feel well but is arousable. HEENT:  Pupils are equal, round, reactive to light.  Mucous membranes are moist. NECK:  There is no jugular venous distention. LUNGS:  Coarse breath sounds bilaterally  without wheezes. CARDIOVASCULAR EXAM:  Tachycardic.  No murmur, gallop, or rub. ABDOMEN:  Soft, nontender, nondistended. EXTREMITIES:  Slightly edematous but no overt pitting edema.  She has a right femoral Vas-Cath in place. NEURO:  The patient is sleepy but arousable.  LABORATORY DATA:  Today, sodium 132, potassium 5.1, bicarb 18, BUN and creatinine 102/10, glucose is 90, calcium 7.3, phosphorus 11.6.  White blood count 9.7, hemoglobin 7.8, platelets 53.  Hepatitis B serologies are negative.  TSH is normal.  Albumin is 1.8.  Urinalysis on March 02, 2011, greater than 300 protein and large blood, 11-20 rbc's and granular casts.  Pregnancy test was negative.  Previous lupus labs were markedly positive.  MRI showed no evidence of renal vein thrombosis.  Chest x-ray showed patchy airspace disease at right base.  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 1. Membranous lupus nephritis, slightly unusual to have such     significant renal decline over 6 weeks, question contribution of      possible acute tubular necrosis given granular     casts.  In any event, needs aggressive therapy for kidney disease     to have hope of kidney function salvage.  I will start high dose steroids and     CellCept which the literature supports.  There is no absolute     indication for dialysis tonight.  We will follow for clinical need     and monitor urine output. 2. Anemia and thrombocytopenia likely related to systemic lupus     erythematosus, steroids may help.  Continue with supportive care     and transfuse as necessary and also start Aranesp. 3. Hyperphosphatemia- start binders. 4. Lupus anticoagulant.  Consider systemic anticoagulation.     Rheumatology to see the patient also, so we will defer to them.  Thank you for consultation.  We will follow.          ______________________________ Louis Meckel, M.D.     KAG/MEDQ  D:  03/05/2011  T:  03/06/2011  Job:  [id number]  Electronically Signed by Corliss Parish M.D. on 03/13/2011 06:32:12 AM

## 2011-03-14 LAB — RENAL FUNCTION PANEL
Albumin: 2.6 g/dL — ABNORMAL LOW (ref 3.5–5.2)
Calcium: 8.7 mg/dL (ref 8.4–10.5)
Creatinine, Ser: 6.28 mg/dL — ABNORMAL HIGH (ref 0.50–1.10)
GFR calc Af Amer: 9 mL/min — ABNORMAL LOW (ref >90)
GFR calc non Af Amer: 8 mL/min — ABNORMAL LOW (ref >90)
Sodium: 137 mEq/L (ref 135–145)

## 2011-03-14 LAB — DIFFERENTIAL
Basophils Relative: 0 % (ref 0–1)
Eosinophils Absolute: 0 10*3/uL (ref 0.0–0.7)
Eosinophils Relative: 0 % (ref 0–5)
Lymphs Abs: 1.4 10*3/uL (ref 0.7–4.0)
Monocytes Relative: 5 % (ref 3–12)
Neutrophils Relative %: 86 % — ABNORMAL HIGH (ref 43–77)

## 2011-03-14 LAB — CBC
MCH: 28 pg (ref 26.0–34.0)
MCV: 84 fL (ref 78.0–100.0)
Platelets: 107 10*3/uL — ABNORMAL LOW (ref 150–400)
RDW: 15.6 % — ABNORMAL HIGH (ref 11.5–15.5)
WBC: 15.8 10*3/uL — ABNORMAL HIGH (ref 4.0–10.5)

## 2011-03-14 NOTE — Progress Notes (Signed)
NAME:  Maria Vazquez, Maria Vazquez NO.:  [id number]  MEDICAL RECORD NO.:  [id number]  LOCATION:                                 FACILITY:  PHYSICIAN:  Faye Ramsay, MD      DATE OF BIRTH:                                PROGRESS NOTE   CURRENT MEDICAL PROBLEMS: 1. 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 2. Lupus. 3. Anemia of chronic disease. 4. Hypertension. 5. Thrombocytopenia. 6. New since today March 11, 2011, abdominal pain.  CURRENT MEDICATIONS DURING THE HOSPITALIZATION: 1. Calcium acetate 3 times daily with meals. 2. Clonidine 0.1-mg tablet twice daily. 3. CellCept 1500-mg tablet twice daily. 4. Protonix 40-mg tablet daily. 5. Prednisone 60-mg tablet daily. 6. Phenergan as needed for nausea. 7. Zofran as needed for nausea. 8. Morphine sulfate 2 mg IV as needed for pain.  DIAGNOSTIC TESTS DURING THE HOSPITALIZATION: Abdominal ultrasound on March 02, 2011, fatty liver with increased echogenicity of the renal cortex of the right kidney medical renal disease.  Chest x-ray on March 02, 2011, patchy airspace disease at the right base, consistent with pneumonia.  V/Q scan on March 02, 2011, very low probability for acute pulmonary embolism.  On March 03, 2011, MR of the abdomen without contrast, mildly degraded exam secondary to motion artifact and the lack of IV contrast, nonspecific bilateral perirenal edema, greater on the left than right, question of acute and subacute renal failure, anasarca, and small bilateral pleural effusions. Operative report performed by Dr. Bridgett Larsson.  Right internal jugular vein cannulation under ultrasound guidance, placement of the right internal jugular vein tunneled dialysis catheter by Dr. Bridgett Larsson on March 08, 2011.  CONSULTATIONS: Renal Service and Surgery, Conrad Clyde, MD  CURRENT PHYSICAL EXAMINATION: VITAL SIGNS:  Temperature 98.4, pulse 70, respirations 21,  blood pressure 120-173/106, saturating 93% on room air. GENERAL:  Lying in bed, not in acute distress. CARDIOVASCULAR:  Regular rate and rhythm.  S1, S2 present.  No murmurs, rubs, or gallops. LUNGS:  Decreased sounds at bases.  No wheezing, rhonchi, or rales. ABDOMEN:  Epigastric tenderness.  Normal bowel sounds.  Nondistended. No guarding or rebound tenderness. EXTREMITIES:  +1 bilateral pitting edema. NEUROLOGIC:  Grossly nonfocal.  LABORATORY DATA: WBC 24.4, hemoglobin 9.6, hematocrit 27.9, platelets 109.  Sodium 135, potassium 4.6, chloride 96, bicarb 24, glucose 96, BUN 96, creatinine 8.77, and albumin 2.7, calcium 8.6, phosphorus 8.4.  HOSPITAL COURSE BY PROBLEM: 1. End-stage renal disease secondary to lupus nephritis, membranous     glomerulonephritis.  The patient was started on prednisone and     CellCept.  Temporary hemodialysis catheter was placed and     hemodialysis was initiated.  The patient has been tolerating     hemodialysis treatment well.  Renal Service is following. 2. Lupus - the patient will continue to take prednisone.  She was     started on CellCept.  There is questionable intolerance to     CellCept.  This was discussed this with Dr. Justin Mend and decision was     made to discontinue CellCept for now given the patient's abdominal     pain and poor oral intake.  There was worry for pancreatitis. 3. Abdominal pain with  poor oral intake - we will check lipase and     LFTs.  A CellCept has acute pancreatitis side effect, this will be     temporarily discontinued.  We will check lipase and we will follow     up on results.  This was discussed with Dr. Justin Mend and he will     discontinue CellCept. 4. Pneumonia - the patient has received adequate antibiotics and     discussed results. 5. Hypertension.  The patient has had relatively stable blood     pressures during the hospitalization, we are continuing clonidine     for now. 6. Anemia of chronic disease, likely  secondary to #1 and #2.     Hemoglobin currently stable at the patient's baseline.  We will     continue to obtain CBC to ensure that hemoglobin and hematocrit is     at the patient's baseline.  Over 30 minutes spent on today's visit with the patient.     Faye Ramsay, MD     IM/MEDQ  D:  03/11/2011  T:  03/11/2011  Job:  [id number]  Electronically Signed by Faye Ramsay MD on 03/14/2011 03:06:27 PM

## 2011-03-15 ENCOUNTER — Inpatient Hospital Stay (HOSPITAL_COMMUNITY): Payer: Medicaid Other

## 2011-03-15 ENCOUNTER — Encounter (HOSPITAL_COMMUNITY): Payer: Self-pay | Admitting: Radiology

## 2011-03-15 DIAGNOSIS — J9600 Acute respiratory failure, unspecified whether with hypoxia or hypercapnia: Secondary | ICD-10-CM

## 2011-03-15 DIAGNOSIS — J810 Acute pulmonary edema: Secondary | ICD-10-CM

## 2011-03-15 DIAGNOSIS — M329 Systemic lupus erythematosus, unspecified: Secondary | ICD-10-CM

## 2011-03-15 DIAGNOSIS — J84113 Idiopathic non-specific interstitial pneumonitis: Secondary | ICD-10-CM

## 2011-03-15 LAB — BLOOD GAS, ARTERIAL
Drawn by: 347621
FIO2: 0.1 %
pCO2 arterial: 33.9 mmHg — ABNORMAL LOW (ref 35.0–45.0)
pH, Arterial: 7.504 — ABNORMAL HIGH (ref 7.350–7.400)
pO2, Arterial: 57.3 mmHg — ABNORMAL LOW (ref 80.0–100.0)

## 2011-03-15 LAB — BASIC METABOLIC PANEL
BUN: 54 mg/dL — ABNORMAL HIGH (ref 6–23)
Chloride: 100 mEq/L (ref 96–112)
Creatinine, Ser: 8.13 mg/dL — ABNORMAL HIGH (ref 0.50–1.10)
GFR calc Af Amer: 7 mL/min — ABNORMAL LOW (ref >90)
Glucose, Bld: 85 mg/dL (ref 70–99)

## 2011-03-15 LAB — RENAL FUNCTION PANEL
BUN: 53 mg/dL — ABNORMAL HIGH (ref 6–23)
CO2: 27 mEq/L (ref 19–32)
Calcium: 8 mg/dL — ABNORMAL LOW (ref 8.4–10.5)
Creatinine, Ser: 7.98 mg/dL — ABNORMAL HIGH (ref 0.50–1.10)
GFR calc non Af Amer: 6 mL/min — ABNORMAL LOW (ref >90)

## 2011-03-15 LAB — DIFFERENTIAL
Basophils Absolute: 0 10*3/uL (ref 0.0–0.1)
Basophils Relative: 0 % (ref 0–1)
Eosinophils Absolute: 0.1 10*3/uL (ref 0.0–0.7)
Monocytes Relative: 4 % (ref 3–12)
Neutrophils Relative %: 86 % — ABNORMAL HIGH (ref 43–77)

## 2011-03-15 LAB — CBC
HCT: 21.8 % — ABNORMAL LOW (ref 36.0–46.0)
MCH: 27.7 pg (ref 26.0–34.0)
MCV: 85.2 fL (ref 78.0–100.0)
Platelets: 116 10*3/uL — ABNORMAL LOW (ref 150–400)
RBC: 2.56 MIL/uL — ABNORMAL LOW (ref 3.87–5.11)
RDW: 15.8 % — ABNORMAL HIGH (ref 11.5–15.5)

## 2011-03-15 LAB — GLUCOSE, CAPILLARY
Glucose-Capillary: 84 mg/dL (ref 70–99)
Glucose-Capillary: 106 mg/dL — ABNORMAL HIGH (ref 70–99)

## 2011-03-15 MED ORDER — IOHEXOL 300 MG/ML  SOLN
100.0000 mL | Freq: Once | INTRAMUSCULAR | Status: AC | PRN
  Administered 2011-03-15: 100 mL via INTRAVENOUS

## 2011-03-16 ENCOUNTER — Inpatient Hospital Stay (HOSPITAL_COMMUNITY): Payer: Medicaid Other

## 2011-03-16 LAB — CROSSMATCH
DAT, IgG: NEGATIVE
Donor AG Type: NEGATIVE
Unit division: 0
Unit division: 0

## 2011-03-16 LAB — RENAL FUNCTION PANEL
Albumin: 2.7 g/dL — ABNORMAL LOW (ref 3.5–5.2)
BUN: 27 mg/dL — ABNORMAL HIGH (ref 6–23)
Chloride: 98 mEq/L (ref 96–112)
GFR calc non Af Amer: 12 mL/min — ABNORMAL LOW (ref >90)
Phosphorus: 5.4 mg/dL — ABNORMAL HIGH (ref 2.3–4.6)
Potassium: 4.8 mEq/L (ref 3.5–5.1)

## 2011-03-16 LAB — GLUCOSE, CAPILLARY
Glucose-Capillary: 104 mg/dL — ABNORMAL HIGH (ref 70–99)
Glucose-Capillary: 108 mg/dL — ABNORMAL HIGH (ref 70–99)
Glucose-Capillary: 153 mg/dL — ABNORMAL HIGH (ref 70–99)

## 2011-03-16 LAB — PROCALCITONIN: Procalcitonin: 0.65 ng/mL

## 2011-03-16 LAB — PRO B NATRIURETIC PEPTIDE: Pro B Natriuretic peptide (BNP): >70000 pg/mL — ABNORMAL HIGH (ref 0–125)

## 2011-03-17 ENCOUNTER — Inpatient Hospital Stay (HOSPITAL_COMMUNITY): Payer: Medicaid Other

## 2011-03-17 DIAGNOSIS — M329 Systemic lupus erythematosus, unspecified: Secondary | ICD-10-CM

## 2011-03-17 DIAGNOSIS — J9600 Acute respiratory failure, unspecified whether with hypoxia or hypercapnia: Secondary | ICD-10-CM

## 2011-03-17 DIAGNOSIS — N186 End stage renal disease: Secondary | ICD-10-CM

## 2011-03-17 DIAGNOSIS — N170 Acute kidney failure with tubular necrosis: Secondary | ICD-10-CM

## 2011-03-17 LAB — RENAL FUNCTION PANEL
Albumin: 2.7 g/dL — ABNORMAL LOW (ref 3.5–5.2)
CO2: 24 mEq/L (ref 19–32)
Calcium: 8.7 mg/dL (ref 8.4–10.5)
Calcium: 8.6 mg/dL (ref 8.4–10.5)
Creatinine, Ser: 6.91 mg/dL — ABNORMAL HIGH (ref 0.50–1.10)
GFR calc Af Amer: 9 mL/min — ABNORMAL LOW (ref >90)
GFR calc Af Amer: 8 mL/min — ABNORMAL LOW (ref >90)
GFR calc non Af Amer: 8 mL/min — ABNORMAL LOW (ref >90)
GFR calc non Af Amer: 7 mL/min — ABNORMAL LOW (ref >90)
Glucose, Bld: 130 mg/dL — ABNORMAL HIGH (ref 70–99)
Phosphorus: 6.5 mg/dL — ABNORMAL HIGH (ref 2.3–4.6)
Phosphorus: 7.7 mg/dL — ABNORMAL HIGH (ref 2.3–4.6)
Potassium: 4.7 mEq/L (ref 3.5–5.1)
Sodium: 134 mEq/L — ABNORMAL LOW (ref 135–145)
Sodium: 135 mEq/L (ref 135–145)

## 2011-03-17 LAB — CBC
MCH: 28.3 pg (ref 26.0–34.0)
MCV: 85.2 fL (ref 78.0–100.0)
Platelets: 132 10*3/uL — ABNORMAL LOW (ref 150–400)
RDW: 14.8 % (ref 11.5–15.5)

## 2011-03-17 LAB — GLUCOSE, CAPILLARY
Glucose-Capillary: 127 mg/dL — ABNORMAL HIGH (ref 70–99)
Glucose-Capillary: 130 mg/dL — ABNORMAL HIGH (ref 70–99)
Glucose-Capillary: 144 mg/dL — ABNORMAL HIGH (ref 70–99)

## 2011-03-18 DIAGNOSIS — M329 Systemic lupus erythematosus, unspecified: Secondary | ICD-10-CM

## 2011-03-18 DIAGNOSIS — N170 Acute kidney failure with tubular necrosis: Secondary | ICD-10-CM

## 2011-03-18 DIAGNOSIS — J9600 Acute respiratory failure, unspecified whether with hypoxia or hypercapnia: Secondary | ICD-10-CM

## 2011-03-18 LAB — GLUCOSE, CAPILLARY: Glucose-Capillary: 127 mg/dL — ABNORMAL HIGH (ref 70–99)

## 2011-03-18 LAB — RENAL FUNCTION PANEL
CO2: 29 mEq/L (ref 19–32)
Chloride: 100 mEq/L (ref 96–112)
Creatinine, Ser: 3.86 mg/dL — ABNORMAL HIGH (ref 0.50–1.10)
GFR calc non Af Amer: 14 mL/min — ABNORMAL LOW (ref >90)

## 2011-03-18 LAB — PRO B NATRIURETIC PEPTIDE: Pro B Natriuretic peptide (BNP): >70000 pg/mL — ABNORMAL HIGH (ref 0–125)

## 2011-03-18 LAB — PROCALCITONIN: Procalcitonin: 0.5 ng/mL

## 2011-03-19 ENCOUNTER — Inpatient Hospital Stay (HOSPITAL_COMMUNITY): Payer: Medicaid Other

## 2011-03-19 LAB — CBC
Platelets: 138 10*3/uL — ABNORMAL LOW (ref 150–400)
RBC: 3.1 MIL/uL — ABNORMAL LOW (ref 3.87–5.11)
RDW: 14.8 % (ref 11.5–15.5)
WBC: 13.6 10*3/uL — ABNORMAL HIGH (ref 4.0–10.5)

## 2011-03-19 LAB — RENAL FUNCTION PANEL
Albumin: 2.8 g/dL — ABNORMAL LOW (ref 3.5–5.2)
Chloride: 98 mEq/L (ref 96–112)
GFR calc Af Amer: 9 mL/min — ABNORMAL LOW (ref >90)
GFR calc non Af Amer: 8 mL/min — ABNORMAL LOW (ref >90)
Potassium: 4.9 mEq/L (ref 3.5–5.1)
Sodium: 136 mEq/L (ref 135–145)

## 2011-03-19 LAB — GLUCOSE, CAPILLARY
Glucose-Capillary: 162 mg/dL — ABNORMAL HIGH (ref 70–99)
Glucose-Capillary: 92 mg/dL (ref 70–99)

## 2011-03-19 LAB — POCT I-STAT 4, (NA,K, GLUC, HGB,HCT): Sodium: 131 mEq/L — ABNORMAL LOW (ref 135–145)

## 2011-03-19 NOTE — Discharge Summary (Signed)
NAME:  Maria Vazquez, Maria Vazquez    ACCOUNT NO.:  [id number]  MEDICAL RECORD NO.:  [id number]  LOCATION:  IC10                          FACILITY:  [facility]  PHYSICIAN:  Sharlet Salina, M.D.   DATE OF BIRTH:  02-22-1976  DATE OF ADMISSION:  03/02/2011 DATE OF DISCHARGE:                              DISCHARGE SUMMARY   TRANSFER DATE:  March 05, 2011  DISCHARGE DIAGNOSES: 1. Acute renal failure. 2. Lupus. 3. Dyspnea secondary to acute renal failure. 4. Bilateral lower extremity edema secondary to renal failure. 5. Proteinuria. 6. Anemia. 7. Chest pain. 8. Hypertensive urgency. 9. Pneumonia. 10.Thrombocytopenia. 11.Elevated troponin.  DISCHARGE MEDICATIONS: 1. Aspirin 325 mg daily. 2. Clonidine 0.1 b.i.d. 3. Arixtra 2.5 mg subcu q.24 hours. 4. Lasix 200 mg b.i.d. 5. Metoprolol 25 mg b.i.d. 6. Avelox 400 mg daily. 7. Nitroglycerin patch 1 inch q.8 hours.  FOLLOW UP APPOINTMENT:  The patient is being transferred to [facility] for seizures, insertion of central venous catheter done on March 04, 2011, hemodialysis done on March 04, 2011.  Ultrasound done on March 02, 2011, of the abdomen showed fatty liver with increased echogenicity of the renal cortex on the right and left kidneys not well-visualized medical renal disease.  MRI of the abdomen shows mild degraded exam secondary to motion artifact and lack of IV contrast. Given this fact, no evidence of renal vein or inferior vena cava thrombosis, nonspecific bilateral perirenal edema, greater left and right, question of acute or subacute renal failure, anasarca, and small bilateral pleural effusions, suspected volume overload.  Chest x-ray showed patchy airspace disease at the right base consistent with pneumonia.  CONSULTANT:  Nephrology, Alison Murray, MD  HISTORY OF PRESENT ILLNESS:  The patient is a 35 year old female, who was recently discharged from the hospital after she was diagnosed of  a nephrotic range proteinuria.  The patient was seen in the Dr. Florentina Addison office outpatient.  At the last admission, the patient was also diagnosed with lupus.  The patient had renal biopsy done at [facility] and was found to have membranous glomerulonephritis.  The patient was sent home on Lasix and ACE inhibitor.  As per Dr. Lowanda Foster, the patient had no insurance and could not afford CellCept, so she was not started on immunosuppressants.  At that time, the patient had been having nausea or vomiting for about a month, initially was thought to be due to p.o. iron that she was taking.  The patient also developed diarrhea for the last 10 days.  She has had 3 stools per day.  The patient also complains of chest pain, nausea, or vomiting.  The patient also has been having worsening edema.  Please see admission note for remainder of history.  PAST MEDICAL HISTORY:  Per admission H and P.  FAMILY HISTORY:  Per admission H and P.  SOCIAL HISTORY:  Per admission H and P.  MEDICATIONS:  Per admission H and P.  ALLERGIES:  Per admission H and P.  REVIEW OF SYSTEMS:  Per admission H and P.  PHYSICAL EXAMINATION:  VITAL SIGNS:  At the time of discharge, temperature is 99.3, pulse 95, respirations 26, and blood pressure 156/92, pulse ox 96% on 50% FiO2.  Intake is 2494 and output 2250. GENERAL:  The patient is laying in bed and well-nourished Spanish female. HEENT:  Normocephalic and atraumatic.  Pupils were reactive to light. Throat without erythema. CARDIOVASCULAR:  Regular rate and rhythm. LUNGS:  She has crackles at the bases. ABDOMEN:  Soft, nontender, and nondistended.  Positive bowel sounds. EXTREMITIES:  Edema 2+ bilaterally.  HOSPITAL COURSE: 1. Acute renal failure:  The patient was admitted to the hospital.     Nephrology, Dr. Lowanda Foster was consulted secondary to acute kidney     injury on chronic kidney disease.  Per Dr. Lowanda Foster, the patient     could have ATN versus RPGN  versus chronic kidney disease     progression with proteinuria.  The patient was now started on     steroids secondary to the pneumonia.  Dr. Lowanda Foster, recommend     possibly transferring the patient to facility where she could     possibly get another biopsy and possible plasmapheresis if     indicated.  The patient was treated with 1 round of dialysis here     and was given 2 units of blood secondary to anemia and case was     discussed with nephrology at [facility] Dr. Mercy Moore.  The patient     will be transferred to [facility] today and will receive dialysis     when she gets to [facility]. 2. Anemia: The patient had hemoglobin of 6, possibly related to bone     marrow suppression versus kidney disease.  She had guaiac stools.     The patient has 2 guaiac done that was negative.  The patient     received 2 units of packed red blood cells during her dialysis     yesterday.  She however has some emesis that looks more coffee-     ground, so if she has not had any more emesis, but if she continues     to vomit, then she could have occult blood checked in her emesis to     see if she does have blood in her vomit; although, I suspect that     her anemia is probably related to her kidney disease. 3. Thrombocytopenia:  The patient also is noted to have     thrombocytopenia that is progressively platelet declining.  She was     on heparin and changed her to Arixtra and HIT panel is pending. 4. Lupus: The patient was diagnosed with lupus on her last admission.     The patient most likely will need a rheumatology consult when she     transfers to [facility]. 5. Pneumonia: The patient is on Avelox IV for pneumonia.  She received     the pneumococcal vaccine and influenza vaccine. 6. Hypertension.  The patient's blood pressure is fairly controlled on     the clonidine, the Lasix, metoprolol, and nitroglycerin patch. 7. Slightly elevated cardiac markers and chest pain:  Case was     discussed  with cardiology per rounding physicians note and per     cardiology, her elevated enzyme is most likely troponin leak     secondary to the renal disease volume overload.  LABORATORY DATA:  At the time of discharge, sodium 132, potassium 5.1, chloride 97, CO2 of 18, BUN 102, and creatinine 10.  WBC 9.7, hemoglobin 7.8, MCV 78.8, and platelet of 53.  Blood sugar of 90.  Time spent with family and during this transfer discharge is approximately and talking to consultants is approximately 1 hour.  Sharlet Salina, M.D.     NJ/MEDQ  D:  03/05/2011  T:  03/05/2011  Job:  [id number]  Electronically Signed by Sharlet Salina M.D. on 03/19/2011 09:51:43 PM

## 2011-03-20 DIAGNOSIS — N170 Acute kidney failure with tubular necrosis: Secondary | ICD-10-CM

## 2011-03-20 DIAGNOSIS — J9600 Acute respiratory failure, unspecified whether with hypoxia or hypercapnia: Secondary | ICD-10-CM

## 2011-03-20 DIAGNOSIS — M329 Systemic lupus erythematosus, unspecified: Secondary | ICD-10-CM

## 2011-03-20 LAB — CBC
MCHC: 32.5 g/dL (ref 30.0–36.0)
MCV: 87.1 fL (ref 78.0–100.0)
Platelets: 107 10*3/uL — ABNORMAL LOW (ref 150–400)
RDW: 15 % (ref 11.5–15.5)
WBC: 9.9 10*3/uL (ref 4.0–10.5)

## 2011-03-20 LAB — RENAL FUNCTION PANEL
Albumin: 2.7 g/dL — ABNORMAL LOW (ref 3.5–5.2)
BUN: 26 mg/dL — ABNORMAL HIGH (ref 6–23)
Chloride: 101 mEq/L (ref 96–112)
Creatinine, Ser: 4.41 mg/dL — ABNORMAL HIGH (ref 0.50–1.10)
GFR calc non Af Amer: 12 mL/min — ABNORMAL LOW (ref >90)
Phosphorus: 2.9 mg/dL (ref 2.3–4.6)
Potassium: 3.5 mEq/L (ref 3.5–5.1)

## 2011-03-20 LAB — GLUCOSE, CAPILLARY: Glucose-Capillary: 130 mg/dL — ABNORMAL HIGH (ref 70–99)

## 2011-03-20 NOTE — Op Note (Signed)
NAME:  ASJA, STAKE NO.:  [id number]  MEDICAL RECORD NO.:  [id number]  LOCATION:  2604                         FACILITY:  [facility]  PHYSICIAN:  Conrad Gibson, MD       DATE OF BIRTH:  07-10-1975  DATE OF PROCEDURE:  03/17/2011 DATE OF DISCHARGE:                              OPERATIVE REPORT   PROCEDURE:  Placement of a right radiocephalic arteriovenous fistula.  PREOPERATIVE DIAGNOSIS:  Acute renal failure.  POSTOP DIAGNOSIS:  Acute renal failure.  SURGEON:  Conrad [place], MD.  ANESTHESIA:  General.  FINDINGS:  In this case included a small radial artery.  This was only 2-2.5 mm externally.  There was  weakly palpable thrill at the end of the case and a dopplerable radial signal.  SPECIMENS:  None.  ESTIMATED BLOOD LOSS:  Minimal.  INDICATIONS:  This is a 35 year old patient who acutely went into renal failure due to her lupus.  Previously I had placed a tunneled dialysis catheter as an attempt to salvage her renal function.  Unfortunately it appears as if her end-stage renal disease may become permanent. Subsequently her nephrologist requests placement of permanent access. Based on her vein mapping, options included a radiocephalic and brachiocephalic.  Note that both of these veins had been recently punctured. Unfortunately her left arm has no fistula options and unfortunately I was forced to subsequently use the right arm which has recently been punctured multiple times.  The patient is aware of the risks of this procedure includes bleeding, infection, possible steal syndrome, possible ischemic monomeric neuropathy, possible nerve damage, possible failure to mature, and possible need for additional procedures. She is aware of these risks and agreed to proceed forward.  DESCRIPTION OF THE OPERATION:  After full informed written consent was obtained from the patient, she was brought back to the operating room, placed supine upon the operating table.   Prior to induction, she had received IV antibiotics as part of her therapeutic regimen and also was redosed as she had not recently been given a antibiotic dose within the last hour.  After obtaining adequate anesthesia, she was prepped and draped in the standard fashion for a right arm access procedure.  I injected about 5 mL of 1% lidocaine with epinephrine at the level of the wrist and made a transverse incision, dissected down to the radial artery. Note even in dissecting through the subcutaneous tissue, there was significant amount of hematoma here consistent with previous attempts at some type of puncture, whether arterial puncture or venous. I was able  to dissect down to the radial artery proximally and distally to gain control.  This is a small radial artery only 2-2.5 mm in diameter, but soft and I felt attempt at radiocephalic would be acceptable.  I then dissected laterally down the cephalic vein branch that was about 2.5 mm in diameter externally.  This was dissected proximally and distally.  I ligated the distal component and transected the vein, tied off the vein with a 2-0 silk, then interrogated this vein.  There was actually excellent back bleeding and the vein accepted   a 3 mm dilator without any resistance.  Subsequently I felt this was acceptable for an attempt at a radiocephalic arteriovenous fistula.  I injected heparinized saline into it.  Then I reset my exposure.  Then I clamped the artery proximally and distally with serrefine clamps, made a arteriotomy with #11 blade, extended with Potts scissor for about a 3.5 mm arteriotomy.  I then sewed the vein to the artery with a running stitch of 7-0 Prolene.  Prior to completing this anastomosis, I allowed all vessels to back bleed.  There was excellent backbleeding without any clots.  The anastomosis was completed in theusual fashion.  I placed some thrombin and Gelfoam around the wound and then held some gentle  pressure. Meanwhile I interrogated the fistula.  There was a weakly palpable thrill that had actually more of a pulsatile component to it.  I listened distally to the radial artery.  There was a reasonable signal within the radial artery which did not augment that significantly with compression of the outflow.  The outflow had a flow signature that was consistent with widely patent radiocephalic arteriovenous fistula.  At this point, I took out the thrombin and Gelfoam, and irrigated out the incision.  There was no more active bleeding.  Then I reapproximated the subcutaneous tissue with a running stitch of 3-0 Vicryl.  The skin was then reapproximated with a running subcuticular 4-0 Monocryl.  The skin was then cleaned, dried, and then reinforced with Dermabond.  The patient tolerated this procedure fine.  Complications: none  Condition: stable    Conrad [place], MD     BLC/MEDQ  D:  03/17/2011  T:  03/17/2011  Job:  [id number]  Electronically Signed by Adele Barthel MD on 03/20/2011 07:19:53 PM

## 2011-03-21 ENCOUNTER — Inpatient Hospital Stay (HOSPITAL_COMMUNITY): Payer: Medicaid Other

## 2011-03-21 LAB — RENAL FUNCTION PANEL
Albumin: 2.8 g/dL — ABNORMAL LOW (ref 3.5–5.2)
BUN: 47 mg/dL — ABNORMAL HIGH (ref 6–23)
CO2: 27 mEq/L (ref 19–32)
Calcium: 8.3 mg/dL — ABNORMAL LOW (ref 8.4–10.5)
Chloride: 99 mEq/L (ref 96–112)
Creatinine, Ser: 6.43 mg/dL — ABNORMAL HIGH (ref 0.50–1.10)
GFR calc Af Amer: 9 mL/min — ABNORMAL LOW (ref >90)
GFR calc non Af Amer: 8 mL/min — ABNORMAL LOW (ref >90)
GFR calc non Af Amer: 8 mL/min — ABNORMAL LOW (ref >90)
Phosphorus: 3.3 mg/dL (ref 2.3–4.6)
Potassium: 3.6 mEq/L (ref 3.5–5.1)
Sodium: 137 mEq/L (ref 135–145)

## 2011-03-21 LAB — CBC
MCV: 87.1 fL (ref 78.0–100.0)
Platelets: 103 10*3/uL — ABNORMAL LOW (ref 150–400)
RBC: 3.03 MIL/uL — ABNORMAL LOW (ref 3.87–5.11)
RDW: 14.9 % (ref 11.5–15.5)
WBC: 12.4 10*3/uL — ABNORMAL HIGH (ref 4.0–10.5)

## 2011-03-21 LAB — GLUCOSE, CAPILLARY
Glucose-Capillary: 183 mg/dL — ABNORMAL HIGH (ref 70–99)
Glucose-Capillary: 150 mg/dL — ABNORMAL HIGH (ref 70–99)

## 2011-03-22 ENCOUNTER — Inpatient Hospital Stay (HOSPITAL_COMMUNITY): Payer: Medicaid Other

## 2011-03-22 LAB — GLUCOSE, CAPILLARY
Glucose-Capillary: 109 mg/dL — ABNORMAL HIGH (ref 70–99)
Glucose-Capillary: 84 mg/dL (ref 70–99)

## 2011-03-22 LAB — RENAL FUNCTION PANEL
Albumin: 2.6 g/dL — ABNORMAL LOW (ref 3.5–5.2)
BUN: 23 mg/dL (ref 6–23)
Chloride: 101 mEq/L (ref 96–112)
GFR calc non Af Amer: 12 mL/min — ABNORMAL LOW (ref >90)
Potassium: 3.8 mEq/L (ref 3.5–5.1)

## 2011-03-22 MED ORDER — INSULIN ASPART 100 UNIT/ML ~~LOC~~ SOLN
0.0000 [IU] | Freq: Three times a day (TID) | SUBCUTANEOUS | Status: DC
  Filled 2011-03-22: qty 3

## 2011-03-22 MED ORDER — PROMETHAZINE HCL 25 MG/ML IJ SOLN: 12.5000 mg | Freq: Four times a day (QID) | INTRAMUSCULAR | Status: DC | PRN

## 2011-03-22 MED ORDER — PROMETHAZINE HCL 25 MG RE SUPP: 25.0000 mg | Freq: Two times a day (BID) | RECTAL | Status: DC | PRN

## 2011-03-22 MED ORDER — CAMPHOR-MENTHOL 0.5-0.5 % EX LOTN
TOPICAL_LOTION | CUTANEOUS | Status: DC | PRN
  Filled 2011-03-22: qty 222

## 2011-03-22 MED ORDER — ACETAMINOPHEN 325 MG PO TABS: 650.0000 mg | ORAL_TABLET | Freq: Four times a day (QID) | ORAL | Status: DC | PRN

## 2011-03-22 MED ORDER — NITROGLYCERIN 0.4 MG SL SUBL: 0.4000 mg | SUBLINGUAL_TABLET | SUBLINGUAL | Status: DC | PRN

## 2011-03-22 MED ORDER — DARBEPOETIN ALFA-POLYSORBATE 100 MCG/0.5ML IJ SOLN: 100.0000 ug | INTRAMUSCULAR | Status: DC

## 2011-03-22 MED ORDER — ZOLPIDEM TARTRATE 5 MG PO TABS: 5.0000 mg | ORAL_TABLET | Freq: Every evening | ORAL | Status: DC | PRN

## 2011-03-22 MED ORDER — ENOXAPARIN SODIUM 30 MG/0.3ML ~~LOC~~ SOLN
30.0000 mg | SUBCUTANEOUS | Status: DC
  Filled 2011-03-22: qty 0.3

## 2011-03-22 MED ORDER — BIOTENE DRY MOUTH MT LIQD: 15.0000 mL | Freq: Two times a day (BID) | OROMUCOSAL | Status: DC

## 2011-03-22 MED ORDER — LISINOPRIL 10 MG PO TABS
10.0000 mg | ORAL_TABLET | Freq: Every day | ORAL | Status: DC
  Filled 2011-03-22 (×2): qty 1

## 2011-03-22 MED ORDER — POLYETHYLENE GLYCOL 3350 17 G PO PACK
17.0000 g | PACK | Freq: Every day | ORAL | Status: DC
  Filled 2011-03-22 (×2): qty 1

## 2011-03-22 MED ORDER — PANTOPRAZOLE SODIUM 40 MG PO TBEC: 40.0000 mg | DELAYED_RELEASE_TABLET | Freq: Two times a day (BID) | ORAL | Status: DC

## 2011-03-22 MED ORDER — CLONIDINE HCL 0.1 MG PO TABS
0.1000 mg | ORAL_TABLET | Freq: Two times a day (BID) | ORAL | Status: DC
  Filled 2011-03-22 (×4): qty 1

## 2011-03-22 MED ORDER — DOCUSATE SODIUM 283 MG RE ENEM
1.0000 | ENEMA | RECTAL | Status: DC | PRN
  Filled 2011-03-22: qty 1

## 2011-03-22 MED ORDER — HYDROXYZINE HCL 25 MG PO TABS
25.0000 mg | ORAL_TABLET | Freq: Three times a day (TID) | ORAL | Status: DC | PRN
  Filled 2011-03-22: qty 1

## 2011-03-22 MED ORDER — CALCIUM ACETATE 667 MG PO CAPS
1334.0000 mg | ORAL_CAPSULE | Freq: Three times a day (TID) | ORAL | Status: DC
  Filled 2011-03-22 (×5): qty 2

## 2011-03-22 MED ORDER — PREDNISONE 20 MG PO TABS
20.0000 mg | ORAL_TABLET | Freq: Once | ORAL | Status: DC
  Filled 2011-03-22: qty 1

## 2011-03-22 MED ORDER — LABETALOL HCL 100 MG PO TABS
100.0000 mg | ORAL_TABLET | Freq: Two times a day (BID) | ORAL | Status: DC
  Filled 2011-03-22 (×4): qty 1

## 2011-03-22 MED ORDER — SORBITOL 70 % SOLN
30.0000 mL | Status: DC | PRN
  Filled 2011-03-22: qty 30

## 2011-03-22 MED ORDER — TRAMADOL HCL 50 MG PO TABS: 50.0000 mg | ORAL_TABLET | Freq: Two times a day (BID) | ORAL | Status: DC | PRN

## 2011-03-22 MED ORDER — ONDANSETRON HCL 4 MG PO TABS: 4.0000 mg | ORAL_TABLET | Freq: Four times a day (QID) | ORAL | Status: DC | PRN

## 2011-03-22 MED ORDER — ACETAMINOPHEN 650 MG RE SUPP: 650.0000 mg | Freq: Four times a day (QID) | RECTAL | Status: DC | PRN

## 2011-03-22 MED ORDER — MORPHINE SULFATE 2 MG/ML IJ SOLN: 2.0000 mg | INTRAMUSCULAR | Status: DC | PRN

## 2011-03-22 MED ORDER — CALCIUM CARBONATE 1250 MG/5ML PO SUSP
500.0000 mg | Freq: Four times a day (QID) | ORAL | Status: DC | PRN
  Filled 2011-03-22: qty 5

## 2011-03-22 MED ORDER — NEPRO/CARBSTEADY PO LIQD
237.0000 mL | Freq: Three times a day (TID) | ORAL | Status: DC | PRN
  Filled 2011-03-22: qty 237

## 2011-03-22 MED ORDER — ONDANSETRON HCL 4 MG/2ML IJ SOLN: 4.0000 mg | Freq: Three times a day (TID) | INTRAMUSCULAR | Status: DC | PRN

## 2011-03-22 MED ORDER — PROMETHAZINE HCL 12.5 MG PO TABS: 12.5000 mg | ORAL_TABLET | Freq: Four times a day (QID) | ORAL | Status: DC | PRN

## 2011-03-22 MED ORDER — PREDNISONE 10 MG PO TABS
10.0000 mg | ORAL_TABLET | Freq: Every day | ORAL | Status: DC
  Filled 2011-03-22: qty 1

## 2011-03-22 MED ORDER — HYDRALAZINE HCL 20 MG/ML IJ SOLN
10.0000 mg | Freq: Three times a day (TID) | INTRAMUSCULAR | Status: DC | PRN
  Filled 2011-03-22: qty 0.5

## 2011-03-24 NOTE — Discharge Summary (Signed)
  NAMEMarland Kitchen  TENASIA, Maria NO.:  [id number]  MEDICAL RECORD NO.:  [id number]  LOCATION:  [id number]                         FACILITY:  [facility]  PHYSICIAN:  Edythe Lynn, M.D.       DATE OF BIRTH:  Jun 25, 1975  DATE OF ADMISSION:  03/05/2011 DATE OF DISCHARGE:  03/22/2011                              DISCHARGE SUMMARY   PRIMARY CARE PHYSICIAN:  [facility].  DISCHARGE DIAGNOSES: 1. 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 2. Systemic lupus erythematosus. 3. Anemia of chronic disease. 4. Hypertension. 5. Thrombocytopenia.  DISCHARGE MEDICATIONS: 1. Nephro-Vite 1 tablet daily. 2. Labetalol 100 mg twice a day. 3. Calcium acetate 367 mg 2 tablets 3 times a day with meals. 4. Clonidine 0.1 mg twice a day. 5. Aranesp 100 mcg with dialysis on Wednesday. 6. Lisinopril 10 mg daily. 7. Zofran 4 mg every 6 hours as needed for nausea. 8. Prednisone 10 mg daily. CONDITION AT DISCHARGE:  Maria Vazquez is discharged in good condition. She is going to have hemodialysis at the [facility] Tuesday, Thursday, and Saturday.  For complete list of procedures, refer to dictated progress note and discharge summaries.  On March 17, 2011, the patient underwent also right radiocephalic arteriovenous fistula by Dr. Adele Barthel.  CONSULTATION THIS ADMISSION:  The patient was seen in consultation by [facility] and the vascular vein specialist for dialysis access.  BRIEF SYNOPSIS OF THE HOSPITAL COURSE:  This 35 year old patient was transferred from [facility] on March 05, 2011.  She presented there on March 02, 2011, with a membranous glomerulonephritis induced by systemic lupus erythematosus.  She had worsening renal failure and respiratory problems.  She had a hemoglobin of 6 upon admission.  She also was thrombocytopenic.  She had a BUN of 102, a creatinine  of 10. Upon arrival to [facility], she was seen in the emergency room by Dr. Rolan Lipa.  She recommended the patient be started on CellCept and high doses steroids.  She watched the patient closely during the hospitalization.  On March 12, 2011, the patient had a progress note which mentioned the fact that she was already on hemodialysis.  She was continued on CellCept for her lupus.  On March 17, 2011, the patient had a fistula placed for hemodialysis which will mature in the future.  During this prolonged hospitalization, at some point in time, the patient was in the intensive care unit under the critical care medicine service.  On March 15, 2011, she also had a CT angio of the chest which was negative for pulmonary emboli.  She never required any intubation for her volume overload due to acute respiratory failure.  She never developed any severe sepsis.  She was treated for possible pneumonia at the beginning of the hospitalization but later in the hospitalization, it was not an issue.  Overall for the last week of hospitalization, the patient was hemodynamically stable awaiting an outpatient dialysis unit spot.     Edythe Lynn, M.D.     SL/MEDQ  D:  03/22/2011  T:  03/22/2011  Job:  [id number]  Electronically Signed by Edythe Lynn M.D. on 03/24/2011 03:53:33 PM

## 2011-04-07 ENCOUNTER — Encounter: Payer: Self-pay | Admitting: Vascular Surgery

## 2011-04-24 ENCOUNTER — Encounter: Payer: Self-pay | Admitting: Vascular Surgery

## 2011-04-25 ENCOUNTER — Ambulatory Visit: Payer: Self-pay | Admitting: Vascular Surgery

## 2011-06-20 ENCOUNTER — Other Ambulatory Visit: Payer: Self-pay | Admitting: *Deleted

## 2011-07-02 ENCOUNTER — Encounter (HOSPITAL_COMMUNITY): Payer: Self-pay | Admitting: Pharmacy Technician

## 2011-07-03 ENCOUNTER — Encounter: Payer: Self-pay | Admitting: Vascular Surgery

## 2011-07-04 ENCOUNTER — Ambulatory Visit: Payer: Self-pay | Admitting: Vascular Surgery

## 2011-07-07 ENCOUNTER — Encounter (HOSPITAL_COMMUNITY)
Admission: RE | Disposition: A | Discharge status: Home / Self Care | Payer: Self-pay | Source: Ambulatory Visit | Attending: Vascular Surgery

## 2011-07-07 ENCOUNTER — Ambulatory Visit (HOSPITAL_COMMUNITY)
Admission: RE | Admit: 2011-07-07 | Discharge: 2011-07-07 | Disposition: A | Discharge status: Home / Self Care | Payer: Self-pay | Source: Ambulatory Visit | Attending: Vascular Surgery | Admitting: Vascular Surgery

## 2011-07-07 DIAGNOSIS — N039 Chronic nephritic syndrome with unspecified morphologic changes: Secondary | ICD-10-CM | POA: Insufficient documentation

## 2011-07-07 DIAGNOSIS — T82598A Other mechanical complication of other cardiac and vascular devices and implants, initial encounter: Secondary | ICD-10-CM | POA: Insufficient documentation

## 2011-07-07 DIAGNOSIS — T82898A Other specified complication of vascular prosthetic devices, implants and grafts, initial encounter: Secondary | ICD-10-CM

## 2011-07-07 DIAGNOSIS — Y832 Surgical operation with anastomosis, bypass or graft as the cause of abnormal reaction of the patient, or of later complication, without mention of misadventure at the time of the procedure: Secondary | ICD-10-CM | POA: Insufficient documentation

## 2011-07-07 DIAGNOSIS — N189 Chronic kidney disease, unspecified: Secondary | ICD-10-CM | POA: Insufficient documentation

## 2011-07-07 DIAGNOSIS — I129 Hypertensive chronic kidney disease with stage 1 through stage 4 chronic kidney disease, or unspecified chronic kidney disease: Secondary | ICD-10-CM | POA: Insufficient documentation

## 2011-07-07 DIAGNOSIS — M329 Systemic lupus erythematosus, unspecified: Secondary | ICD-10-CM | POA: Insufficient documentation

## 2011-07-07 HISTORY — PX: SHUNTOGRAM: SHX5491

## 2011-07-07 LAB — POCT I-STAT, CHEM 8
BUN: 43 mg/dL — ABNORMAL HIGH (ref 6–23)
Calcium, Ion: 1.11 mmol/L — ABNORMAL LOW (ref 1.12–1.32)
Chloride: 102 mEq/L (ref 96–112)
Glucose, Bld: 79 mg/dL (ref 70–99)
Potassium: 6 mEq/L — ABNORMAL HIGH (ref 3.5–5.1)

## 2011-07-07 SURGERY — ASSESSMENT, SHUNT FUNCTION, WITH CONTRAST RADIOGRAPHIC STUDY
Anesthesia: LOCAL | Laterality: Right

## 2011-07-07 MED ORDER — METOPROLOL TARTRATE 1 MG/ML IV SOLN: 2.0000 mg | INTRAVENOUS | Status: DC | PRN

## 2011-07-07 MED ORDER — SODIUM POLYSTYRENE SULFONATE 15 GM/60ML PO SUSP: 30.0000 g | Freq: Once | ORAL | Status: DC

## 2011-07-07 MED ORDER — ACETAMINOPHEN 325 MG RE SUPP
325.0000 mg | RECTAL | Status: DC | PRN
  Filled 2011-07-07: qty 2

## 2011-07-07 MED ORDER — ONDANSETRON HCL 4 MG/2ML IJ SOLN: 4.0000 mg | Freq: Four times a day (QID) | INTRAMUSCULAR | Status: DC | PRN

## 2011-07-07 MED ORDER — ACETAMINOPHEN 325 MG PO TABS
325.0000 mg | ORAL_TABLET | ORAL | Status: DC | PRN
  Filled 2011-07-07: qty 2

## 2011-07-07 MED ORDER — SODIUM CHLORIDE 0.9 % IJ SOLN: 3.0000 mL | INTRAMUSCULAR | Status: DC | PRN

## 2011-07-07 MED ORDER — HEPARIN (PORCINE) IN NACL 2-0.9 UNIT/ML-% IJ SOLN
INTRAMUSCULAR | Status: AC
  Filled 2011-07-07: qty 1000

## 2011-07-07 MED ORDER — HYDRALAZINE HCL 20 MG/ML IJ SOLN: 10.0000 mg | INTRAMUSCULAR | Status: DC | PRN

## 2011-07-07 MED ORDER — SODIUM POLYSTYRENE SULFONATE 15 GM/60ML PO SUSP
30.0000 g | ORAL | Status: DC
  Filled 2011-07-07: qty 120

## 2011-07-07 MED ORDER — LABETALOL HCL 5 MG/ML IV SOLN: 10.0000 mg | INTRAVENOUS | Status: DC | PRN

## 2011-07-07 MED ORDER — LIDOCAINE HCL (PF) 1 % IJ SOLN
INTRAMUSCULAR | Status: AC
  Filled 2011-07-07: qty 30

## 2011-07-07 NOTE — Progress Notes (Signed)
JENNIFER,RN NOTIFIED DR FIELDS OF K+ 6; NO NEW ORDERS

## 2011-07-07 NOTE — H&P (Signed)
  [facility] SHORT STAY H&P  CC:  Non maturing AVF   HPI: Small right AVF  Past Medical History  Diagnosis Date  . Gallstone pancreatitis Feb 2011  . Cervix carcinoma in situ Mar 2011  . Proteinuria - cause not known   . Hypertension   . Lupus   . Chronic kidney disease   . Lupus nephritis   . Renal insufficiency   . CHF (congestive heart failure)   . Renal failure, acute on chronic   . Anemia   . Thrombocytopenia     FH:  Non-Contributory  Social HX History  Substance Use Topics  . Smoking status: Never Smoker   . Smokeless tobacco: Not on file  . Alcohol Use: No    Allergies No Known Allergies  Medications Current Facility-Administered Medications  Medication Dose Route Frequency Provider Last Rate Last Dose  . heparin 2-0.9 UNIT/ML-% infusion           . lidocaine (XYLOCAINE) 1 % injection           . sodium chloride 0.9 % injection 3 mL  3 mL Intravenous PRN Hinda Lenis, MD         PHYSICAL EXAM  Filed Vitals:   07/07/11 0946  BP: 145/99  Pulse: 65  Temp: 97.4 F (36.3 C)  Resp: 20    General:  WDWN in NAD HENT: WNL Eyes: Pupils equal Pulmonary: normal non-labored breathing , without Rales, rhonchi,  wheezing Cardiac: RRR, Vascular Exam/Pulses:  +thrill right radial cephalic AVF, small Neuro A&O x 3; good sensation; motion in all extremities  Impression: Non maturing AVF  Plan: Fistulogram right arm   [place] Gipe E @TODAY @ 1:11 PM

## 2011-07-07 NOTE — Op Note (Signed)
Procedure: Right radial cephalic fistulogram  Preoperative diagnosis: Non maturing AV fistula right arm  Postoperative diagnosis: Same  Anesthesia: Local  Operative details: After obtaining informed consent, the patient was taken to the [place] lab. The patient was placed in supine position on the Angio table. Entire right upper extremity was prepped and draped in usual sterile fashion.   Local anesthesia was infiltrated over the proximal portion of the fistula in the right arm.   Ultrasound was used to identify the fistula.  A micropuncture needle was brought up on the operative field and this was used to directly cannulate the fistula. I initially tried to advance the guidewire into the fistula but this kept going out a side branch.  At this point she began to get swelling around the fistula and the needle was removed and hemostasis obtained with direct pressure. An additional puncture was then performed more proximally on the fistula.   A micropuncture wire was then threaded into the fistula and the micropuncture sheath threaded over this. Sheath was thoroughly flushed with heparinized saline and the dilator was removed. Contrast angiogram was then performed of the fistula.   The central venous structures are patent. There is a tapered narrowing of the distal cephalic vein.  The fistula is patent in its midportion.  There is normal antecubital anatomy with patent basilic and cephalic vein. Next, pressure was held on the upper arm in order to reflux contrast across the arterial anastomosis. The arterial anastomosis is patent with no narrowing.  There are abundant side branches in the proximal fistula.  A 3-0 Monocryl pursestring stitch was placed around the sheath and the sheath removed. Hemostasis was obtained.  The patient tolerated the procedure well and there were no complications. The patient was taken to the holding area in stable condition.  Operative findings: Multiple competing branches right  radial cephalic AVF          The patient will be scheduled for revision of her fistula in the near future.  Ruta Hinds, MD [facility] [place] Office: [phone] Pager: [phone]

## 2011-07-07 NOTE — Progress Notes (Signed)
Interpreter Lesle Chris for [facility] RN an Dr Scot Dock

## 2011-07-10 ENCOUNTER — Other Ambulatory Visit: Payer: Self-pay | Admitting: *Deleted

## 2011-07-15 ENCOUNTER — Other Ambulatory Visit (HOSPITAL_COMMUNITY): Payer: Self-pay | Admitting: *Deleted

## 2011-07-15 ENCOUNTER — Encounter (HOSPITAL_COMMUNITY): Payer: Self-pay | Admitting: *Deleted

## 2011-07-15 NOTE — Progress Notes (Signed)
Notified pt through an interpreter that surgery time has been moved up and that she needs to be here at 6:30.  Pt said "OK"

## 2011-07-15 NOTE — Progress Notes (Signed)
Pre op information obtained via an interpreter from [employer].  Pt said that Dr Oneida Alar instructed her to be here at 9:00am.  Pt instructed to take Labetalol and Clonidine with a sip of water in the am and to bring meds in, so we can update list.

## 2011-07-17 MED ORDER — SODIUM CHLORIDE 0.9 % IV SOLN
INTRAVENOUS | Status: DC
  Administered 2011-07-18: 07:00:00 via INTRAVENOUS

## 2011-07-17 MED ORDER — DEXTROSE 5 % IV SOLN
1.5000 g | INTRAVENOUS | Status: AC
  Administered 2011-07-18: 1.5 g via INTRAVENOUS
  Filled 2011-07-17: qty 1.5

## 2011-07-18 ENCOUNTER — Ambulatory Visit (HOSPITAL_COMMUNITY)
Admission: RE | Admit: 2011-07-18 | Discharge: 2011-07-18 | Disposition: A | Discharge status: Home / Self Care | Payer: Self-pay | Source: Ambulatory Visit | Attending: Vascular Surgery | Admitting: Vascular Surgery

## 2011-07-18 ENCOUNTER — Ambulatory Visit (HOSPITAL_COMMUNITY): Payer: Self-pay

## 2011-07-18 ENCOUNTER — Ambulatory Visit (HOSPITAL_COMMUNITY): Payer: Self-pay | Admitting: Certified Registered"

## 2011-07-18 ENCOUNTER — Encounter (HOSPITAL_COMMUNITY): Payer: Self-pay | Admitting: Certified Registered"

## 2011-07-18 ENCOUNTER — Encounter (HOSPITAL_COMMUNITY)
Admission: RE | Disposition: A | Discharge status: Home / Self Care | Payer: Self-pay | Source: Ambulatory Visit | Attending: Vascular Surgery

## 2011-07-18 DIAGNOSIS — N185 Chronic kidney disease, stage 5: Secondary | ICD-10-CM

## 2011-07-18 DIAGNOSIS — N186 End stage renal disease: Secondary | ICD-10-CM

## 2011-07-18 DIAGNOSIS — T82598A Other mechanical complication of other cardiac and vascular devices and implants, initial encounter: Secondary | ICD-10-CM | POA: Insufficient documentation

## 2011-07-18 DIAGNOSIS — Y849 Medical procedure, unspecified as the cause of abnormal reaction of the patient, or of later complication, without mention of misadventure at the time of the procedure: Secondary | ICD-10-CM | POA: Insufficient documentation

## 2011-07-18 DIAGNOSIS — N189 Chronic kidney disease, unspecified: Secondary | ICD-10-CM | POA: Insufficient documentation

## 2011-07-18 DIAGNOSIS — I129 Hypertensive chronic kidney disease with stage 1 through stage 4 chronic kidney disease, or unspecified chronic kidney disease: Secondary | ICD-10-CM | POA: Insufficient documentation

## 2011-07-18 HISTORY — PX: AV FISTULA PLACEMENT: SHX1204

## 2011-07-18 HISTORY — DX: Encounter for other specified aftercare: Z51.89

## 2011-07-18 HISTORY — DX: Reserved for inherently not codable concepts without codable children: IMO0001

## 2011-07-18 LAB — POCT I-STAT 4, (NA,K, GLUC, HGB,HCT)
Glucose, Bld: 81 mg/dL (ref 70–99)
HCT: 47 % — ABNORMAL HIGH (ref 36.0–46.0)
Hemoglobin: 16 g/dL — ABNORMAL HIGH (ref 12.0–15.0)
Potassium: 5.5 mEq/L — ABNORMAL HIGH (ref 3.5–5.1)
Sodium: 137 mEq/L (ref 135–145)

## 2011-07-18 LAB — SURGICAL PCR SCREEN: MRSA, PCR: NEGATIVE

## 2011-07-18 LAB — APTT: aPTT: 35 seconds (ref 24–37)

## 2011-07-18 LAB — PROTIME-INR: INR: 0.91 (ref 0.00–1.49)

## 2011-07-18 SURGERY — ARTERIOVENOUS (AV) FISTULA CREATION
Anesthesia: Monitor Anesthesia Care | Site: Arm Upper | Laterality: Right | Wound class: Clean

## 2011-07-18 MED ORDER — MIDAZOLAM HCL 5 MG/5ML IJ SOLN
INTRAMUSCULAR | Status: DC | PRN
  Administered 2011-07-18: 2 mg via INTRAVENOUS

## 2011-07-18 MED ORDER — PROPOFOL 10 MG/ML IV EMUL
INTRAVENOUS | Status: DC | PRN
  Administered 2011-07-18: 30 mg via INTRAVENOUS

## 2011-07-18 MED ORDER — HEPARIN SODIUM (PORCINE) 1000 UNIT/ML IJ SOLN
INTRAMUSCULAR | Status: DC | PRN
  Administered 2011-07-18: 6000 [IU] via INTRAVENOUS

## 2011-07-18 MED ORDER — LIDOCAINE HCL (CARDIAC) 20 MG/ML IV SOLN
INTRAVENOUS | Status: DC | PRN
  Administered 2011-07-18: 50 mg via INTRAVENOUS

## 2011-07-18 MED ORDER — SODIUM CHLORIDE 0.9 % IR SOLN
Status: DC | PRN
  Administered 2011-07-18: 09:00:00

## 2011-07-18 MED ORDER — HYDROXYZINE HCL 50 MG/ML IM SOLN
INTRAMUSCULAR | Status: AC
  Filled 2011-07-18: qty 1

## 2011-07-18 MED ORDER — ONDANSETRON HCL 4 MG/2ML IJ SOLN
4.0000 mg | Freq: Once | INTRAMUSCULAR | Status: AC | PRN
  Administered 2011-07-18: 4 mg via INTRAVENOUS

## 2011-07-18 MED ORDER — EPHEDRINE SULFATE 50 MG/ML IJ SOLN
INTRAMUSCULAR | Status: DC | PRN
  Administered 2011-07-18: 5 mg via INTRAVENOUS

## 2011-07-18 MED ORDER — MUPIROCIN 2 % EX OINT
TOPICAL_OINTMENT | CUTANEOUS | Status: AC
  Administered 2011-07-18: 1 via NASAL
  Filled 2011-07-18: qty 22

## 2011-07-18 MED ORDER — NITROGLYCERIN 5 MG/ML IV SOLN
INTRAVENOUS | Status: AC
  Administered 2011-07-18: 10:00:00
  Filled 2011-07-18: qty 2.5

## 2011-07-18 MED ORDER — 0.9 % SODIUM CHLORIDE (POUR BTL) OPTIME
TOPICAL | Status: DC | PRN
  Administered 2011-07-18: 1000 mL

## 2011-07-18 MED ORDER — PROTAMINE SULFATE 10 MG/ML IV SOLN
INTRAVENOUS | Status: DC | PRN
  Administered 2011-07-18: 30 mg via INTRAVENOUS

## 2011-07-18 MED ORDER — FENTANYL CITRATE 0.05 MG/ML IJ SOLN
INTRAMUSCULAR | Status: DC | PRN
  Administered 2011-07-18 (×3): 50 ug via INTRAVENOUS

## 2011-07-18 MED ORDER — MORPHINE SULFATE 10 MG/ML IJ SOLN
INTRAMUSCULAR | Status: DC | PRN
  Administered 2011-07-18: 2 mg via INTRAVENOUS

## 2011-07-18 MED ORDER — LIDOCAINE-EPINEPHRINE (PF) 1 %-1:200000 IJ SOLN
INTRAMUSCULAR | Status: DC | PRN
  Administered 2011-07-18: 13 mL

## 2011-07-18 MED ORDER — HYDROMORPHONE HCL PF 1 MG/ML IJ SOLN
0.2500 mg | INTRAMUSCULAR | Status: DC | PRN
Start: 2011-07-18 — End: 2011-07-18
  Administered 2011-07-18 (×2): 0.5 mg via INTRAVENOUS

## 2011-07-18 MED ORDER — PROPOFOL 10 MG/ML IV EMUL
INTRAVENOUS | Status: DC | PRN
  Administered 2011-07-18: 50 ug/kg/min via INTRAVENOUS

## 2011-07-18 MED ORDER — HYDROMORPHONE HCL PF 1 MG/ML IJ SOLN
INTRAMUSCULAR | Status: AC
  Filled 2011-07-18: qty 1

## 2011-07-18 MED ORDER — OXYCODONE-ACETAMINOPHEN 5-325 MG PO TABS: 1.0000 | ORAL_TABLET | ORAL | Status: DC | PRN

## 2011-07-18 MED ORDER — OXYCODONE-ACETAMINOPHEN 5-325 MG PO TABS: 1.0000 | ORAL_TABLET | ORAL | Status: AC | PRN

## 2011-07-18 SURGICAL SUPPLY — 52 items
ADH SKN CLS APL DERMABOND .7 (GAUZE/BANDAGES/DRESSINGS) ×2
ADH SKN CLS LQ APL DERMABOND (GAUZE/BANDAGES/DRESSINGS) ×2
BAG DECANTER FOR FLEXI CONT (MISCELLANEOUS) ×1 IMPLANT
CANISTER SUCTION 2500CC (MISCELLANEOUS) ×3 IMPLANT
CANNULA VESSEL W/WING WO/VALVE (CANNULA) ×1 IMPLANT
CLIP TI MEDIUM 6 (CLIP) ×1 IMPLANT
CLIP TI WIDE RED SMALL 24 (CLIP) ×1 IMPLANT
CLIP TI WIDE RED SMALL 6 (CLIP) ×1 IMPLANT
CLOTH BEACON ORANGE TIMEOUT ST (SAFETY) ×3 IMPLANT
COVER SURGICAL LIGHT HANDLE (MISCELLANEOUS) ×6 IMPLANT
DERMABOND ADHESIVE PROPEN (GAUZE/BANDAGES/DRESSINGS) ×1
DERMABOND ADVANCED (GAUZE/BANDAGES/DRESSINGS) ×1
DERMABOND ADVANCED .7 DNX12 (GAUZE/BANDAGES/DRESSINGS) ×2 IMPLANT
DERMABOND ADVANCED .7 DNX6 (GAUZE/BANDAGES/DRESSINGS) IMPLANT
ELECT REM PT RETURN 9FT ADLT (ELECTROSURGICAL) ×3
ELECTRODE REM PT RTRN 9FT ADLT (ELECTROSURGICAL) ×2 IMPLANT
GEL ULTRASOUND 20GR AQUASONIC (MISCELLANEOUS) ×1 IMPLANT
GLOVE BIO SURGEON STRL SZ7 (GLOVE) ×3 IMPLANT
GLOVE BIO SURGEON STRL SZ7.5 (GLOVE) ×1 IMPLANT
GLOVE BIOGEL PI IND STRL 6.5 (GLOVE) IMPLANT
GLOVE BIOGEL PI IND STRL 7.0 (GLOVE) IMPLANT
GLOVE BIOGEL PI IND STRL 7.5 (GLOVE) ×2 IMPLANT
GLOVE BIOGEL PI INDICATOR 6.5 (GLOVE) ×2
GLOVE BIOGEL PI INDICATOR 7.0 (GLOVE) ×1
GLOVE BIOGEL PI INDICATOR 7.5 (GLOVE) ×1
GLOVE ECLIPSE 6.5 STRL STRAW (GLOVE) ×1 IMPLANT
GLOVE SS BIOGEL STRL SZ 6.5 (GLOVE) IMPLANT
GLOVE SUPERSENSE BIOGEL SZ 6.5 (GLOVE) ×1
GOWN STRL NON-REIN LRG LVL3 (GOWN DISPOSABLE) ×5 IMPLANT
GOWN STRL REIN 2XL LVL4 (GOWN DISPOSABLE) ×1 IMPLANT
HEMOSTAT SURGICEL 2X14 (HEMOSTASIS) IMPLANT
KIT BASIN OR (CUSTOM PROCEDURE TRAY) ×3 IMPLANT
KIT ROOM TURNOVER OR (KITS) ×3 IMPLANT
NS IRRIG 1000ML POUR BTL (IV SOLUTION) ×3 IMPLANT
PACK CV ACCESS (CUSTOM PROCEDURE TRAY) ×3 IMPLANT
PAD ARMBOARD 7.5X6 YLW CONV (MISCELLANEOUS) ×6 IMPLANT
SPONGE GAUZE 4X4 12PLY (GAUZE/BANDAGES/DRESSINGS) ×2 IMPLANT
SPONGE SURGIFOAM ABS GEL 100 (HEMOSTASIS) IMPLANT
STOCKINETTE 4X48 STRL (DRAPES) ×1 IMPLANT
SUT ETHILON 3 0 PS 1 (SUTURE) IMPLANT
SUT MNCRL AB 4-0 PS2 18 (SUTURE) ×5 IMPLANT
SUT PROLENE 6 0 BV (SUTURE) ×2 IMPLANT
SUT SILK 0 TIES 10X30 (SUTURE) ×3 IMPLANT
SUT VIC AB 3-0 SH 27 (SUTURE) ×3
SUT VIC AB 3-0 SH 27X BRD (SUTURE) ×2 IMPLANT
SWAB COLLECTION DEVICE MRSA (MISCELLANEOUS) IMPLANT
SYR 20CC LL (SYRINGE) ×1 IMPLANT
TOWEL OR 17X24 6PK STRL BLUE (TOWEL DISPOSABLE) ×3 IMPLANT
TOWEL OR 17X26 10 PK STRL BLUE (TOWEL DISPOSABLE) ×3 IMPLANT
TUBE ANAEROBIC SPECIMEN COL (MISCELLANEOUS) IMPLANT
UNDERPAD 30X30 INCONTINENT (UNDERPADS AND DIAPERS) ×3 IMPLANT
WATER STERILE IRR 1000ML POUR (IV SOLUTION) ×3 IMPLANT

## 2011-07-18 NOTE — Anesthesia Postprocedure Evaluation (Signed)
  Anesthesia Post-op Note  Patient: Maria Vazquez  Procedure(s) Performed: Procedure(s) (LRB): ARTERIOVENOUS (AV) FISTULA CREATION (Right) LIGATION OF ARTERIOVENOUS  FISTULA (Right)  Patient Location: PACU  Anesthesia Type: MAC  Level of Consciousness: awake, alert  and oriented  Airway and Oxygen Therapy: Patient Spontanous Breathing and Patient connected to nasal cannula oxygen  Post-op Pain: mild  Post-op Assessment: Post-op Vital signs reviewed and Patient's Cardiovascular Status Stable  Post-op Vital Signs: stable  Complications: No apparent anesthesia complications

## 2011-07-18 NOTE — Preoperative (Signed)
Beta Blockers   Reason not to administer Beta Blockers:last dose 07/18/11 at 0700

## 2011-07-18 NOTE — H&P (View-Only) (Signed)
  VASCULAR [facility] VEIN SPECIALISTS SHORT STAY H&P  CC:  Non maturing AVF   HPI: Small right AVF  Past Medical History  Diagnosis Date  . Gallstone pancreatitis Feb 2011  . Cervix carcinoma in situ Mar 2011  . Proteinuria - cause not known   . Hypertension   . Lupus   . Chronic kidney disease   . Lupus nephritis   . Renal insufficiency   . CHF (congestive heart failure)   . Renal failure, acute on chronic   . Anemia   . Thrombocytopenia     FH:  Non-Contributory  Social HX History  Substance Use Topics  . Smoking status: Never Smoker   . Smokeless tobacco: Not on file  . Alcohol Use: No    Allergies No Known Allergies  Medications Current Facility-Administered Medications  Medication Dose Route Frequency Provider Last Rate Last Dose  . heparin 2-0.9 UNIT/ML-% infusion           . lidocaine (XYLOCAINE) 1 % injection           . sodium chloride 0.9 % injection 3 mL  3 mL Intravenous PRN Hinda Lenis, MD         PHYSICAL EXAM  Filed Vitals:   07/07/11 0946  BP: 145/99  Pulse: 65  Temp: 97.4 F (36.3 C)  Resp: 20    General:  WDWN in NAD HENT: WNL Eyes: Pupils equal Pulmonary: normal non-labored breathing , without Rales, rhonchi,  wheezing Cardiac: RRR, Vascular Exam/Pulses:  +thrill right radial cephalic AVF, small Neuro A&O x 3; good sensation; motion in all extremities  Impression: Non maturing AVF  Plan: Fistulogram right arm   Maria Vazquez E @TODAY @ 1:11 PM

## 2011-07-18 NOTE — Interval H&P Note (Signed)
History and Physical Interval Note:  07/18/2011 7:25 AM  Maria Vazquez  has presented today for surgery, with the diagnosis of ESRD  The various methods of treatment have been discussed with the patient and family. After consideration of risks, benefits and other options for treatment, the patient has consented to: [place] (Right) as a surgical intervention .  The patients' history has been reviewed, patient examined, no change in status, stable for surgery.  I have reviewed the patients' chart and labs.  Questions were answered to the patient's satisfaction.     [name] S

## 2011-07-18 NOTE — Anesthesia Preprocedure Evaluation (Addendum)
Anesthesia Evaluation  Patient identified by MRN, date of birth, ID band Patient awake    Reviewed: Allergy & Precautions, H&P , NPO status , Patient's Chart, lab work & pertinent test results  Airway Mallampati: I TM Distance: >3 FB Neck ROM: Full    Dental  (+) Teeth Intact and Dental Advisory Given   Pulmonary  clear to auscultation        Cardiovascular hypertension, - CHF Regular Normal    Neuro/Psych    GI/Hepatic   Endo/Other    Renal/GU      Musculoskeletal   Abdominal   Peds  Hematology   Anesthesia Other Findings   Reproductive/Obstetrics                          Anesthesia Physical Anesthesia Plan  ASA: III  Anesthesia Plan: MAC   Post-op Pain Management:    Induction:   Airway Management Planned:   Additional Equipment:   Intra-op Plan:   Post-operative Plan:   Informed Consent: I have reviewed the patients History and Physical, chart, labs and discussed the procedure including the risks, benefits and alternatives for the proposed anesthesia with the patient or authorized representative who has indicated his/her understanding and acceptance.   Dental advisory given  Plan Discussed with:   Anesthesia Plan Comments: (ESRD K-5.5 Lupus Htn  Plan MAC with periop steroid coverage.  Roberts Gaudy, MD)        Anesthesia Quick Evaluation

## 2011-07-18 NOTE — Progress Notes (Signed)
Pt. Understanding and able to speak [place], interpreter at bedside from language results.

## 2011-07-18 NOTE — Op Note (Addendum)
NAME: Maria Vazquez  MRN: [id number] DOB: 07/13/1975    DATE OF OPERATION: 07/18/2011  PREOP DIAGNOSIS: poorly maturing left radiocephalic AV fistula  POSTOP DIAGNOSIS: same  PROCEDURE:  1. Exploration of right radiocephalic AV fistula 2. ligation of right radiocephalic AV fistula 3. Placement of a new right brachiocephalic AV fistula  SURGEON: Judeth Cornfield. Scot Dock, MD, FACS  ASSIST: None  ANESTHESIA: local with sedation   EBL: minimal  INDICATIONS: Maria Vazquez is a 36 y.o. female had a left radiocephalic AV fistula placed. She was seen in the office by Dr. Oneida Alar it was noted that this was not maturing adequately. Dr. Oneida Alar performed a fistulogram and she was set up for ligation of multiple competing branches. I reviewed the fistulogram and it appeared that the cephalic vein essentially became bifid with perfusion being maintained by one of the branches. The upper arm cephalic vein looked to be in size. I interrogated with the duplex and it did appear that the fistula which was quite large at the wrist and then became very small where it was being maintained by a small branch.  FINDINGS: The cephalic vein was being maintained by a small branch and I did not think that the fistula could be salvaged. I therefore elected to place a upper arm brachiocephalic fistula.  TECHNIQUE: The patient was taken to the operating room and I interrogated the radial cephalic fistula with the duplex scanner. The right upper extremity was prepped and draped in the usual sterile fashion. The area where the vein became very small was identified by duplex scan. After the skin was anesthetized, a transverse incision was made at this level and the fistula was dissected free. The proximal fistula was good size however this was then being maintained by a bifid system with a small branch containing close to the fistula. Did not seem way to salvage this fistula. I therefore elected to place an  upper arm fistula.  A transverse incision was made at the antecubital after the skin was anesthetized. Here the artery was dissected free and was very small. It also spasmed quite easily. It was clear that the patient had a high bifurcation of the brachial artery. I dissected the ulnar artery out also and this appeared to be reasonable in size however it also quickly spasmed. The arteries were about the same size. We obtained Wolf solution with nitroglycerin in order to try to relieve the vasospasm and this was helpful. Patient was then heparinized. The cephalic vein in the upper arm was dissected free and ligated distally. It irrigated up nicely with heparinized saline. The radial artery was clamped proximally and distally and longitudinal arteriotomy was made. The vein was sewn end to side to the artery using continuous 6-0 Prolene suture. Completion was a good thrill in the fistula and a palpable radial pulse. Hemostasis was obtained in the wounds. Both wounds were closed with deep layer 3-0 Vicryl and the skin closed with 4-0 Vicryl. A small 1 centimeter incision made over the proximal fistula the fistula was dissected free and ligated. His incision was closed with 4-0 silk ligature stitch. Dermabond was applied to the incisions. The patient tolerated the procedure well and was transferred to the recovery room in stable condition. All needle and sponge counts were correct.  Maria Mayo, MD, FACS [facility]  DATE OF DICTATION:   07/18/2011

## 2011-07-18 NOTE — Anesthesia Postprocedure Evaluation (Signed)
  Anesthesia Post-op Note  Patient: Maria Vazquez  Procedure(s) Performed: Procedure(s) (LRB): ARTERIOVENOUS (AV) FISTULA CREATION (Right) LIGATION OF ARTERIOVENOUS  FISTULA (Right)  Patient Location: PACU  Anesthesia Type: MAC  Level of Consciousness: awake, alert  and oriented  Airway and Oxygen Therapy: Patient Spontanous Breathing  Post-op Pain: none  Post-op Assessment: Post-op Vital signs reviewed and Patient's Cardiovascular Status Stable  Post-op Vital Signs: stable  Complications: No apparent anesthesia complications

## 2011-07-18 NOTE — Transfer of Care (Signed)
Immediate Anesthesia Transfer of Care Note  Patient: Maria Vazquez  Procedure(s) Performed: Procedure(s) (LRB): ARTERIOVENOUS (AV) FISTULA CREATION (Right) LIGATION OF ARTERIOVENOUS  FISTULA (Right)  Patient Location: PACU  Anesthesia Type: MAC  Level of Consciousness: awake, alert  and oriented  Airway & Oxygen Therapy: Patient Spontanous Breathing  Post-op Assessment: Report given to PACU RN  Post vital signs: Reviewed and stable  Complications: No apparent anesthesia complications

## 2011-07-18 NOTE — Progress Notes (Signed)
Report of OR , faxed to [facility] HD center, paged Juanda Crumble PA  To tell procedure.

## 2011-07-23 ENCOUNTER — Encounter (HOSPITAL_COMMUNITY): Payer: Self-pay | Admitting: Vascular Surgery

## 2011-10-07 ENCOUNTER — Encounter: Payer: Self-pay | Admitting: Vascular Surgery

## 2011-10-08 ENCOUNTER — Encounter (HOSPITAL_COMMUNITY): Payer: Self-pay | Admitting: Pharmacy Technician

## 2011-10-08 ENCOUNTER — Encounter: Payer: Self-pay | Admitting: Vascular Surgery

## 2011-10-08 ENCOUNTER — Ambulatory Visit (INDEPENDENT_AMBULATORY_CARE_PROVIDER_SITE_OTHER): Payer: Self-pay | Admitting: Vascular Surgery

## 2011-10-08 ENCOUNTER — Other Ambulatory Visit: Payer: Self-pay

## 2011-10-08 ENCOUNTER — Encounter (INDEPENDENT_AMBULATORY_CARE_PROVIDER_SITE_OTHER): Payer: Self-pay | Admitting: *Deleted

## 2011-10-08 VITALS — BP 107/76 | HR 78 | Temp 98.2°F | Ht 60.0 in | Wt 168.0 lb

## 2011-10-08 DIAGNOSIS — T82898A Other specified complication of vascular prosthetic devices, implants and grafts, initial encounter: Secondary | ICD-10-CM

## 2011-10-08 DIAGNOSIS — N186 End stage renal disease: Secondary | ICD-10-CM | POA: Insufficient documentation

## 2011-10-08 NOTE — Progress Notes (Signed)
[facility]  Patient name: Maria Vazquez MRN: [id number] DOB: [birth year] Sex: female  REASON FOR VISIT: to check on maturation of her right upper arm AV fistula. Referred by Dr. Erling Cruz.  HPI: Maria Vazquez is a 36 y.o. female who had a right brachiocephalic AV fistula placed on 07/18/2011. This was after a Cimino fistula failed to mature. The patient states, through her translator, the Dr. Florene Glen felt that the fistula was not adequate for access. She's had no pain associated with the fistula. She currently being dialyzed with a catheter. He dialyzes on Tuesdays Thursdays and Saturdays. She's had no recent uremic symptoms.   REVIEW OF SYSTEMS: Valu.Nieves ] denotes positive finding; [  ] denotes negative finding  CARDIOVASCULAR:  [ ]  chest pain   [ ]  dyspnea on exertion    CONSTITUTIONAL:  [ ]  fever   [ ]  chills  PHYSICAL EXAM: Filed Vitals:   10/08/11 1052  BP: 107/76  Pulse: 78  Temp: 98.2 F (36.8 C)  TempSrc: Oral  Height: 5' (1.524 m)  Weight: 168 lb (76.204 kg)   Body mass index is 32.81 kg/(m^2). GENERAL: The patient is a well-nourished female, in no acute distress. The vital signs are documented above. CARDIOVASCULAR: There is a regular rate and rhythm  PULMONARY: There is good air exchange bilaterally without wheezing or rales. Her fistula in the right upper arm has an excellent bruit and thrill. It appears to be fairly large.  By exam I thought the fistula should be usable for access. However, given the concerns I did obtain a duplex of her fistula in the office today. This shows thrombus within the proximal fistula which is causing a significant area of increased velocities in the proximal fistula. In addition there is one competing branches noted. Of note the diameters of the vein are excellent ranging from 0.79 cm to 0.94 cm. The depth is also reasonable ranging from 0.21 cm to 0.67 cm.  MEDICAL ISSUES: Given the thrombus  in the proximal fistula with area of increased callosity and one competing branches I've recommended that we revise the fistula in this area with simple thrombectomy and ligation of competing branch. When this is done I think the fistula should be ready to access some time in June as it has matured nicely. Her surgery is scheduled for 10/10/2011. All of her questions were answered.  [place] [facility] [place]: [phone]

## 2011-10-09 MED ORDER — CEFAZOLIN SODIUM 1-5 GM-% IV SOLN
1.0000 g | Freq: Once | INTRAVENOUS | Status: AC
  Administered 2011-10-10: 1 g via INTRAVENOUS
  Filled 2011-10-09: qty 50

## 2011-10-09 MED ORDER — SODIUM CHLORIDE 0.9 % IV SOLN
INTRAVENOUS | Status: DC
  Administered 2011-10-10: 35 mL/h via INTRAVENOUS

## 2011-10-10 ENCOUNTER — Encounter (HOSPITAL_COMMUNITY): Payer: Self-pay | Admitting: Anesthesiology

## 2011-10-10 ENCOUNTER — Encounter (HOSPITAL_COMMUNITY): Payer: Self-pay | Admitting: *Deleted

## 2011-10-10 ENCOUNTER — Ambulatory Visit (HOSPITAL_COMMUNITY)
Admission: RE | Admit: 2011-10-10 | Discharge: 2011-10-10 | Disposition: A | Discharge status: Home / Self Care | Payer: Self-pay | Source: Ambulatory Visit | Attending: Vascular Surgery | Admitting: Vascular Surgery

## 2011-10-10 ENCOUNTER — Ambulatory Visit (HOSPITAL_COMMUNITY): Payer: Self-pay | Admitting: Anesthesiology

## 2011-10-10 ENCOUNTER — Encounter (HOSPITAL_COMMUNITY)
Admission: RE | Disposition: A | Discharge status: Home / Self Care | Payer: Self-pay | Source: Ambulatory Visit | Attending: Vascular Surgery

## 2011-10-10 DIAGNOSIS — T82898A Other specified complication of vascular prosthetic devices, implants and grafts, initial encounter: Secondary | ICD-10-CM

## 2011-10-10 DIAGNOSIS — I120 Hypertensive chronic kidney disease with stage 5 chronic kidney disease or end stage renal disease: Secondary | ICD-10-CM | POA: Insufficient documentation

## 2011-10-10 DIAGNOSIS — Y849 Medical procedure, unspecified as the cause of abnormal reaction of the patient, or of later complication, without mention of misadventure at the time of the procedure: Secondary | ICD-10-CM | POA: Insufficient documentation

## 2011-10-10 DIAGNOSIS — N186 End stage renal disease: Secondary | ICD-10-CM | POA: Insufficient documentation

## 2011-10-10 DIAGNOSIS — Z992 Dependence on renal dialysis: Secondary | ICD-10-CM | POA: Insufficient documentation

## 2011-10-10 HISTORY — PX: EMBOLECTOMY: SHX44

## 2011-10-10 LAB — POCT I-STAT 4, (NA,K, GLUC, HGB,HCT)
Hemoglobin: 12.2 g/dL (ref 12.0–15.0)
Sodium: 138 mEq/L (ref 135–145)

## 2011-10-10 LAB — SURGICAL PCR SCREEN
MRSA, PCR: NEGATIVE
Staphylococcus aureus: POSITIVE — AB

## 2011-10-10 SURGERY — EMBOLECTOMY
Anesthesia: Monitor Anesthesia Care | Site: Arm Upper | Laterality: Right | Wound class: Clean

## 2011-10-10 MED ORDER — ONDANSETRON HCL 4 MG/2ML IJ SOLN: 4.0000 mg | Freq: Once | INTRAMUSCULAR | Status: DC | PRN

## 2011-10-10 MED ORDER — LIDOCAINE HCL (PF) 1 % IJ SOLN
INTRAMUSCULAR | Status: DC | PRN
  Administered 2011-10-10: 8 mL

## 2011-10-10 MED ORDER — FENTANYL CITRATE 0.05 MG/ML IJ SOLN
INTRAMUSCULAR | Status: DC | PRN
  Administered 2011-10-10 (×2): 50 ug via INTRAVENOUS
  Administered 2011-10-10: 100 ug via INTRAVENOUS

## 2011-10-10 MED ORDER — OXYCODONE-ACETAMINOPHEN 5-325 MG PO TABS: 1.0000 | ORAL_TABLET | ORAL | Status: AC | PRN

## 2011-10-10 MED ORDER — PROPOFOL 10 MG/ML IV EMUL
INTRAVENOUS | Status: DC | PRN
  Administered 2011-10-10: 25 mg via INTRAVENOUS
  Administered 2011-10-10: 10 mg via INTRAVENOUS

## 2011-10-10 MED ORDER — MUPIROCIN 2 % EX OINT
TOPICAL_OINTMENT | CUTANEOUS | Status: AC
  Administered 2011-10-10: 1 via NASAL
  Filled 2011-10-10: qty 22

## 2011-10-10 MED ORDER — PROPOFOL 10 MG/ML IV EMUL
INTRAVENOUS | Status: DC | PRN
  Administered 2011-10-10: 50 ug/kg/min via INTRAVENOUS

## 2011-10-10 MED ORDER — MUPIROCIN 2 % EX OINT
TOPICAL_OINTMENT | Freq: Two times a day (BID) | CUTANEOUS | Status: DC
  Administered 2011-10-10: 1 via NASAL

## 2011-10-10 MED ORDER — HEPARIN SODIUM (PORCINE) 1000 UNIT/ML IJ SOLN
INTRAMUSCULAR | Status: DC | PRN
  Administered 2011-10-10: 6000 [IU] via INTRAVENOUS

## 2011-10-10 MED ORDER — LIDOCAINE HCL (CARDIAC) 20 MG/ML IV SOLN
INTRAVENOUS | Status: DC | PRN
  Administered 2011-10-10: 50 mg via INTRAVENOUS

## 2011-10-10 MED ORDER — SODIUM CHLORIDE 0.9 % IV SOLN
INTRAVENOUS | Status: DC | PRN
  Administered 2011-10-10: 10:00:00 via INTRAVENOUS

## 2011-10-10 MED ORDER — PROTAMINE SULFATE 10 MG/ML IV SOLN
INTRAVENOUS | Status: DC | PRN
  Administered 2011-10-10: 30 mg via INTRAVENOUS

## 2011-10-10 MED ORDER — HYDROMORPHONE HCL PF 1 MG/ML IJ SOLN
0.2500 mg | INTRAMUSCULAR | Status: DC | PRN
  Administered 2011-10-10 (×2): 0.5 mg via INTRAVENOUS

## 2011-10-10 MED ORDER — MIDAZOLAM HCL 5 MG/5ML IJ SOLN
INTRAMUSCULAR | Status: DC | PRN
  Administered 2011-10-10: 2 mg via INTRAVENOUS

## 2011-10-10 MED ORDER — SODIUM CHLORIDE 0.9 % IR SOLN
Status: DC | PRN
  Administered 2011-10-10: 1000 mL

## 2011-10-10 MED ORDER — SODIUM CHLORIDE 0.9 % IR SOLN
Status: DC | PRN
  Administered 2011-10-10: 11:00:00

## 2011-10-10 SURGICAL SUPPLY — 41 items
ADH SKN CLS APL DERMABOND .7 (GAUZE/BANDAGES/DRESSINGS) ×1
CANISTER SUCTION 2500CC (MISCELLANEOUS) ×2 IMPLANT
CATH EMB 5FR 80CM (CATHETERS) ×1 IMPLANT
CLIP TI MEDIUM 6 (CLIP) ×2 IMPLANT
CLIP TI WIDE RED SMALL 6 (CLIP) ×3 IMPLANT
CLOTH BEACON ORANGE TIMEOUT ST (SAFETY) ×2 IMPLANT
COVER PROBE W GEL 5X96 (DRAPES) ×3 IMPLANT
COVER SURGICAL LIGHT HANDLE (MISCELLANEOUS) ×4 IMPLANT
DECANTER SPIKE VIAL GLASS SM (MISCELLANEOUS) ×2 IMPLANT
DERMABOND ADVANCED (GAUZE/BANDAGES/DRESSINGS) ×1
DERMABOND ADVANCED .7 DNX12 (GAUZE/BANDAGES/DRESSINGS) ×1 IMPLANT
DRAIN PENROSE 1/2X12 LTX STRL (WOUND CARE) IMPLANT
ELECT REM PT RETURN 9FT ADLT (ELECTROSURGICAL) ×2
ELECTRODE REM PT RTRN 9FT ADLT (ELECTROSURGICAL) ×1 IMPLANT
GLOVE BIO SURGEON STRL SZ7.5 (GLOVE) ×2 IMPLANT
GLOVE BIOGEL PI IND STRL 6.5 (GLOVE) IMPLANT
GLOVE BIOGEL PI IND STRL 7.0 (GLOVE) IMPLANT
GLOVE BIOGEL PI IND STRL 7.5 (GLOVE) ×1 IMPLANT
GLOVE BIOGEL PI INDICATOR 6.5 (GLOVE) ×1
GLOVE BIOGEL PI INDICATOR 7.0 (GLOVE) ×3
GLOVE BIOGEL PI INDICATOR 7.5 (GLOVE) ×1
GLOVE ECLIPSE 6.5 STRL STRAW (GLOVE) ×1 IMPLANT
GLOVE SURG SS PI 7.0 STRL IVOR (GLOVE) ×1 IMPLANT
GLOVE SURG SS PI 7.5 STRL IVOR (GLOVE) ×1 IMPLANT
GOWN STRL NON-REIN LRG LVL3 (GOWN DISPOSABLE) ×2 IMPLANT
KIT BASIN OR (CUSTOM PROCEDURE TRAY) ×2 IMPLANT
KIT ROOM TURNOVER OR (KITS) ×2 IMPLANT
NS IRRIG 1000ML POUR BTL (IV SOLUTION) ×2 IMPLANT
PACK CV ACCESS (CUSTOM PROCEDURE TRAY) ×2 IMPLANT
PAD ARMBOARD 7.5X6 YLW CONV (MISCELLANEOUS) ×4 IMPLANT
SLEEVE SURGEON STRL (DRAPES) ×1 IMPLANT
SPONGE GAUZE 4X4 12PLY (GAUZE/BANDAGES/DRESSINGS) ×2 IMPLANT
SPONGE SURGIFOAM ABS GEL 100 (HEMOSTASIS) IMPLANT
SUT PROLENE 6 0 BV (SUTURE) ×4 IMPLANT
SUT VIC AB 3-0 SH 27 (SUTURE) ×2
SUT VIC AB 3-0 SH 27X BRD (SUTURE) ×1 IMPLANT
SUT VICRYL 4-0 PS2 18IN ABS (SUTURE) ×2 IMPLANT
TOWEL OR 17X24 6PK STRL BLUE (TOWEL DISPOSABLE) ×2 IMPLANT
TOWEL OR 17X26 10 PK STRL BLUE (TOWEL DISPOSABLE) ×2 IMPLANT
UNDERPAD 30X30 INCONTINENT (UNDERPADS AND DIAPERS) ×2 IMPLANT
WATER STERILE IRR 1000ML POUR (IV SOLUTION) ×2 IMPLANT

## 2011-10-10 NOTE — Interval H&P Note (Signed)
History and Physical Interval Note:  10/10/2011 10:21 AM  Maria Vazquez  has presented today for surgery, with the diagnosis of End Stage Renal Disease  The various methods of treatment have been discussed with the patient and family. After consideration of risks, benefits and other options for treatment, the patient has consented to: [name] (Right) as a surgical intervention .  The patients' history has been reviewed, patient examined, no change in status, stable for surgery.  I have reviewed the patients' chart and labs.  Questions were answered to the patient's satisfaction.     Corona Popovich S

## 2011-10-10 NOTE — Anesthesia Preprocedure Evaluation (Addendum)
Anesthesia Evaluation  Patient identified by MRN, date of birth, ID band Patient awake    Reviewed: Allergy & Precautions, H&P , NPO status , Patient's Chart, lab work & pertinent test results, reviewed documented beta blocker date and time   Airway Mallampati: I TM Distance: >3 FB Neck ROM: Full    Dental  (+) Teeth Intact and Dental Advisory Given   Pulmonary shortness of breath, pneumonia ,  breath sounds clear to auscultation        Cardiovascular hypertension, Pt. on medications and Pt. on home beta blockers +CHF Rhythm:Regular Rate:Normal     Neuro/Psych    GI/Hepatic   Endo/Other    Renal/GU ESRF and DialysisRenal disease     Musculoskeletal   Abdominal   Peds  Hematology   Anesthesia Other Findings   Reproductive/Obstetrics                          Anesthesia Physical Anesthesia Plan  ASA: III  Anesthesia Plan: MAC   Post-op Pain Management:    Induction: Intravenous  Airway Management Planned: Nasal Cannula  Additional Equipment:   Intra-op Plan:   Post-operative Plan:   Informed Consent: I have reviewed the patients History and Physical, chart, labs and discussed the procedure including the risks, benefits and alternatives for the proposed anesthesia with the patient or authorized representative who has indicated his/her understanding and acceptance.   Dental advisory given  Plan Discussed with: CRNA, Anesthesiologist and Surgeon  Anesthesia Plan Comments:        Anesthesia Quick Evaluation

## 2011-10-10 NOTE — Op Note (Signed)
NAME: Maria Vazquez  MRN: [id number] DOB: 1976/02/02    DATE OF OPERATION: 10/10/2011  PREOP DIAGNOSIS: chronic kidney disease  POSTOP DIAGNOSIS: same  PROCEDURE:  1. Thrombectomy of right upper arm AV fistula 2. Ligation of competing branch  SURGEON: Judeth Cornfield. Scot Dock, MD, FACS  ASSIST: Luz Lex, PA  ANESTHESIA: local with sedation   EBL: minimal  INDICATIONS: [name] is a 36 y.o. female who had a left upper arm AV fistula which was placed on 07/18/2011. The were some concerns about whether the fistula could be cannulated. Duplex was obtained which showed a small segment of clot within the fistula with an area of increased velocity. It was also competing branches noted at this level.   FINDINGS: no significant clot was identified. There were no problems and pulling a 5 Fogarty through the fistula both proximally and distally. The large competing branch was ligated.  TECHNIQUE: The patient was taken to the uterine and was sedated by anesthesia. The right upper extremity was prepped and draped in the usual sterile fashion. After the skin was anesthetized with 1% lidocaine, a longitudinal incision was made over the area of concern. The fistula was dissected free proximally and distally. A large vein branch was identified and ligated. The patient was then heparinized. Once the heparin had circulated, a transverse venotomy was made in the fistula. There was no clot noted at this level. The 5:30 catheter was then lost multiple times distally with no clot retrieved. The catheter was passed proximally with no clot retrieved. The catheter easily passed through the fistula in both directions. I suspected that most likely the patient had a small clot which had embolized distally and it was no longer present. The fistula was closed with a running 6-0 Prolene suture. There was an excellent thrill in the fistula. The heparin was partially reversed with protamine. The  wound was closed with a deep layer of 3-0 Vicryl and the skin closed with 4-0 Vicryl. Dermabond was applied. The patient tolerated the procedure well and was transferred to the recovery room in stable condition. All needle and sponge counts were correct.  [name], MD, FACS [facility]  DATE OF DICTATION:   10/10/2011

## 2011-10-10 NOTE — Transfer of Care (Signed)
Immediate Anesthesia Transfer of Care Note  Patient: Maria Vazquez  Procedure(s) Performed: Procedure(s) (LRB): EMBOLECTOMY (Right)  Patient Location: PACU  Anesthesia Type: MAC  Level of Consciousness: awake  Airway & Oxygen Therapy: Patient Spontanous Breathing and Patient connected to nasal cannula oxygen  Post-op Assessment: Report given to PACU RN and Post -op Vital signs reviewed and stable  Post vital signs: Reviewed and stable  Complications: No apparent anesthesia complications

## 2011-10-10 NOTE — Preoperative (Signed)
Beta Blockers   Reason not to administer Beta Blockers:Not Applicable. Labetalol 100 mg @ 0600 10/10/11

## 2011-10-10 NOTE — H&P (View-Only) (Signed)
[facility] and Vein Specialist of [facility]  Patient name: Maria Vazquez MRN: [id number] DOB: [birth year] Sex: female  REASON FOR VISIT: to check on maturation of her right upper arm AV fistula. Referred by Dr. Erling Cruz.  HPI: Maria Vazquez is a 36 y.o. female who had a right brachiocephalic AV fistula placed on 07/18/2011. This was after a Cimino fistula failed to mature. The patient states, through her translator, the Dr. Florene Glen felt that the fistula was not adequate for access. She's had no pain associated with the fistula. She currently being dialyzed with a catheter. He dialyzes on Tuesdays Thursdays and Saturdays. She's had no recent uremic symptoms.   REVIEW OF SYSTEMS: Valu.Nieves ] denotes positive finding; [  ] denotes negative finding  CARDIOVASCULAR:  [ ]  chest pain   [ ]  dyspnea on exertion    CONSTITUTIONAL:  [ ]  fever   [ ]  chills  PHYSICAL EXAM: Filed Vitals:   10/08/11 1052  BP: 107/76  Pulse: 78  Temp: 98.2 F (36.8 C)  TempSrc: Oral  Height: 5' (1.524 m)  Weight: 168 lb (76.204 kg)   Body mass index is 32.81 kg/(m^2). GENERAL: The patient is a well-nourished female, in no acute distress. The vital signs are documented above. CARDIOVASCULAR: There is a regular rate and rhythm  PULMONARY: There is good air exchange bilaterally without wheezing or rales. Her fistula in the right upper arm has an excellent bruit and thrill. It appears to be fairly large.  By exam I thought the fistula should be usable for access. However, given the concerns I did obtain a duplex of her fistula in the office today. This shows thrombus within the proximal fistula which is causing a significant area of increased velocities in the proximal fistula. In addition there is one competing branches noted. Of note the diameters of the vein are excellent ranging from 0.79 cm to 0.94 cm. The depth is also reasonable ranging from 0.21 cm to 0.67 cm.  MEDICAL ISSUES: Given the thrombus  in the proximal fistula with area of increased callosity and one competing branches I've recommended that we revise the fistula in this area with simple thrombectomy and ligation of competing branch. When this is done I think the fistula should be ready to access some time in June as it has matured nicely. Her surgery is scheduled for 10/10/2011. All of her questions were answered.  [place] [facility] [place] Beeper: [phone]

## 2011-10-10 NOTE — Anesthesia Postprocedure Evaluation (Addendum)
  Anesthesia Post-op Note  Patient: Maria Vazquez  Procedure(s) Performed: Procedure(s) (LRB): EMBOLECTOMY (Right)  Patient Location: PACU  Anesthesia Type: MAC  Level of Consciousness: awake, alert  and oriented  Airway and Oxygen Therapy: Patient Spontanous Breathing  Post-op Pain: mild  Post-op Assessment: Post-op Vital signs reviewed  Post-op Vital Signs: Reviewed  Complications: No apparent anesthesia complications

## 2011-10-14 ENCOUNTER — Encounter (HOSPITAL_COMMUNITY): Payer: Self-pay | Admitting: Vascular Surgery

## 2011-10-15 NOTE — Procedures (Unsigned)
VASCULAR LAB EXAM  INDICATION:  Follow up right AV fistula.  HISTORY: Placement of a new brachiocephalic AV fistula [id number] by Dr. Scot Dock. Diabetes:  No. Cardiac:  No. Hypertension:  No.  EXAM:  Duplex imaging reveals soft thrombus in the distal cephalic outflow vein with velocities >844 cm/s.  This thrombus was located 1-2 cm from the AV fistula.  IMPRESSION:  Soft thrombus in the outflow vein in the distal cephalic vein.  ___________________________________________ Judeth Cornfield. Scot Dock, M.D.  SS/MEDQ  D:  10/08/2011  T:  10/08/2011  Job:  [id number]

## 2011-10-21 ENCOUNTER — Encounter: Payer: Self-pay | Admitting: Vascular Surgery

## 2011-10-22 ENCOUNTER — Encounter (INDEPENDENT_AMBULATORY_CARE_PROVIDER_SITE_OTHER): Payer: Self-pay | Admitting: *Deleted

## 2011-10-22 ENCOUNTER — Ambulatory Visit (INDEPENDENT_AMBULATORY_CARE_PROVIDER_SITE_OTHER): Payer: Self-pay | Admitting: Vascular Surgery

## 2011-10-22 ENCOUNTER — Encounter: Payer: Self-pay | Admitting: Vascular Surgery

## 2011-10-22 VITALS — BP 105/66 | HR 57 | Temp 98.4°F | Ht 60.0 in | Wt 171.0 lb

## 2011-10-22 DIAGNOSIS — T82898A Other specified complication of vascular prosthetic devices, implants and grafts, initial encounter: Secondary | ICD-10-CM

## 2011-10-22 DIAGNOSIS — N186 End stage renal disease: Secondary | ICD-10-CM

## 2011-10-22 NOTE — Progress Notes (Signed)
Vascular and Vein Specialist of [facility]  Patient name: Maria Vazquez MRN: [id number] DOB: [birth year] Sex: female  REASON FOR VISIT: check on maturation of the fistula. Referred by Dr. Erling Cruz.  HPI: Maria Vazquez is a 36 y.o. female who had a right brachiocephalic AV fistula placed on 07/18/2011. She she had a duplex of her fistula on 10/08/2011 which showed what appeared to be thrombus in the proximal fistula causing a significant area of increased velocities. It was also 1 competing branches noted. Based on these duplex findings, she was taken to the operating room on 10/10/2011 and underwent thrombectomy of her right upper arm fistula with ligation of a large competing branches. A Fogarty catheter was passed in both directions multiple times and no clot was retrieved. There was no problem at the area where the vein was opened and I suspected that she had a clot which had embolized distally and was no longer present. My feeling was that they could use the fistula in 3 weeks. Dr. Florene Glen did not feel that the fistula would be usable in 3 weeks anastomotic we reevaluate her fistula. She has had no pain associated with the fistula.   REVIEW OF SYSTEMS: Valu.Nieves ] denotes positive finding; [  ] denotes negative finding  CARDIOVASCULAR:  [ ]  chest pain   [ ]  dyspnea on exertion    CONSTITUTIONAL:  [ ]  fever   [ ]  chills  PHYSICAL EXAM: Filed Vitals:   10/22/11 1318  BP: 105/66  Pulse: 57  Temp: 98.4 F (36.9 C)  TempSrc: Oral  Height: 5' (1.524 m)  Weight: 171 lb (77.565 kg)   Body mass index is 33.40 kg/(m^2). GENERAL: The patient is a well-nourished female, in no acute distress. The vital signs are documented above. CARDIOVASCULAR: There is a regular rate and rhythm  PULMONARY: There is good air exchange bilaterally without wheezing or rales. The fistula has an excellent bruit and thrill. The incision is healed nicely.  DUPLEX: shows that the fistula is widely  patent with diameters ranging from 0. 81 cm to 1.2 cm. Thus, the fistula appears to be adequate size. Depths ranged from 0.34 cm to 0.84 cm which appears to be a reasonable depth no other problems are identified with the fistula.  MEDICAL ISSUES: I think that it would be reasonable to use the fistula in 2 weeks. If there are any problems with it then certainly we can proceed with a fistulogram although at this point I'm not sure what it would add.   [place] [facility] [place] Beeper: [phone]

## 2011-10-29 NOTE — Procedures (Unsigned)
VASCULAR LAB EXAM  INDICATION:  Poorly functioning right upper arm fistula.  HISTORY: Chronic kidney disease. Diabetes: Cardiac: Hypertension:  EXAM:  The right upper extremity fistula was evaluated.  The brachial artery appears to have a high bifurcation, so the cephalic vein is anastomosed with the radial branch.  The fistula is patent with an area of irregularity at the proximal segment of the vein due to recent intervention. Depth and diameters are detailed on separate diagram.  IMPRESSION:  Patent right radiocephalic fistula with depth and diameters on attached diagram.  ___________________________________________ Judeth Cornfield. Scot Dock, M.D.  LT/MEDQ  D:  10/23/2011  T:  10/23/2011  Job:  [id number]

## 2011-12-03 ENCOUNTER — Other Ambulatory Visit: Payer: Self-pay

## 2011-12-04 ENCOUNTER — Encounter (HOSPITAL_COMMUNITY): Payer: Self-pay | Admitting: Pharmacist

## 2011-12-07 MED ORDER — SODIUM CHLORIDE 0.9 % IJ SOLN: 3.0000 mL | INTRAMUSCULAR | Status: DC | PRN

## 2011-12-08 ENCOUNTER — Encounter (HOSPITAL_COMMUNITY)
Admission: RE | Disposition: A | Discharge status: Home / Self Care | Payer: Self-pay | Source: Ambulatory Visit | Attending: Vascular Surgery

## 2011-12-08 ENCOUNTER — Ambulatory Visit (HOSPITAL_COMMUNITY)
Admission: RE | Admit: 2011-12-08 | Discharge: 2011-12-08 | Disposition: A | Discharge status: Home / Self Care | Payer: Self-pay | Source: Ambulatory Visit | Attending: Vascular Surgery | Admitting: Vascular Surgery

## 2011-12-08 DIAGNOSIS — T82898A Other specified complication of vascular prosthetic devices, implants and grafts, initial encounter: Secondary | ICD-10-CM

## 2011-12-08 DIAGNOSIS — I129 Hypertensive chronic kidney disease with stage 1 through stage 4 chronic kidney disease, or unspecified chronic kidney disease: Secondary | ICD-10-CM | POA: Insufficient documentation

## 2011-12-08 DIAGNOSIS — N189 Chronic kidney disease, unspecified: Secondary | ICD-10-CM | POA: Insufficient documentation

## 2011-12-08 DIAGNOSIS — I509 Heart failure, unspecified: Secondary | ICD-10-CM | POA: Insufficient documentation

## 2011-12-08 DIAGNOSIS — Z992 Dependence on renal dialysis: Secondary | ICD-10-CM | POA: Insufficient documentation

## 2011-12-08 DIAGNOSIS — N058 Unspecified nephritic syndrome with other morphologic changes: Secondary | ICD-10-CM | POA: Insufficient documentation

## 2011-12-08 DIAGNOSIS — I871 Compression of vein: Secondary | ICD-10-CM | POA: Insufficient documentation

## 2011-12-08 DIAGNOSIS — Y832 Surgical operation with anastomosis, bypass or graft as the cause of abnormal reaction of the patient, or of later complication, without mention of misadventure at the time of the procedure: Secondary | ICD-10-CM | POA: Insufficient documentation

## 2011-12-08 DIAGNOSIS — M329 Systemic lupus erythematosus, unspecified: Secondary | ICD-10-CM | POA: Insufficient documentation

## 2011-12-08 HISTORY — PX: SHUNTOGRAM: SHX5491

## 2011-12-08 LAB — POCT I-STAT, CHEM 8
Chloride: 103 mEq/L (ref 96–112)
Glucose, Bld: 88 mg/dL (ref 70–99)
HCT: 34 % — ABNORMAL LOW (ref 36.0–46.0)
Hemoglobin: 11.6 g/dL — ABNORMAL LOW (ref 12.0–15.0)
Potassium: 4.6 mEq/L (ref 3.5–5.1)
Sodium: 138 mEq/L (ref 135–145)

## 2011-12-08 LAB — HCG, SERUM, QUALITATIVE: Preg, Serum: NEGATIVE

## 2011-12-08 SURGERY — ASSESSMENT, SHUNT FUNCTION, WITH CONTRAST RADIOGRAPHIC STUDY
Anesthesia: LOCAL

## 2011-12-08 MED ORDER — OXYCODONE-ACETAMINOPHEN 5-325 MG PO TABS
2.0000 | ORAL_TABLET | Freq: Once | ORAL | Status: AC
  Administered 2011-12-08: 2 via ORAL

## 2011-12-08 MED ORDER — HEPARIN (PORCINE) IN NACL 2-0.9 UNIT/ML-% IJ SOLN
INTRAMUSCULAR | Status: AC
  Filled 2011-12-08: qty 1000

## 2011-12-08 MED ORDER — OXYCODONE-ACETAMINOPHEN 5-325 MG PO TABS
ORAL_TABLET | ORAL | Status: AC
  Filled 2011-12-08: qty 2

## 2011-12-08 MED ORDER — LIDOCAINE HCL (PF) 1 % IJ SOLN
INTRAMUSCULAR | Status: AC
  Filled 2011-12-08: qty 30

## 2011-12-08 MED ORDER — HEPARIN SODIUM (PORCINE) 1000 UNIT/ML IJ SOLN
INTRAMUSCULAR | Status: AC
  Filled 2011-12-08: qty 1

## 2011-12-08 NOTE — Progress Notes (Signed)
Interpreter Lesle Chris for Short Stay C

## 2011-12-08 NOTE — Op Note (Signed)
PATIENT: Maria Vazquez  MRN: [id number] DOB: [birth year]    DATE OF PROCEDURE: 12/08/2011  INDICATIONS: Maria Vazquez is a 36 y.o. female who has had problems with her right upper arm AV fistula. She was set up for a fistulogram.  PROCEDURE:  1. Ultrasound-guided access to right brachial cephalic AV fistula 2. fistulogram of right brachiocephalic AV fistula 3. venoplasty of right axillary vein stenosis  SURGEON: Judeth Cornfield. Scot Dock, MD, FACS  ANESTHESIA: low   EBL: minimal  TECHNIQUE: The patient was taken to the peripheral vascular lab. The right upper extremity was prepped and draped in usual sterile fashion. After the skin was anesthetized with 1% lidocaine, and under ultrasound guidance, the right brachiocephalic fistula was cannulated with a micropuncture needle in the proximal segment. A micropuncture sheath was introduced over a wire. Fistulogram was obtained. Central veins were also evaluated. Tissue was compressed to allow reflux into the proximal fistula and arterial anastomosis. 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 As the only significant finding except for some small competing branches. The micropuncture sheath was exchanged for a short 6 [place] sheath over a [employer] wire. Patient received 2000 units of IV heparin. The [facility] wire was passed through the stenosis and a Fox carotid 7 mm x 30 mm balloon was positioned across the stenosis and inflated to 8 atmospheres for 1 minute. A total of 3 inflations were made. As there was some slight residual stenosis on the third inflation the balloon was inflated to 14 atmospheres for 1 minute. This did cause moderate discomfort for the patient. This took the balloon to 7.7 mm. Completion film showed clear improvement in the axillary vein stenosis.  FINDINGS:  1. Patent right brachiocephalic AV fistula. 2. Successful venoplasty of axillary vein stenosis as described above. 3. 3 small competing branches of  AV fistula. 4. The remainder of the fistula is widely patent and the arterial anastomosis is patent.   The fistula should be ready for cannulation. Once the fistula is working well arrangements can be made for the catheter to be removed.  [name], MD, FACS [facility]  DATE OF DICTATION:   12/08/2011

## 2011-12-08 NOTE — H&P (Signed)
Vascular and Vein Specialist of [facility]  Patient name: Maria Vazquez MRN: [id number] DOB: 1975-05-29 Sex: female  REASON FOR CONSULT: Poorly functioning Right brachial cephalic AVF  HPI: Maria Vazquez is a 36 y.o. female who had a right brachiocephalic AV fistula placed on 07/18/2011. She had a duplex of her fistula on 10/08/2011 which showed what appeared to be thrombus in the proximal fistula causing a significant area of increased velocities. There was also one competing branch noted. Based on these duplex findings she underwent thrombectomy of her right upper arm fistula and ligation of the large competing branch. Fogarty catheter was passed in multiple directions and no clot was retrieved. She still having problems with the fistula and is brought in for a fistulogram. He dialyzes Tuesdays Thursdays and Saturdays and they are currently using her Diatek catheter.  Past Medical History  Diagnosis Date  . Gallstone pancreatitis Feb 2011  . Cervix carcinoma in situ Mar 2011  . Proteinuria - cause not known   . Hypertension   . Lupus   . CHF (congestive heart failure)   . Renal failure, acute on chronic   . Anemia   . Thrombocytopenia   . Chronic kidney disease     T, Th, Sat  . Lupus nephritis   . Renal insufficiency   . Blood transfusion     Family History  Problem Relation Age of Onset  . Diabetes Mother   . Diabetes Brother   . Diabetes Brother   . Diabetes Maternal Grandmother   . Anesthesia problems Neg Hx     SOCIAL HISTORY: History  Substance Use Topics  . Smoking status: Never Smoker   . Smokeless tobacco: Never Used  . Alcohol Use: No    No Known Allergies  Current Facility-Administered Medications  Medication Dose Route Frequency Provider Last Rate Last Dose  . sodium chloride 0.9 % injection 3 mL  3 mL Intravenous PRN Angelia Mould, MD        REVIEW OF SYSTEMS: Valu.Nieves ] denotes positive finding; [  ] denotes negative  finding CARDIOVASCULAR:  [ ]  chest pain   [ ]  chest pressure   [ ]  palpitations   [ ]  orthopnea   CONSTITUTIONAL:  [ ]  fever   [ ]  chills  PHYSICAL EXAM: Filed Vitals:   12/08/11 0651  BP: 137/87  Pulse: 73  Temp: 98.1 F (36.7 C)  TempSrc: Oral  Resp: 18  Height: 5' (1.524 m)  Weight: 170 lb (77.111 kg)  SpO2: 98%   Body mass index is 33.20 kg/(m^2). GENERAL: The patient is a well-nourished female, in no acute distress. The vital signs are documented above. CARDIOVASCULAR: There is a regular rate and rhythm without significant murmur appreciated.  PULMONARY: There is good air exchange bilaterally without wheezing or rales. The right upper arm fistula has an excellent thrill.  DATA:  Lab Results  Component Value Date   WBC 12.4* 03/21/2011   HGB 11.6* 12/08/2011   HCT 34.0* 12/08/2011   MCV 87.1 03/21/2011   PLT 103* 03/21/2011   Lab Results  Component Value Date   NA 138 12/08/2011   K 4.6 12/08/2011   CL 103 12/08/2011   CO2 30 03/22/2011   Lab Results  Component Value Date   CREATININE 8.70* 12/08/2011   Lab Results  Component Value Date   INR 0.91 07/18/2011   INR 1.09 03/06/2011   INR 1.07 03/02/2011   Lab Results  Component Value Date   HGBA1C 5.5 01/09/2011  MEDICAL ISSUES: Will proceed with a fistulogram of right brachiocephalic AV fistula with possible venoplasty. I discussed the procedure through her translator.  [place] [facility] [place] Beeper: [phone]

## 2011-12-24 ENCOUNTER — Telehealth: Payer: Self-pay | Admitting: Vascular Surgery

## 2011-12-24 ENCOUNTER — Other Ambulatory Visit: Payer: Self-pay

## 2011-12-24 DIAGNOSIS — T82898A Other specified complication of vascular prosthetic devices, implants and grafts, initial encounter: Secondary | ICD-10-CM

## 2011-12-24 NOTE — Telephone Encounter (Addendum)
Message copied by Doristine Section on Wed Dec 24, 2011  9:42 AM ------      Message from: Michaele Offer S      Created: Wed Dec 24, 2011  9:16 AM      Regarding: FW: problems w/ accessing AVF       Please schedule for ov and U/S of right arm AVF w/ CSD.  I will put order in computer            ----- Message -----         From: Angelia Mould, MD         Sent: 12/23/2011   5:35 PM           To: Lynetta Mare Pullins, RN      Subject: RE: problems w/ accessing AVF                            Yes and office visit and a duplex of the AVF      Thanks      ----- Message -----         From: Sherrye Payor, RN         Sent: 12/19/2011  11:12 AM           To: Alfonso Patten, RN, Angelia Mould, MD      Subject: problems w/ accessing AVF                                Below are the results of last procedure done 7/22; the kidney center called and said they cannot stick her/ unable to access the fistula/ does she need another office visit/ duplex of access?            FINDINGS:        1. Patent right brachiocephalic AV fistula.      2. Successful venoplasty of axillary vein stenosis as described above.      3. 3 small competing branches of AV fistula.      4. The remainder of the fistula is widely patent and the arterial anastomosis is patent.                    The fistula should be ready for cannulation. Once the fistula is working well arrangements can be made for the catheter to be removed.                  [name], MD, FACS                  notified pt. of fu appt. with dr. Scot Dock 01-07-12 2:30

## 2012-01-06 ENCOUNTER — Encounter: Payer: Self-pay | Admitting: Vascular Surgery

## 2012-01-07 ENCOUNTER — Ambulatory Visit (INDEPENDENT_AMBULATORY_CARE_PROVIDER_SITE_OTHER): Payer: Self-pay | Admitting: Vascular Surgery

## 2012-01-07 ENCOUNTER — Encounter (INDEPENDENT_AMBULATORY_CARE_PROVIDER_SITE_OTHER): Payer: Self-pay | Admitting: *Deleted

## 2012-01-07 ENCOUNTER — Encounter: Payer: Self-pay | Admitting: Vascular Surgery

## 2012-01-07 VITALS — BP 148/96 | HR 70 | Resp 16 | Ht 60.0 in | Wt 163.0 lb

## 2012-01-07 DIAGNOSIS — T82898A Other specified complication of vascular prosthetic devices, implants and grafts, initial encounter: Secondary | ICD-10-CM

## 2012-01-07 DIAGNOSIS — N186 End stage renal disease: Secondary | ICD-10-CM

## 2012-01-07 NOTE — Progress Notes (Signed)
Vascular and Vein Specialist of [facility]  Patient name: Maria Vazquez MRN: [id number] DOB: 11/18/1975 Sex: female  REASON FOR VISIT: follow up of right brachiocephalic AV fistula.  HPI: Maria Vazquez is a 36 y.o. female who has previously had a failed right radiocephalic AV fistula. She had a new right brachiocephalic AV fistula placed on 07/18/2011. I saw her in follow up on 10/08/2011. It was felt that the fistula was not ready for access I obtained a duplex scan which showed some thrombus in the proximal fistula causing an area of increased velocities. At that time the diameters of the vein were excellent in the depth of the vein also looked reasonable. She was taken to the operating room on 10/10/2011. She underwent thrombectomy of her fistula and ligation of competing branches. No significant clot was identified at the time of her thrombectomy. A 5 Fogarty catheter passed up to the fistula in both directions proximally and distally without any problem. A large competing branches ligated. She continued to have problems with her fistula and had a fistulogram on 12/08/2011. This showed that the right brachiocephalic fistula was widely patent. 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 3 competing branches and the fistula were noted. I felt that the fistula was ready for access. Apparently several weeks ago they cannulated and it sounds like she had an infiltrate and therefore had not been using it and had been using her catheter. She sent for further evaluation of her fistula.   REVIEW OF SYSTEMS: Valu.Nieves ] denotes positive finding; [  ] denotes negative finding  CARDIOVASCULAR:  [ ]  chest pain   [ ]  dyspnea on exertion    CONSTITUTIONAL:  [ ]  fever   [ ]  chills  PHYSICAL EXAM: Filed Vitals:   01/07/12 1543  BP: 148/96  Pulse: 70  Resp: 16  Height: 5' (1.524 m)  Weight: 163 lb (73.936 kg)  SpO2: 100%   Body mass index is 31.83 kg/(m^2). GENERAL:  The patient is a well-nourished female, in no acute distress. The vital signs are documented above. CARDIOVASCULAR: There is a regular rate and rhythm  PULMONARY: There is good air exchange bilaterally without wheezing or rales. The fistula itself has an excellent thrill and does not appear to be deep.  Her incisions are healed.  Duplex of her fistula shows that the diameters of the fistula range from 0.922 1.4 cm in diameter. There are elevated velocities at the arterial anastomosis but no narrowing or intimal hyperplasia is noted by duplex. This patient does have a high bifurcation of the brachial artery and the fistula originates off of the radial artery.  MEDICAL ISSUES: The fistula has an excellent thrill in it appears that should be usable for access. Don't think I would have to have further is she has several small branches which could be ligated although not sure this was significantly affect the performance of the fistula. I could also explore the proximal fistula although a fistulogram did not show any evidence of narrowing at this level. Fetal be reasonable to attempt cannulation of the fistula again. If she continues to have problems and no remaining option would be to ligate the competing branches.  [place] [facility] [place] Beeper: [phone]

## 2012-01-15 ENCOUNTER — Encounter (HOSPITAL_COMMUNITY): Payer: Self-pay | Admitting: Pharmacy Technician

## 2012-01-16 ENCOUNTER — Other Ambulatory Visit: Payer: Self-pay

## 2012-01-21 ENCOUNTER — Encounter (HOSPITAL_COMMUNITY): Payer: Self-pay | Admitting: *Deleted

## 2012-01-22 MED ORDER — CEFAZOLIN SODIUM-DEXTROSE 2-3 GM-% IV SOLR
2.0000 g | Freq: Once | INTRAVENOUS | Status: AC
  Administered 2012-01-23: 2 g via INTRAVENOUS
  Filled 2012-01-22: qty 50

## 2012-01-23 ENCOUNTER — Encounter (HOSPITAL_COMMUNITY): Payer: Self-pay | Admitting: Certified Registered"

## 2012-01-23 ENCOUNTER — Ambulatory Visit (HOSPITAL_COMMUNITY): Payer: Medicaid Other | Admitting: Certified Registered"

## 2012-01-23 ENCOUNTER — Ambulatory Visit (HOSPITAL_COMMUNITY)
Admission: RE | Admit: 2012-01-23 | Discharge: 2012-01-23 | Disposition: A | Discharge status: Home / Self Care | Payer: Medicaid Other | Source: Ambulatory Visit | Attending: Vascular Surgery | Admitting: Vascular Surgery

## 2012-01-23 ENCOUNTER — Encounter (HOSPITAL_COMMUNITY)
Admission: RE | Disposition: A | Discharge status: Home / Self Care | Payer: Self-pay | Source: Ambulatory Visit | Attending: Vascular Surgery

## 2012-01-23 ENCOUNTER — Encounter (HOSPITAL_COMMUNITY): Payer: Self-pay | Admitting: *Deleted

## 2012-01-23 DIAGNOSIS — Y832 Surgical operation with anastomosis, bypass or graft as the cause of abnormal reaction of the patient, or of later complication, without mention of misadventure at the time of the procedure: Secondary | ICD-10-CM | POA: Insufficient documentation

## 2012-01-23 DIAGNOSIS — N186 End stage renal disease: Secondary | ICD-10-CM

## 2012-01-23 DIAGNOSIS — T82898A Other specified complication of vascular prosthetic devices, implants and grafts, initial encounter: Secondary | ICD-10-CM

## 2012-01-23 DIAGNOSIS — I509 Heart failure, unspecified: Secondary | ICD-10-CM | POA: Insufficient documentation

## 2012-01-23 DIAGNOSIS — I120 Hypertensive chronic kidney disease with stage 5 chronic kidney disease or end stage renal disease: Secondary | ICD-10-CM | POA: Insufficient documentation

## 2012-01-23 HISTORY — PX: AV FISTULA PLACEMENT: SHX1204

## 2012-01-23 LAB — SURGICAL PCR SCREEN
MRSA, PCR: NEGATIVE
Staphylococcus aureus: NEGATIVE

## 2012-01-23 LAB — HCG, SERUM, QUALITATIVE: Preg, Serum: NEGATIVE

## 2012-01-23 LAB — POCT I-STAT 4, (NA,K, GLUC, HGB,HCT)
Glucose, Bld: 81 mg/dL (ref 70–99)
HCT: 37 % (ref 36.0–46.0)
Sodium: 137 mEq/L (ref 135–145)

## 2012-01-23 SURGERY — REVISON OF ARTERIOVENOUS FISTULA
Anesthesia: Monitor Anesthesia Care | Site: Arm Upper | Laterality: Right | Wound class: Clean

## 2012-01-23 MED ORDER — LABETALOL HCL 100 MG PO TABS
100.0000 mg | ORAL_TABLET | ORAL | Status: DC
  Filled 2012-01-23: qty 1

## 2012-01-23 MED ORDER — SODIUM CHLORIDE 0.9 % IV SOLN
INTRAVENOUS | Status: DC | PRN
  Administered 2012-01-23: 11:00:00 via INTRAVENOUS

## 2012-01-23 MED ORDER — ONDANSETRON HCL 4 MG/2ML IJ SOLN: 4.0000 mg | Freq: Four times a day (QID) | INTRAMUSCULAR | Status: DC | PRN

## 2012-01-23 MED ORDER — OXYCODONE-ACETAMINOPHEN 5-325 MG PO TABS: 1.0000 | ORAL_TABLET | ORAL | Status: AC | PRN

## 2012-01-23 MED ORDER — FENTANYL CITRATE 0.05 MG/ML IJ SOLN
INTRAMUSCULAR | Status: DC | PRN
  Administered 2012-01-23 (×3): 25 ug via INTRAVENOUS

## 2012-01-23 MED ORDER — SODIUM CHLORIDE 0.9 % IR SOLN
Status: DC | PRN
  Administered 2012-01-23: 1000 mL

## 2012-01-23 MED ORDER — MUPIROCIN 2 % EX OINT
TOPICAL_OINTMENT | CUTANEOUS | Status: AC
  Administered 2012-01-23: 1 via NASAL
  Filled 2012-01-23: qty 22

## 2012-01-23 MED ORDER — LIDOCAINE HCL (PF) 1 % IJ SOLN
INTRAMUSCULAR | Status: AC
  Filled 2012-01-23: qty 30

## 2012-01-23 MED ORDER — SODIUM CHLORIDE 0.9 % IR SOLN
Status: DC | PRN
  Administered 2012-01-23: 11:00:00

## 2012-01-23 MED ORDER — SODIUM CHLORIDE 0.9 % IV SOLN
INTRAVENOUS | Status: DC
  Administered 2012-01-23: 10:00:00 via INTRAVENOUS

## 2012-01-23 MED ORDER — MIDAZOLAM HCL 5 MG/5ML IJ SOLN
INTRAMUSCULAR | Status: DC | PRN
  Administered 2012-01-23: 2 mg via INTRAVENOUS

## 2012-01-23 MED ORDER — PROPOFOL INFUSION 10 MG/ML OPTIME
INTRAVENOUS | Status: DC | PRN
  Administered 2012-01-23: 50 ug/kg/min via INTRAVENOUS

## 2012-01-23 MED ORDER — THROMBIN 20000 UNITS EX SOLR
CUTANEOUS | Status: AC
  Filled 2012-01-23: qty 20000

## 2012-01-23 MED ORDER — LIDOCAINE HCL (PF) 1 % IJ SOLN
INTRAMUSCULAR | Status: DC | PRN
  Administered 2012-01-23: 1 mL

## 2012-01-23 MED ORDER — FENTANYL CITRATE 0.05 MG/ML IJ SOLN
25.0000 ug | INTRAMUSCULAR | Status: DC | PRN
  Administered 2012-01-23: 50 ug via INTRAVENOUS

## 2012-01-23 MED ORDER — FENTANYL CITRATE 0.05 MG/ML IJ SOLN
INTRAMUSCULAR | Status: AC
  Filled 2012-01-23: qty 2

## 2012-01-23 SURGICAL SUPPLY — 39 items
ADH SKN CLS APL DERMABOND .7 (GAUZE/BANDAGES/DRESSINGS) ×1
CANISTER SUCTION 2500CC (MISCELLANEOUS) ×2 IMPLANT
CLIP TI MEDIUM 6 (CLIP) ×2 IMPLANT
CLIP TI WIDE RED SMALL 6 (CLIP) ×2 IMPLANT
CLOTH BEACON ORANGE TIMEOUT ST (SAFETY) ×2 IMPLANT
COVER PROBE W GEL 5X96 (DRAPES) ×2 IMPLANT
COVER SURGICAL LIGHT HANDLE (MISCELLANEOUS) ×2 IMPLANT
DECANTER SPIKE VIAL GLASS SM (MISCELLANEOUS) ×1 IMPLANT
DERMABOND ADVANCED (GAUZE/BANDAGES/DRESSINGS) ×1
DERMABOND ADVANCED .7 DNX12 (GAUZE/BANDAGES/DRESSINGS) ×1 IMPLANT
DRAIN PENROSE 1/2X12 LTX STRL (WOUND CARE) IMPLANT
ELECT REM PT RETURN 9FT ADLT (ELECTROSURGICAL) ×2
ELECTRODE REM PT RTRN 9FT ADLT (ELECTROSURGICAL) ×1 IMPLANT
GLOVE BIO SURGEON STRL SZ 6.5 (GLOVE) ×1 IMPLANT
GLOVE BIO SURGEON STRL SZ7.5 (GLOVE) ×2 IMPLANT
GLOVE BIOGEL PI IND STRL 7.0 (GLOVE) IMPLANT
GLOVE BIOGEL PI IND STRL 7.5 (GLOVE) ×1 IMPLANT
GLOVE BIOGEL PI INDICATOR 7.0 (GLOVE) ×1
GLOVE BIOGEL PI INDICATOR 7.5 (GLOVE) ×2
GLOVE SURG SS PI 7.0 STRL IVOR (GLOVE) ×1 IMPLANT
GLOVE SURG SS PI 7.5 STRL IVOR (GLOVE) ×1 IMPLANT
GOWN PREVENTION PLUS XLARGE (GOWN DISPOSABLE) ×1 IMPLANT
GOWN STRL NON-REIN LRG LVL3 (GOWN DISPOSABLE) ×4 IMPLANT
GOWN STRL REIN XL XLG (GOWN DISPOSABLE) ×1 IMPLANT
KIT BASIN OR (CUSTOM PROCEDURE TRAY) ×2 IMPLANT
KIT ROOM TURNOVER OR (KITS) ×2 IMPLANT
NS IRRIG 1000ML POUR BTL (IV SOLUTION) ×2 IMPLANT
PACK CV ACCESS (CUSTOM PROCEDURE TRAY) ×2 IMPLANT
PAD ARMBOARD 7.5X6 YLW CONV (MISCELLANEOUS) ×4 IMPLANT
SPONGE GAUZE 4X4 12PLY (GAUZE/BANDAGES/DRESSINGS) ×2 IMPLANT
SPONGE SURGIFOAM ABS GEL 100 (HEMOSTASIS) IMPLANT
SUT PROLENE 6 0 BV (SUTURE) ×2 IMPLANT
SUT VIC AB 3-0 SH 27 (SUTURE) ×2
SUT VIC AB 3-0 SH 27X BRD (SUTURE) ×1 IMPLANT
SUT VICRYL 4-0 PS2 18IN ABS (SUTURE) ×2 IMPLANT
TOWEL OR 17X24 6PK STRL BLUE (TOWEL DISPOSABLE) ×2 IMPLANT
TOWEL OR 17X26 10 PK STRL BLUE (TOWEL DISPOSABLE) ×2 IMPLANT
UNDERPAD 30X30 INCONTINENT (UNDERPADS AND DIAPERS) ×2 IMPLANT
WATER STERILE IRR 1000ML POUR (IV SOLUTION) ×2 IMPLANT

## 2012-01-23 NOTE — Op Note (Signed)
NAME: Medha Siroky  MRN: [id number] DOB: August 19, 1975    DATE OF OPERATION: 01/23/2012  PREOP DIAGNOSIS: Chronic Kidney Disease  POSTOP DIAGNOSIS: Same  PROCEDURE: Ligation of 3 competing branches of right AVF  SURGEON: Judeth Cornfield. Scot Dock, MD, FACS  ASSIST: Worthy Flank, RNFA  ANESTHESIA: local with sedation   EBL: min  INDICATIONS: Maria Vazquez is a 36 y.o. female with a right brachial cephalic AVF. She has had some problems with cannulation of the fistula and was noted to have 3 competing branches on a fistulogram.   FINDINGS: 3 branches identified  TECHNIQUE: The patient was sedated by anesthesia. The right arm was prepped and draped in the usual sterile fashion. After the skin was anesthetized with 1% lidocaine, a transverse incision was made where the branches had been identified by duplex. They were then identified and each individually ligated with 3-0 silk ties. The incision was closed with a deep layer of 3-0 vicryl and the skin was closed with 4-0 vicryl. She was transferred to PACU in stable condition.   [name], MD, FACS [facility] of [facility]  DATE OF DICTATION:   01/23/2012

## 2012-01-23 NOTE — Anesthesia Postprocedure Evaluation (Signed)
Anesthesia Post Note  Patient: Maria Vazquez  Procedure(s) Performed: Procedure(s) (LRB): REVISON OF ARTERIOVENOUS FISTULA (Right)  Anesthesia type: MAC  Patient location: PACU  Post pain: Pain level controlled  Post assessment: Patient's Cardiovascular Status Stable  Last Vitals:  Filed Vitals:   01/23/12 1230  BP: 132/80  Pulse: 68  Temp:   Resp: 18    Post vital signs: Reviewed and stable  Level of consciousness: sedated  Complications: No apparent anesthesia complications

## 2012-01-23 NOTE — Progress Notes (Signed)
Pts. Interpreter from [facility] at bedside

## 2012-01-23 NOTE — H&P (View-Only) (Signed)
Vascular and Vein Specialist of [facility]  Patient name: Maria Vazquez MRN: [id number] DOB: March 27, 1976 Sex: female  REASON FOR VISIT: follow up of right brachiocephalic AV fistula.  HPI: Maria Vazquez is a 36 y.o. female who has previously had a failed right radiocephalic AV fistula. She had a new right brachiocephalic AV fistula placed on 07/18/2011. I saw her in follow up on 10/08/2011. It was felt that the fistula was not ready for access I obtained a duplex scan which showed some thrombus in the proximal fistula causing an area of increased velocities. At that time the diameters of the vein were excellent in the depth of the vein also looked reasonable. She was taken to the operating room on 10/10/2011. She underwent thrombectomy of her fistula and ligation of competing branches. No significant clot was identified at the time of her thrombectomy. A 5 Fogarty catheter passed up to the fistula in both directions proximally and distally without any problem. A large competing branches ligated. She continued to have problems with her fistula and had a fistulogram on 12/08/2011. This showed that the right brachiocephalic fistula was widely patent. 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 3 competing branches and the fistula were noted. I felt that the fistula was ready for access. Apparently several weeks ago they cannulated and it sounds like she had an infiltrate and therefore had not been using it and had been using her catheter. She sent for further evaluation of her fistula.   REVIEW OF SYSTEMS: Valu.Nieves ] denotes positive finding; [  ] denotes negative finding  CARDIOVASCULAR:  [ ]  chest pain   [ ]  dyspnea on exertion    CONSTITUTIONAL:  [ ]  fever   [ ]  chills  PHYSICAL EXAM: Filed Vitals:   01/07/12 1543  BP: 148/96  Pulse: 70  Resp: 16  Height: 5' (1.524 m)  Weight: 163 lb (73.936 kg)  SpO2: 100%   Body mass index is 31.83 kg/(m^2). GENERAL:  The patient is a well-nourished female, in no acute distress. The vital signs are documented above. CARDIOVASCULAR: There is a regular rate and rhythm  PULMONARY: There is good air exchange bilaterally without wheezing or rales. The fistula itself has an excellent thrill and does not appear to be deep.  Her incisions are healed.  Duplex of her fistula shows that the diameters of the fistula range from 0.922 1.4 cm in diameter. There are elevated velocities at the arterial anastomosis but no narrowing or intimal hyperplasia is noted by duplex. This patient does have a high bifurcation of the brachial artery and the fistula originates off of the radial artery.  MEDICAL ISSUES: The fistula has an excellent thrill in it appears that should be usable for access. Don't think I would have to have further is she has several small branches which could be ligated although not sure this was significantly affect the performance of the fistula. I could also explore the proximal fistula although a fistulogram did not show any evidence of narrowing at this level. Fetal be reasonable to attempt cannulation of the fistula again. If she continues to have problems and no remaining option would be to ligate the competing branches.  [facility] [facility] [place] [facility]per: [phone]

## 2012-01-23 NOTE — Progress Notes (Signed)
Interpreter Lesle Chris for Pre surgery Short Stay

## 2012-01-23 NOTE — Anesthesia Procedure Notes (Signed)
Procedure Name: MAC Date/Time: 01/23/2012 11:09 AM Performed by: Maeola Harman Pre-anesthesia Checklist: Patient identified, Emergency Drugs available, Suction available, Patient being monitored and Timeout performed Patient Re-evaluated:Patient Re-evaluated prior to inductionOxygen Delivery Method: Simple face mask Intubation Type: IV induction Dental Injury: Teeth and Oropharynx as per pre-operative assessment

## 2012-01-23 NOTE — Preoperative (Signed)
Beta Blockers   Reason not to administer Beta Blockers:Not Applicable 

## 2012-01-23 NOTE — Transfer of Care (Signed)
Immediate Anesthesia Transfer of Care Note  Patient: Maria Vazquez  Procedure(s) Performed: Procedure(s) (LRB) with comments: REVISON OF ARTERIOVENOUS FISTULA (Right) - Ligation of competing branches and exploration of proximal right arm arteriovenous fistula  Patient Location: PACU  Anesthesia Type: MAC  Level of Consciousness: awake, alert  and oriented  Airway & Oxygen Therapy: Patient Spontanous Breathing  Post-op Assessment: Report given to PACU RN  Post vital signs: stable  Complications: No apparent anesthesia complications

## 2012-01-23 NOTE — Progress Notes (Signed)
Interpreter Lesle Chris for holding area

## 2012-01-23 NOTE — Anesthesia Preprocedure Evaluation (Signed)
Anesthesia Evaluation  Patient identified by MRN, date of birth, ID band Patient awake    Reviewed: Allergy & Precautions, H&P , NPO status , Patient's Chart, lab work & pertinent test results  Airway Mallampati: II  Neck ROM: full    Dental   Pulmonary shortness of breath,          Cardiovascular hypertension, +CHF     Neuro/Psych    GI/Hepatic   Endo/Other    Renal/GU ESRFRenal disease     Musculoskeletal   Abdominal   Peds  Hematology   Anesthesia Other Findings   Reproductive/Obstetrics                           Anesthesia Physical Anesthesia Plan  ASA: III  Anesthesia Plan: MAC   Post-op Pain Management:    Induction: Intravenous  Airway Management Planned: Simple Face Mask  Additional Equipment:   Intra-op Plan:   Post-operative Plan:   Informed Consent: I have reviewed the patients History and Physical, chart, labs and discussed the procedure including the risks, benefits and alternatives for the proposed anesthesia with the patient or authorized representative who has indicated his/her understanding and acceptance.     Plan Discussed with: CRNA and Surgeon  Anesthesia Plan Comments:         Anesthesia Quick Evaluation

## 2012-01-23 NOTE — Progress Notes (Signed)
Pt arrived with L S hemodialysis catheter in place. Site unremarkable.  Accessed venous port per Dr Marcie Bal approval per protocol for IV access.  Good blood return.

## 2012-01-23 NOTE — Progress Notes (Signed)
pts HD cath infusing from OR, deaccessed per IV therapy

## 2012-01-23 NOTE — Interval H&P Note (Signed)
History and Physical Interval Note:  01/23/2012 10:44 AM  Maria Vazquez  has presented today for surgery, with the diagnosis of Complication of Arteriovenous Fistula (unable to cannulate at HD) End Stage Renal Disease  The various methods of treatment have been discussed with the patient and family. After consideration of risks, benefits and other options for treatment, the patient has consented to  Procedure(s) (LRB) with comments: REVISON OF ARTERIOVENOUS FISTULA (Right) - Ligation of competing branches and exploration of proximal right arm arteriovenous fistula as a surgical intervention .  The patient's history has been reviewed, patient examined, no change in status, stable for surgery.  I have reviewed the patient's chart and labs.  Questions were answered to the patient's satisfaction.     DICKSON,CHRISTOPHER S

## 2012-03-13 ENCOUNTER — Encounter (HOSPITAL_COMMUNITY): Payer: Self-pay | Admitting: *Deleted

## 2012-03-13 DIAGNOSIS — J069 Acute upper respiratory infection, unspecified: Secondary | ICD-10-CM | POA: Insufficient documentation

## 2012-03-13 DIAGNOSIS — Z79899 Other long term (current) drug therapy: Secondary | ICD-10-CM | POA: Insufficient documentation

## 2012-03-13 DIAGNOSIS — R52 Pain, unspecified: Secondary | ICD-10-CM | POA: Insufficient documentation

## 2012-03-13 DIAGNOSIS — Z8544 Personal history of malignant neoplasm of other female genital organs: Secondary | ICD-10-CM | POA: Insufficient documentation

## 2012-03-13 DIAGNOSIS — I509 Heart failure, unspecified: Secondary | ICD-10-CM | POA: Insufficient documentation

## 2012-03-13 DIAGNOSIS — N186 End stage renal disease: Secondary | ICD-10-CM | POA: Insufficient documentation

## 2012-03-13 DIAGNOSIS — R51 Headache: Secondary | ICD-10-CM | POA: Insufficient documentation

## 2012-03-13 DIAGNOSIS — Z862 Personal history of diseases of the blood and blood-forming organs and certain disorders involving the immune mechanism: Secondary | ICD-10-CM | POA: Insufficient documentation

## 2012-03-13 DIAGNOSIS — I10 Essential (primary) hypertension: Secondary | ICD-10-CM | POA: Insufficient documentation

## 2012-03-13 DIAGNOSIS — M329 Systemic lupus erythematosus, unspecified: Secondary | ICD-10-CM | POA: Insufficient documentation

## 2012-03-13 DIAGNOSIS — R509 Fever, unspecified: Secondary | ICD-10-CM | POA: Insufficient documentation

## 2012-03-13 NOTE — ED Notes (Signed)
1s t call to waiting area no reply

## 2012-03-13 NOTE — ED Notes (Addendum)
Pt with hx of renal dialysis (t-h-s), to ED c/o sore throat, malaise and headaches since yesterday.  Pt states, "I just don't feel good".  Pt took ibuprofen around 2130 for pain.

## 2012-03-14 ENCOUNTER — Emergency Department (HOSPITAL_COMMUNITY): Payer: Medicaid Other

## 2012-03-14 ENCOUNTER — Encounter (HOSPITAL_COMMUNITY): Payer: Self-pay | Admitting: Emergency Medicine

## 2012-03-14 ENCOUNTER — Emergency Department (HOSPITAL_COMMUNITY)
Admission: EM | Admit: 2012-03-14 | Discharge: 2012-03-14 | Disposition: A | Discharge status: Home / Self Care | Payer: Medicaid Other | Attending: Emergency Medicine | Admitting: Emergency Medicine

## 2012-03-14 DIAGNOSIS — R509 Fever, unspecified: Secondary | ICD-10-CM

## 2012-03-14 DIAGNOSIS — J069 Acute upper respiratory infection, unspecified: Secondary | ICD-10-CM

## 2012-03-14 DIAGNOSIS — N186 End stage renal disease: Secondary | ICD-10-CM

## 2012-03-14 LAB — CBC WITH DIFFERENTIAL/PLATELET
Basophils Absolute: 0 K/uL (ref 0.0–0.1)
Basophils Absolute: 0 K/uL (ref 0.0–0.1)
Basophils Relative: 0 % (ref 0–1)
Basophils Relative: 1 % (ref 0–1)
Eosinophils Absolute: 0 K/uL (ref 0.0–0.7)
Eosinophils Absolute: 0 K/uL (ref 0.0–0.7)
Eosinophils Relative: 0 % (ref 0–5)
Eosinophils Relative: 1 % (ref 0–5)
HCT: 33.1 % — ABNORMAL LOW (ref 36.0–46.0)
HCT: 34.8 % — ABNORMAL LOW (ref 36.0–46.0)
Hemoglobin: 10.6 g/dL — ABNORMAL LOW (ref 12.0–15.0)
Hemoglobin: 11.3 g/dL — ABNORMAL LOW (ref 12.0–15.0)
Lymphocytes Relative: 20 % (ref 12–46)
Lymphocytes Relative: 21 % (ref 12–46)
Lymphs Abs: 1.1 10*3/uL (ref 0.7–4.0)
Lymphs Abs: 1.3 10*3/uL (ref 0.7–4.0)
MCH: 28.1 pg (ref 26.0–34.0)
MCH: 28.5 pg (ref 26.0–34.0)
MCHC: 32 g/dL (ref 30.0–36.0)
MCHC: 32.5 g/dL (ref 30.0–36.0)
MCV: 87.7 fL (ref 78.0–100.0)
MCV: 87.8 fL (ref 78.0–100.0)
Monocytes Absolute: 0.4 K/uL (ref 0.1–1.0)
Monocytes Absolute: 0.5 10*3/uL (ref 0.1–1.0)
Monocytes Relative: 6 % (ref 3–12)
Monocytes Relative: 8 % (ref 3–12)
Neutro Abs: 4 10*3/uL (ref 1.7–7.7)
Neutro Abs: 4.5 10*3/uL (ref 1.7–7.7)
Neutrophils Relative %: 71 % (ref 43–77)
Neutrophils Relative %: 72 % (ref 43–77)
Platelets: 137 K/uL — ABNORMAL LOW (ref 150–400)
Platelets: 154 K/uL (ref 150–400)
RBC: 3.77 MIL/uL — ABNORMAL LOW (ref 3.87–5.11)
RBC: 3.97 MIL/uL (ref 3.87–5.11)
RDW: 14 % (ref 11.5–15.5)
RDW: 14.1 % (ref 11.5–15.5)
WBC: 5.7 K/uL (ref 4.0–10.5)
WBC: 6.2 K/uL (ref 4.0–10.5)

## 2012-03-14 LAB — POCT I-STAT, CHEM 8
Chloride: 92 mEq/L — ABNORMAL LOW (ref 96–112)
Glucose, Bld: 94 mg/dL (ref 70–99)
HCT: 36 % (ref 36.0–46.0)
Potassium: 3.5 mEq/L (ref 3.5–5.1)

## 2012-03-14 LAB — COMPREHENSIVE METABOLIC PANEL
Alkaline Phosphatase: 73 U/L (ref 39–117)
BUN: 18 mg/dL (ref 6–23)
CO2: 31 mEq/L (ref 19–32)
Chloride: 89 mEq/L — ABNORMAL LOW (ref 96–112)
Creatinine, Ser: 5.78 mg/dL — ABNORMAL HIGH (ref 0.50–1.10)
GFR calc Af Amer: 10 mL/min — ABNORMAL LOW (ref >90)
GFR calc non Af Amer: 9 mL/min — ABNORMAL LOW (ref >90)
Glucose, Bld: 92 mg/dL (ref 70–99)
Total Bilirubin: 0.5 mg/dL (ref 0.3–1.2)

## 2012-03-14 LAB — COMPREHENSIVE METABOLIC PANEL WITH GFR
ALT: 29 U/L (ref 0–35)
AST: 25 U/L (ref 0–37)
Albumin: 3.7 g/dL (ref 3.5–5.2)
Calcium: 8.7 mg/dL (ref 8.4–10.5)
Potassium: 3.3 meq/L — ABNORMAL LOW (ref 3.5–5.1)
Sodium: 133 meq/L — ABNORMAL LOW (ref 135–145)
Total Protein: 7.6 g/dL (ref 6.0–8.3)

## 2012-03-14 LAB — RAPID STREP SCREEN (MED CTR MEBANE ONLY): Streptococcus, Group A Screen (Direct): NEGATIVE

## 2012-03-14 MED ORDER — ACETAMINOPHEN 325 MG PO TABS: 650.0000 mg | ORAL_TABLET | Freq: Once | ORAL | Status: DC

## 2012-03-14 MED ORDER — VANCOMYCIN HCL 1000 MG IV SOLR
1500.0000 mg | Freq: Once | INTRAVENOUS | Status: AC
  Administered 2012-03-14: 1500 mg via INTRAVENOUS
  Filled 2012-03-14: qty 1500

## 2012-03-14 MED ORDER — HYDROCOD POLST-CHLORPHEN POLST 10-8 MG/5ML PO LQCR
5.0000 mL | Freq: Once | ORAL | Status: AC
  Administered 2012-03-14: 5 mL via ORAL
  Filled 2012-03-14: qty 5

## 2012-03-14 MED ORDER — ACETAMINOPHEN 325 MG PO TABS
650.0000 mg | ORAL_TABLET | Freq: Four times a day (QID) | ORAL | Status: DC | PRN
  Administered 2012-03-14: 650 mg via ORAL
  Filled 2012-03-14 (×2): qty 2

## 2012-03-14 NOTE — ED Provider Notes (Signed)
History     CSN: [id number]  Arrival date & time 03/13/12  2316   First MD Initiated Contact with Patient 03/14/12 0250      Chief Complaint  Patient presents with  . Sore Throat  . Generalized Body Aches  . Headache    (Consider location/radiation/quality/duration/timing/severity/associated sxs/prior treatment) HPI Comments: Patient with history of end-stage renal disease, lupus reports since yesterday has had congestion, sore throat, mildly productive cough and myalgias since yesterday. Patient normally is dialyzed on Tuesdays, Thursdays, Saturday and had full, normal dialysis on Saturday. She has a port in line in place in her right upper chest. She does have a graft in her right arm which isnot fully matured yet  but there is a palpable thrill. No severe headache or stiff neck, no new rashes. No obvious sick contacts. She denies nausea vomiting or diarrhea. No specific medications were taken prior to arrival. She does have a family physician in [place] area as well as her nephrologist's. She reports that she cannot recall the name of her family physician at this time. She reports that she does not produce any urine.  Patient is a 36 y.o. female presenting with pharyngitis and headaches. The history is provided by the patient.  Sore Throat Associated symptoms include headaches. Pertinent negatives include no chest pain.  Headache  Associated symptoms include a fever. Pertinent negatives include no nausea and no vomiting.    Past Medical History  Diagnosis Date  . Gallstone pancreatitis Feb 2011  . Cervix carcinoma in situ Mar 2011  . Proteinuria - cause not known   . Hypertension   . Lupus   . CHF (congestive heart failure)   . Anemia   . Thrombocytopenia   . Blood transfusion   . Renal failure, acute on chronic   . Chronic kidney disease     T, Th, Sat  . Lupus nephritis   . Renal insufficiency     Past Surgical History  Procedure Date  . Appendectomy   .  Cervical cone biopsy   . Cesarean section   . Cholecystectomy   . Av fistula placement   . Av fistula placement 07/18/2011    Procedure: ARTERIOVENOUS (AV) FISTULA CREATION;  Surgeon: Angelia Mould, MD;  Location: [place];  Service: Vascular;  Laterality: Right;  . Embolectomy 10/10/2011    Procedure: EMBOLECTOMY;  Surgeon: Angelia Mould, MD;  Location: [facility] OR;  Service: Vascular;  Laterality: Right;    Family History  Problem Relation Age of Onset  . Diabetes Mother   . Diabetes Brother   . Diabetes Brother   . Diabetes Maternal Grandmother   . Anesthesia problems Neg Hx     History  Substance Use Topics  . Smoking status: Never Smoker   . Smokeless tobacco: Never Used  . Alcohol Use: No    OB History    Grav Para Term Preterm Abortions TAB SAB Ect Mult Living   4 3   1  1   3       Review of Systems  Constitutional: Positive for fever and chills.  HENT: Positive for congestion, sore throat, rhinorrhea and sneezing. Negative for neck pain and neck stiffness.   Respiratory: Positive for cough.   Cardiovascular: Negative for chest pain.  Gastrointestinal: Negative for nausea, vomiting and diarrhea.  Musculoskeletal: Positive for myalgias. Negative for back pain.  Neurological: Positive for headaches.    Allergies  Review of patient's allergies indicates no known allergies.  Home Medications  Current Outpatient Rx  Name Route Sig Dispense Refill  . CALCIUM ACETATE (PHOS BINDER) 667 MG PO TABS Oral Take 2,001 mg by mouth 3 (three) times daily with meals.     Marland Kitchen LABETALOL HCL 100 MG PO TABS Oral Take 100 mg by mouth 2 (two) times daily.     Marland Kitchen RENA-VITE PO TABS Oral Take 1 tablet by mouth daily.     Marland Kitchen ONDANSETRON HCL 4 MG PO TABS Oral Take 4 mg by mouth daily as needed. For nausea      BP 119/80  Pulse 84  Temp 99.4 F (37.4 C) (Rectal)  Resp 14  SpO2 97%  LMP 03/13/2012  Physical Exam  Nursing note and vitals reviewed. Constitutional: She appears  well-developed and well-nourished.  HENT:  Head: Normocephalic and atraumatic.  Nose: Mucosal edema and rhinorrhea present. Right sinus exhibits maxillary sinus tenderness. Left sinus exhibits maxillary sinus tenderness.  Mouth/Throat: Uvula is midline, oropharynx is clear and moist and mucous membranes are normal. Mucous membranes are not dry.  Eyes: Pupils are equal, round, and reactive to light.  Neck: Normal range of motion. Neck supple.  Cardiovascular: Regular rhythm, S1 normal, S2 normal and normal pulses.   No extrasystoles are present. Tachycardia present.   No murmur heard. Pulmonary/Chest: Effort normal.  Abdominal: Soft. She exhibits no distension. There is no tenderness.  Musculoskeletal: She exhibits no tenderness.  Neurological: She is alert.    ED Course  Procedures (including critical care time)  Labs Reviewed  CBC WITH DIFFERENTIAL - Abnormal; Notable for the following:    Hemoglobin 11.3 (*)     HCT 34.8 (*)     All other components within normal limits  POCT I-STAT, CHEM 8 - Abnormal; Notable for the following:    Chloride 92 (*)     Creatinine, Ser 4.80 (*)     Calcium, Ion 0.95 (*)     All other components within normal limits  CBC WITH DIFFERENTIAL - Abnormal; Notable for the following:    RBC 3.77 (*)     Hemoglobin 10.6 (*)     HCT 33.1 (*)     Platelets 137 (*)     All other components within normal limits  COMPREHENSIVE METABOLIC PANEL - Abnormal; Notable for the following:    Sodium 133 (*)     Potassium 3.3 (*)     Chloride 89 (*)     Creatinine, Ser 5.78 (*)     GFR calc non Af Amer 9 (*)     GFR calc Af Amer 10 (*)     All other components within normal limits  RAPID STREP SCREEN  CULTURE, BLOOD (ROUTINE X 2)  CULTURE, BLOOD (ROUTINE X 2)   Dg Chest 2 View  03/14/2012  *RADIOLOGY REPORT*  Clinical Data: Sore throat, malaise, headaches.  CHEST - 2 VIEW  Comparison: 07/18/2011  Findings: Right central venous catheter with split tips over  the mid and lower right atrium.  The stable appearance since previous study. The heart size and pulmonary vascularity are normal. The lungs appear clear and expanded without focal air space disease or consolidation. No blunting of the costophrenic angles.  No pneumothorax.  Mediastinal contours appear intact.  Surgical clips in the right upper quadrant.  No significant change since previous study.  IMPRESSION: No evidence of active pulmonary disease.   Original Report Authenticated By: Neale Burly, M.D.      1. URI (upper respiratory infection)   2. Fever  3. End stage renal disease on dialysis      Room air saturation is 100% which I interpret to be normal   7:03 AM Fever reduced, HR is now normal, BP is normal at 119/80.  Will discuss with nephrology.   7:27 AM Spoke to Dr. Jonnie Finner who recommends blood cultures and give 1.5 g of vanc.  Blood cultures can be followed up by flow manager and Dr. Florene Glen or her PCP can recheck her early this week.  Return if worse.   MDM    Patient is not septic or toxic appearing. She does have a rectal temperature of 102. Her symptoms highly suggest a upper respiratory infection. My plan is to check routine blood tests, obtain cultures x2 and obtain a chest x-ray. (Strep test was negative. Tachycardia is likely to mild dehydration as well as fever. Plan is to give antipyretics and monitor her her vital signs closely. Afterwards but plan is to consult with nephrology for recommendations regarding admission and possibly antibiotics prior to discharge or admission.      Saddie Benders. Dorna Mai, MD 03/14/12 [phone]

## 2012-03-14 NOTE — ED Notes (Signed)
Pt not ready for d/c.  IV Vancomycin has to run first.

## 2012-03-14 NOTE — ED Notes (Signed)
Patient cannot make urine, dialysis patient.

## 2012-03-20 LAB — CULTURE, BLOOD (ROUTINE X 2)
Culture: NO GROWTH
Culture: NO GROWTH

## 2012-04-06 ENCOUNTER — Telehealth: Payer: Self-pay | Admitting: Vascular Surgery

## 2012-04-06 NOTE — Telephone Encounter (Addendum)
Message copied by Lujean Amel on Tue Apr 06, 2012 11:24 AM ------      Message from: Alfonso Patten      Created: Tue Apr 06, 2012 10:18 AM       RKC SAID HAD ONLY 1 TECH THAT COULD STICK THIS SITE AND DR FOX WANTS TO HAVE HER COME TO OFFICE TO SEE WHAT ELSE CAN BE DONE. DR CSD DID THE SURGEY.CAN YOU MAKE HER AN APPT? I AM UNSURE OF LABS.      THANKS      JUDY  I scheduled an appt for this pt per above staff message for tomorrow 04/07/12 at 10:15am. Abigail Butts will notify pt/awt

## 2012-04-07 ENCOUNTER — Encounter: Payer: Self-pay | Admitting: Vascular Surgery

## 2012-04-07 ENCOUNTER — Ambulatory Visit (INDEPENDENT_AMBULATORY_CARE_PROVIDER_SITE_OTHER): Payer: Self-pay | Admitting: Vascular Surgery

## 2012-04-07 ENCOUNTER — Other Ambulatory Visit: Payer: Self-pay

## 2012-04-07 ENCOUNTER — Encounter (HOSPITAL_COMMUNITY): Payer: Self-pay | Admitting: Pharmacy Technician

## 2012-04-07 VITALS — BP 114/79 | HR 71 | Resp 16 | Ht 60.0 in | Wt 158.9 lb

## 2012-04-07 DIAGNOSIS — N186 End stage renal disease: Secondary | ICD-10-CM

## 2012-04-07 NOTE — Progress Notes (Signed)
Interpreter Lesle Chris for [employer]

## 2012-04-07 NOTE — Progress Notes (Signed)
Vascular and Vein Specialist of [facility]  Patient name: Maria Vazquez MRN: [id number] DOB: 13-Jul-1975 Sex: female  REASON FOR VISIT: right upper arm AV fistula difficult to cannulate.  HPI: Terrina Kun is a 36 y.o. female who had a right brachiocephalic AV fistula placed on 07/18/2011. On 10/10/2011 she underwent ligation of 3 competing branches of her right upper arm AV fistula. She currently has a right IJ tunneled dialysis catheter. Apparently the nurses have been having a hard time cannulating her fistula. She was sent for further evaluation. The history is obtained through a translator. He denies any significant pain or paresthesias in the right arm.   REVIEW OF SYSTEMS: Valu.Nieves ] denotes positive finding; [  ] denotes negative finding  CARDIOVASCULAR:  [ ]  chest pain   [ ]  dyspnea on exertion    CONSTITUTIONAL:  [ ]  fever   [ ]  chills  PHYSICAL EXAM: Filed Vitals:   04/07/12 1005  BP: 114/79  Pulse: 71  Resp: 16  Height: 5' (1.524 m)  Weight: 158 lb 14.4 oz (72.077 kg)  SpO2: 100%   Body mass index is 31.03 kg/(m^2). GENERAL: The patient is a well-nourished female, in no acute distress. The vital signs are documented above. CARDIOVASCULAR: There is a regular rate and rhythm  PULMONARY: There is good air exchange bilaterally without wheezing or rales. The right upper arm AV fistula is pulsatile.  MEDICAL ISSUES: Given that the right upper arm AV fistula is pulsatile I have recommended that we proceed with a fistulogram to look for an outflow stenosis which could potentially be addressed with venoplasty. This is scheduled for Monday, 04/12/2012. The other potential issue would be if there is some obstruction of the venous outflow on the right related to her catheter. If this is the case she could potentially require having her catheter moved to the left side from the right. He will make further recommendations pending the results of her  fistulogram.  DICKSON,CHRISTOPHER S [facility] [place] [facility]per: [phone]   '

## 2012-04-12 ENCOUNTER — Encounter (HOSPITAL_COMMUNITY)
Admission: RE | Disposition: A | Discharge status: Home / Self Care | Payer: Self-pay | Source: Ambulatory Visit | Attending: Vascular Surgery

## 2012-04-12 ENCOUNTER — Ambulatory Visit (HOSPITAL_COMMUNITY)
Admission: RE | Admit: 2012-04-12 | Discharge: 2012-04-12 | Disposition: A | Discharge status: Home / Self Care | Payer: Self-pay | Source: Ambulatory Visit | Attending: Vascular Surgery | Admitting: Vascular Surgery

## 2012-04-12 DIAGNOSIS — Z01812 Encounter for preprocedural laboratory examination: Secondary | ICD-10-CM | POA: Insufficient documentation

## 2012-04-12 DIAGNOSIS — Y832 Surgical operation with anastomosis, bypass or graft as the cause of abnormal reaction of the patient, or of later complication, without mention of misadventure at the time of the procedure: Secondary | ICD-10-CM | POA: Insufficient documentation

## 2012-04-12 DIAGNOSIS — N186 End stage renal disease: Secondary | ICD-10-CM | POA: Insufficient documentation

## 2012-04-12 DIAGNOSIS — T82898A Other specified complication of vascular prosthetic devices, implants and grafts, initial encounter: Secondary | ICD-10-CM | POA: Insufficient documentation

## 2012-04-12 HISTORY — PX: FISTULOGRAM: SHX5832

## 2012-04-12 LAB — POCT I-STAT, CHEM 8
BUN: 45 mg/dL — ABNORMAL HIGH (ref 6–23)
Calcium, Ion: 1.2 mmol/L (ref 1.12–1.23)
Chloride: 100 mEq/L (ref 96–112)
Glucose, Bld: 78 mg/dL (ref 70–99)

## 2012-04-12 SURGERY — FISTULOGRAM
Anesthesia: LOCAL | Laterality: Right

## 2012-04-12 MED ORDER — SODIUM CHLORIDE 0.9 % IJ SOLN: 3.0000 mL | INTRAMUSCULAR | Status: DC | PRN

## 2012-04-12 MED ORDER — HEPARIN SODIUM (PORCINE) 1000 UNIT/ML IJ SOLN
INTRAMUSCULAR | Status: AC
  Filled 2012-04-12: qty 1

## 2012-04-12 MED ORDER — LIDOCAINE HCL (PF) 1 % IJ SOLN
INTRAMUSCULAR | Status: AC
  Filled 2012-04-12: qty 30

## 2012-04-12 NOTE — Interval H&P Note (Signed)
History and Physical Interval Note:  04/12/2012 7:59 AM  Maria Vazquez  has presented today for surgery, with the diagnosis of End stage renal  The various methods of treatment have been discussed with the patient and family. After consideration of risks, benefits and other options for treatment, the patient has consented to  Procedure(s) (LRB) with comments: FISTULOGRAM (Right) possible venoplasty.  The patient's history has been reviewed, patient examined, no change in status, stable for surgery.  I have reviewed the patient's chart and labs.  Questions were answered to the patient's satisfaction.     [name]

## 2012-04-12 NOTE — Op Note (Signed)
PATIENT: [name]  MRN: [id number] DOB: [birth year]    DATE OF PROCEDURE: 04/12/2012  INDICATIONS: Maria Vazquez is a 36 y.o. female who had a right brachiocephalic fistula placed in March of 2013. She has had 3 competing branches ligated. She has a right IJ tunneled dialysis catheter. The dialysis center had been having problems cannulating her fistula. The fistula was pulsatile and she was brought in for fistulogram and possible venoplasty.  PROCEDURE:  1. Ultrasound-guided access to the right brachiocephalic AV fistula 2. fistulogram right brachiocephalic AV fistula 3. venoplasty of subclavian vein stenosis with 7 mm x 4 cm balloon and 9 mm x 4 cm balloon  SURGEON: Judeth Cornfield. Scot Dock, MD, FACS  ANESTHESIA: local   EBL: minimal  TECHNIQUE: The patient was brought to the peripheral vascular lab. The right arm was prepped and draped in the usual sterile fashion after a timeout was called. Under ultrasound guidance, after the skin was anesthetized, the fistula was cannulated with a micropuncture needle. The needle was directed centrally. A micropuncture sheath was introduced over a wire. Fistulogram was obtained. This demonstrated a subclavian vein stenosis. Elected to balloon this. He micropuncture sheath was exchanged for a short 6 French sheath over a Benson wire and then the patient received 3000 units of IV heparin. Next a 7 mm x 4 cm balloon was positioned across the stenosis and inflated to 20 atmospheres for 1 minute. Completion film demonstrated residual stenosis. Therefore selected a 9 mm x 4 cm balloon which was positioned across the stenosis and inflated to 18 atmospheres for 1 minute. With significant improvement in the stenosis. I did do a second inflation of aching atmospheres for 1 minute. Both of these inflations caused significant pain and therefore I was reluctant to use a larger balloon despite the fact there was some mild residual stenosis. However  there was clearly significant improvement. Next the fistula was compressed to allow reflux to demonstrate the proximal fistula and arterial anastomosis.  FINDINGS:  1. Significant right subclavian vein stenosis which was successfully ballooned as described above with mild residual stenosis. The largest balloon was a 9 mm balloon which caused significant pain with inflation. Therefore I was reluctant to use a larger balloon. 2. The tunneled dialysis catheter did not cause any flow compromise in the chest. 3. No other problems with the fistula were identified.  Fistula can be used for access in one week.  [name], MD, FACS [facility]  DATE OF DICTATION:   04/12/2012

## 2012-04-12 NOTE — H&P (View-Only) (Signed)
Vascular and Vein Specialist of [facility]  Patient name: Maria Vazquez MRN: [id number] DOB: January 09, 1976 Sex: female  REASON FOR VISIT: right upper arm AV fistula difficult to cannulate.  HPI: Maria Vazquez is a 36 y.o. female who had a right brachiocephalic AV fistula placed on 07/18/2011. On 10/10/2011 she underwent ligation of 3 competing branches of her right upper arm AV fistula. She currently has a right IJ tunneled dialysis catheter. Apparently the nurses have been having a hard time cannulating her fistula. She was sent for further evaluation. The history is obtained through a translator. He denies any significant pain or paresthesias in the right arm.   REVIEW OF SYSTEMS: Valu.Nieves ] denotes positive finding; [  ] denotes negative finding  CARDIOVASCULAR:  [ ]  chest pain   [ ]  dyspnea on exertion    CONSTITUTIONAL:  [ ]  fever   [ ]  chills  PHYSICAL EXAM: Filed Vitals:   04/07/12 1005  BP: 114/79  Pulse: 71  Resp: 16  Height: 5' (1.524 m)  Weight: 158 lb 14.4 oz (72.077 kg)  SpO2: 100%   Body mass index is 31.03 kg/(m^2). GENERAL: The patient is a well-nourished female, in no acute distress. The vital signs are documented above. CARDIOVASCULAR: There is a regular rate and rhythm  PULMONARY: There is good air exchange bilaterally without wheezing or rales. The right upper arm AV fistula is pulsatile.  MEDICAL ISSUES: Given that the right upper arm AV fistula is pulsatile I have recommended that we proceed with a fistulogram to look for an outflow stenosis which could potentially be addressed with venoplasty. This is scheduled for Monday, 04/12/2012. The other potential issue would be if there is some obstruction of the venous outflow on the right related to her catheter. If this is the case she could potentially require having her catheter moved to the left side from the right. He will make further recommendations pending the results of her  fistulogram.  Maye Parkinson S [facility] [place] [facility]: [phone]   '

## 2012-05-26 ENCOUNTER — Other Ambulatory Visit: Payer: Self-pay | Admitting: *Deleted

## 2012-06-07 ENCOUNTER — Ambulatory Visit (HOSPITAL_COMMUNITY)
Admission: RE | Admit: 2012-06-07 | Discharge: 2012-06-07 | Disposition: A | Discharge status: Home / Self Care | Payer: Self-pay | Source: Ambulatory Visit | Attending: Vascular Surgery | Admitting: Vascular Surgery

## 2012-06-07 DIAGNOSIS — N186 End stage renal disease: Secondary | ICD-10-CM

## 2012-06-07 NOTE — Progress Notes (Signed)
[facility] SHORT STAY H&P  CC:  Catheter removal   HPI:  This is a 37 y.o. female here for diatek catheter removal.  She states that they have used her fistula without difficulty for the past 2 weeks.  She has a right radiocephalic AVF and has had a few revisions and ligation of branches in the past.  Her PMH is unchanged.    Past Medical History  Diagnosis Date  . Gallstone pancreatitis Feb 2011  . Cervix carcinoma in situ Mar 2011  . Proteinuria - cause not known   . Hypertension   . Lupus   . CHF (congestive heart failure)   . Anemia   . Thrombocytopenia   . Blood transfusion   . Renal failure, acute on chronic   . Chronic kidney disease     T, Th, Sat  . Lupus nephritis   . Renal insufficiency     FH:  Non-Contributory  History   Social History  . Marital Status: Married    Spouse Name: N/A    Number of Children: N/A  . Years of Education: N/A   Occupational History  . Not on file.   Social History Main Topics  . Smoking status: Never Smoker   . Smokeless tobacco: Never Used  . Alcohol Use: No  . Drug Use: No  . Sexually Active: Yes    Birth Control/ Protection: Condom   Other Topics Concern  . Not on file   Social History Narrative  . No narrative on file    No Known Allergies  Current Outpatient Prescriptions  Medication Sig Dispense Refill  . acetaminophen (TYLENOL) 325 MG tablet Take 650 mg by mouth as needed.      . calcium acetate (PHOSLO) 667 MG tablet Take 2,001 mg by mouth 3 (three) times daily with meals.       Marland Kitchen labetalol (NORMODYNE) 100 MG tablet Take 100 mg by mouth 2 (two) times daily.       . multivitamin (RENA-VIT) TABS tablet Take 1 tablet by mouth daily.       . ondansetron (ZOFRAN) 4 MG tablet Take 4 mg by mouth daily as needed. For nausea        ROS:  See HPI  PHYSICAL EXAM  Filed Vitals:   06/07/12 1310  BP: 134/90  Pulse: 74  Temp: 97.9 F (36.6 C)  Resp: 18    Gen:  Well developed well  nourished HEENT:  normocephalic Neck:  RIght IJ diatek cath in place Heart:  RRR Lungs:  Non-labored Abdomen:  Soft/NT/ND Extremities:  + thrill and bruit in RUA AVF Skin:  No obvious rashes Neuro:  In tact  Lab/X-ray:  Impression: This is a 37 y.o. female here for diatek catheter removal  Plan:  Removal of right diatek catheter  Leontine Locket, PA-C [facility] [phone] 06/07/2012 1:35 PM

## 2012-06-07 NOTE — Progress Notes (Signed)
[facility] Catheter Removal Procedure Note  Diagnosis: ESRD  Plan:  Remove right diatek catheter  Consent signed:  yes Time out completed:  yes Coumadin:  no PT/INR (if applicable):   Other labs:  Procedure: 1.  Sterile prepping and draping over catheter area 2. 6 ml 2% lidocaine plain instilled at removal site. 3.  right catheter removed in its entirety with cuff in tact. 4.  Complications:  none 5. Tip of catheter sent for culture:  no   Patient tolerated procedure well:  yes Pressure held, no bleeding noted, dressing applied Instructions given to the pt regarding wound care and bleeding.  Other:  Leontine Locket, PA-C 06/07/2012 1:36 PM

## 2012-06-07 NOTE — Progress Notes (Signed)
Right upper chest dressing clean, dry, intact.

## 2012-06-11 ENCOUNTER — Other Ambulatory Visit: Payer: Self-pay

## 2012-06-11 ENCOUNTER — Encounter (HOSPITAL_COMMUNITY): Payer: Self-pay | Admitting: Emergency Medicine

## 2012-06-11 ENCOUNTER — Emergency Department (HOSPITAL_COMMUNITY)
Admission: EM | Admit: 2012-06-11 | Discharge: 2012-06-11 | Disposition: A | Discharge status: Home / Self Care | Payer: Self-pay | Attending: Emergency Medicine | Admitting: Emergency Medicine

## 2012-06-11 ENCOUNTER — Emergency Department (HOSPITAL_COMMUNITY): Payer: Self-pay

## 2012-06-11 DIAGNOSIS — Z79899 Other long term (current) drug therapy: Secondary | ICD-10-CM | POA: Insufficient documentation

## 2012-06-11 DIAGNOSIS — I509 Heart failure, unspecified: Secondary | ICD-10-CM | POA: Insufficient documentation

## 2012-06-11 DIAGNOSIS — R0602 Shortness of breath: Secondary | ICD-10-CM | POA: Insufficient documentation

## 2012-06-11 DIAGNOSIS — Z87448 Personal history of other diseases of urinary system: Secondary | ICD-10-CM | POA: Insufficient documentation

## 2012-06-11 DIAGNOSIS — R071 Chest pain on breathing: Secondary | ICD-10-CM | POA: Insufficient documentation

## 2012-06-11 DIAGNOSIS — Z8541 Personal history of malignant neoplasm of cervix uteri: Secondary | ICD-10-CM | POA: Insufficient documentation

## 2012-06-11 DIAGNOSIS — N186 End stage renal disease: Secondary | ICD-10-CM | POA: Insufficient documentation

## 2012-06-11 DIAGNOSIS — Z8739 Personal history of other diseases of the musculoskeletal system and connective tissue: Secondary | ICD-10-CM | POA: Insufficient documentation

## 2012-06-11 DIAGNOSIS — Z862 Personal history of diseases of the blood and blood-forming organs and certain disorders involving the immune mechanism: Secondary | ICD-10-CM | POA: Insufficient documentation

## 2012-06-11 DIAGNOSIS — I120 Hypertensive chronic kidney disease with stage 5 chronic kidney disease or end stage renal disease: Secondary | ICD-10-CM | POA: Insufficient documentation

## 2012-06-11 DIAGNOSIS — R0789 Other chest pain: Secondary | ICD-10-CM

## 2012-06-11 DIAGNOSIS — Z8719 Personal history of other diseases of the digestive system: Secondary | ICD-10-CM | POA: Insufficient documentation

## 2012-06-11 LAB — COMPREHENSIVE METABOLIC PANEL
Albumin: 3.1 g/dL — ABNORMAL LOW (ref 3.5–5.2)
BUN: 31 mg/dL — ABNORMAL HIGH (ref 6–23)
Chloride: 94 mEq/L — ABNORMAL LOW (ref 96–112)
Creatinine, Ser: 8.31 mg/dL — ABNORMAL HIGH (ref 0.50–1.10)
Total Bilirubin: 0.3 mg/dL (ref 0.3–1.2)
Total Protein: 7.5 g/dL (ref 6.0–8.3)

## 2012-06-11 LAB — PRO B NATRIURETIC PEPTIDE: Pro B Natriuretic peptide (BNP): 9526 pg/mL — ABNORMAL HIGH (ref 0–125)

## 2012-06-11 LAB — CBC WITH DIFFERENTIAL/PLATELET
Basophils Relative: 0 % (ref 0–1)
Eosinophils Absolute: 0.1 10*3/uL (ref 0.0–0.7)
HCT: 32.9 % — ABNORMAL LOW (ref 36.0–46.0)
Hemoglobin: 10.5 g/dL — ABNORMAL LOW (ref 12.0–15.0)
MCH: 28.4 pg (ref 26.0–34.0)
MCHC: 31.9 g/dL (ref 30.0–36.0)
MCV: 88.9 fL (ref 78.0–100.0)
Monocytes Absolute: 0.6 10*3/uL (ref 0.1–1.0)
Monocytes Relative: 9 % (ref 3–12)

## 2012-06-11 LAB — TROPONIN I: Troponin I: <0.3 ng/mL (ref <0.30)

## 2012-06-11 MED ORDER — HYDROCODONE-ACETAMINOPHEN 5-325 MG PO TABS: 2.0000 | ORAL_TABLET | ORAL | Status: DC | PRN

## 2012-06-11 NOTE — ED Notes (Signed)
Patient C/O pain in her right back and center chest.  States that she is short of breath.  Reports having a tunneled right IJ catheter removed this week.  Catheter site is tender to touch. Patient states that she is short of breath because it hurts to take a breath. RUA AVF +/+.

## 2012-06-11 NOTE — ED Provider Notes (Signed)
History     CSN: [id number]  Arrival date & time 06/11/12  1233   First MD Initiated Contact with Patient 06/11/12 1241      Chief Complaint  Patient presents with  . Chest Pain    (Consider location/radiation/quality/duration/timing/severity/associated sxs/prior treatment) HPI Comments: Patient comes to the ER for evaluation of chest pain. Patient reports a sharp stabbing pain in the left upper chest which radiates into her back. Patient denies injury. She reports that the pain worsens with any movements of her torso. She has not had a cough. There is no fever.  Patient is a 37 y.o. female presenting with chest pain.  Chest Pain Primary symptoms include shortness of breath.     Past Medical History  Diagnosis Date  . Gallstone pancreatitis Feb 2011  . Cervix carcinoma in situ Mar 2011  . Proteinuria - cause not known   . Hypertension   . Lupus   . CHF (congestive heart failure)   . Anemia   . Thrombocytopenia   . Blood transfusion   . Renal failure, acute on chronic   . Chronic kidney disease     T, Th, Sat  . Lupus nephritis   . Renal insufficiency     Past Surgical History  Procedure Date  . Appendectomy   . Cervical cone biopsy   . Cesarean section   . Cholecystectomy   . Embolectomy 10/10/2011    Procedure: EMBOLECTOMY;  Surgeon: Angelia Mould, MD;  Location: [place];  Service: Vascular;  Laterality: Right;  . Av fistula placement   . Av fistula placement 07/18/2011    Procedure: ARTERIOVENOUS (AV) FISTULA CREATION;  Surgeon: Angelia Mould, MD;  Location: [place];  Service: Vascular;  Laterality: Right;  . Av fistula placement 01/23/12    Revision of Right AVF    Family History  Problem Relation Age of Onset  . Diabetes Mother   . Diabetes Brother   . Diabetes Brother   . Diabetes Maternal Grandmother   . Anesthesia problems Neg Hx     History  Substance Use Topics  . Smoking status: Never Smoker   . Smokeless tobacco: Never Used  .  Alcohol Use: No    OB History    Grav Para Term Preterm Abortions TAB SAB Ect Mult Living   4 3   1  1   3       Review of Systems  Respiratory: Positive for shortness of breath.   Cardiovascular: Positive for chest pain.  All other systems reviewed and are negative.    Allergies  Review of patient's allergies indicates no known allergies.  Home Medications   Current Outpatient Rx  Name  Route  Sig  Dispense  Refill  . CALCIUM ACETATE (PHOS BINDER) 667 MG PO TABS   Oral   Take 1,334 mg by mouth 3 (three) times daily with meals.            BP 107/77  Pulse 87  Temp 98.8 F (37.1 C) (Oral)  Resp 20  SpO2 99%  Physical Exam  Constitutional: She is oriented to person, place, and time. She appears well-developed and well-nourished. She appears distressed.       She has in her left upper chest with her hand, grimaces with movement  HENT:  Head: Normocephalic and atraumatic.  Right Ear: Hearing normal.  Nose: Nose normal.  Mouth/Throat: Oropharynx is clear and moist and mucous membranes are normal.  Eyes: Conjunctivae normal and EOM are normal.  Pupils are equal, round, and reactive to light.  Neck: Normal range of motion. Neck supple.  Cardiovascular: Normal rate, regular rhythm, S1 normal and S2 normal.  Exam reveals no gallop and no friction rub.   No murmur heard. Pulmonary/Chest: Effort normal and breath sounds normal. No respiratory distress. She exhibits tenderness.         Palpation over left upper chest does not reveal any crepitance or subcutaneous air, but does reproduce and worsen the patient's pain.  Abdominal: Soft. Normal appearance and bowel sounds are normal. There is no hepatosplenomegaly. There is no tenderness. There is no rebound, no guarding, no tenderness at McBurney's point and negative Murphy's sign. No hernia.  Musculoskeletal: Normal range of motion.  Neurological: She is alert and oriented to person, place, and time. She has normal strength.  No cranial nerve deficit or sensory deficit. Coordination normal. GCS eye subscore is 4. GCS verbal subscore is 5. GCS motor subscore is 6.  Skin: Skin is warm, dry and intact. No rash noted. No cyanosis.  Psychiatric: She has a normal mood and affect. Her speech is normal and behavior is normal. Thought content normal.    ED Course  Procedures (including critical care time)   Date: 06/11/2012  Rate: 99  Rhythm: normal sinus rhythm  QRS Axis: normal  Intervals: normal  ST/T Wave abnormalities: normal  Conduction Disutrbances:none  Narrative Interpretation:   Old EKG Reviewed: unchanged    Labs Reviewed  CBC WITH DIFFERENTIAL - Abnormal; Notable for the following:    RBC 3.70 (*)     Hemoglobin 10.5 (*)     HCT 32.9 (*)     All other components within normal limits  COMPREHENSIVE METABOLIC PANEL - Abnormal; Notable for the following:    Sodium 133 (*)     Potassium 3.1 (*)     Chloride 94 (*)     BUN 31 (*)     Creatinine, Ser 8.31 (*)     Albumin 3.1 (*)     GFR calc non Af Amer 6 (*)     GFR calc Af Amer 7 (*)     All other components within normal limits  PRO B NATRIURETIC PEPTIDE - Abnormal; Notable for the following:    Pro B Natriuretic peptide (BNP) 9526.0 (*)     All other components within normal limits  TROPONIN I   Dg Chest 2 View  06/11/2012  *RADIOLOGY REPORT*  Clinical Data: Chest pain.  CHEST - 2 VIEW  Comparison: 03/14/2012  Findings: Heart and mediastinal contours are within normal limits. No focal opacities or effusions.  No acute bony abnormality.  IMPRESSION: No active cardiopulmonary disease.   Original Report Authenticated By: Rolm Baptise, M.D.      Diagnosis: Chest wall pain    MDM  Patient presents to the ER for evaluation of left upper chest pain radiating through to her back. Pain is sharp and stabbing in nature. Patient appeared uncomfortable arrival. She was clearly having difficulty moving around the room because it hurts to bend and  twist. The area is very tender to the touch. This is very consistent with musculoskeletal chest discomfort. EKG was unremarkable and troponin is negative. Patient did have a chest x-ray performed. There is no evidence of pulmonary pathology. Pain is very atypical for PE, PERC is entirely negative. Patient will therefore be discharged with analgesia, followup with her doctor. Return to the ER if symptoms worsen.  **Please note that the nursing notes at time of  triage indicate cyanosis. I was present at the bedside at the time of triage and there was NO sign of cyanosis.        Orpah Greek, MD 06/11/12 [phone]

## 2012-06-11 NOTE — ED Notes (Signed)
Pt started having chest pain this a.m., she states it radiates to her back. Pt is SOB and having trouble Had dialysis Monday and Tuesday. Pt appears to be cyanotic

## 2012-06-11 NOTE — ED Notes (Signed)
Radiology tech reports pt told him that she hasn't had a period in a few months and only using condoms, no birth control pill.

## 2012-08-14 ENCOUNTER — Encounter (HOSPITAL_COMMUNITY): Payer: Self-pay | Admitting: *Deleted

## 2012-08-14 ENCOUNTER — Emergency Department (HOSPITAL_COMMUNITY): Payer: Medicaid Other

## 2012-08-14 ENCOUNTER — Emergency Department (HOSPITAL_COMMUNITY)
Admission: EM | Admit: 2012-08-14 | Discharge: 2012-08-14 | Disposition: A | Discharge status: Home / Self Care | Payer: Medicaid Other | Attending: Emergency Medicine | Admitting: Emergency Medicine

## 2012-08-14 DIAGNOSIS — Z8541 Personal history of malignant neoplasm of cervix uteri: Secondary | ICD-10-CM | POA: Insufficient documentation

## 2012-08-14 DIAGNOSIS — Z8719 Personal history of other diseases of the digestive system: Secondary | ICD-10-CM | POA: Insufficient documentation

## 2012-08-14 DIAGNOSIS — N184 Chronic kidney disease, stage 4 (severe): Secondary | ICD-10-CM | POA: Insufficient documentation

## 2012-08-14 DIAGNOSIS — R109 Unspecified abdominal pain: Secondary | ICD-10-CM | POA: Insufficient documentation

## 2012-08-14 DIAGNOSIS — I129 Hypertensive chronic kidney disease with stage 1 through stage 4 chronic kidney disease, or unspecified chronic kidney disease: Secondary | ICD-10-CM | POA: Insufficient documentation

## 2012-08-14 DIAGNOSIS — L03116 Cellulitis of left lower limb: Secondary | ICD-10-CM

## 2012-08-14 DIAGNOSIS — R5383 Other fatigue: Secondary | ICD-10-CM | POA: Insufficient documentation

## 2012-08-14 DIAGNOSIS — Z862 Personal history of diseases of the blood and blood-forming organs and certain disorders involving the immune mechanism: Secondary | ICD-10-CM | POA: Insufficient documentation

## 2012-08-14 DIAGNOSIS — I509 Heart failure, unspecified: Secondary | ICD-10-CM | POA: Insufficient documentation

## 2012-08-14 DIAGNOSIS — Z992 Dependence on renal dialysis: Secondary | ICD-10-CM | POA: Insufficient documentation

## 2012-08-14 DIAGNOSIS — Z8739 Personal history of other diseases of the musculoskeletal system and connective tissue: Secondary | ICD-10-CM | POA: Insufficient documentation

## 2012-08-14 DIAGNOSIS — Z87448 Personal history of other diseases of urinary system: Secondary | ICD-10-CM | POA: Insufficient documentation

## 2012-08-14 DIAGNOSIS — L02619 Cutaneous abscess of unspecified foot: Secondary | ICD-10-CM | POA: Insufficient documentation

## 2012-08-14 DIAGNOSIS — R5381 Other malaise: Secondary | ICD-10-CM | POA: Insufficient documentation

## 2012-08-14 HISTORY — DX: Dependence on renal dialysis: Z99.2

## 2012-08-14 LAB — BASIC METABOLIC PANEL
CO2: 29 mEq/L (ref 19–32)
Chloride: 94 mEq/L — ABNORMAL LOW (ref 96–112)
Creatinine, Ser: 8.6 mg/dL — ABNORMAL HIGH (ref 0.50–1.10)
Glucose, Bld: 86 mg/dL (ref 70–99)
Sodium: 137 mEq/L (ref 135–145)

## 2012-08-14 LAB — CBC WITH DIFFERENTIAL/PLATELET
Eosinophils Absolute: 0.1 10*3/uL (ref 0.0–0.7)
Eosinophils Relative: 3 % (ref 0–5)
HCT: 37.5 % (ref 36.0–46.0)
Lymphocytes Relative: 29 % (ref 12–46)
Lymphs Abs: 1.4 10*3/uL (ref 0.7–4.0)
MCH: 29.3 pg (ref 26.0–34.0)
MCV: 90.1 fL (ref 78.0–100.0)
Monocytes Absolute: 0.4 10*3/uL (ref 0.1–1.0)
RBC: 4.16 MIL/uL (ref 3.87–5.11)
WBC: 4.7 10*3/uL (ref 4.0–10.5)

## 2012-08-14 MED ORDER — CLINDAMYCIN HCL 150 MG PO CAPS: ORAL_CAPSULE | ORAL | Status: DC

## 2012-08-14 MED ORDER — VANCOMYCIN HCL 1000 MG IV SOLR
750.0000 mg | Freq: Once | INTRAVENOUS | Status: AC
  Administered 2012-08-14: 750 mg via INTRAVENOUS
  Filled 2012-08-14: qty 750

## 2012-08-14 MED ORDER — HYDROCODONE-ACETAMINOPHEN 5-325 MG PO TABS: 1.0000 | ORAL_TABLET | ORAL | Status: DC | PRN

## 2012-08-14 MED ORDER — HYDROCODONE-ACETAMINOPHEN 5-325 MG PO TABS
2.0000 | ORAL_TABLET | Freq: Once | ORAL | Status: AC
  Administered 2012-08-14: 2 via ORAL
  Filled 2012-08-14: qty 2

## 2012-08-14 MED ORDER — ONDANSETRON HCL 4 MG PO TABS
4.0000 mg | ORAL_TABLET | Freq: Once | ORAL | Status: AC
  Administered 2012-08-14: 4 mg via ORAL
  Filled 2012-08-14: qty 1

## 2012-08-14 NOTE — ED Notes (Signed)
Pt states that the twisted left ankle while walking yesterday, c/o pain and swelling to left ankle and foot area, cms intact, ice pack applied.

## 2012-08-14 NOTE — ED Notes (Signed)
Doctor's request - rectal temperature done.

## 2012-08-14 NOTE — ED Provider Notes (Signed)
History     CSN: [id number]  Arrival date & time 08/14/12  [id number]   First MD Initiated Contact with Patient 08/14/12 [phone]      Chief Complaint  Patient presents with  . Ankle Pain    (Consider location/radiation/quality/duration/timing/severity/associated sxs/prior treatment) HPI Comments: Patient initially states that she thought she may have twisted her left ankle while walking on yesterday. However after further questioning she does not recall whether or not she injured her ankle or not. She now has pain when she takes a step off. 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 The patient states she has not had any problem with fever or chills recently. She has not taken anything for her foot problem.  The history is provided by the patient. A language interpreter was used.    Past Medical History  Diagnosis Date  . Gallstone pancreatitis Feb 2011  . Cervix carcinoma in situ Mar 2011  . Proteinuria - cause not known   . Hypertension   . Lupus   . CHF (congestive heart failure)   . Anemia   . Thrombocytopenia   . Blood transfusion   . Renal failure, acute on chronic   . Chronic kidney disease     T, Th, Sat  . Lupus nephritis   . Renal insufficiency   . Dialysis patient     tues, thursday, saturday.    Past Surgical History  Procedure Laterality Date  . Appendectomy    . Cervical cone biopsy    . Cesarean section    . Cholecystectomy    . Embolectomy  10/10/2011    Procedure: EMBOLECTOMY;  Surgeon: Angelia Mould, MD;  Location: [place];  Service: Vascular;  Laterality: Right;  . Av fistula placement    . Av fistula placement  07/18/2011    Procedure: ARTERIOVENOUS (AV) FISTULA CREATION;  Surgeon: Angelia Mould, MD;  Location: [place];  Service: Vascular;  Laterality: Right;  . Av fistula placement  01/23/12    Revision of Right AVF    Family History  Problem Relation Age of Onset  . Diabetes Mother   .  Diabetes Brother   . Diabetes Brother   . Diabetes Maternal Grandmother   . Anesthesia problems Neg Hx     History  Substance Use Topics  . Smoking status: Never Smoker   . Smokeless tobacco: Never Used  . Alcohol Use: No    OB History   Grav Para Term Preterm Abortions TAB SAB Ect Mult Living   4 3   1  1   3       Review of Systems  Constitutional: Positive for fatigue. Negative for chills and activity change.       All ROS Neg except as noted in HPI  HENT: Negative for nosebleeds and neck pain.   Eyes: Negative for photophobia and discharge.  Respiratory: Negative for cough, shortness of breath and wheezing.   Cardiovascular: Negative for chest pain and palpitations.  Gastrointestinal: Positive for abdominal pain. Negative for blood in stool.  Genitourinary: Negative for dysuria, frequency and hematuria.  Musculoskeletal: Positive for arthralgias. Negative for back pain.  Skin: Negative.   Neurological: Negative for dizziness, seizures and speech difficulty.  Psychiatric/Behavioral: Negative for hallucinations and confusion.    Allergies  Review of patient's allergies indicates no known allergies.  Home Medications   Current Outpatient Rx  Name  Route  Sig  Dispense  Refill  . calcium acetate (PHOSLO) 667 MG tablet   Oral   Take 1,334 mg by mouth 3 (three) times daily with meals.          Marland Kitchen HYDROcodone-acetaminophen (NORCO/VICODIN) 5-325 MG per tablet   Oral   Take 2 tablets by mouth every 4 (four) hours as needed for pain.   10 tablet   0     BP 157/97  Pulse 77  Temp(Src) 97.4 F (36.3 C) (Rectal)  Resp 16  SpO2 100%  LMP 07/24/2012  Physical Exam  Nursing note and vitals reviewed. Constitutional: She is oriented to person, place, and time. She appears well-developed and well-nourished.  Non-toxic appearance.  HENT:  Head: Normocephalic.  Right Ear: Tympanic membrane and external ear normal.  Left Ear: Tympanic membrane and external ear normal.   Eyes: EOM and lids are normal. Pupils are equal, round, and reactive to light.  Neck: Normal range of motion. Neck supple. Carotid bruit is not present.  Cardiovascular: Normal rate, regular rhythm, normal heart sounds, intact distal pulses and normal pulses.   Pulmonary/Chest: Breath sounds normal. No respiratory distress.  Abdominal: Soft. Bowel sounds are normal. There is no tenderness. There is no guarding.  Musculoskeletal: Normal range of motion.  There is increased redness of the dorsum of the foot from the metatarsal head up to the ankle. There is swelling of the left foot. There no lesions between the toes. There is no noted puncture wound of the plantar surface. The area of increased redness of the dorsum of the foot is tender to palpation. I do not see a broken skin area, however there is a healing scratch to the lateral surface of the upper ankle.  Lymphadenopathy:       Head (right side): No submandibular adenopathy present.       Head (left side): No submandibular adenopathy present.    She has no cervical adenopathy.  Neurological: She is alert and oriented to person, place, and time. She has normal strength. No cranial nerve deficit or sensory deficit.  Skin: Skin is warm and dry.  Psychiatric: She has a normal mood and affect. Her speech is normal.    ED Course  Procedures (including critical care time)  Labs Reviewed  BASIC METABOLIC PANEL - Abnormal; Notable for the following:    Chloride 94 (*)    BUN 39 (*)    Creatinine, Ser 8.60 (*)    GFR calc non Af Amer 5 (*)    GFR calc Af Amer 6 (*)    All other components within normal limits  CBC WITH DIFFERENTIAL - Abnormal; Notable for the following:    Platelets 134 (*)    All other components within normal limits  CULTURE, BLOOD (ROUTINE X 2)  CULTURE, BLOOD (ROUTINE X 2)   Dg Ankle Complete Left  08/14/2012  *RADIOLOGY REPORT*  Clinical Data: Left foot and ankle pain and swelling following a twisting injury.   LEFT ANKLE COMPLETE - 3+ VIEW  Comparison: None.  Findings: Diffuse soft tissue swelling, most pronounced laterally. No fracture, dislocation or effusion seen.  IMPRESSION: No fracture.   Original Report Authenticated By: Claudie Revering, M.D.    Dg Foot Complete Left  08/14/2012  *RADIOLOGY REPORT*  Clinical Data: Left foot and ankle pain and swelling following a twisting injury.  LEFT FOOT - COMPLETE 3+ VIEW  Comparison: Left ankle radiographs obtained at the same time.  Findings: Mild dorsal soft tissue swelling.  Posterior calcaneal bone island.  No fracture or dislocation.  IMPRESSION: No fracture.   Original Report Authenticated By: Claudie Revering, M.D.      No diagnosis found.    MDM  I have reviewed nursing notes, vital signs, and all appropriate lab and imaging results for this patient.  Patient initially states that she" may have" twisted the left ankle while walking on yesterday. The patient verbalized to me that she was not sure how she injured the left ankle. This information is obtained through an interpreter.  A rectal temp is well within normal limits at 97.3. The basic metabolic panel shows a chloride of 94 which is low. The blood urea nitrogen is elevated at 39, the creatinine is elevated at 8.6, the glomerular filtration rate is 6 which is low. (Patient is a dialysis patient) the complete blood count reveals a white blood cell count of 4700, the platelets are slightly low 134. The remainder of the complete blood count is well within normal limits. X-ray of the left foot and ankle show no fracture or dislocation. There is mild dorsal soft tissue swelling. No reported gas being present.   The examination is consistent with a cellulitis of the dorsal portion of the left foot and ankle. I have discussed with pharmacy the dosing for vancomycin. The patient was given vancomycin 750 mg IV piggyback.      Lenox Ahr, PA-C 08/14/12 1309

## 2012-08-15 NOTE — ED Provider Notes (Signed)
Medical screening examination/treatment/procedure(s) were performed by non-physician practitioner and as supervising physician I was immediately available for consultation/collaboration.  Nat Christen, MD 08/15/12 1520

## 2012-08-16 ENCOUNTER — Encounter (HOSPITAL_COMMUNITY): Payer: Self-pay

## 2012-08-16 ENCOUNTER — Emergency Department (HOSPITAL_COMMUNITY)
Admission: EM | Admit: 2012-08-16 | Discharge: 2012-08-16 | Disposition: A | Discharge status: Home / Self Care | Payer: Medicaid Other | Attending: Emergency Medicine | Admitting: Emergency Medicine

## 2012-08-16 DIAGNOSIS — I129 Hypertensive chronic kidney disease with stage 1 through stage 4 chronic kidney disease, or unspecified chronic kidney disease: Secondary | ICD-10-CM | POA: Insufficient documentation

## 2012-08-16 DIAGNOSIS — N058 Unspecified nephritic syndrome with other morphologic changes: Secondary | ICD-10-CM | POA: Insufficient documentation

## 2012-08-16 DIAGNOSIS — Z992 Dependence on renal dialysis: Secondary | ICD-10-CM | POA: Insufficient documentation

## 2012-08-16 DIAGNOSIS — Z8639 Personal history of other endocrine, nutritional and metabolic disease: Secondary | ICD-10-CM | POA: Insufficient documentation

## 2012-08-16 DIAGNOSIS — I509 Heart failure, unspecified: Secondary | ICD-10-CM | POA: Insufficient documentation

## 2012-08-16 DIAGNOSIS — L02419 Cutaneous abscess of limb, unspecified: Secondary | ICD-10-CM | POA: Insufficient documentation

## 2012-08-16 DIAGNOSIS — N189 Chronic kidney disease, unspecified: Secondary | ICD-10-CM | POA: Insufficient documentation

## 2012-08-16 DIAGNOSIS — Z5189 Encounter for other specified aftercare: Secondary | ICD-10-CM | POA: Insufficient documentation

## 2012-08-16 DIAGNOSIS — E875 Hyperkalemia: Secondary | ICD-10-CM | POA: Insufficient documentation

## 2012-08-16 DIAGNOSIS — Z8719 Personal history of other diseases of the digestive system: Secondary | ICD-10-CM | POA: Insufficient documentation

## 2012-08-16 DIAGNOSIS — L03116 Cellulitis of left lower limb: Secondary | ICD-10-CM

## 2012-08-16 DIAGNOSIS — Z8541 Personal history of malignant neoplasm of cervix uteri: Secondary | ICD-10-CM | POA: Insufficient documentation

## 2012-08-16 DIAGNOSIS — Z79899 Other long term (current) drug therapy: Secondary | ICD-10-CM | POA: Insufficient documentation

## 2012-08-16 DIAGNOSIS — Z87448 Personal history of other diseases of urinary system: Secondary | ICD-10-CM | POA: Insufficient documentation

## 2012-08-16 DIAGNOSIS — M329 Systemic lupus erythematosus, unspecified: Secondary | ICD-10-CM | POA: Insufficient documentation

## 2012-08-16 DIAGNOSIS — Z862 Personal history of diseases of the blood and blood-forming organs and certain disorders involving the immune mechanism: Secondary | ICD-10-CM | POA: Insufficient documentation

## 2012-08-16 LAB — BASIC METABOLIC PANEL
Chloride: 94 mEq/L — ABNORMAL LOW (ref 96–112)
Creatinine, Ser: 13.35 mg/dL — ABNORMAL HIGH (ref 0.50–1.10)
GFR calc Af Amer: 4 mL/min — ABNORMAL LOW (ref >90)
Potassium: 5.8 mEq/L — ABNORMAL HIGH (ref 3.5–5.1)
Sodium: 138 mEq/L (ref 135–145)

## 2012-08-16 LAB — CBC WITH DIFFERENTIAL/PLATELET
Basophils Absolute: 0 10*3/uL (ref 0.0–0.1)
Basophils Relative: 0 % (ref 0–1)
MCHC: 32.8 g/dL (ref 30.0–36.0)
Monocytes Absolute: 0.2 10*3/uL (ref 0.1–1.0)
Neutro Abs: 2.5 10*3/uL (ref 1.7–7.7)
Neutrophils Relative %: 63 % (ref 43–77)
Platelets: 147 10*3/uL — ABNORMAL LOW (ref 150–400)
RDW: 15.1 % (ref 11.5–15.5)
WBC: 4 10*3/uL (ref 4.0–10.5)

## 2012-08-16 MED ORDER — SODIUM POLYSTYRENE SULFONATE 15 GM/60ML PO SUSP
30.0000 g | Freq: Once | ORAL | Status: AC
  Administered 2012-08-16: 30 g via ORAL
  Filled 2012-08-16: qty 60

## 2012-08-16 MED ORDER — CEFTRIAXONE SODIUM 1 G IJ SOLR
1.0000 g | Freq: Once | INTRAMUSCULAR | Status: AC
  Administered 2012-08-16: 1 g via INTRAMUSCULAR
  Filled 2012-08-16: qty 10

## 2012-08-16 NOTE — ED Provider Notes (Signed)
Pt has ESRD and gets dialysis on T H Sat (today is Monday). Has been seen in the ED for cellulitis on her left foot and presents for recheck.  She indicates her pain was intialy in her foot but is now into her lower leg.  Pt has mild swelling/redness of the dorsum of her left foot with faint redness of the lower leg.  Have discussed with PA Idol to talk to her nephrologist about getting her antibiotics at dialysis.   Medical screening examination/treatment/procedure(s) were conducted as a shared visit with non-physician practitioner(s) and myself.  I personally evaluated the patient during the encounter  Rolland Porter, MD, Alanson Aly, MD 08/16/12 [phone]

## 2012-08-16 NOTE — ED Provider Notes (Signed)
See prior note   Janice Norrie, MD 08/16/12 1048

## 2012-08-16 NOTE — ED Notes (Signed)
Pt was seen in the ed on sat and told to return to the ed today for recheck of wound to left foot.

## 2012-08-16 NOTE — ED Provider Notes (Signed)
History     CSN: [id number]  Arrival date & time 08/16/12  0808   First MD Initiated Contact with Patient 08/16/12 0818      Chief Complaint  Patient presents with  . Wound Check    (Consider location/radiation/quality/duration/timing/severity/associated sxs/prior treatment) HPI Comments: Noah Rinier is a 37 y.o. Female presenting for a recheck of her foot infection from 2 days ago.  She was treated here 2 days ago for a left foot cellulitis,  Was given vancomycin 1 gram IV and started on clindamycin 150 mg PO, now day #2.  She has persistent redness and swelling left foot with now pain in her left lower anterior leg as well.  She denies fevers, chills, nausea or vomiting.  She is a dialysis patient,  T,Th, Sa with plans for dialysis tomorrow morning.  Her vanc dose 2 days ago was given after her dialysis that day.  She was told to return today for a recheck.  She has also used elevation and warm compresses.  Her pain is worse with weight bearing,  And presents with crutches.  She denies injury to the foot or ankle,  Although initially thought she maybe she twisted the ankle the day before her initial tx,  Her xrays completed that day were normal.     The history is provided by the patient.    Past Medical History  Diagnosis Date  . Gallstone pancreatitis Feb 2011  . Cervix carcinoma in situ Mar 2011  . Proteinuria - cause not known   . Hypertension   . Lupus   . CHF (congestive heart failure)   . Anemia   . Thrombocytopenia   . Blood transfusion   . Renal failure, acute on chronic   . Chronic kidney disease     T, Th, Sat  . Lupus nephritis   . Renal insufficiency   . Dialysis patient     tues, thursday, saturday.    Past Surgical History  Procedure Laterality Date  . Appendectomy    . Cervical cone biopsy    . Cesarean section    . Cholecystectomy    . Embolectomy  10/10/2011    Procedure: EMBOLECTOMY;  Surgeon: Angelia Mould, MD;  Location: [facility];  Service: Vascular;  Laterality: Right;  . Av fistula placement    . Av fistula placement  07/18/2011    Procedure: ARTERIOVENOUS (AV) FISTULA CREATION;  Surgeon: Angelia Mould, MD;  Location: [facility];  Service: Vascular;  Laterality: Right;  . Av fistula placement  01/23/12    Revision of Right AVF    Family History  Problem Relation Age of Onset  . Diabetes Mother   . Diabetes Brother   . Diabetes Brother   . Diabetes Maternal Grandmother   . Anesthesia problems Neg Hx     History  Substance Use Topics  . Smoking status: Never Smoker   . Smokeless tobacco: Never Used  . Alcohol Use: No    OB History   Grav Para Term Preterm Abortions TAB SAB Ect Mult Living   4 3   1  1   3       Review of Systems  Constitutional: Negative for fever and chills.  HENT: Negative for facial swelling.   Respiratory: Negative for shortness of breath and wheezing.   Skin: Positive for color change.  Neurological: Negative for numbness.    Allergies  Review of patient's allergies indicates no known allergies.  Home Medications   Current Outpatient  Rx  Name  Route  Sig  Dispense  Refill  . calcium acetate (PHOSLO) 667 MG tablet   Oral   Take 1,334 mg by mouth 3 (three) times daily with meals.          . clindamycin (CLEOCIN) 150 MG capsule      1 po tid with food   21 capsule   0   . HYDROcodone-acetaminophen (NORCO/VICODIN) 5-325 MG per tablet   Oral   Take 1 tablet by mouth every 4 (four) hours as needed for pain.   15 tablet   0     BP 151/104  Pulse 77  Temp(Src) 98.1 F (36.7 C) (Oral)  Resp 20  Ht 5\' 2"  (1.575 m)  Wt 152 lb (68.947 kg)  BMI 27.79 kg/m2  SpO2 100%  LMP 07/24/2012  Physical Exam  Constitutional: She appears well-developed and well-nourished.  HENT:  Head: Atraumatic.  Neck: Normal range of motion.  Cardiovascular:  Pulses equal bilaterally  Musculoskeletal: She exhibits edema and tenderness.  Neurological: She is alert. She has  normal strength. She displays normal reflexes. No sensory deficit.  Equal strength  Skin: Skin is warm and dry. There is erythema.  Mild edema and erythema dorsum of left foot which has spread outside of the marker area now including the lateral foot and ankle area.  There is mild redness of her lower anterior leg as well without red streaking.  No induration, no fluctuance, skin is intact without puncture, abrasion or other apparent trauma.  Psychiatric: She has a normal mood and affect.    ED Course  Procedures (including critical care time)  Labs Reviewed  CBC WITH DIFFERENTIAL - Abnormal; Notable for the following:    Hemoglobin 11.6 (*)    HCT 35.4 (*)    Platelets 147 (*)    All other components within normal limits  BASIC METABOLIC PANEL   Dg Ankle Complete Left  08/14/2012  *RADIOLOGY REPORT*  Clinical Data: Left foot and ankle pain and swelling following a twisting injury.  LEFT ANKLE COMPLETE - 3+ VIEW  Comparison: None.  Findings: Diffuse soft tissue swelling, most pronounced laterally. No fracture, dislocation or effusion seen.  IMPRESSION: No fracture.   Original Report Authenticated By: Claudie Revering, M.D.    Dg Foot Complete Left  08/14/2012  *RADIOLOGY REPORT*  Clinical Data: Left foot and ankle pain and swelling following a twisting injury.  LEFT FOOT - COMPLETE 3+ VIEW  Comparison: Left ankle radiographs obtained at the same time.  Findings: Mild dorsal soft tissue swelling.  Posterior calcaneal bone island.  No fracture or dislocation.  IMPRESSION: No fracture.   Original Report Authenticated By: Claudie Revering, M.D.      No diagnosis found.    MDM  9:12 AM Spoke with Dr. Florene Glen who agrees with continued antibiotics.  If lab work today is stable would give her Rocephin IM today.  He will see her at dialysis tomorrow for reevaluation of her foot and will add IV antibiotics with her dialysis.  Patient was given Rocephin 1 g IM today.  Also discussed with her that she  will be given additional IV antibiotics with her dialysis treatments, and Dr. Florene Glen will see her tomorrow during her dialysis session.  She was encouraged to continue taking her clindamycin, elevation and warm compresses to her foot may also be helpful.  She was given a dose of Kayexalate given her potassium level is 5.8 today.  The patient appears reasonably screened and/or stabilized  for discharge and I doubt any other medical condition or other [facility] requiring further screening, evaluation, or treatment in the ED at this time prior to discharge.        Evalee Jefferson, PA-C 08/16/12 1040

## 2012-08-19 LAB — CULTURE, BLOOD (ROUTINE X 2): Culture: NO GROWTH

## 2012-11-29 ENCOUNTER — Emergency Department (HOSPITAL_COMMUNITY)
Admission: EM | Admit: 2012-11-29 | Discharge: 2012-11-30 | Disposition: A | Discharge status: Home / Self Care | Payer: Medicaid Other | Attending: Emergency Medicine | Admitting: Emergency Medicine

## 2012-11-29 ENCOUNTER — Encounter (HOSPITAL_COMMUNITY): Payer: Self-pay | Admitting: *Deleted

## 2012-11-29 DIAGNOSIS — N186 End stage renal disease: Secondary | ICD-10-CM | POA: Insufficient documentation

## 2012-11-29 DIAGNOSIS — Z8541 Personal history of malignant neoplasm of cervix uteri: Secondary | ICD-10-CM | POA: Insufficient documentation

## 2012-11-29 DIAGNOSIS — Z87448 Personal history of other diseases of urinary system: Secondary | ICD-10-CM | POA: Insufficient documentation

## 2012-11-29 DIAGNOSIS — Z8719 Personal history of other diseases of the digestive system: Secondary | ICD-10-CM | POA: Insufficient documentation

## 2012-11-29 DIAGNOSIS — Z5189 Encounter for other specified aftercare: Secondary | ICD-10-CM | POA: Insufficient documentation

## 2012-11-29 DIAGNOSIS — I509 Heart failure, unspecified: Secondary | ICD-10-CM | POA: Insufficient documentation

## 2012-11-29 DIAGNOSIS — Z992 Dependence on renal dialysis: Secondary | ICD-10-CM | POA: Insufficient documentation

## 2012-11-29 DIAGNOSIS — R110 Nausea: Secondary | ICD-10-CM | POA: Insufficient documentation

## 2012-11-29 DIAGNOSIS — R109 Unspecified abdominal pain: Secondary | ICD-10-CM

## 2012-11-29 DIAGNOSIS — Z79899 Other long term (current) drug therapy: Secondary | ICD-10-CM | POA: Insufficient documentation

## 2012-11-29 DIAGNOSIS — Z872 Personal history of diseases of the skin and subcutaneous tissue: Secondary | ICD-10-CM | POA: Insufficient documentation

## 2012-11-29 DIAGNOSIS — Z862 Personal history of diseases of the blood and blood-forming organs and certain disorders involving the immune mechanism: Secondary | ICD-10-CM | POA: Insufficient documentation

## 2012-11-29 DIAGNOSIS — R509 Fever, unspecified: Secondary | ICD-10-CM | POA: Insufficient documentation

## 2012-11-29 DIAGNOSIS — I120 Hypertensive chronic kidney disease with stage 5 chronic kidney disease or end stage renal disease: Secondary | ICD-10-CM | POA: Insufficient documentation

## 2012-11-29 LAB — CBC WITH DIFFERENTIAL/PLATELET
Lymphocytes Relative: 18 % (ref 12–46)
Lymphs Abs: 0.7 10*3/uL (ref 0.7–4.0)
MCV: 88.2 fL (ref 78.0–100.0)
Neutrophils Relative %: 73 % (ref 43–77)
Platelets: 141 10*3/uL — ABNORMAL LOW (ref 150–400)
RBC: 3.13 MIL/uL — ABNORMAL LOW (ref 3.87–5.11)
WBC: 4 10*3/uL (ref 4.0–10.5)

## 2012-11-29 LAB — BASIC METABOLIC PANEL
CO2: 32 mEq/L (ref 19–32)
Calcium: 8.9 mg/dL (ref 8.4–10.5)
Potassium: 3.8 mEq/L (ref 3.5–5.1)
Sodium: 135 mEq/L (ref 135–145)

## 2012-11-29 NOTE — ED Notes (Addendum)
Fever, fatique,  Dialysis on Saturday.  Nausea

## 2012-11-30 ENCOUNTER — Emergency Department (HOSPITAL_COMMUNITY): Payer: Medicaid Other

## 2012-11-30 LAB — HEPATIC FUNCTION PANEL
ALT: 7 U/L (ref 0–35)
AST: 14 U/L (ref 0–37)
Total Protein: 7.1 g/dL (ref 6.0–8.3)

## 2012-11-30 LAB — LIPASE, BLOOD: Lipase: 85 U/L — ABNORMAL HIGH (ref 11–59)

## 2012-11-30 MED ORDER — ACETAMINOPHEN 500 MG PO TABS
1000.0000 mg | ORAL_TABLET | Freq: Once | ORAL | Status: AC
  Administered 2012-11-30: 1000 mg via ORAL
  Filled 2012-11-30: qty 2

## 2012-11-30 MED ORDER — ONDANSETRON HCL 4 MG/2ML IJ SOLN
4.0000 mg | Freq: Once | INTRAMUSCULAR | Status: AC
  Administered 2012-11-30: 4 mg via INTRAVENOUS
  Filled 2012-11-30: qty 2

## 2012-11-30 MED ORDER — ONDANSETRON 8 MG PO TBDP: 8.0000 mg | ORAL_TABLET | Freq: Three times a day (TID) | ORAL | Status: DC | PRN

## 2012-11-30 MED ORDER — MORPHINE SULFATE 4 MG/ML IJ SOLN
6.0000 mg | Freq: Once | INTRAMUSCULAR | Status: AC
  Administered 2012-11-30: 6 mg via INTRAVENOUS
  Filled 2012-11-30: qty 2

## 2012-11-30 NOTE — ED Provider Notes (Signed)
History    CSN: [id number] Arrival date & time 11/29/12  2124  First MD Initiated Contact with Patient 11/29/12 2310     Chief Complaint  Patient presents with  . Fever    HPI Patient is a 37 year old female with a history of lupus and end-stage renal disease.  She dialyzes Tuesday Thursday Saturday.  Last dialysis was Saturday.  She reports her the past 24 hours she's developed fever chills and generalized malaise.  She denies chest pain shortness of breath.  No productive cough.  She no longer makes urine.  She dialyzes through a left upper extremity fistula reports no difficulties or pain around her fistula.  She denies diarrhea but does report nausea without vomiting.  She reports mild upper abdominal pain.  The patient is status post cholecystectomy and appendectomy.  Her symptoms are mild to moderate in severity.  Nothing worsens or improves her symptoms.  She denies neck pain or neck stiffness.  Altered mental status.  No rash.   Past Medical History  Diagnosis Date  . Gallstone pancreatitis Feb 2011  . Cervix carcinoma in situ Mar 2011  . Proteinuria - cause not known   . Hypertension   . Lupus   . CHF (congestive heart failure)   . Anemia   . Thrombocytopenia   . Blood transfusion   . Dialysis patient     tues, thursday, saturday.  . Renal failure, acute on chronic   . Chronic kidney disease     T, Th, Sat  . Lupus nephritis   . Renal insufficiency    Past Surgical History  Procedure Laterality Date  . Appendectomy    . Cervical cone biopsy    . Cesarean section    . Cholecystectomy    . Embolectomy  10/10/2011    Procedure: EMBOLECTOMY;  Surgeon: Angelia Mould, MD;  Location: [facility];  Service: Vascular;  Laterality: Right;  . Av fistula placement    . Av fistula placement  07/18/2011    Procedure: ARTERIOVENOUS (AV) FISTULA CREATION;  Surgeon: Angelia Mould, MD;  Location: [facility];  Service: Vascular;  Laterality: Right;  . Av fistula placement   01/23/12    Revision of Right AVF   Family History  Problem Relation Age of Onset  . Diabetes Mother   . Diabetes Brother   . Diabetes Brother   . Diabetes Maternal Grandmother   . Anesthesia problems Neg Hx    History  Substance Use Topics  . Smoking status: Never Smoker   . Smokeless tobacco: Never Used  . Alcohol Use: No   OB History   Grav Para Term Preterm Abortions TAB SAB Ect Mult Living   4 3   1  1   3      Review of Systems  All other systems reviewed and are negative.    Allergies  Review of patient's allergies indicates no known allergies.  Home Medications   Current Outpatient Rx  Name  Route  Sig  Dispense  Refill  . acetaminophen (TYLENOL) 500 MG tablet   Oral   Take 1,000 mg by mouth every 6 (six) hours as needed for pain.         . Calcium Acetate, Phos Binder, (CALCIUM ACETATE PO)   Oral   Take 1 tablet by mouth 3 (three) times daily.         Marland Kitchen HYDROcodone-acetaminophen (NORCO/VICODIN) 5-325 MG per tablet   Oral   Take 1 tablet by mouth every  4 (four) hours as needed for pain.   15 tablet   0   . ondansetron (ZOFRAN ODT) 8 MG disintegrating tablet   Oral   Take 1 tablet (8 mg total) by mouth every 8 (eight) hours as needed for nausea.   10 tablet   0    BP 132/82  Pulse 82  Temp(Src) 98.7 F (37.1 C) (Oral)  Resp 20  Ht 5\' 6"  (1.676 m)  Wt 138 lb 14.2 oz (62.999 kg)  BMI 22.43 kg/m2  SpO2 95% Physical Exam  Nursing note and vitals reviewed. Constitutional: She is oriented to person, place, and time. She appears well-developed and well-nourished. No distress.  HENT:  Head: Normocephalic and atraumatic.  Eyes: EOM are normal.  Neck: Normal range of motion.  Cardiovascular: Normal rate, regular rhythm and normal heart sounds.   Pulmonary/Chest: Effort normal and breath sounds normal.  Abdominal: Soft. She exhibits no distension.  Mild upper abdominal tenderness without rebound.  No peritonitis on exam  Musculoskeletal: Normal  range of motion.  Neurological: She is alert and oriented to person, place, and time.  Skin: Skin is warm and dry.  Psychiatric: She has a normal mood and affect. Judgment normal.    ED Course  Procedures (including critical care time) Labs Reviewed  BASIC METABOLIC PANEL - Abnormal; Notable for the following:    Chloride 93 (*)    BUN 37 (*)    Creatinine, Ser 10.51 (*)    GFR calc non Af Amer 4 (*)    GFR calc Af Amer 5 (*)    All other components within normal limits  CBC WITH DIFFERENTIAL - Abnormal; Notable for the following:    RBC 3.13 (*)    Hemoglobin 8.9 (*)    HCT 27.6 (*)    Platelets 141 (*)    All other components within normal limits  LIPASE, BLOOD - Abnormal; Notable for the following:    Lipase 85 (*)    All other components within normal limits  HEPATIC FUNCTION PANEL - Abnormal; Notable for the following:    Albumin 3.2 (*)    All other components within normal limits  CULTURE, BLOOD (ROUTINE X 2)  CULTURE, BLOOD (ROUTINE X 2)  CBC WITH DIFFERENTIAL   Ct Abdomen Pelvis Wo Contrast  11/30/2012   *RADIOLOGY REPORT*  Clinical Data: Fever and weakness; abdominal pain and nausea.  CT ABDOMEN AND PELVIS WITHOUT CONTRAST  Technique:  Multidetector CT imaging of the abdomen and pelvis was performed following the standard protocol without intravenous contrast.  Comparison: CT of the abdomen and pelvis performed 12/17/2009  Findings: Minimal left basilar atelectasis is noted.  The liver is unremarkable in appearance.  The patient is status post cholecystectomy, with clips noted along the gallbladder fossa. The spleen is mildly enlarged, measuring 14.1 cm in length, with numerous calcified granulomata.  The pancreas and adrenal glands are unremarkable.  There is vague haziness of intra-abdominal fat, with more diffuse haziness in the subcutaneous fat, raising concern for minimal anasarca.  Bilateral renal atrophy is noted.  Mild nonspecific perinephric stranding is noted  bilaterally.  There is no evidence of hydronephrosis.  No renal or ureteral stones are seen.  The small bowel is unremarkable in appearance.  The stomach is within normal limits.  No acute vascular abnormalities are seen.  The appendix is normal in caliber and contains air, without evidence for appendicitis.  The colon is unremarkable in appearance.  The bladder is decompressed and not well assessed.  Soft tissue stranding about the prevesical space may reflect the more diffuse soft tissue edema.  A 3.2 cm right adnexal cyst is likely physiologic in nature.  The ovaries are otherwise unremarkable in appearance.  The uterus is grossly unremarkable.  A small amount of free fluid within the pelvis is likely physiologic in nature.  No inguinal lymphadenopathy is seen.  No acute osseous abnormalities are identified.  IMPRESSION:  1.  Vague haziness of the abdominal fat, and more diffuse haziness in the subcutaneous fat, raising concern for minimal anasarca. 2.  Bilateral renal atrophy noted. 3.  Mild splenomegaly. 4.  Likely physiologic 3.2 cm right adnexal cyst; small amount of free fluid within the pelvis is likely physiologic in nature.   Original Report Authenticated By: Santa Lighter, M.D.   I personally reviewed the imaging tests through PACS system I reviewed available ER/hospitalization records through the EMR   1. Fever   2. Abdominal pain   3. Nausea     MDM  At time of discharge the patient feels much better.  CT scan without acute pathology in her abdomen.  No obvious source for bacterial infection.  Blood cultures obtained and pending at this time.  She is a dialysis patient therefore she is at risk for bacteremia.  She is scheduled for dialysis later this morning.  Her last she follows up with dialysis.  Diminishes your antibiotics during dialysis.  Blood cultures pending.  Overall very well appearing.  No chest pain shortness of breath to suggest need for chest x-ray.  She does not make  urine.  Hoy Morn, MD 11/30/12 [phone]

## 2012-12-05 ENCOUNTER — Emergency Department (HOSPITAL_COMMUNITY)
Admission: EM | Admit: 2012-12-05 | Discharge: 2012-12-05 | Disposition: A | Discharge status: Home / Self Care | Payer: Self-pay | Attending: Emergency Medicine | Admitting: Emergency Medicine

## 2012-12-05 ENCOUNTER — Encounter (HOSPITAL_COMMUNITY): Payer: Self-pay | Admitting: Emergency Medicine

## 2012-12-05 ENCOUNTER — Emergency Department (HOSPITAL_COMMUNITY): Payer: Self-pay

## 2012-12-05 DIAGNOSIS — Z8742 Personal history of other diseases of the female genital tract: Secondary | ICD-10-CM | POA: Insufficient documentation

## 2012-12-05 DIAGNOSIS — R05 Cough: Secondary | ICD-10-CM | POA: Insufficient documentation

## 2012-12-05 DIAGNOSIS — M549 Dorsalgia, unspecified: Secondary | ICD-10-CM | POA: Insufficient documentation

## 2012-12-05 DIAGNOSIS — I120 Hypertensive chronic kidney disease with stage 5 chronic kidney disease or end stage renal disease: Secondary | ICD-10-CM | POA: Insufficient documentation

## 2012-12-05 DIAGNOSIS — Z8739 Personal history of other diseases of the musculoskeletal system and connective tissue: Secondary | ICD-10-CM | POA: Insufficient documentation

## 2012-12-05 DIAGNOSIS — Z79899 Other long term (current) drug therapy: Secondary | ICD-10-CM | POA: Insufficient documentation

## 2012-12-05 DIAGNOSIS — R059 Cough, unspecified: Secondary | ICD-10-CM | POA: Insufficient documentation

## 2012-12-05 DIAGNOSIS — J209 Acute bronchitis, unspecified: Secondary | ICD-10-CM | POA: Insufficient documentation

## 2012-12-05 DIAGNOSIS — M25519 Pain in unspecified shoulder: Secondary | ICD-10-CM | POA: Insufficient documentation

## 2012-12-05 DIAGNOSIS — Z8719 Personal history of other diseases of the digestive system: Secondary | ICD-10-CM | POA: Insufficient documentation

## 2012-12-05 DIAGNOSIS — Z992 Dependence on renal dialysis: Secondary | ICD-10-CM | POA: Insufficient documentation

## 2012-12-05 DIAGNOSIS — Z862 Personal history of diseases of the blood and blood-forming organs and certain disorders involving the immune mechanism: Secondary | ICD-10-CM | POA: Insufficient documentation

## 2012-12-05 DIAGNOSIS — R1110 Vomiting, unspecified: Secondary | ICD-10-CM | POA: Insufficient documentation

## 2012-12-05 DIAGNOSIS — N186 End stage renal disease: Secondary | ICD-10-CM | POA: Insufficient documentation

## 2012-12-05 DIAGNOSIS — Z87448 Personal history of other diseases of urinary system: Secondary | ICD-10-CM | POA: Insufficient documentation

## 2012-12-05 LAB — CULTURE, BLOOD (ROUTINE X 2)
Culture: NO GROWTH
Culture: NO GROWTH

## 2012-12-05 LAB — BASIC METABOLIC PANEL
Chloride: 98 mEq/L (ref 96–112)
GFR calc Af Amer: 9 mL/min — ABNORMAL LOW (ref >90)
Potassium: 3.2 mEq/L — ABNORMAL LOW (ref 3.5–5.1)
Sodium: 138 mEq/L (ref 135–145)

## 2012-12-05 LAB — CBC WITH DIFFERENTIAL/PLATELET
Basophils Absolute: 0 10*3/uL (ref 0.0–0.1)
Basophils Relative: 0 % (ref 0–1)
MCHC: 31.6 g/dL (ref 30.0–36.0)
Neutro Abs: 5 10*3/uL (ref 1.7–7.7)
Neutrophils Relative %: 84 % — ABNORMAL HIGH (ref 43–77)
RDW: 13.9 % (ref 11.5–15.5)
WBC: 5.9 10*3/uL (ref 4.0–10.5)

## 2012-12-05 LAB — HEPATIC FUNCTION PANEL
ALT: 11 U/L (ref 0–35)
AST: 23 U/L (ref 0–37)
Bilirubin, Direct: 0.2 mg/dL (ref 0.0–0.3)
Total Bilirubin: 0.8 mg/dL (ref 0.3–1.2)

## 2012-12-05 MED ORDER — ONDANSETRON HCL 4 MG/2ML IJ SOLN
4.0000 mg | Freq: Once | INTRAMUSCULAR | Status: AC
  Administered 2012-12-05: 4 mg via INTRAVENOUS
  Filled 2012-12-05: qty 2

## 2012-12-05 MED ORDER — PROMETHAZINE-CODEINE 6.25-10 MG/5ML PO SYRP: 5.0000 mL | ORAL_SOLUTION | ORAL | Status: DC | PRN

## 2012-12-05 MED ORDER — AMOXICILLIN-POT CLAVULANATE 875-125 MG PO TABS: 1.0000 | ORAL_TABLET | Freq: Two times a day (BID) | ORAL | Status: DC

## 2012-12-05 MED ORDER — AMOXICILLIN-POT CLAVULANATE 875-125 MG PO TABS
1.0000 | ORAL_TABLET | Freq: Once | ORAL | Status: AC
  Administered 2012-12-05: 1 via ORAL
  Filled 2012-12-05: qty 1

## 2012-12-05 MED ORDER — ALBUTEROL SULFATE HFA 108 (90 BASE) MCG/ACT IN AERS
2.0000 | INHALATION_SPRAY | RESPIRATORY_TRACT | Status: DC | PRN
  Administered 2012-12-05: 2 via RESPIRATORY_TRACT
  Filled 2012-12-05: qty 6.7

## 2012-12-05 MED ORDER — MORPHINE SULFATE 4 MG/ML IJ SOLN
4.0000 mg | Freq: Once | INTRAMUSCULAR | Status: AC
  Administered 2012-12-05: 4 mg via INTRAVENOUS
  Filled 2012-12-05: qty 1

## 2012-12-05 MED ORDER — HYDROCODONE-ACETAMINOPHEN 5-325 MG PO TABS
1.0000 | ORAL_TABLET | Freq: Once | ORAL | Status: AC
  Administered 2012-12-05: 1 via ORAL
  Filled 2012-12-05: qty 1

## 2012-12-05 MED ORDER — ACETAMINOPHEN 500 MG PO TABS
1000.0000 mg | ORAL_TABLET | Freq: Once | ORAL | Status: AC
  Administered 2012-12-05: 1000 mg via ORAL
  Filled 2012-12-05: qty 2

## 2012-12-05 MED ORDER — SODIUM CHLORIDE 0.9 % IV BOLUS (SEPSIS): 500.0000 mL | Freq: Once | INTRAVENOUS | Status: DC

## 2012-12-05 MED ORDER — PREDNISONE 50 MG PO TABS
60.0000 mg | ORAL_TABLET | Freq: Once | ORAL | Status: AC
  Administered 2012-12-05: 60 mg via ORAL
  Filled 2012-12-05: qty 1

## 2012-12-05 NOTE — ED Notes (Addendum)
Translation line used. Pt c/o left upper back pain to shoulder blade started yesterday. Denies radiation but hurts when she breaths. Denies injury. Pt c/o coughing started yesterday after dialysis but none today. States it was green phlegm. Nausea. States has been urinating blood x 1 week. Thrill felt to r arm where dialysis cath placed. gen weakness noted.

## 2012-12-05 NOTE — ED Provider Notes (Signed)
History    CSN: [id number] Arrival date & time 12/05/12  0911  First MD Initiated Contact with Patient 12/05/12 [phone]     Chief Complaint  Patient presents with  . Back Pain  . Shortness of Breath  . Cough   (Consider location/radiation/quality/duration/timing/severity/associated sxs/prior Treatment) Patient is a 37 y.o. female presenting with back pain and cough. The history is provided by the patient. The history is limited by a language barrier. A language interpreter was used.  Back Pain Associated symptoms: no abdominal pain, no chest pain and no dysuria   Cough Cough characteristics:  Productive Severity:  Moderate Onset quality:  Gradual Duration:  1 day Timing:  Intermittent Progression:  Worsening Chronicity:  New Smoker: no   Context: upper respiratory infection and weather changes   Context comment:  Renal patient with hx of  CHF. Relieved by:  Nothing Worsened by:  Nothing tried Associated symptoms: shortness of breath   Associated symptoms: no chest pain, no eye discharge and no wheezing    Past Medical History  Diagnosis Date  . Gallstone pancreatitis Feb 2011  . Cervix carcinoma in situ Mar 2011  . Proteinuria - cause not known   . Hypertension   . Lupus   . CHF (congestive heart failure)   . Anemia   . Thrombocytopenia   . Blood transfusion   . Dialysis patient     tues, thursday, saturday.  . Renal failure, acute on chronic   . Chronic kidney disease     T, Th, Sat  . Lupus nephritis   . Renal insufficiency    Past Surgical History  Procedure Laterality Date  . Appendectomy    . Cervical cone biopsy    . Cesarean section    . Cholecystectomy    . Embolectomy  10/10/2011    Procedure: EMBOLECTOMY;  Surgeon: Angelia Mould, MD;  Location: [place];  Service: Vascular;  Laterality: Right;  . Av fistula placement    . Av fistula placement  07/18/2011    Procedure: ARTERIOVENOUS (AV) FISTULA CREATION;  Surgeon: Angelia Mould, MD;   Location: [place];  Service: Vascular;  Laterality: Right;  . Av fistula placement  01/23/12    Revision of Right AVF   Family History  Problem Relation Age of Onset  . Diabetes Mother   . Diabetes Brother   . Diabetes Brother   . Diabetes Maternal Grandmother   . Anesthesia problems Neg Hx    History  Substance Use Topics  . Smoking status: Never Smoker   . Smokeless tobacco: Never Used  . Alcohol Use: No   OB History   Grav Para Term Preterm Abortions TAB SAB Ect Mult Living   4 3   1  1   3      Review of Systems  Constitutional: Negative for activity change.       All ROS Neg except as noted in HPI  HENT: Negative for nosebleeds and neck pain.   Eyes: Negative for photophobia and discharge.  Respiratory: Positive for shortness of breath. Negative for cough and wheezing.   Cardiovascular: Negative for chest pain and palpitations.  Gastrointestinal: Positive for vomiting. Negative for abdominal pain and blood in stool.  Genitourinary: Negative for dysuria, frequency and hematuria.  Musculoskeletal: Positive for arthralgias. Negative for back pain.  Skin: Negative.   Neurological: Negative for dizziness, seizures and speech difficulty.  Psychiatric/Behavioral: Negative for hallucinations and confusion.    Allergies  Review of patient's allergies indicates  no known allergies.  Home Medications   Current Outpatient Rx  Name  Route  Sig  Dispense  Refill  . acetaminophen (TYLENOL) 500 MG tablet   Oral   Take 1,000 mg by mouth every 6 (six) hours as needed for pain.         . Calcium Acetate, Phos Binder, (CALCIUM ACETATE PO)   Oral   Take 1 tablet by mouth 3 (three) times daily.         Marland Kitchen HYDROcodone-acetaminophen (NORCO/VICODIN) 5-325 MG per tablet   Oral   Take 1 tablet by mouth every 4 (four) hours as needed for pain.   15 tablet   0   . ondansetron (ZOFRAN ODT) 8 MG disintegrating tablet   Oral   Take 1 tablet (8 mg total) by mouth every 8 (eight) hours  as needed for nausea.   10 tablet   0    BP 145/89  Pulse 85  Temp(Src) 100.6 F (38.1 C) (Oral)  Resp 20  Ht 5\' 6"  (1.676 m)  Wt 130 lb (58.968 kg)  BMI 20.99 kg/m2  SpO2 96% Physical Exam  Nursing note and vitals reviewed. Constitutional: She is oriented to person, place, and time. She appears well-developed and well-nourished.  Non-toxic appearance. She appears ill.  HENT:  Head: Normocephalic.  Right Ear: Tympanic membrane and external ear normal.  Left Ear: Tympanic membrane and external ear normal.  Eyes: EOM and lids are normal. Pupils are equal, round, and reactive to light.  Neck: Normal range of motion. Neck supple. Carotid bruit is not present.  Cardiovascular: Normal rate, regular rhythm, normal heart sounds, intact distal pulses and normal pulses.  PMI is not displaced.  Exam reveals no gallop.   No rub.   Pulmonary/Chest: Breath sounds normal. No respiratory distress.  Abdominal: Soft. Bowel sounds are normal. There is no tenderness. There is no guarding.    Musculoskeletal: Normal range of motion.  Lymphadenopathy:       Head (right side): No submandibular adenopathy present.       Head (left side): No submandibular adenopathy present.    She has no cervical adenopathy.  Neurological: She is alert and oriented to person, place, and time. She has normal strength. No cranial nerve deficit or sensory deficit.  Skin: Skin is warm and dry.  Psychiatric: She has a normal mood and affect. Her speech is normal.    ED Course  Procedures (including critical care time) Labs Reviewed  CULTURE, BLOOD (ROUTINE X 2)  CULTURE, BLOOD (ROUTINE X 2)  CBC WITH DIFFERENTIAL  BASIC METABOLIC PANEL  HEPATIC FUNCTION PANEL   No results found. No diagnosis found.  MDM  *I have reviewed nursing notes, vital signs, and all appropriate lab and imaging results for this patient.** Patient's history obtained through the translation line. The patient started having pain to the  upper back and involving the left shoulder blade on yesterday July 19. She has begun to cough up" green phlegm". She's had problems with nausea. She's had some lower abdomen area pain on the left. And she has been urinating blood for a week. It is of note that this patient is a dialysis patient.  Temperature was elevated at 100.6. A chest x-ray was obtained which showed bronchitic changes, but no pneumonia. A complete blood count revealed the white blood hematocrit is low at 26.9. Platelets are normal at 204,000. A basic metabolic panel shows potassium to be low at 3.2 the creatinine to be elevated at 6.10 (  dialysis patient) blood urea nitrogen normal at 16. Carbon dioxide is elevated at 33tHepatic function studies are within normal limits with exception of the albumin being 3.3. White blood cell count normal at 5.9, the hemoglobin is low at 8.5,.  Pt was seen with me by Dr Roderic Palau. Plans were made to treat pt with augmentin and norco for pain. Pt got little relief from the norco, and had mild vomiting. IV zofran given and morphine 4 mg given. Pt's temp down to 99 after tylenol and 526ml of IV fluid. Pt states she feels better at discharge. Directions given through language line. Pt states she feels much better.  Lenox Ahr, PA-C 12/11/12 1711

## 2012-12-05 NOTE — ED Notes (Signed)
EDPA in to evaluate

## 2012-12-10 LAB — CULTURE, BLOOD (ROUTINE X 2): Culture: NO GROWTH

## 2012-12-27 NOTE — ED Provider Notes (Signed)
Medical screening examination/treatment/procedure(s) were performed by non-physician practitioner and as supervising physician I was immediately available for consultation/collaboration.   Maudry Diego, MD 12/27/12 1451

## 2013-01-13 ENCOUNTER — Telehealth: Payer: Self-pay | Admitting: Gastroenterology

## 2013-01-13 ENCOUNTER — Ambulatory Visit: Payer: Self-pay | Admitting: Gastroenterology

## 2013-01-13 NOTE — Telephone Encounter (Signed)
Pt was a no show

## 2013-01-19 NOTE — Telephone Encounter (Signed)
Please send letter for f/u.  

## 2013-01-20 ENCOUNTER — Encounter: Payer: Self-pay | Admitting: Gastroenterology

## 2013-01-20 NOTE — Telephone Encounter (Signed)
Mailed letter °

## 2013-01-23 ENCOUNTER — Emergency Department (HOSPITAL_COMMUNITY)
Admission: EM | Admit: 2013-01-23 | Discharge: 2013-01-23 | Disposition: A | Discharge status: Home / Self Care | Payer: Medicaid Other | Attending: Emergency Medicine | Admitting: Emergency Medicine

## 2013-01-23 ENCOUNTER — Encounter (HOSPITAL_COMMUNITY): Payer: Self-pay

## 2013-01-23 ENCOUNTER — Emergency Department (HOSPITAL_COMMUNITY): Payer: Medicaid Other

## 2013-01-23 DIAGNOSIS — R109 Unspecified abdominal pain: Secondary | ICD-10-CM

## 2013-01-23 DIAGNOSIS — D72819 Decreased white blood cell count, unspecified: Secondary | ICD-10-CM | POA: Insufficient documentation

## 2013-01-23 DIAGNOSIS — Z992 Dependence on renal dialysis: Secondary | ICD-10-CM | POA: Insufficient documentation

## 2013-01-23 DIAGNOSIS — I129 Hypertensive chronic kidney disease with stage 1 through stage 4 chronic kidney disease, or unspecified chronic kidney disease: Secondary | ICD-10-CM | POA: Insufficient documentation

## 2013-01-23 DIAGNOSIS — R1013 Epigastric pain: Secondary | ICD-10-CM | POA: Insufficient documentation

## 2013-01-23 DIAGNOSIS — Z8739 Personal history of other diseases of the musculoskeletal system and connective tissue: Secondary | ICD-10-CM | POA: Insufficient documentation

## 2013-01-23 DIAGNOSIS — I313 Pericardial effusion (noninflammatory): Secondary | ICD-10-CM

## 2013-01-23 DIAGNOSIS — N189 Chronic kidney disease, unspecified: Secondary | ICD-10-CM | POA: Insufficient documentation

## 2013-01-23 DIAGNOSIS — I319 Disease of pericardium, unspecified: Secondary | ICD-10-CM | POA: Insufficient documentation

## 2013-01-23 DIAGNOSIS — Z9089 Acquired absence of other organs: Secondary | ICD-10-CM | POA: Insufficient documentation

## 2013-01-23 DIAGNOSIS — Z8541 Personal history of malignant neoplasm of cervix uteri: Secondary | ICD-10-CM | POA: Insufficient documentation

## 2013-01-23 DIAGNOSIS — R110 Nausea: Secondary | ICD-10-CM | POA: Insufficient documentation

## 2013-01-23 DIAGNOSIS — Z8719 Personal history of other diseases of the digestive system: Secondary | ICD-10-CM | POA: Insufficient documentation

## 2013-01-23 DIAGNOSIS — Z87448 Personal history of other diseases of urinary system: Secondary | ICD-10-CM | POA: Insufficient documentation

## 2013-01-23 DIAGNOSIS — Z862 Personal history of diseases of the blood and blood-forming organs and certain disorders involving the immune mechanism: Secondary | ICD-10-CM | POA: Insufficient documentation

## 2013-01-23 DIAGNOSIS — I509 Heart failure, unspecified: Secondary | ICD-10-CM | POA: Insufficient documentation

## 2013-01-23 LAB — COMPREHENSIVE METABOLIC PANEL
ALT: 15 U/L (ref 0–35)
BUN: 7 mg/dL (ref 6–23)
Calcium: 8.6 mg/dL (ref 8.4–10.5)
GFR calc Af Amer: 13 mL/min — ABNORMAL LOW (ref >90)
Glucose, Bld: 88 mg/dL (ref 70–99)
Sodium: 139 mEq/L (ref 135–145)
Total Protein: 6.7 g/dL (ref 6.0–8.3)

## 2013-01-23 LAB — TROPONIN I
Troponin I: <0.3 ng/mL (ref <0.30)
Troponin I: <0.3 ng/mL (ref <0.30)

## 2013-01-23 LAB — LIPASE, BLOOD: Lipase: 47 U/L (ref 11–59)

## 2013-01-23 LAB — CBC
Hemoglobin: 9.5 g/dL — ABNORMAL LOW (ref 12.0–15.0)
MCH: 28.4 pg (ref 26.0–34.0)
MCHC: 29.8 g/dL — ABNORMAL LOW (ref 30.0–36.0)

## 2013-01-23 MED ORDER — IOHEXOL 300 MG/ML  SOLN
100.0000 mL | Freq: Once | INTRAMUSCULAR | Status: AC | PRN
  Administered 2013-01-23: 100 mL via INTRAVENOUS

## 2013-01-23 MED ORDER — FAMOTIDINE 20 MG PO TABS
20.0000 mg | ORAL_TABLET | Freq: Once | ORAL | Status: AC
  Administered 2013-01-23: 20 mg via ORAL
  Filled 2013-01-23: qty 1

## 2013-01-23 MED ORDER — HYDROMORPHONE HCL PF 1 MG/ML IJ SOLN
1.0000 mg | Freq: Once | INTRAMUSCULAR | Status: AC
  Administered 2013-01-23: 1 mg via INTRAVENOUS
  Filled 2013-01-23: qty 1

## 2013-01-23 MED ORDER — IOHEXOL 300 MG/ML  SOLN
50.0000 mL | Freq: Once | INTRAMUSCULAR | Status: AC | PRN
  Administered 2013-01-23: 50 mL via ORAL

## 2013-01-23 MED ORDER — HYDROMORPHONE HCL PF 1 MG/ML IJ SOLN
INTRAMUSCULAR | Status: AC
  Filled 2013-01-23: qty 1

## 2013-01-23 MED ORDER — ONDANSETRON HCL 4 MG/2ML IJ SOLN
INTRAMUSCULAR | Status: AC
  Filled 2013-01-23: qty 2

## 2013-01-23 MED ORDER — ONDANSETRON HCL 4 MG/2ML IJ SOLN
4.0000 mg | Freq: Once | INTRAMUSCULAR | Status: AC
  Administered 2013-01-23: 4 mg via INTRAVENOUS

## 2013-01-23 MED ORDER — ONDANSETRON 4 MG PO TBDP
ORAL_TABLET | ORAL | Status: AC
  Administered 2013-01-23: 4 mg
  Filled 2013-01-23: qty 1

## 2013-01-23 MED ORDER — HYDROMORPHONE HCL PF 1 MG/ML IJ SOLN: 1.0000 mg | Freq: Once | INTRAMUSCULAR | Status: DC

## 2013-01-23 MED ORDER — ONDANSETRON HCL 4 MG/2ML IJ SOLN
4.0000 mg | Freq: Once | INTRAMUSCULAR | Status: AC
  Administered 2013-01-23: 4 mg via INTRAVENOUS
  Filled 2013-01-23: qty 2

## 2013-01-23 MED ORDER — FAMOTIDINE 20 MG PO TABS: 20.0000 mg | ORAL_TABLET | Freq: Two times a day (BID) | ORAL | Status: DC

## 2013-01-23 MED ORDER — GI COCKTAIL ~~LOC~~
30.0000 mL | Freq: Once | ORAL | Status: AC
  Administered 2013-01-23: 30 mL via ORAL
  Filled 2013-01-23: qty 30

## 2013-01-23 NOTE — ED Notes (Signed)
abd pain with vomiting for approx 6 hours

## 2013-01-23 NOTE — ED Notes (Signed)
Patient is a dialysis patient and unable to urinate

## 2013-01-23 NOTE — ED Provider Notes (Signed)
CSN: [id number]     Arrival date & time 01/23/13  0125 History   First MD Initiated Contact with Patient 01/23/13 [phone]     Chief Complaint  Patient presents with  . Abdominal Pain   (Consider location/radiation/quality/duration/timing/severity/associated sxs/prior Treatment) HPI History provided by patient. Dialysis patient presents with epigastric pain, sharp in quality, radiates to her back, onset tonight. She has associated nausea. No fevers. No chest pain or shortness of breath. No constipation or diarrhea. No blood in stools. No missed dialysis. Prior cholecystectomy. No trauma or known inciting factors. She denies any reflux or heartburn symptoms. No history of same. No known alleviating factors. Pain moderate to severe. Past Medical History  Diagnosis Date  . Gallstone pancreatitis Feb 2011  . Cervix carcinoma in situ Mar 2011  . Proteinuria - cause not known   . Hypertension   . Lupus   . CHF (congestive heart failure)   . Anemia   . Thrombocytopenia   . Blood transfusion   . Dialysis patient     tues, thursday, saturday.  . Renal failure, acute on chronic   . Chronic kidney disease     T, Th, Sat  . Lupus nephritis   . Renal insufficiency    Past Surgical History  Procedure Laterality Date  . Appendectomy    . Cervical cone biopsy    . Cesarean section    . Cholecystectomy    . Embolectomy  10/10/2011    Procedure: EMBOLECTOMY;  Surgeon: Angelia Mould, MD;  Location: [facility];  Service: Vascular;  Laterality: Right;  . Av fistula placement    . Av fistula placement  07/18/2011    Procedure: ARTERIOVENOUS (AV) FISTULA CREATION;  Surgeon: Angelia Mould, MD;  Location: [facility];  Service: Vascular;  Laterality: Right;  . Av fistula placement  01/23/12    Revision of Right AVF   Family History  Problem Relation Age of Onset  . Diabetes Mother   . Diabetes Brother   . Diabetes Brother   . Diabetes Maternal Grandmother   . Anesthesia problems Neg Hx     History  Substance Use Topics  . Smoking status: Never Smoker   . Smokeless tobacco: Never Used  . Alcohol Use: No   OB History   Grav Para Term Preterm Abortions TAB SAB Ect Mult Living   4 3   1  1   3      Review of Systems  Constitutional: Negative for fever and chills.  HENT: Negative for neck pain and neck stiffness.   Eyes: Negative for pain.  Respiratory: Negative for shortness of breath.   Cardiovascular: Negative for chest pain.  Gastrointestinal: Positive for nausea and abdominal pain.  Genitourinary: Negative for flank pain.  Musculoskeletal: Negative for back pain.  Skin: Negative for rash.  Neurological: Negative for headaches.  All other systems reviewed and are negative.    Allergies  Review of patient's allergies indicates no known allergies.  Home Medications   Current Outpatient Rx  Name  Route  Sig  Dispense  Refill  . acetaminophen (TYLENOL) 500 MG tablet   Oral   Take 1,000 mg by mouth every 6 (six) hours as needed for pain.         . calcium acetate (PHOSLO) 667 MG capsule   Oral   Take 667 mg by mouth 3 (three) times daily with meals.         . ondansetron (ZOFRAN ODT) 8 MG disintegrating tablet  Oral   Take 1 tablet (8 mg total) by mouth every 8 (eight) hours as needed for nausea.   10 tablet   0   . promethazine-codeine (PHENERGAN WITH CODEINE) 6.25-10 MG/5ML syrup   Oral   Take 5 mLs by mouth every 4 (four) hours as needed for cough.   150 mL   0   . amoxicillin-clavulanate (AUGMENTIN) 875-125 MG per tablet   Oral   Take 1 tablet by mouth every 12 (twelve) hours.   14 tablet   0    BP 137/92  Pulse 93  Temp(Src) 98.5 F (36.9 C) (Oral)  Resp 20  Ht 5\' 6"  (1.676 m)  Wt 134 lb (60.782 kg)  BMI 21.64 kg/m2  SpO2 98% Physical Exam  Nursing note and vitals reviewed. Constitutional: She is oriented to person, place, and time. She appears well-developed and well-nourished.  HENT:  Head: Normocephalic and atraumatic.   Eyes: EOM are normal. Pupils are equal, round, and reactive to light.  Neck: Neck supple.  Cardiovascular: Normal rate, normal heart sounds and intact distal pulses.   Pulmonary/Chest: Effort normal and breath sounds normal. No respiratory distress. She exhibits no tenderness.  Abdominal: Soft. Bowel sounds are normal. She exhibits no distension. There is no rebound and no guarding.  Tender epigastric region. No tenderness otherwise  Musculoskeletal: Normal range of motion. She exhibits no edema.  Neurological: She is alert and oriented to person, place, and time.  Skin: Skin is warm and dry.    ED Course  Procedures (including critical care time) Labs Review Results for orders placed during the hospital encounter of 01/23/13  CBC      Result Value Range   WBC 2.8 (*) 4.0 - 10.5 K/uL   RBC 3.35 (*) 3.87 - 5.11 MIL/uL   Hemoglobin 9.5 (*) 12.0 - 15.0 g/dL   HCT 31.9 (*) 36.0 - 46.0 %   MCV 95.2  78.0 - 100.0 fL   MCH 28.4  26.0 - 34.0 pg   MCHC 29.8 (*) 30.0 - 36.0 g/dL   RDW 17.6 (*) 11.5 - 15.5 %   Platelets 89 (*) 150 - 400 K/uL  COMPREHENSIVE METABOLIC PANEL      Result Value Range   Sodium 139  135 - 145 mEq/L   Potassium 3.5  3.5 - 5.1 mEq/L   Chloride 97  96 - 112 mEq/L   CO2 34 (*) 19 - 32 mEq/L   Glucose, Bld 88  70 - 99 mg/dL   BUN 7  6 - 23 mg/dL   Creatinine, Ser 4.68 (*) 0.50 - 1.10 mg/dL   Calcium 8.6  8.4 - 10.5 mg/dL   Total Protein 6.7  6.0 - 8.3 g/dL   Albumin 2.7 (*) 3.5 - 5.2 g/dL   AST 47 (*) 0 - 37 U/L   ALT 15  0 - 35 U/L   Alkaline Phosphatase 88  39 - 117 U/L   Total Bilirubin 0.7  0.3 - 1.2 mg/dL   GFR calc non Af Amer 11 (*) >90 mL/min   GFR calc Af Amer 13 (*) >90 mL/min  LIPASE, BLOOD      Result Value Range   Lipase 47  11 - 59 U/L  TROPONIN I      Result Value Range   Troponin I <0.30  <0.30 ng/mL   Ct Abdomen Pelvis W Contrast  01/23/2013   *RADIOLOGY REPORT*  Clinical Data: Upper abdominal pain.  History of gallstone pancreatitis  and cervical  cancer.  Multiple previous surgeries. Dialysis patient.  CT ABDOMEN AND PELVIS WITH CONTRAST  Technique:  Multidetector CT imaging of the abdomen and pelvis was performed following the standard protocol during bolus administration of intravenous contrast.  Contrast: 177mL OMNIPAQUE IOHEXOL 300 MG/ML  SOLN, 83mL OMNIPAQUE IOHEXOL 300 MG/ML  SOLN  Comparison: 11/30/2012  Findings: Dependent changes in the lung bases.  Pericardial effusion is new since previous study.  Surgical absence of the gallbladder.  Multiple calcified granulomas in the spleen.  Low attenuation lesion in the posterior right lobe of the liver likely representing hemangioma.  The pancreas, adrenal glands, abdominal aorta, inferior vena cava, and retroperitoneal lymph nodes are unremarkable.  The kidneys are atrophic with parenchymal contrast uptake demonstrated bilaterally.  No hydronephrosis.  There is diffuse edema throughout the subcutaneous fat and mesentery, demonstrating progression since previous study consistent with anasarca.  The stomach and small bowel are decompressed.  Stool filled colon without abnormal distension.  No free air or free fluid in the abdomen.  Pelvis:  The bladder is decompressed.  The uterus is not enlarged. There is a cyst in the left ovary measuring 3.3 cm, likely functional.  The right ovarian cyst seen on previous study has resolved.  There is free pelvic fluid, likely physiologic.  The appendix is surgically absent.  No evidence of diverticulitis.  No significant pelvic lymphadenopathy.  Normal alignment of the lumbar spine.  No destructive bone lesions appreciated.  IMPRESSION: Interval increase in subcutaneous and mesenteric edema consistent with progressing anasarca.  New pericardial effusion.  Free fluid in the pelvis is probably physiologic.  Physiologic cyst in the left ovary.  Bilateral renal atrophy.   Original Report Authenticated By: Lucienne Capers, M.D.        Date: 01/23/2013   Rate: 104  Rhythm: sinus tachycardia  QRS Axis: normal  Intervals: normal  ST/T Wave abnormalities: nonspecific ST changes  Conduction Disutrbances:none  Narrative Interpretation:   Old EKG Reviewed: Some T-wave flattening laterally not present on previous EKG 06/11/2012  IV Dilaudid. IV Zofran. 5:57 AM on recheck is feeling significantly better. Is no longer tender on exam.  GI cocktail and Pepcid provided. CT reviewed with patient as above. New pericardial effusion without any chest pain or shortness of breath. Repeat troponin negative.   Plan discharge home with close primary care followup, dialysis as scheduled, and condition improved. Prescription provided and patient agrees to all discharge and followup instructions   MDM  Epigastric pain and tenderness in a dialysis patient with previous appendectomy and cholecystectomy Diagnosis abdominal pain  Evaluated with EKG, labs and CT scan - no small bowel obstruction  Improved with IV narcotics Vital signs and nurses notes reviewed and considered   Teressa Lower, MD 01/23/13 [phone]

## 2013-01-23 NOTE — Discharge Instructions (Signed)
Dolor abdominal, versin ampliada (Abdominal Pain, Nonspecific) El anlisis podra no mostrar la razn exacta por la que tiene dolor abdominal. Debido a que hay muchas causas distintas de dolor abdominal, se podr necesitar otro control y ms anlisis. Es muy importante el seguimiento para observar los sntomas duraderos (persistentes) o los que [place]. Una causa posible de dolor abdominal en cualquier persona que an tiene su apndice es la apendicitis aguda. La apendicitis es a menudo difcil de diagnosticar. Los anlisis de [place], [place], [place] y tomografa computada no pueden descartar por completo la apendicitis u otra causas de dolor abdominal. A veces, slo los cambios que se producen a Designer, jewellery del Engineer, drilling si el dolor abdominal se debe al apendicitis o a otras causas. Otros problemas potenciales que pueden requerir [place] tambin pueden tomar algn [employer] ser evidentes. Debido a esto, es importante seguir todas las instrucciones de ms abajo. INSTRUCCIONES PARA EL CUIDADO DOMICILIARIO  Descanse todo lo que pueda.  No ingiera alimentos slidos hasta que el dolor desaparezca.  Cuando un adulto o un nio siente dolor: Puede beneficiarlo una dieta basada en agua, t liviano descafeinado, caldo o consom, gelatina, solucin de rehidratacin oral, helados de agua o trocitos de hielo.  Cuando el adulto o el nio no sienten ms dolor: Consuma una dieta liviana (tostadas secas, crackers, jugo de [place] o arroz blanco). Incorpore ms alimentos lentamente, siempre que esto no le cause ningn trastorno. No consuma productos lcteos (incluyendo queso y [place]) ni ingiera alimentos condimentados, grasos, fritos o con gran cantidad de [place].  No consuma alcohol, cafena ni cigarrillos.  Tome sus medicamentos regularmente, excepto que el profesional le indique lo contrario.  Utilice los medicamentos de venta libre o de prescripcin para Conservation officer, historic buildings, Health and safety inspector o la [place],  segn se lo indique el profesional que lo asiste.  Utilice los medicamentos de venta libre o de prescripcin para Conservation officer, historic buildings, Health and safety inspector o la [place], segn se lo indique el profesional que lo asiste. No administre aspirina a los nios. Si el mdico le ha dado fecha para una visita de control, es importante que concurra. No cumplir con este control puede dar como resultado que el dao, el dolor o la discapacidad sean permanentes (crnicos). Si tiene problemas para asistir al control, deber [employer] establecimiento para recibir asesoramiento.  SOLICITE ATENCIN MDICA DE INMEDIATO SI:  Usted o su nio han sufrido dolor por ms de 24 horas.  El dolor [place], cambia de Environmental consultant o se siente diferente.  Usted o su nio tienen una temperatura oral de ms de 38,9 C (102 F) y no puede ser controlada con medicamentos.  Su beb tiene ms de 3 meses y su temperatura rectal es de 102 F (38.9 C) o ms.  Su beb tiene 3 meses o menos y su temperatura rectal es de 100.4 F (38 C) o ms.  Usted o su hijo tienen escalofros.  Continan con vmitos y no pueden retener lquidos.  Observa sangre en el vmito o en la materia fecal.  Las heces son oscuras o [place].  Los movimientos intestinales son frecuentes.  Los movimientos intestinales se detienen (hay una obstruccin) o no pueden eliminarse los gases.  Siente dolor al [employer] o lo hace con frecuencia u observa sangre en la orina.  La piel y la zona blanca de los ojos [place] de color y se tornan amarillos.  Observa que el estmago se hincha o est ms grande.  Sienten mareos o [employer].  Sienten dolor en  el pecho o la espalda. EST SEGURO QUE:   Comprende las instrucciones para el alta mdica.  Controlar su enfermedad.  Solicitar atencin mdica de inmediato segn las indicaciones. Document Released: 08/12/2007 Document Revised: 07/28/2011 [facility] Patient Information 2014 [place], [place].

## 2013-02-03 IMAGING — NM NM PULMONARY VENT & PERF
2 series · 12 of 12 positions shown · non-contrast
Comparison: Chest radiographs same date.

CLINICAL DATA: Chest pain with shortness of breath and bilateral
foot swelling.  History of renal failure and systemic lupus
erythematosus.  Question pulmonary embolism.

NUCLEAR MEDICINE VENTILATION - PERFUSION LUNG SCAN
TECHNIQUE: Wash-in, equilibrium, and wash-out phase ventilation
images were obtained using Ve-YSS gas.  Perfusion images were
obtained in multiple projections after intravenous injection of Tc-
99m MAA.
Radiopharmaceuticals:  11.2 mCi Ve-YSS gas and 5.0 mCi Nc-MMm MAA.

[Series 1: lung vq · 3.20mm/px · 6 of 16 frames shown (1 of 2)]
[frame 2/16  full-range]
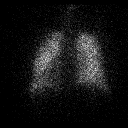
[frame 4/16  full-range]
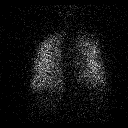
[frame 7/16  full-range]
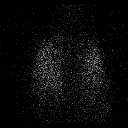
[frame 10/16]
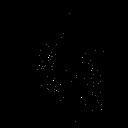
[frame 12/16]
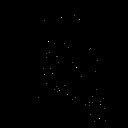
[frame 15/16]
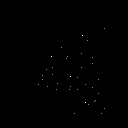

[Series 1: lung vq · 3.20mm/px · 6 of 16 frames shown (2 of 2)]
[frame 2/16  full-range]
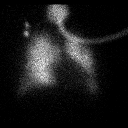
[frame 4/16  full-range]
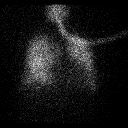
[frame 7/16  full-range]
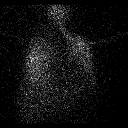
[frame 10/16  full-range]
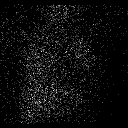
[frame 12/16  full-range]
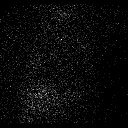
[frame 15/16  full-range]
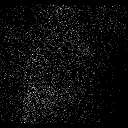

[12 of 12 positions shown; findings below may reference images not displayed]

FINDINGS: Ventilation images are within normal limits.  The
perfusion study is within normal limits.  There are no segmental
mismatches.
IMPRESSION: Very low probability for acute pulmonary embolism.

## 2013-03-03 ENCOUNTER — Encounter: Payer: Self-pay | Admitting: Gastroenterology

## 2013-03-03 ENCOUNTER — Telehealth: Payer: Self-pay | Admitting: Gastroenterology

## 2013-03-03 ENCOUNTER — Ambulatory Visit: Payer: Self-pay | Admitting: Gastroenterology

## 2013-03-03 NOTE — Telephone Encounter (Signed)
SECOND no-show appt.  Please let patient know about our office policy regarding no-shows.

## 2013-03-03 NOTE — Telephone Encounter (Signed)
I will need something in SPANISH.

## 2013-03-03 NOTE — Telephone Encounter (Signed)
Mailed letter °

## 2013-03-03 NOTE — Telephone Encounter (Signed)
Pt was a no show

## 2013-03-19 ENCOUNTER — Inpatient Hospital Stay (HOSPITAL_COMMUNITY)
Admission: EM | Admit: 2013-03-19 | Discharge: 2013-03-19 | DRG: 100 | Disposition: A | Discharge status: Other Acute Inpatient Hospital | Payer: Medicaid Other | Attending: Internal Medicine | Admitting: Internal Medicine

## 2013-03-19 ENCOUNTER — Encounter (HOSPITAL_COMMUNITY): Payer: Self-pay | Admitting: Emergency Medicine

## 2013-03-19 ENCOUNTER — Emergency Department (HOSPITAL_COMMUNITY): Payer: Medicaid Other

## 2013-03-19 DIAGNOSIS — M329 Systemic lupus erythematosus, unspecified: Secondary | ICD-10-CM | POA: Diagnosis present

## 2013-03-19 DIAGNOSIS — I509 Heart failure, unspecified: Secondary | ICD-10-CM | POA: Diagnosis present

## 2013-03-19 DIAGNOSIS — G039 Meningitis, unspecified: Secondary | ICD-10-CM | POA: Diagnosis present

## 2013-03-19 DIAGNOSIS — R569 Unspecified convulsions: Principal | ICD-10-CM

## 2013-03-19 DIAGNOSIS — G40909 Epilepsy, unspecified, not intractable, without status epilepticus: Secondary | ICD-10-CM | POA: Diagnosis present

## 2013-03-19 DIAGNOSIS — I10 Essential (primary) hypertension: Secondary | ICD-10-CM

## 2013-03-19 DIAGNOSIS — I160 Hypertensive urgency: Secondary | ICD-10-CM

## 2013-03-19 DIAGNOSIS — I120 Hypertensive chronic kidney disease with stage 5 chronic kidney disease or end stage renal disease: Secondary | ICD-10-CM | POA: Diagnosis present

## 2013-03-19 DIAGNOSIS — D649 Anemia, unspecified: Secondary | ICD-10-CM

## 2013-03-19 DIAGNOSIS — I3139 Other pericardial effusion (noninflammatory): Secondary | ICD-10-CM

## 2013-03-19 DIAGNOSIS — N186 End stage renal disease: Secondary | ICD-10-CM | POA: Diagnosis present

## 2013-03-19 DIAGNOSIS — I319 Disease of pericardium, unspecified: Secondary | ICD-10-CM | POA: Diagnosis present

## 2013-03-19 DIAGNOSIS — I313 Pericardial effusion (noninflammatory): Secondary | ICD-10-CM

## 2013-03-19 DIAGNOSIS — N179 Acute kidney failure, unspecified: Secondary | ICD-10-CM

## 2013-03-19 DIAGNOSIS — N2581 Secondary hyperparathyroidism of renal origin: Secondary | ICD-10-CM | POA: Diagnosis present

## 2013-03-19 DIAGNOSIS — I2789 Other specified pulmonary heart diseases: Secondary | ICD-10-CM | POA: Diagnosis present

## 2013-03-19 DIAGNOSIS — Z992 Dependence on renal dialysis: Secondary | ICD-10-CM

## 2013-03-19 DIAGNOSIS — Z833 Family history of diabetes mellitus: Secondary | ICD-10-CM

## 2013-03-19 HISTORY — DX: Epilepsy, unspecified, not intractable, without status epilepticus: G40.909

## 2013-03-19 LAB — PROTIME-INR: INR: 1.01 (ref 0.00–1.49)

## 2013-03-19 LAB — CSF CELL COUNT WITH DIFFERENTIAL
RBC Count, CSF: 55 /mm3 — ABNORMAL HIGH
Tube #: 4
WBC, CSF: <10 /mm3 — ABNORMAL HIGH (ref 0–5)
WBC, CSF: <10 /mm3 — ABNORMAL HIGH (ref 0–5)

## 2013-03-19 LAB — COMPREHENSIVE METABOLIC PANEL
ALT: 16 U/L (ref 0–35)
AST: 40 U/L — ABNORMAL HIGH (ref 0–37)
Albumin: 3 g/dL — ABNORMAL LOW (ref 3.5–5.2)
Alkaline Phosphatase: 107 U/L (ref 39–117)
CO2: 29 mEq/L (ref 19–32)
Chloride: 93 mEq/L — ABNORMAL LOW (ref 96–112)
GFR calc non Af Amer: 7 mL/min — ABNORMAL LOW (ref >90)
Potassium: 5 mEq/L (ref 3.5–5.1)
Sodium: 135 mEq/L (ref 135–145)
Total Bilirubin: 0.7 mg/dL (ref 0.3–1.2)

## 2013-03-19 LAB — GRAM STAIN: Gram Stain: NONE SEEN

## 2013-03-19 LAB — CBC
HCT: 39.2 % (ref 36.0–46.0)
Hemoglobin: 12 g/dL (ref 12.0–15.0)
MCH: 27.8 pg (ref 26.0–34.0)
MCHC: 30.6 g/dL (ref 30.0–36.0)
MCV: 90.7 fL (ref 78.0–100.0)
RDW: 16.4 % — ABNORMAL HIGH (ref 11.5–15.5)

## 2013-03-19 LAB — MRSA PCR SCREENING: MRSA by PCR: NEGATIVE

## 2013-03-19 LAB — PROTEIN AND GLUCOSE, CSF: Total  Protein, CSF: 62 mg/dL — ABNORMAL HIGH (ref 15–45)

## 2013-03-19 MED ORDER — LEVETIRACETAM 500 MG/5ML IV SOLN
INTRAVENOUS | Status: AC
  Filled 2013-03-19: qty 5

## 2013-03-19 MED ORDER — NEPRO/CARBSTEADY PO LIQD: 237.0000 mL | ORAL | Status: DC | PRN

## 2013-03-19 MED ORDER — LIDOCAINE-PRILOCAINE 2.5-2.5 % EX CREA
1.0000 "application " | TOPICAL_CREAM | CUTANEOUS | Status: DC | PRN
  Filled 2013-03-19: qty 5

## 2013-03-19 MED ORDER — LEVETIRACETAM 500 MG/5ML IV SOLN
250.0000 mg | INTRAVENOUS | Status: DC
  Administered 2013-03-19: 250 mg via INTRAVENOUS
  Filled 2013-03-19: qty 2.5

## 2013-03-19 MED ORDER — LORAZEPAM 2 MG/ML IJ SOLN: 2.0000 mg | INTRAMUSCULAR | Status: DC | PRN

## 2013-03-19 MED ORDER — PENTAFLUOROPROP-TETRAFLUOROETH EX AERO
1.0000 "application " | INHALATION_SPRAY | CUTANEOUS | Status: DC | PRN
  Filled 2013-03-19: qty 103.5

## 2013-03-19 MED ORDER — LORAZEPAM 2 MG/ML IJ SOLN
INTRAMUSCULAR | Status: AC
  Administered 2013-03-19: 1 mg
  Filled 2013-03-19: qty 1

## 2013-03-19 MED ORDER — METHYLPREDNISOLONE SODIUM SUCC 125 MG IJ SOLR: 125.0000 mg | INTRAMUSCULAR | Status: DC

## 2013-03-19 MED ORDER — HEPARIN SODIUM (PORCINE) 5000 UNIT/ML IJ SOLN
5000.0000 [IU] | Freq: Three times a day (TID) | INTRAMUSCULAR | Status: DC
  Administered 2013-03-19 (×2): 5000 [IU] via SUBCUTANEOUS
  Filled 2013-03-19 (×2): qty 1

## 2013-03-19 MED ORDER — DEXTROSE 5 % IV SOLN: 2.0000 g | Freq: Two times a day (BID) | INTRAVENOUS | Status: DC

## 2013-03-19 MED ORDER — MORPHINE SULFATE 2 MG/ML IJ SOLN
2.0000 mg | INTRAMUSCULAR | Status: DC | PRN
  Administered 2013-03-19 (×2): 2 mg via INTRAVENOUS
  Filled 2013-03-19 (×2): qty 1

## 2013-03-19 MED ORDER — SODIUM CHLORIDE 0.9 % IV SOLN: 100.0000 mL | INTRAVENOUS | Status: DC | PRN

## 2013-03-19 MED ORDER — ALTEPLASE 2 MG IJ SOLR: 2.0000 mg | Freq: Once | INTRAMUSCULAR | Status: DC | PRN

## 2013-03-19 MED ORDER — ONDANSETRON HCL 4 MG/2ML IJ SOLN
4.0000 mg | Freq: Four times a day (QID) | INTRAMUSCULAR | Status: DC | PRN
  Administered 2013-03-19: 4 mg via INTRAVENOUS
  Filled 2013-03-19: qty 2

## 2013-03-19 MED ORDER — DEXTROSE 5 % IV SOLN: 5.0000 mg/kg | INTRAVENOUS | Status: DC

## 2013-03-19 MED ORDER — DEXTROSE 5 % IV SOLN
2.0000 g | Freq: Two times a day (BID) | INTRAVENOUS | Status: DC
  Administered 2013-03-19: 2 g via INTRAVENOUS
  Filled 2013-03-19 (×4): qty 2

## 2013-03-19 MED ORDER — SODIUM CHLORIDE 0.9 % IV SOLN
1000.0000 mg | Freq: Once | INTRAVENOUS | Status: AC
  Administered 2013-03-19: 1000 mg via INTRAVENOUS
  Filled 2013-03-19: qty 10

## 2013-03-19 MED ORDER — OXYCODONE HCL 5 MG PO TABS: 5.0000 mg | ORAL_TABLET | ORAL | Status: DC | PRN

## 2013-03-19 MED ORDER — LIDOCAINE HCL (PF) 1 % IJ SOLN: 5.0000 mL | INTRAMUSCULAR | Status: DC | PRN

## 2013-03-19 MED ORDER — FENTANYL CITRATE 0.05 MG/ML IJ SOLN
INTRAMUSCULAR | Status: AC
  Administered 2013-03-19: 50 ug
  Filled 2013-03-19: qty 2

## 2013-03-19 MED ORDER — ONDANSETRON HCL 4 MG/2ML IJ SOLN: 4.0000 mg | Freq: Four times a day (QID) | INTRAMUSCULAR | Status: DC | PRN

## 2013-03-19 MED ORDER — DEXTROSE 5 % IV SOLN
2.0000 g | Freq: Once | INTRAVENOUS | Status: AC
  Administered 2013-03-19: 2 g via INTRAVENOUS
  Filled 2013-03-19: qty 2

## 2013-03-19 MED ORDER — SODIUM CHLORIDE 0.9 % IV SOLN: 500.0000 mg | INTRAVENOUS | Status: DC

## 2013-03-19 MED ORDER — SODIUM CHLORIDE 0.9 % IV SOLN
500.0000 mg | INTRAVENOUS | Status: DC
  Filled 2013-03-19 (×2): qty 5

## 2013-03-19 MED ORDER — VANCOMYCIN HCL IN DEXTROSE 1-5 GM/200ML-% IV SOLN
1000.0000 mg | Freq: Once | INTRAVENOUS | Status: AC
  Administered 2013-03-19: 1000 mg via INTRAVENOUS
  Filled 2013-03-19: qty 200

## 2013-03-19 MED ORDER — DEXTROSE 5 % IV SOLN
5.0000 mg/kg | INTRAVENOUS | Status: DC
  Filled 2013-03-19 (×2): qty 6.1

## 2013-03-19 MED ORDER — ONDANSETRON HCL 4 MG PO TABS: 4.0000 mg | ORAL_TABLET | Freq: Four times a day (QID) | ORAL | Status: DC | PRN

## 2013-03-19 MED ORDER — SODIUM CHLORIDE 0.9 % IJ SOLN: 3.0000 mL | Freq: Two times a day (BID) | INTRAMUSCULAR | Status: DC

## 2013-03-19 MED ORDER — HEPARIN SODIUM (PORCINE) 1000 UNIT/ML DIALYSIS
1000.0000 [IU] | INTRAMUSCULAR | Status: DC | PRN
  Filled 2013-03-19: qty 1

## 2013-03-19 MED ORDER — MORPHINE SULFATE 2 MG/ML IJ SOLN: 2.0000 mg | INTRAMUSCULAR | Status: DC | PRN

## 2013-03-19 MED ORDER — HEPARIN SODIUM (PORCINE) 5000 UNIT/ML IJ SOLN: 5000.0000 [IU] | Freq: Three times a day (TID) | INTRAMUSCULAR | Status: DC

## 2013-03-19 MED ORDER — LIDOCAINE-EPINEPHRINE (PF) 1 %-1:200000 IJ SOLN
INTRAMUSCULAR | Status: AC
  Administered 2013-03-19: 10:00:00
  Filled 2013-03-19: qty 10

## 2013-03-19 MED ORDER — METHYLPREDNISOLONE SODIUM SUCC 125 MG IJ SOLR
125.0000 mg | Freq: Once | INTRAMUSCULAR | Status: AC
  Administered 2013-03-19: 125 mg via INTRAVENOUS
  Filled 2013-03-19: qty 2

## 2013-03-19 MED ORDER — ONDANSETRON HCL 4 MG/2ML IJ SOLN
INTRAMUSCULAR | Status: AC
  Administered 2013-03-19: 4 mg
  Filled 2013-03-19: qty 2

## 2013-03-19 MED ORDER — DEXTROSE 5 % IV SOLN
600.0000 mg | Freq: Once | INTRAVENOUS | Status: AC
  Administered 2013-03-19: 600 mg via INTRAVENOUS
  Filled 2013-03-19: qty 12

## 2013-03-19 NOTE — ED Notes (Signed)
Pt had seizure like activity with MD and RN at bedside. Episode lasted about 30 seconds. Pt was given 1mg  ativan. Shortly after Dr. Jeanell Sparrow performed lumbar puncture. Consent was signed by pt's husband after daughter in-law translated. Pt tolerated procedure well.

## 2013-03-19 NOTE — Progress Notes (Signed)
MEDICATION RELATED CONSULT NOTE - INITIAL   Pharmacy Consult for Keppra IV Indication: seizure maintenance tx in ESRD patient  No Known Allergies  Patient Measurements: Height: 5\' 5"  (165.1 cm) Weight: 125 lb 3.5 oz (56.8 kg) IBW/kg (Calculated) : 57kg  Vital Signs: Temp: 98.3 F (36.8 C) (11/01 2200) Temp src: Oral (11/01 2200) BP: 126/70 mmHg (11/01 2200) Pulse Rate: 107 (11/01 2200) Intake/Output from previous day:   Intake/Output from this shift: Total I/O In: -  Out: 2000 [Other:2000]  Labs:  Recent Labs  03/19/13 0739  WBC 2.0*  HGB 12.0  HCT 39.2  PLT 123*  CREATININE 7.21*  ALBUMIN 3.0*  PROT 8.0  AST 40*  ALT 16  ALKPHOS 107  BILITOT 0.7   Estimated Creatinine Clearance: 9.8 ml/min (by C-G formula based on Cr of 7.21).   Microbiology: Recent Results (from the past 720 hour(s))  CULTURE, BLOOD (ROUTINE X 2)     Status: None   Collection Time    03/19/13  8:01 AM      Result Value Range Status   Specimen Description     Final   Value: BLOOD RIGHT ANTECUBITAL BOTTLES DRAWN AEROBIC AND ANAEROBIC   Special Requests 8CC   Final   Culture NO GROWTH <24 HRS   Final   Report Status PENDING   Incomplete  CULTURE, BLOOD (ROUTINE X 2)     Status: None   Collection Time    03/19/13  8:02 AM      Result Value Range Status   Specimen Description BLOOD BLOOD LEFT HAND BOTTLES DRAWN AEROBIC ONLY   Final   Special Requests 8CC   Final   Culture NO GROWTH <24 HRS   Final   Report Status PENDING   Incomplete  GRAM STAIN     Status: None   Collection Time    03/19/13 10:27 AM      Result Value Range Status   Specimen Description CSF   Final   Special Requests Immunocompromised   Final   Gram Stain     Final   Value: NO ORGANISMS SEEN     Gram Stain Report Called to,Read Back By and Verified With: TOLER M. AT 1203PM ON [id number] BY THOMPSON S.   Report Status 03/19/2013 FINAL   Final  MRSA PCR SCREENING     Status: None   Collection Time    03/19/13 12:03  PM      Result Value Range Status   MRSA by PCR NEGATIVE  NEGATIVE Final   Comment:            The GeneXpert MRSA Assay (FDA     approved for NASAL specimens     only), is one component of a     comprehensive MRSA colonization     surveillance program. It is not     intended to diagnose MRSA     infection nor to guide or     monitor treatment for     MRSA infections.    Medical History: Past Medical History  Diagnosis Date  . Gallstone pancreatitis Feb 2011  . Cervix carcinoma in situ Mar 2011  . Proteinuria - cause not known   . Hypertension   . Lupus   . CHF (congestive heart failure)   . Anemia   . Thrombocytopenia   . Blood transfusion   . Dialysis patient     tues, thursday, saturday.  . Renal failure, acute on chronic   . Chronic kidney  disease     T, Th, Sat  . Lupus nephritis   . Renal insufficiency     Medications:  Scheduled:  . [START ON 03/20/2013] acyclovir  5 mg/kg (Order-Specific) Intravenous Q24H  . cefTRIAXone (ROCEPHIN)  IV  2 g Intravenous Q12H  . heparin  5,000 Units Subcutaneous Q8H  . [START ON 03/20/2013] levETIRAcetam  500 mg Intravenous Q24H  . [START ON 03/20/2013] methylPREDNISolone (SOLU-MEDROL) injection  125 mg Intravenous Q24H  . sodium chloride  3 mL Intravenous Q12H   Infusions:   PRN: sodium chloride, sodium chloride, alteplase, feeding supplement (NEPRO CARB STEADY), heparin, lidocaine (PF), lidocaine-prilocaine, LORazepam, morphine injection, ondansetron (ZOFRAN) IV, ondansetron, oxyCODONE, pentafluoroprop-tetrafluoroeth  Assessment: Patient presently on Keppra 1gm IV x 1 loading dose given 11am (approx 12 hr ago).  Proper maintenance regimen of 500mg  IV q24h already ordered.  Dialysis done tonight approx 1800-2200.  Patient receiving dialysis on usual Tue-Thur-Sat schedule.  Recommended that patient recive a supplemental post-dialysis 250mg  dose to correct for hemodialysis removal.  Goal of Therapy and plan: Will order  post-dialysis dose of Keppra 250mg  IV tonigh, and then on regular dialysis days  Penni Homans 03/19/2013,10:26 PM

## 2013-03-19 NOTE — Progress Notes (Signed)
INITIAL NUTRITION ASSESSMENT  DOCUMENTATION CODES Per approved criteria  -Not Applicable   INTERVENTION: Follow for diet advancement; monitor for PO intake and need for ONS  NUTRITION DIAGNOSIS: Inadequate oral intake related to decreased responsiveness  as evidenced by NPO.   Goal: Pt will meet > 90% of estimated nutritional needs  Monitor:  Diet advancement and tolerance, labs, weight changes, skin assessments, changes in status  Reason for Assessment: MST=5  37 y.o. female  Admitting Dx: Seizure  ASSESSMENT: Pt admitted for AMS and seizures. Pt is an HD pt and receives treatment on Tuesdays, Thursdays, and Saturdays.  Wt hx reveals progressive weight loss over the past year. Noted 32# (19.6%) wt loss x 1 year, 7# (5.1%),21# (13.8%) wt loss x 9 months, and 7# (5.1%) wt loss x 3 months. Wt loss is tending toward significant, however, true wt loss likely being masked by CHF and RF.  Hx was very minimal due to pt lethargy and language barrier.  Minimal physical exam done due to lethargy and pt being covered with blankets at time of visit.  Nutrition Focused Physical Exam:  Subcutaneous Fat:  Orbital Region: WDL Upper Arm Region: not assessed Thoracic and Lumbar Region: not assessed  Muscle:  Temple Region: WDL Clavicle Bone Region: WDL Clavicle and Acromion Bone Region: not assessed Scapular Bone Region: not assessed Dorsal Hand: not assessed Patellar Region: not assessed Anterior Thigh Region: not assessed Posterior Calf Region: not assessed  Edema: not assessed  Unable to dx malnutrition at this time, however, pt is at risk for malnutrition due to wt loss, poor responsiveness, and multiple chronic conditions.   Height: Ht Readings from Last 1 Encounters:  03/19/13 5\' 5"  (1.651 m)    Weight: Wt Readings from Last 1 Encounters:  03/19/13 131 lb 2.8 oz (59.5 kg)    Ideal Body Weight: 125#  % Ideal Body Weight: 105%  Wt Readings from Last 10 Encounters:   03/19/13 131 lb 2.8 oz (59.5 kg)  01/23/13 134 lb (60.782 kg)  12/05/12 130 lb (58.968 kg)  11/29/12 138 lb 14.2 oz (62.999 kg)  08/16/12 152 lb (68.947 kg)  06/07/12 152 lb 1.9 oz (69 kg)  04/12/12 158 lb (71.668 kg)  04/12/12 158 lb (71.668 kg)  04/07/12 158 lb 14.4 oz (72.077 kg)  01/07/12 163 lb (73.936 kg)    Usual Body Weight: 158#  % Usual Body Weight: 83%  BMI:  Body mass index is 21.83 kg/(m^2). Meets criteria for normal weight.   Estimated Nutritional Needs: Kcal: 1700-1800 kcals daily Protein: 71-89 grams protein daily Fluid: 1-1.5 L fluid daily  Skin: WDL  Diet Order: NPO  EDUCATION NEEDS: -Education not appropriate at this time   Intake/Output Summary (Last 24 hours) at 03/19/13 1320 Last data filed at 03/19/13 1206  Gross per 24 hour  Intake    150 ml  Output      0 ml  Net    150 ml    Last BM: PTA  Labs:   Recent Labs Lab 03/19/13 0739  NA 135  K 5.0  CL 93*  CO2 29  BUN 34*  CREATININE 7.21*  CALCIUM 9.4  GLUCOSE 99    CBG (last 3)   Recent Labs  03/19/13 0758  GLUCAP 87    Scheduled Meds: . [START ON 03/20/2013] acyclovir  5 mg/kg (Order-Specific) Intravenous Q24H  . cefTRIAXone (ROCEPHIN)  IV  2 g Intravenous Q12H  . heparin  5,000 Units Subcutaneous Q8H  . [START ON 03/20/2013]  levETIRAcetam  500 mg Intravenous Q24H  . [START ON 03/20/2013] methylPREDNISolone (SOLU-MEDROL) injection  125 mg Intravenous Q24H  . sodium chloride  3 mL Intravenous Q12H    Continuous Infusions:   Past Medical History  Diagnosis Date  . Gallstone pancreatitis Feb 2011  . Cervix carcinoma in situ Mar 2011  . Proteinuria - cause not known   . Hypertension   . Lupus   . CHF (congestive heart failure)   . Anemia   . Thrombocytopenia   . Blood transfusion   . Dialysis patient     tues, thursday, saturday.  . Renal failure, acute on chronic   . Chronic kidney disease     T, Th, Sat  . Lupus nephritis   . Renal insufficiency      Past Surgical History  Procedure Laterality Date  . Appendectomy    . Cervical cone biopsy    . Cesarean section    . Cholecystectomy    . Embolectomy  10/10/2011    Procedure: EMBOLECTOMY;  Surgeon: Angelia Mould, MD;  Location: [place];  Service: Vascular;  Laterality: Right;  . Av fistula placement    . Av fistula placement  07/18/2011    Procedure: ARTERIOVENOUS (AV) FISTULA CREATION;  Surgeon: Angelia Mould, MD;  Location: [place];  Service: Vascular;  Laterality: Right;  . Av fistula placement  01/23/12    Revision of Right AVF    Lavontay Kirk A. Jimmye Norman, RD, LDN Pager: [phone]

## 2013-03-19 NOTE — ED Notes (Signed)
Pt comes via EMS after family called out stating the pt "isn't acting right". Family reports pt was dry heaving pta to EMS arrival. Pt was on her way to dialysis when EMS was called. Pt has hx of lupus. Pt does not speak english. Translator phone will be used for assessment.

## 2013-03-19 NOTE — Progress Notes (Addendum)
Patient remains lethargic, but will arouse. CSF studies thus far reviewed. No bacteria seen. <10 WBC, mildly elevated CSF protein. Pt thus far is continued on Keppra, 125mg  solumedrol, empiric vanc, rocephin, and acyclovir. As this facility does not have MRI, EEG, or 2D echo capability, and does not have Neurology, Cardiology, or Rheumatology coverage at this time, have discussed case with Dr. Seward Speck from [facility], who graciously accepted this patient in transfer. Pt will be dialyzed prior to transfer to [facility].

## 2013-03-19 NOTE — ED Provider Notes (Addendum)
CSN: [id number]     Arrival date & time 03/19/13  0715  Level 5 caveat patient with altered mental status History  This chart was scribed for Shaune Pollack, MD,  by Stacy Gardner, ED Scribe. The patient was seen in room APA06/APA06 and the patient's care was started at 7:10 AM.   None    Chief Complaint  Patient presents with  . Altered Mental Status  . Nausea   (Consider location/radiation/quality/duration/timing/severity/associated sxs/prior Treatment) Patient is a 37 y.o. female presenting with altered mental status. The history is provided by the spouse, a relative and medical records. The history is limited by a language barrier. A language interpreter was used.  Altered Mental Status  HPI Comments:: LEVEL 5 CAVEAT Maria Vazquez is a 37 y.o. female who presents to the Emergency Department via EMS for altered mental status while in the car parked outside of her dialysis center today PTA. She was on her way to dialysis this morning and her last treatment was Thursday. She usually receives dialysis every Tuesday, Thursday and Saturday. Pt's sister and husband states she had abnormal behavior and paralysis in her extremities after taking a pill prior to calling EMS. Per husband, she had a headache for the past 4 days and has tried generic Tylenol with no relief. Pt was heard vomiting in the bathroom 3-4 hours PTA, however,  is unable to describe the content of the vomit. Family denies any prior abdomen pain however upon arrival today pt has diffuse tenderness across abdomen. Pt's husband denies diarrhea and reports she was eating regularly last night. Per husband mentions the following associated symptoms of mild fever, cough, "her abdomen was hot", tremors in extremites and mild SOB.  He denies that she had a urine output today. Pt's last menses was last year.  Pt's husband is unsure of her current PCP but reports that she recieves her primary care at her Nephrologist. She is a pt at  [facility] in [place], [place]. Pt does not have diabetes. She has a medical hx of HTN, lupus, CHF, anemia, renal failure, thrombocytopaenia and chronic kidney disease. Pt has a surgical hx of appendectomy, cesarean section, and embolectomy.   Pt is unresponsive when name is called in both [place] and [place]. However, she does respond to indicate abdomen pain during examination.      Past Medical History  Diagnosis Date  . Gallstone pancreatitis Feb 2011  . Cervix carcinoma in situ Mar 2011  . Proteinuria - cause not known   . Hypertension   . Lupus   . CHF (congestive heart failure)   . Anemia   . Thrombocytopenia   . Blood transfusion   . Dialysis patient     tues, thursday, saturday.  . Renal failure, acute on chronic   . Chronic kidney disease     T, Th, Sat  . Lupus nephritis   . Renal insufficiency    Past Surgical History  Procedure Laterality Date  . Appendectomy    . Cervical cone biopsy    . Cesarean section    . Cholecystectomy    . Embolectomy  10/10/2011    Procedure: EMBOLECTOMY;  Surgeon: Angelia Mould, MD;  Location: [facility];  Service: Vascular;  Laterality: Right;  . Av fistula placement    . Av fistula placement  07/18/2011    Procedure: ARTERIOVENOUS (AV) FISTULA CREATION;  Surgeon: Angelia Mould, MD;  Location: [facility];  Service: Vascular;  Laterality: Right;  . Av fistula placement  01/23/12    Revision of Right AVF   Family History  Problem Relation Age of Onset  . Diabetes Mother   . Diabetes Brother   . Diabetes Brother   . Diabetes Maternal Grandmother   . Anesthesia problems Neg Hx    History  Substance Use Topics  . Smoking status: Never Smoker   . Smokeless tobacco: Never Used  . Alcohol Use: No   OB History   Grav Para Term Preterm Abortions TAB SAB Ect Mult Living   4 3   1  1   3      Review of Systems  Unable to perform ROS Constitutional: Positive for unexpected weight change.    Allergies  Review of patient's allergies  indicates no known allergies.  Home Medications   Current Outpatient Rx  Name  Route  Sig  Dispense  Refill  . acetaminophen (TYLENOL) 500 MG tablet   Oral   Take 1,000 mg by mouth every 6 (six) hours as needed for pain.         Marland Kitchen amoxicillin-clavulanate (AUGMENTIN) 875-125 MG per tablet   Oral   Take 1 tablet by mouth every 12 (twelve) hours.   14 tablet   0   . calcium acetate (PHOSLO) 667 MG capsule   Oral   Take 667 mg by mouth 3 (three) times daily with meals.         . famotidine (PEPCID) 20 MG tablet   Oral   Take 1 tablet (20 mg total) by mouth 2 (two) times daily.   30 tablet   0   . ondansetron (ZOFRAN ODT) 8 MG disintegrating tablet   Oral   Take 1 tablet (8 mg total) by mouth every 8 (eight) hours as needed for nausea.   10 tablet   0   . promethazine-codeine (PHENERGAN WITH CODEINE) 6.25-10 MG/5ML syrup   Oral   Take 5 mLs by mouth every 4 (four) hours as needed for cough.   150 mL   0    BP 153/108  Pulse 103  Temp(Src) 98.9 F (37.2 C) (Rectal)  Resp 26  SpO2 100% Physical Exam  Nursing note and vitals reviewed. Constitutional: She appears well-developed and well-nourished.  LEVEL 5 Caveat: Altered Mental Status   HENT:  Head: Normocephalic and atraumatic.  Right Ear: Hearing normal.  Left Ear: Hearing normal.  Nose: Nose normal.  Mouth/Throat: Oropharynx is clear and moist and mucous membranes are normal.  Eyes: Conjunctivae and EOM are normal. Pupils are equal, round, and reactive to light.  Neck: Normal range of motion. Neck supple.  Cardiovascular: Exam reveals no gallop and no friction rub.   No murmur heard. Tachycardia Hypertensive Fingerstick taken by EMS 130   Pulmonary/Chest: Effort normal and breath sounds normal. No respiratory distress. She exhibits no tenderness.  Airway intact  Respiratory breathing in mid 90's Tachypnea    Abdominal: Soft. Normal appearance and bowel sounds are normal. There is no  hepatosplenomegaly. There is tenderness. There is no rebound, no guarding, no tenderness at McBurney's point and negative Murphy's sign. No hernia.  Abdomen profusely painful  Musculoskeletal: Normal range of motion. She exhibits no edema and no tenderness.  2 AV fistula in right upper extremity   Neurological: She has normal strength.  Skin: Skin is warm, dry and intact. No rash noted. No cyanosis.    ED Course  LUMBAR PUNCTURE Date/Time: 03/19/2013 10:20 AM Performed by: Shaune Pollack Authorized by: Shaune Pollack Consent: Verbal consent  obtained. written consent obtained. Risks and benefits: risks, benefits and alternatives were discussed Consent given by: patient and spouse Patient identity confirmed: arm band Time out: Immediately prior to procedure a "time out" was called to verify the correct patient, procedure, equipment, support staff and site/side marked as required. Indications: evaluation for infection, evaluation for altered mental status and evaluation for subarachnoid hemorrhage Anesthesia: local infiltration Local anesthetic: lidocaine 1% with epinephrine Anesthetic total: 2 ml Patient sedated: no Preparation: Patient was prepped and draped in the usual sterile fashion. Lumbar space: L3-L4 interspace Patient's position: left lateral decubitus Needle gauge: 22 Needle type: diamond point Needle length: 3.5 in Number of attempts: 3 Fluid appearance: clear Tubes of fluid: 4 Total volume: 4 ml Post-procedure: site cleaned, pressure dressing applied and adhesive bandage applied Patient tolerance: Patient tolerated the procedure well with no immediate complications. Comments: Patient had seizure at beginning of procedure.    (including critical care time) DIAGNOSTIC STUDIES: Oxygen Saturation is 100% on room air, normal by my interpretation.    COORDINATION OF CARE: 7:31 AM Discussed course of care with pt's family which includes abdominal and head CT, x-Sylvestre Rathgeber of  chest, laboratory and chemical tests and CBC tests . Pt's family understands and agrees.  Labs Review Labs Reviewed - No data to display Imaging Review No results found.  EKG Interpretation   None      Results for orders placed during the hospital encounter of 03/19/13  CULTURE, BLOOD (ROUTINE X 2)      Result Value Range   Specimen Description       Value: BLOOD RIGHT ANTECUBITAL BOTTLES DRAWN AEROBIC AND ANAEROBIC   Special Requests 8CC     Culture NO GROWTH <24 HRS     Report Status PENDING    CULTURE, BLOOD (ROUTINE X 2)      Result Value Range   Specimen Description BLOOD BLOOD LEFT HAND BOTTLES DRAWN AEROBIC ONLY     Special Requests 8CC     Culture NO GROWTH <24 HRS     Report Status PENDING    COMPREHENSIVE METABOLIC PANEL      Result Value Range   Sodium 135  135 - 145 mEq/L   Potassium 5.0  3.5 - 5.1 mEq/L   Chloride 93 (*) 96 - 112 mEq/L   CO2 29  19 - 32 mEq/L   Glucose, Bld 99  70 - 99 mg/dL   BUN 34 (*) 6 - 23 mg/dL   Creatinine, Ser 7.21 (*) 0.50 - 1.10 mg/dL   Calcium 9.4  8.4 - 10.5 mg/dL   Total Protein 8.0  6.0 - 8.3 g/dL   Albumin 3.0 (*) 3.5 - 5.2 g/dL   AST 40 (*) 0 - 37 U/L   ALT 16  0 - 35 U/L   Alkaline Phosphatase 107  39 - 117 U/L   Total Bilirubin 0.7  0.3 - 1.2 mg/dL   GFR calc non Af Amer 7 (*) >90 mL/min   GFR calc Af Amer 8 (*) >90 mL/min  PROTIME-INR      Result Value Range   Prothrombin Time 13.1  11.6 - 15.2 seconds   INR 1.01  0.00 - 1.49  CBC      Result Value Range   WBC 2.0 (*) 4.0 - 10.5 K/uL   RBC 4.32  3.87 - 5.11 MIL/uL   Hemoglobin 12.0  12.0 - 15.0 g/dL   HCT 39.2  36.0 - 46.0 %   MCV 90.7  78.0 -  100.0 fL   MCH 27.8  26.0 - 34.0 pg   MCHC 30.6  30.0 - 36.0 g/dL   RDW 16.4 (*) 11.5 - 15.5 %   Platelets 123 (*) 150 - 400 K/uL  HCG, SERUM, QUALITATIVE      Result Value Range   Preg, Serum NEGATIVE  NEGATIVE  ETHANOL      Result Value Range   Alcohol, Ethyl (B) <11  0 - 11 mg/dL  LIPASE, BLOOD      Result  Value Range   Lipase 79 (*) 11 - 59 U/L  GLUCOSE, CAPILLARY      Result Value Range   Glucose-Capillary 87  70 - 99 mg/dL   Ct Abdomen Pelvis Wo Contrast  03/19/2013   CLINICAL DATA:  Initial encounter for acute mental status changes and abdominal pain. Current history of lupus and end-stage renal disease on hemodialysis.  EXAM: CT ABDOMEN AND PELVIS WITHOUT CONTRAST  TECHNIQUE: Multidetector CT imaging of the abdomen and pelvis was performed following the standard protocol without intravenous contrast.  COMPARISON:  CT 01/23/2013, 11/30/2012 and MRI abdomen 03/03/2011.  FINDINGS: Geographic areas of low attenuation throughout the liver; allowing for the unenhanced technique, no focal hepatic parenchymal mass. Numerous calcified granulomata throughout the otherwise normal-appearing spleen. Normal unenhanced appearance of the pancreas and adrenal glands. Gallbladder surgically absent. No biliary ductal dilation. Atrophic kidneys consistent with end stage renal disease. No visible aortoiliofemoral atherosclerosis. No significant lymphadenopathy.  Normal-appearing stomach, small bowel, and colon. Moderate amount of ascites dependently in the pelvis.  Uterus and ovaries unremarkable for the unenhanced technique. Urinary bladder completely decompressed. Phleboliths low in the left side of the pelvis.  Atelectasis in the visualized lower lobes. No visible pleural effusions. Large pericardial effusion. Moderate cardiac enlargement. Calcification involving the distal right coronary artery. Extensive diffuse body wall edema.  IMPRESSION: 1. Moderate ascites dependently in the pelvis. 2. Anasarca. 3. No acute abnormalities otherwise involving the abdomen or pelvis. 4. Large pericardial effusion. 5. Numerous splenic granulomata. 6. Focal areas of hepatic steatosis. No hepatic parenchymal masses. 7. Atrophic kidneys consistent with end stage renal disease.   Electronically Signed   By: Evangeline Dakin M.D.   On:  03/19/2013 09:01   Ct Head Wo Contrast (only If Suspected Head Trauma And/or Pt Is On Anticoagulant)  03/19/2013   CLINICAL DATA:  Initial encounter for acute mental status changes. Current history of lupus and end-stage renal disease on hemodialysis.  EXAM: CT HEAD WITHOUT CONTRAST  TECHNIQUE: Contiguous axial images were obtained from the base of the skull through the vertex without intravenous contrast.  COMPARISON:  Ventricular system normal in size and appearance for age. No mass lesion. No midline shift. No acute hemorrhage or hematoma. No extra-axial fluid collections. No evidence of acute infarction. Borderline to mild cortical atrophy for age.  No focal osseous abnormality involving the skull. Visualized paranasal sinuses, bilateral mastoid air cells, and bilateral middle ear cavities well-aerated.  FINDINGS: 1. No acute intracranial abnormality. 2. Borderline to mild cortical atrophy for age.   Electronically Signed   By: Evangeline Dakin M.D.   On: 03/19/2013 08:47   Dg Chest Port 1 View  03/19/2013   CLINICAL DATA:  Initial encounter for cough and acute mental status changes. Current history of lupus and end-stage renal disease on hemodialysis.  EXAM: PORTABLE CHEST - 1 VIEW  COMPARISON:  Two-view chest x-rays 12/05/2012 dating back to 07/18/2011.  FINDINGS: Cardiac silhouette massively enlarged, significantly increased in size since 12/05/2012. Mild  pulmonary venous hypertension without overt edema. Airspace consolidation with air bronchograms in the right lower lobe. Lungs otherwise clear. No visible pleural effusions.  IMPRESSION: 1. Massive cardiac enlargement. The fact that the cardiac silhouette is so much larger than on the examination 3-1/2 months ago raises the question of pericardial effusion. 2. Mild pulmonary venous hypertension without overt edema. 3. Right lower lobe pneumonia suspected.   Electronically Signed   By: Evangeline Dakin M.D.   On: 03/19/2013 08:43   9:11 AM Patient  more awake and conversant.  Complaining of epigastric pain.  Correltates with elevated lipase and history of gb pancreatitis.  MDM  1- altered mental status- appears resolving- No source found- consider medication reaction- by history patient took pain med just prior to event.  Source does not appear to be hypoxia, hypercarbia,circulatory (no hypotension apparent, regular rate and rhythm), consider secondary to neurologic status- patient had been complaining of headache, but no focal deficit, no abnormality of head ct, neck supple.  Glucose normal, body temperature normal, no s/s sepsis, however consider pneumonia given cxr suspect rll infiltrate. Patient does not make urine so no uds obtained, but no history of toxic exposure or ingestion except as above.    2- abdominal pain- probable gallstone pancreatitis  3- renal failure- due dialysis today, ow stable Potassium wnl. 4- pericardial effusion- appears increased. Patient with history of lupus.  5 rll infiltrate- Patient with some history of cough, but no fever or active coughing or apparent dyspnea here. - Will discuss antibiotic with admitting md. Vs hold antibiotics while ongoing assessment.  Patient now asking for pain medicine for headache and abdominal pain.    6- headache- patient continues to complain of headache on reexam.  Plan lp to assess for bleeding and infection and cover for meniningitis 7- new onset seizures Shaune Pollack, MD 03/19/13 [phone]  Patient had seizure during beginning of lp.  Family states this is what theymeant when they said patient had become paralyzed and stopped breathing earlier.  Patient given ativan.  Will add vancoymycin, keppra, and acyclovir coverage.    Discussed with Dr. Wyline Copas.  I personally performed the services described in this documentation, which was scribed in my presence. The recorded information has been reviewed and considered.  CRITICAL CARE Performed by: Shaune Pollack Total critical care  time: 90 Critical care time was exclusive of separately billable procedures and treating other patients. Critical care was necessary to treat or prevent imminent or life-threatening deterioration. Critical care was time spent personally by me on the following activities: development of treatment plan with patient and/or surrogate as well as nursing, discussions with consultants, evaluation of patient's response to treatment, examination of patient, obtaining history from patient or surrogate, ordering and performing treatments and interventions, ordering and review of laboratory studies, ordering and review of radiographic studies, pulse oximetry and re-evaluation of patient's condition.   Shaune Pollack, MD 03/19/13 Curtice Tiandra Swoveland, MD 03/19/13 1028

## 2013-03-19 NOTE — Progress Notes (Addendum)
ANTIBIOTIC CONSULT NOTE - INITIAL  Pharmacy Consult for Vancomycin, Ceftriaxone, Acyclovir  Indication: suspected meningitis  No Known Allergies  Patient Measurements:  Patient weight 60.8 kg (134 lb) per ED Adjusted Body Weight:   Vital Signs: Temp: 98.9 F (37.2 C) (11/01 0804) Temp src: Rectal (11/01 0804) BP: 164/113 mmHg (11/01 0855) Pulse Rate: 102 (11/01 0855) Intake/Output from previous day:   Intake/Output from this shift:    Labs:  Recent Labs  03/19/13 0739  WBC 2.0*  HGB 12.0  PLT 123*  CREATININE 7.21*   The CrCl is unknown because both a height and weight (above a minimum accepted value) are required for this calculation. No results found for this basename: VANCOTROUGH, Corlis Leak, VANCORANDOM, [place], GENTPEAK, GENTRANDOM, [place], TOBRAPEAK, TOBRARND, AMIKACINPEAK, AMIKACINTROU, AMIKACIN,  in the last 72 hours   Microbiology: Recent Results (from the past 720 hour(s))  CULTURE, BLOOD (ROUTINE X 2)     Status: None   Collection Time    03/19/13  8:01 AM      Result Value Range Status   Specimen Description     Final   Value: BLOOD RIGHT ANTECUBITAL BOTTLES DRAWN AEROBIC AND ANAEROBIC   Special Requests 8CC   Final   Culture NO GROWTH <24 HRS   Final   Report Status PENDING   Incomplete  CULTURE, BLOOD (ROUTINE X 2)     Status: None   Collection Time    03/19/13  8:02 AM      Result Value Range Status   Specimen Description BLOOD BLOOD LEFT HAND BOTTLES DRAWN AEROBIC ONLY   Final   Special Requests 8CC   Final   Culture NO GROWTH <24 HRS   Final   Report Status PENDING   Incomplete    Medical History: Past Medical History  Diagnosis Date  . Gallstone pancreatitis Feb 2011  . Cervix carcinoma in situ Mar 2011  . Proteinuria - cause not known   . Hypertension   . Lupus   . CHF (congestive heart failure)   . Anemia   . Thrombocytopenia   . Blood transfusion   . Dialysis patient     tues, thursday, saturday.  . Renal failure,  acute on chronic   . Chronic kidney disease     T, Th, Sat  . Lupus nephritis   . Renal insufficiency     Medications:  Scheduled:  . acyclovir  600 mg Intravenous Once  . cefTRIAXone (ROCEPHIN)  IV  2 g Intravenous Once  . cefTRIAXone (ROCEPHIN)  IV  2 g Intravenous Q12H  . heparin  5,000 Units Subcutaneous Q8H  . [START ON 03/20/2013] levETIRAcetam  500 mg Intravenous Q24H  . [START ON 03/20/2013] methylPREDNISolone (SOLU-MEDROL) injection  125 mg Intravenous Q24H  . sodium chloride  3 mL Intravenous Q12H  . vancomycin  1,000 mg Intravenous Once   Assessment: Presented to ED with intractable headaches associated with nausea/vomiting over past several days.  During LP, patient began seizing HD patient (TTS per admission note) Vancomycin, Ceftriaxone, Acyclovir, Keppra IV loading doses ordered in ED  Goal of Therapy:  Vancomycin trough level 15-20 mcg/ml  Plan:  Ceftriaxone 2 GM IV every 12 hours, next dose 11 PM today Acyclovir 300 mg (5 mg/kg) every 24 hours starting tomorrow morning Vancomycin 1 GM given in ED, pharmacy to schedule additional doses after HD Labs per protocol  Abner Greenspan, Paxten Appelt Bennett 03/19/2013,11:52 AM

## 2013-03-19 NOTE — Progress Notes (Signed)
Dr Theador Hawthorne called and made aware of Nephrology consult.

## 2013-03-19 NOTE — ED Notes (Signed)
Pt's family arrived to bedside. Family states pt began c/o headache "for the past few days". Family also states pt has been vomiting "or dry heaving". Husband states he was driving pt to dialysis center when "she stopped breathing". Husband states episodes lasted "maybe a couple of minutes". Pt not verbal responsive to staff or family.

## 2013-03-19 NOTE — H&P (Signed)
[facility] History and Physical  Maria Vazquez [id number] DOB: November 22, 1975 DOA: 03/19/2013  Referring physician: Emergency Department PCP: Estanislado Emms, MD  Specialists:   Chief Complaint: AMS  HPI: Maria Vazquez is a 37 y.o. female  With a hx of ESRD on TTS HD, lupus, CHF, and a hx of gallstone pancreatitis who initially presented to the ED with intractable headaches associated with nausea/vomiting over the past several days prior to admission. The patient was noted by family to have "stopped breathing" and was subsequently brought to the ED. In the Ed, the patient was found to be altered and confused. A CT head was unremarkable and a bedside LP was attempted. During the LP, the patient began seizing. Spinal fluid was clear and sent for studies. The hospitalist service was consulted for admission.  Review of Systems: Unable to obtain from pt given her level of alertness  Past Medical History  Diagnosis Date  . Gallstone pancreatitis Feb 2011  . Cervix carcinoma in situ Mar 2011  . Proteinuria - cause not known   . Hypertension   . Lupus   . CHF (congestive heart failure)   . Anemia   . Thrombocytopenia   . Blood transfusion   . Dialysis patient     tues, thursday, saturday.  . Renal failure, acute on chronic   . Chronic kidney disease     T, Th, Sat  . Lupus nephritis   . Renal insufficiency    Past Surgical History  Procedure Laterality Date  . Appendectomy    . Cervical cone biopsy    . Cesarean section    . Cholecystectomy    . Embolectomy  10/10/2011    Procedure: EMBOLECTOMY;  Surgeon: Angelia Mould, MD;  Location: [facility];  Service: Vascular;  Laterality: Right;  . Av fistula placement    . Av fistula placement  07/18/2011    Procedure: ARTERIOVENOUS (AV) FISTULA CREATION;  Surgeon: Angelia Mould, MD;  Location: [facility] [place];  Service: Vascular;  Laterality: Right;  . Av fistula placement  01/23/12    Revision of Right AVF    Social History:  reports that she has never smoked. She has never used smokeless tobacco. She reports that she does not drink alcohol or use illicit drugs.  where does patient live--home, ALF, SNF? and with whom if at home?  Can patient participate in ADLs?  No Known Allergies  Family History  Problem Relation Age of Onset  . Diabetes Mother   . Diabetes Brother   . Diabetes Brother   . Diabetes Maternal Grandmother   . Anesthesia problems Neg Hx     (be sure to complete)  Prior to Admission medications   Medication Sig Start Date End Date Taking? Authorizing Provider  labetalol (NORMODYNE) 100 MG tablet Take 100 mg by mouth 2 (two) times daily.   Yes Historical Provider, MD  lidocaine-prilocaine (EMLA) cream Apply 1 application topically as needed (before dialysis).   Yes Historical Provider, MD  pantoprazole (PROTONIX) 40 MG tablet Take 40 mg by mouth daily.   Yes Historical Provider, MD  promethazine (PHENERGAN) 25 MG tablet Take 25 mg by mouth 3 (three) times daily as needed for nausea.   Yes Historical Provider, MD   Physical Exam: Filed Vitals:   03/19/13 0804 03/19/13 0855  BP: 153/108 164/113  Pulse: 103 102  Temp: 98.9 F (37.2 C)   TempSrc: Rectal   Resp: 26 20  SpO2: 100% 100%     General:  Lethargic,  appears in nad  Eyes: PERRL B  ENT: membranes moist, dentition fair  Neck: trachea midline, neck supple  Cardiovascular: regular, s1, s2  Respiratory: normal resp effort, no wheezing  Abdomen: soft, nondistended  Skin: normal skin turgor, no abnormal skin lesions seen  Musculoskeletal: perfused, no clubbing   Psychiatric: unable to assess given level of alertness  Neurologic: unable to determine given level of alertness  Labs on Admission:  Basic Metabolic Panel:  Recent Labs Lab 03/19/13 0739  NA 135  K 5.0  CL 93*  CO2 29  GLUCOSE 99  BUN 34*  CREATININE 7.21*  CALCIUM 9.4   Liver Function Tests:  Recent Labs Lab 03/19/13 0739   AST 40*  ALT 16  ALKPHOS 107  BILITOT 0.7  PROT 8.0  ALBUMIN 3.0*    Recent Labs Lab 03/19/13 0739  LIPASE 79*   No results found for this basename: AMMONIA,  in the last 168 hours CBC:  Recent Labs Lab 03/19/13 0739  WBC 2.0*  HGB 12.0  HCT 39.2  MCV 90.7  PLT 123*   Cardiac Enzymes: No results found for this basename: CKTOTAL, CKMB, CKMBINDEX, TROPONINI,  in the last 168 hours  BNP (last 3 results)  Recent Labs  06/11/12 1251  PROBNP 9526.0*   CBG:  Recent Labs Lab 03/19/13 0758  GLUCAP 87    Radiological Exams on Admission: Ct Abdomen Pelvis Wo Contrast  03/19/2013   CLINICAL DATA:  Initial encounter for acute mental status changes and abdominal pain. Current history of lupus and end-stage renal disease on hemodialysis.  EXAM: CT ABDOMEN AND PELVIS WITHOUT CONTRAST  TECHNIQUE: Multidetector CT imaging of the abdomen and pelvis was performed following the standard protocol without intravenous contrast.  COMPARISON:  CT 01/23/2013, 11/30/2012 and MRI abdomen 03/03/2011.  FINDINGS: Geographic areas of low attenuation throughout the liver; allowing for the unenhanced technique, no focal hepatic parenchymal mass. Numerous calcified granulomata throughout the otherwise normal-appearing spleen. Normal unenhanced appearance of the pancreas and adrenal glands. Gallbladder surgically absent. No biliary ductal dilation. Atrophic kidneys consistent with end stage renal disease. No visible aortoiliofemoral atherosclerosis. No significant lymphadenopathy.  Normal-appearing stomach, small bowel, and colon. Moderate amount of ascites dependently in the pelvis.  Uterus and ovaries unremarkable for the unenhanced technique. Urinary bladder completely decompressed. Phleboliths low in the left side of the pelvis.  Atelectasis in the visualized lower lobes. No visible pleural effusions. Large pericardial effusion. Moderate cardiac enlargement. Calcification involving the distal right  coronary artery. Extensive diffuse body wall edema.  IMPRESSION: 1. Moderate ascites dependently in the pelvis. 2. Anasarca. 3. No acute abnormalities otherwise involving the abdomen or pelvis. 4. Large pericardial effusion. 5. Numerous splenic granulomata. 6. Focal areas of hepatic steatosis. No hepatic parenchymal masses. 7. Atrophic kidneys consistent with end stage renal disease.   Electronically Signed   By: Evangeline Dakin M.D.   On: 03/19/2013 09:01   Dg Chest 2 View  03/19/2013   CLINICAL DATA:  Subsequent evaluation of cough and cardiac silhouette enlargement. Current history of lupus and end-stage renal disease on hemodialysis.  EXAM: CHEST  2 VIEW  COMPARISON:  Portable chest x-ray earlier same date. Two-view chest x-ray 12/05/2012.  FINDINGS: Massive enlargement of the cardiac silhouette, shown on the CT abdomen earlier to represent a large pericardial effusion. Pulmonary vascularity normal without evidence of pulmonary edema. The airspace consolidation at the right lung base suspected on the portable examination earlier same day is no longer visible, indicating that it represented atelectasis.  Lungs clear. No pleural effusions.  IMPRESSION: 1. Opacities at the right lung base on the portable chest x-ray earlier same date have resolved, indicating atelectasis rather than pneumonia. 2. Massive enlargement of the cardiac silhouette due to a large pericardial effusion as noted on the earlier CT. 3. Mild pulmonary venous hypertension without overt pulmonary edema. 4. No acute cardiopulmonary disease.   Electronically Signed   By: Evangeline Dakin M.D.   On: 03/19/2013 09:31   Ct Head Wo Contrast (only If Suspected Head Trauma And/or Pt Is On Anticoagulant)  03/19/2013   CLINICAL DATA:  Initial encounter for acute mental status changes. Current history of lupus and end-stage renal disease on hemodialysis.  EXAM: CT HEAD WITHOUT CONTRAST  TECHNIQUE: Contiguous axial images were obtained from the base of  the skull through the vertex without intravenous contrast.  COMPARISON:  Ventricular system normal in size and appearance for age. No mass lesion. No midline shift. No acute hemorrhage or hematoma. No extra-axial fluid collections. No evidence of acute infarction. Borderline to mild cortical atrophy for age.  No focal osseous abnormality involving the skull. Visualized paranasal sinuses, bilateral mastoid air cells, and bilateral middle ear cavities well-aerated.  FINDINGS: 1. No acute intracranial abnormality. 2. Borderline to mild cortical atrophy for age.   Electronically Signed   By: Evangeline Dakin M.D.   On: 03/19/2013 08:47   Dg Chest Port 1 View  03/19/2013   CLINICAL DATA:  Initial encounter for cough and acute mental status changes. Current history of lupus and end-stage renal disease on hemodialysis.  EXAM: PORTABLE CHEST - 1 VIEW  COMPARISON:  Two-view chest x-rays 12/05/2012 dating back to 07/18/2011.  FINDINGS: Cardiac silhouette massively enlarged, significantly increased in size since 12/05/2012. Mild pulmonary venous hypertension without overt edema. Airspace consolidation with air bronchograms in the right lower lobe. Lungs otherwise clear. No visible pleural effusions.  IMPRESSION: 1. Massive cardiac enlargement. The fact that the cardiac silhouette is so much larger than on the examination 3-1/2 months ago raises the question of pericardial effusion. 2. Mild pulmonary venous hypertension without overt edema. 3. Right lower lobe pneumonia suspected.   Electronically Signed   By: Evangeline Dakin M.D.   On: 03/19/2013 08:43    Assessment/Plan Principal Problem:   Seizure Active Problems:   Lupus (systemic lupus erythematosus)   Meningitis   ESRD (end stage renal disease) on dialysis   Pericardial effusion   1. Seizure with question of acute meningitis 1. Afebrile 2. LP done in the ED, will await results of CSF analysis 3. Empiric vanc, rocephin, acyclovir 4. Cont solumedrol -  ?lupus cerebritis 5. Cont empiric keppra with prn ativan 6. Admit to stepdown 7. May consider Neuro consult 2. ESRD 1. On TTS HD 2. Will consult Nephrology 3. Euvolemic currently 3. Lupus 1. Will cont on solumedrol from now per above 4. Large pericardial effusion 1. Will order 2D echo 5. DVT prophylaxis 1. Heparin subQ  Code Status: Full (must indicate code status--if unknown or must be presumed, indicate so) Family Communication: Pt in room (indicate person spoken with, if applicable, with phone number if by telephone) Disposition Plan: Pending (indicate anticipated LOS)  Time spent: 14min  Susana Duell, [facility] Pager [phone]  If 7PM-7AM, please contact night-coverage [email or web address] Password [email or web address] 03/19/2013, 11:06 AM \

## 2013-03-19 NOTE — Progress Notes (Signed)
Lab called to report that pts spinal fluid form lumbar puncture came back negative for any organisms. MD paged and made aware.

## 2013-03-19 NOTE — Progress Notes (Signed)
Spoke with Davy Pique at [facility] to verify that they are able to view ct, and xrays.

## 2013-03-19 NOTE — Discharge Summary (Addendum)
Physician Discharge Summary  Maria Vazquez [id number] DOB: [birth year] DOA: 03/19/2013  PCP: Estanislado Emms, MD  Admit date: 03/19/2013 Discharge date: 03/19/2013  Time spent: 45 minutes  Recommendations for Outpatient Follow-up:  1. Transfer to [facility]  Discharge Diagnoses:  Principal Problem:   Seizure Active Problems:   Lupus (systemic lupus erythematosus)   Meningitis   ESRD (end stage renal disease) on dialysis   Pericardial effusion   Discharge Condition: Stable, Guarded  Diet recommendation: NPO currently  Filed Weights   03/19/13 1145  Weight: 59.5 kg (131 lb 2.8 oz)    History of present illness:  Maria Vazquez is a 37 y.o. female With a hx of ESRD on TTS HD, lupus, CHF, and a hx of gallstone pancreatitis who initially presented to the ED with intractable headaches associated with nausea/vomiting over the past several days prior to admission. The patient was noted by family to have "stopped breathing" and was subsequently brought to the ED. In the Ed, the patient was found to be altered and confused. A CT head was unremarkable and a bedside LP was attempted. During the LP, the patient began seizing. Spinal fluid was clear and sent for studies. Of note, the patient was found to have a massively enlarged heart on the CXR with a large pericardial effusion demonstrated on a CT abd. Given the above, the hospitalist service was consulted for admission.  Hospital Course:  The patient was admitted to the stepdown unit and remained lethargic, but arousable. Nephrology was consulted for the patient's normally scheduled dialysis. Ultimately, the patient's CSF studies thus far demonstrated a WBC of <10, with mildly elevated CSF protein into the 60's. CSF cultures, including HSV remain pending.The patient was continued on empiric high dose solumedrol, vanc, rocephin, and acyclovir. Since this facility does not have access to MRI, EEG, 2D echo and does not  have Neurology, Cardiology, or Rheumatology coverage at this time, the case was discussed with Dr. Seward Speck at [facility], who has kindly accepted this patient to the Nephrology service. Per Dr. Charlies Silvers request, the patient will be dialyzed prior to transfer to a higher level of care.  Consultations:  Nephrology  Discharge Exam: Filed Vitals:   03/19/13 1400 03/19/13 1430 03/19/13 1500 03/19/13 1600  BP: 148/107 140/97 141/96 134/84  Pulse: 116 103 106 103  Temp:    100 F (37.8 C)  TempSrc:    Axillary  Resp: 10 18 12 15   Height:      Weight:      SpO2: 98% 100% 100% 100%    General: Lethargic, but arousable, in nad Cardiovascular: regular, s1, s2 Respiratory: normal resp effort  Discharge Instructions     Medication List         dextrose 5 % SOLN 100 mL with acyclovir 50 MG/ML SOLN 305 mg  Inject 305 mg into the vein daily.  Start taking on:  03/20/2013     dextrose 5 % SOLN 50 mL with cefTRIAXone 2 G SOLR 2 g  Inject 2 g into the vein every 12 (twelve) hours.     heparin 5000 UNIT/ML injection  Inject 1 mL (5,000 Units total) into the skin every 8 (eight) hours.     labetalol 100 MG tablet  Commonly known as:  NORMODYNE  Take 100 mg by mouth 2 (two) times daily.     lidocaine-prilocaine cream  Commonly known as:  EMLA  Apply 1 application topically as needed (before dialysis).     LORazepam 2 MG/ML injection  Commonly known as:  ATIVAN  Inject 1 mL (2 mg total) into the vein every 4 (four) hours as needed for seizure.     methylPREDNISolone sodium succinate 125 mg/2 mL injection  Commonly known as:  SOLU-MEDROL  Inject 2 mLs (125 mg total) into the vein daily.  Start taking on:  03/20/2013     morphine 2 MG/ML injection  Inject 1 mL (2 mg total) into the vein every 4 (four) hours as needed.     ondansetron 4 MG tablet  Commonly known as:  ZOFRAN  Take 1 tablet (4 mg total) by mouth every 6 (six) hours as needed for nausea.     ondansetron 4  MG/2ML Soln injection  Commonly known as:  ZOFRAN  Inject 2 mLs (4 mg total) into the vein every 6 (six) hours as needed for nausea.     oxyCODONE 5 MG immediate release tablet  Commonly known as:  Oxy IR/ROXICODONE  Take 1 tablet (5 mg total) by mouth every 4 (four) hours as needed.     pantoprazole 40 MG tablet  Commonly known as:  PROTONIX  Take 40 mg by mouth daily.     promethazine 25 MG tablet  Commonly known as:  PHENERGAN  Take 25 mg by mouth 3 (three) times daily as needed for nausea.     sodium chloride 0.9 % SOLN 100 mL with levETIRAcetam 500 MG/5ML SOLN 500 mg  Inject 500 mg into the vein daily.  Start taking on:  03/20/2013       No Known Allergies Follow-up Information   Follow up with Transfer to [facility].       The results of significant diagnostics from this hospitalization (including imaging, microbiology, ancillary and laboratory) are listed below for reference.    Significant Diagnostic Studies: Ct Abdomen Pelvis Wo Contrast  03/19/2013   CLINICAL DATA:  Initial encounter for acute mental status changes and abdominal pain. Current history of lupus and end-stage renal disease on hemodialysis.  EXAM: CT ABDOMEN AND PELVIS WITHOUT CONTRAST  TECHNIQUE: Multidetector CT imaging of the abdomen and pelvis was performed following the standard protocol without intravenous contrast.  COMPARISON:  CT 01/23/2013, 11/30/2012 and MRI abdomen 03/03/2011.  FINDINGS: Geographic areas of low attenuation throughout the liver; allowing for the unenhanced technique, no focal hepatic parenchymal mass. Numerous calcified granulomata throughout the otherwise normal-appearing spleen. Normal unenhanced appearance of the pancreas and adrenal glands. Gallbladder surgically absent. No biliary ductal dilation. Atrophic kidneys consistent with end stage renal disease. No visible aortoiliofemoral atherosclerosis. No significant lymphadenopathy.  Normal-appearing stomach, small bowel, and  colon. Moderate amount of ascites dependently in the pelvis.  Uterus and ovaries unremarkable for the unenhanced technique. Urinary bladder completely decompressed. Phleboliths low in the left side of the pelvis.  Atelectasis in the visualized lower lobes. No visible pleural effusions. Large pericardial effusion. Moderate cardiac enlargement. Calcification involving the distal right coronary artery. Extensive diffuse body wall edema.  IMPRESSION: 1. Moderate ascites dependently in the pelvis. 2. Anasarca. 3. No acute abnormalities otherwise involving the abdomen or pelvis. 4. Large pericardial effusion. 5. Numerous splenic granulomata. 6. Focal areas of hepatic steatosis. No hepatic parenchymal masses. 7. Atrophic kidneys consistent with end stage renal disease.   Electronically Signed   By: Evangeline Dakin M.D.   On: 03/19/2013 09:01   Dg Chest 2 View  03/19/2013   CLINICAL DATA:  Subsequent evaluation of cough and cardiac silhouette enlargement. Current history of lupus and end-stage renal disease on hemodialysis.  EXAM: CHEST  2 VIEW  COMPARISON:  Portable chest x-ray earlier same date. Two-view chest x-ray 12/05/2012.  FINDINGS: Massive enlargement of the cardiac silhouette, shown on the CT abdomen earlier to represent a large pericardial effusion. Pulmonary vascularity normal without evidence of pulmonary edema. The airspace consolidation at the right lung base suspected on the portable examination earlier same day is no longer visible, indicating that it represented atelectasis. Lungs clear. No pleural effusions.  IMPRESSION: 1. Opacities at the right lung base on the portable chest x-ray earlier same date have resolved, indicating atelectasis rather than pneumonia. 2. Massive enlargement of the cardiac silhouette due to a large pericardial effusion as noted on the earlier CT. 3. Mild pulmonary venous hypertension without overt pulmonary edema. 4. No acute cardiopulmonary disease.   Electronically Signed    By: Evangeline Dakin M.D.   On: 03/19/2013 09:31   Ct Head Wo Contrast (only If Suspected Head Trauma And/or Pt Is On Anticoagulant)  03/19/2013   CLINICAL DATA:  Initial encounter for acute mental status changes. Current history of lupus and end-stage renal disease on hemodialysis.  EXAM: CT HEAD WITHOUT CONTRAST  TECHNIQUE: Contiguous axial images were obtained from the base of the skull through the vertex without intravenous contrast.  COMPARISON:  Ventricular system normal in size and appearance for age. No mass lesion. No midline shift. No acute hemorrhage or hematoma. No extra-axial fluid collections. No evidence of acute infarction. Borderline to mild cortical atrophy for age.  No focal osseous abnormality involving the skull. Visualized paranasal sinuses, bilateral mastoid air cells, and bilateral middle ear cavities well-aerated.  FINDINGS: 1. No acute intracranial abnormality. 2. Borderline to mild cortical atrophy for age.   Electronically Signed   By: Evangeline Dakin M.D.   On: 03/19/2013 08:47   Dg Chest Port 1 View  03/19/2013   CLINICAL DATA:  Initial encounter for cough and acute mental status changes. Current history of lupus and end-stage renal disease on hemodialysis.  EXAM: PORTABLE CHEST - 1 VIEW  COMPARISON:  Two-view chest x-rays 12/05/2012 dating back to 07/18/2011.  FINDINGS: Cardiac silhouette massively enlarged, significantly increased in size since 12/05/2012. Mild pulmonary venous hypertension without overt edema. Airspace consolidation with air bronchograms in the right lower lobe. Lungs otherwise clear. No visible pleural effusions.  IMPRESSION: 1. Massive cardiac enlargement. The fact that the cardiac silhouette is so much larger than on the examination 3-1/2 months ago raises the question of pericardial effusion. 2. Mild pulmonary venous hypertension without overt edema. 3. Right lower lobe pneumonia suspected.   Electronically Signed   By: Evangeline Dakin M.D.   On:  03/19/2013 08:43    Microbiology: Recent Results (from the past 240 hour(s))  CULTURE, BLOOD (ROUTINE X 2)     Status: None   Collection Time    03/19/13  8:01 AM      Result Value Range Status   Specimen Description     Final   Value: BLOOD RIGHT ANTECUBITAL BOTTLES DRAWN AEROBIC AND ANAEROBIC   Special Requests 8CC   Final   Culture NO GROWTH <24 HRS   Final   Report Status PENDING   Incomplete  CULTURE, BLOOD (ROUTINE X 2)     Status: None   Collection Time    03/19/13  8:02 AM      Result Value Range Status   Specimen Description BLOOD BLOOD LEFT HAND BOTTLES DRAWN AEROBIC ONLY   Final   Special Requests [facility]   Final   Culture  NO GROWTH <24 HRS   Final   Report Status PENDING   Incomplete  GRAM STAIN     Status: None   Collection Time    03/19/13 10:27 AM      Result Value Range Status   Specimen Description CSF   Final   Special Requests Immunocompromised   Final   Gram Stain     Final   Value: NO ORGANISMS SEEN     Gram Stain Report Called to,Read Back By and Verified With: TOLER M. AT 1203PM ON [id number] BY THOMPSON S.   Report Status 03/19/2013 FINAL   Final  MRSA PCR SCREENING     Status: None   Collection Time    03/19/13 12:03 PM      Result Value Range Status   MRSA by PCR NEGATIVE  NEGATIVE Final   Comment:            The GeneXpert MRSA Assay (FDA     approved for NASAL specimens     only), is one component of a     comprehensive MRSA colonization     surveillance program. It is not     intended to diagnose MRSA     infection nor to guide or     monitor treatment for     MRSA infections.     Labs: Basic Metabolic Panel:  Recent Labs Lab 03/19/13 0739  NA 135  K 5.0  CL 93*  CO2 29  GLUCOSE 99  BUN 34*  CREATININE 7.21*  CALCIUM 9.4   Liver Function Tests:  Recent Labs Lab 03/19/13 0739  AST 40*  ALT 16  ALKPHOS 107  BILITOT 0.7  PROT 8.0  ALBUMIN 3.0*    Recent Labs Lab 03/19/13 0739  LIPASE 79*   No results found for this  basename: AMMONIA,  in the last 168 hours CBC:  Recent Labs Lab 03/19/13 0739  WBC 2.0*  HGB 12.0  HCT 39.2  MCV 90.7  PLT 123*   Cardiac Enzymes: No results found for this basename: CKTOTAL, CKMB, CKMBINDEX, TROPONINI,  in the last 168 hours BNP: BNP (last 3 results)  Recent Labs  06/11/12 1251  PROBNP 9526.0*   CBG:  Recent Labs Lab 03/19/13 0758  GLUCAP 87    Signed:  Doyce Stonehouse K  [facility] 03/19/2013, 5:39 PM

## 2013-03-19 NOTE — Consult Note (Signed)
Maria Vazquez MRN: [id number] DOB/AGE: 1975/05/26 37 y.o. Primary Care Physician:POWELL,ALVIN C, MD Admit date: 03/19/2013 Chief Complaint:  Chief Complaint  Patient presents with  . Altered Mental Status  . Nausea   HPI:  From Pt Husband, Sister inlaw and medical records   Pt is 37 year old female with past medical hx of ESRD who was brought to ER with C/O AMS. HPI dates back to past few days when pt started having intractable headaches associated with nausea and vomiting. Pt was brought to Er where LP was being done and pt had seizures. Pt sister in law also told about having had seizure episode at HD center where she had gone for Hd but had not gone in  yet.Pt now admitted in ICU. Pt received meds and status post seizure. Pt not answering any questions. Pt Husband has issues with language and doesn't know much medical history of pt.  Pt sister provided much of history.   Past Medical History  Diagnosis Date  . Gallstone pancreatitis Feb 2011  . Cervix carcinoma in situ Mar 2011  . Proteinuria - cause not known   . Hypertension   . Lupus   . CHF (congestive heart failure)   . Anemia   . Thrombocytopenia   . Blood transfusion   . Dialysis patient     tues, thursday, saturday.  . Renal failure, acute on chronic   . Chronic kidney disease     T, Th, Sat  . Lupus nephritis   . Renal insufficiency        Family History  Problem Relation Age of Onset  . Diabetes Mother   . Diabetes Brother   . Diabetes Brother   . Diabetes Maternal Grandmother   . Anesthesia problems Neg Hx    Social History:  reports that she has never smoked. She has never used smokeless tobacco. She reports that she does not drink alcohol or use illicit drugs.   Allergies: No Known Allergies  Medications Prior to Admission  Medication Sig Dispense Refill  . labetalol (NORMODYNE) 100 MG tablet Take 100 mg by mouth 2 (two) times daily.      Marland Kitchen lidocaine-prilocaine (EMLA) cream Apply 1  application topically as needed (before dialysis).      . pantoprazole (PROTONIX) 40 MG tablet Take 40 mg by mouth daily.      . promethazine (PHENERGAN) 25 MG tablet Take 25 mg by mouth 3 (three) times daily as needed for nausea.           [id number] to gather as pt is not communicating.   Derrill Memo ON 03/20/2013] acyclovir  5 mg/kg (Order-Specific) Intravenous Q24H  . cefTRIAXone (ROCEPHIN)  IV  2 g Intravenous Q12H  . heparin  5,000 Units Subcutaneous Q8H  . [START ON 03/20/2013] levETIRAcetam  500 mg Intravenous Q24H  . [START ON 03/20/2013] methylPREDNISolone (SOLU-MEDROL) injection  125 mg Intravenous Q24H  . sodium chloride  3 mL Intravenous Q12H       Physical Exam: Vital signs in last 24 hours: Temp:  [98.9 F (37.2 C)] 98.9 F (37.2 C) (11/01 0804) Pulse Rate:  [102-113] 110 (11/01 1330) Resp:  [10-26] 15 (11/01 1330) BP: (141-164)/(98-113) 141/100 mmHg (11/01 1330) SpO2:  [100 %] 100 % (11/01 1330) Weight:  [131 lb 2.8 oz (59.5 kg)] 131 lb 2.8 oz (59.5 kg) (11/01 1145) Weight change:     Intake/Output from previous day:   Total I/O In: 150 [IV Piggyback:150] Out: -    Physical  Exam: General- pt is Lethargic , not following commands. Resp- No acute REsp distress, Decare sed Bs at bases. CVS- S1S2 regular in rate and rhythm distant  GIT- BS+, soft, NT, ND EXT- NO LE Edema, Cyanosis Psych- Unable to assess Access-  AVF+   Lab Results: CBC  Recent Labs  03/19/13 0739  WBC 2.0*  HGB 12.0  HCT 39.2  PLT 123*    BMET  Recent Labs  03/19/13 0739  NA 135  K 5.0  CL 93*  CO2 29  GLUCOSE 99  BUN 34*  CREATININE 7.21*  CALCIUM 9.4    MICRO Recent Results (from the past 240 hour(s))  CULTURE, BLOOD (ROUTINE X 2)     Status: None   Collection Time    03/19/13  8:01 AM      Result Value Range Status   Specimen Description     Final   Value: BLOOD RIGHT ANTECUBITAL BOTTLES DRAWN AEROBIC AND ANAEROBIC   Special Requests 8CC   Final    Culture NO GROWTH <24 HRS   Final   Report Status PENDING   Incomplete  CULTURE, BLOOD (ROUTINE X 2)     Status: None   Collection Time    03/19/13  8:02 AM      Result Value Range Status   Specimen Description BLOOD BLOOD LEFT HAND BOTTLES DRAWN AEROBIC ONLY   Final   Special Requests 8CC   Final   Culture NO GROWTH <24 HRS   Final   Report Status PENDING   Incomplete  GRAM STAIN     Status: None   Collection Time    03/19/13 10:27 AM      Result Value Range Status   Specimen Description CSF   Final   Special Requests Immunocompromised   Final   Gram Stain     Final   Value: NO ORGANISMS SEEN     Gram Stain Report Called to,Read Back By and Verified With: TOLER M. AT 1203PM ON [id number] BY THOMPSON S.   Report Status 03/19/2013 FINAL   Final  MRSA PCR SCREENING     Status: None   Collection Time    03/19/13 12:03 PM      Result Value Range Status   MRSA by PCR NEGATIVE  NEGATIVE Final   Comment:            The GeneXpert MRSA Assay (FDA     approved for NASAL specimens     only), is one component of a     comprehensive MRSA colonization     surveillance program. It is not     intended to diagnose MRSA     infection nor to guide or     monitor treatment for     MRSA infections.      Lab Results  Component Value Date   PTH 287.8* 03/06/2011   CALCIUM 9.4 03/19/2013   CAION 1.20 04/12/2012   PHOS 2.5 03/22/2011   CXR  IMPRESSION:  1. Opacities at the right lung base on the portable chest x-ray  earlier same date have resolved, indicating atelectasis rather than  pneumonia.  2. Massive enlargement of the cardiac silhouette due to a large  pericardial effusion as noted on the earlier CT.  3. Mild pulmonary venous hypertension without overt pulmonary edema.  4. No acute cardiopulmonary disease.    Impression: 1)Renal  ESRD on HD               ON TTS  schedule               Pt last Hd was this thurday  2)HTN Target Organ damage  CKD  Medication- On Alpha and beta  Blockers   3)Anemia HGb at goal (9--11)   4)CKD Mineral-Bone Disorder PTH acceptable. Secondary Hyperparathyroidism  Present. Phosphorus at goal.   5)CNS - admitted with Seizures Etiology Infectious vs SLE  6)FEN  Normokalemic NOrmonatremic   7)Acid base Co2 at goal  8) SLE     ? Pericardial effusion      ?Cerebritis        Plan:  Will dialyze today . Pt will benefit from  Neurology consult.  Pt will benefit from  Rheumatologist and probably Cardiac Surgeon/Cardiologist/Interventional  Radio to help w drainage of pericardial fluid. In case pt transfer is arranged and dialysis will delay transfer will not dialyze to help with the process      Italia Wolfert S 03/19/2013, 2:19 PM

## 2013-03-20 NOTE — Progress Notes (Signed)
Attempted to give report  @ 2300 and 2315. Report finally given to Angela at 0002.

## 2013-03-22 NOTE — Progress Notes (Signed)
UR Chart Review Completed  

## 2013-03-23 LAB — CSF CULTURE W GRAM STAIN: Gram Stain: NONE SEEN

## 2013-03-24 LAB — CULTURE, BLOOD (ROUTINE X 2)

## 2013-04-05 ENCOUNTER — Encounter (HOSPITAL_COMMUNITY)
Admission: RE | Admit: 2013-04-05 | Discharge: 2013-04-05 | Disposition: A | Discharge status: Home / Self Care | Payer: Medicaid Other | Source: Ambulatory Visit | Attending: Medical | Admitting: Medical

## 2013-04-05 DIAGNOSIS — D631 Anemia in chronic kidney disease: Secondary | ICD-10-CM | POA: Insufficient documentation

## 2013-04-06 ENCOUNTER — Encounter (HOSPITAL_COMMUNITY)
Admission: RE | Admit: 2013-04-06 | Discharge: 2013-04-06 | Disposition: A | Discharge status: Home / Self Care | Payer: Medicaid Other | Source: Ambulatory Visit | Attending: Medical | Admitting: Medical

## 2013-04-06 NOTE — Progress Notes (Signed)
Awake. Graham crackers and coke given to drink. Tolerated well. Blood infusing without difficulty. Tolerated well.

## 2013-04-06 NOTE — Progress Notes (Signed)
#  1 unit PRBC'S started and then stopped. None infused. Contaminated.

## 2013-04-06 NOTE — Progress Notes (Signed)
2 units of PRBC'S infused. No reaction. Voices no c/o at this time. D/C to home in good condition.

## 2013-04-06 NOTE — Progress Notes (Signed)
Here for blood transfusion. Dx anemia. Voices no c/o at this time.

## 2013-04-07 LAB — TYPE AND SCREEN
Antibody Screen: POSITIVE
DAT, IgG: POSITIVE
Donor AG Type: NEGATIVE
Unit division: 0
Unit division: 0

## 2013-06-15 ENCOUNTER — Encounter (HOSPITAL_COMMUNITY): Payer: Self-pay | Admitting: Emergency Medicine

## 2013-06-15 ENCOUNTER — Emergency Department (HOSPITAL_COMMUNITY): Payer: Self-pay

## 2013-06-15 ENCOUNTER — Emergency Department (HOSPITAL_COMMUNITY)
Admission: EM | Admit: 2013-06-15 | Discharge: 2013-06-16 | Disposition: A | Discharge status: Home / Self Care | Payer: Self-pay | Attending: Emergency Medicine | Admitting: Emergency Medicine

## 2013-06-15 DIAGNOSIS — IMO0002 Reserved for concepts with insufficient information to code with codable children: Secondary | ICD-10-CM | POA: Insufficient documentation

## 2013-06-15 DIAGNOSIS — Z8541 Personal history of malignant neoplasm of cervix uteri: Secondary | ICD-10-CM | POA: Insufficient documentation

## 2013-06-15 DIAGNOSIS — S199XXA Unspecified injury of neck, initial encounter: Secondary | ICD-10-CM

## 2013-06-15 DIAGNOSIS — Z992 Dependence on renal dialysis: Secondary | ICD-10-CM | POA: Insufficient documentation

## 2013-06-15 DIAGNOSIS — Z792 Long term (current) use of antibiotics: Secondary | ICD-10-CM | POA: Insufficient documentation

## 2013-06-15 DIAGNOSIS — I120 Hypertensive chronic kidney disease with stage 5 chronic kidney disease or end stage renal disease: Secondary | ICD-10-CM | POA: Insufficient documentation

## 2013-06-15 DIAGNOSIS — Y939 Activity, unspecified: Secondary | ICD-10-CM | POA: Insufficient documentation

## 2013-06-15 DIAGNOSIS — S8392XA Sprain of unspecified site of left knee, initial encounter: Secondary | ICD-10-CM

## 2013-06-15 DIAGNOSIS — Z79899 Other long term (current) drug therapy: Secondary | ICD-10-CM | POA: Insufficient documentation

## 2013-06-15 DIAGNOSIS — Z862 Personal history of diseases of the blood and blood-forming organs and certain disorders involving the immune mechanism: Secondary | ICD-10-CM | POA: Insufficient documentation

## 2013-06-15 DIAGNOSIS — W1809XA Striking against other object with subsequent fall, initial encounter: Secondary | ICD-10-CM | POA: Insufficient documentation

## 2013-06-15 DIAGNOSIS — S0993XA Unspecified injury of face, initial encounter: Secondary | ICD-10-CM | POA: Insufficient documentation

## 2013-06-15 DIAGNOSIS — N186 End stage renal disease: Secondary | ICD-10-CM | POA: Insufficient documentation

## 2013-06-15 DIAGNOSIS — W19XXXA Unspecified fall, initial encounter: Secondary | ICD-10-CM

## 2013-06-15 DIAGNOSIS — Y92009 Unspecified place in unspecified non-institutional (private) residence as the place of occurrence of the external cause: Secondary | ICD-10-CM | POA: Insufficient documentation

## 2013-06-15 DIAGNOSIS — I509 Heart failure, unspecified: Secondary | ICD-10-CM | POA: Insufficient documentation

## 2013-06-15 DIAGNOSIS — Z8739 Personal history of other diseases of the musculoskeletal system and connective tissue: Secondary | ICD-10-CM | POA: Insufficient documentation

## 2013-06-15 MED ORDER — OXYCODONE-ACETAMINOPHEN 5-325 MG PO TABS
1.0000 | ORAL_TABLET | Freq: Once | ORAL | Status: AC
  Administered 2013-06-15: 1 via ORAL
  Filled 2013-06-15: qty 1

## 2013-06-15 NOTE — ED Notes (Signed)
PT STATES HER LEFT KNEE WENT OUT AROUND 6:30 OR 7PM. PT FELL TO THE GROUND AND ALSO HIT THE LEFT SIDE OF HER FACE. PT C/O PAIN TO LEFT KNEE AND LEFT SIDE FACE.

## 2013-06-15 NOTE — ED Provider Notes (Signed)
CSN: [id number]     Arrival date & time 06/15/13  2021 History  This chart was scribed for Delora Fuel, MD by Zettie Pho, ED Scribe. This patient was seen in room APA04/APA04 and the patient's care was started at 11:47 PM.    No chief complaint on file.  The history is provided by the patient. No language interpreter was used.   HPI Comments: Maria Vazquez is a 38 y.o. female who presents to the Emergency Department complaining of a constant pain to the left knee, 8/10 currently, secondary to a fall that occurred around 5 hours ago. Patient states that she has been ambulatory since the incident, but that the pain is exacerbated with bearing weight. She states that she did hit her head with an associated pain to the left side of the face, but denies loss of consciousness. Patient reports some associated dizziness and confusion. She denies nausea. Patient has a history of HTN, lupus, CHF, thrombocytopenia, chronic renal failure.   Past Medical History  Diagnosis Date  . Gallstone pancreatitis Feb 2011  . Cervix carcinoma in situ Mar 2011  . Proteinuria - cause not known   . Hypertension   . Lupus   . CHF (congestive heart failure)   . Anemia   . Thrombocytopenia   . Blood transfusion   . Dialysis patient     tues, thursday, saturday.  . Renal failure, acute on chronic   . Chronic kidney disease     T, Th, Sat  . Lupus nephritis   . Renal insufficiency    Past Surgical History  Procedure Laterality Date  . Appendectomy    . Cervical cone biopsy    . Cesarean section    . Cholecystectomy    . Embolectomy  10/10/2011    Procedure: EMBOLECTOMY;  Surgeon: Angelia Mould, MD;  Location: [facility];  Service: Vascular;  Laterality: Right;  . Av fistula placement    . Av fistula placement  07/18/2011    Procedure: ARTERIOVENOUS (AV) FISTULA CREATION;  Surgeon: Angelia Mould, MD;  Location: [facility];  Service: Vascular;  Laterality: Right;  . Av fistula placement  01/23/12     Revision of Right AVF   Family History  Problem Relation Age of Onset  . Diabetes Mother   . Diabetes Brother   . Diabetes Brother   . Diabetes Maternal Grandmother   . Anesthesia problems Neg Hx    History  Substance Use Topics  . Smoking status: Never Smoker   . Smokeless tobacco: Never Used  . Alcohol Use: No   OB History   Grav Para Term Preterm Abortions TAB SAB Ect Mult Living   4 3   1  1   3      Review of Systems  HENT:       Left-sided facial pain  Musculoskeletal: Positive for arthralgias (left knee).  Neurological: Positive for dizziness. Negative for syncope.  Psychiatric/Behavioral: Positive for confusion.  All other systems reviewed and are negative.    Allergies  Review of patient's allergies indicates no known allergies.  Home Medications   Current Outpatient Rx  Name  Route  Sig  Dispense  Refill  . calcium acetate, Phos Binder, (PHOSLYRA) 667 MG/5ML SOLN   Oral   Take 1,334 mg by mouth 3 (three) times daily with meals.         . calcium carbonate (TUMS EX) 750 MG chewable tablet   Oral   Chew 3 tablets by mouth daily.  1000 mg each         . cholecalciferol (VITAMIN D) 1000 UNITS tablet   Oral   Take 1,000 Units by mouth daily.         . hydroxychloroquine (PLAQUENIL) 200 MG tablet   Oral   Take 200 mg by mouth daily.          Marland Kitchen levETIRAcetam (KEPPRA) 250 MG tablet   Oral   Take 250 mg by mouth every Tuesday, Thursday, and Saturday at 6 PM.          . levETIRAcetam (KEPPRA) 500 MG tablet   Oral   Take 500 mg by mouth 2 (two) times daily.         Marland Kitchen lidocaine-prilocaine (EMLA) cream   Topical   Apply 1 application topically as needed (before dialysis).         . nystatin (MYCOSTATIN) 100000 UNIT/ML suspension   Oral   Take 5 mLs by mouth 3 (three) times daily.         Marland Kitchen sulfamethoxazole-trimethoprim (BACTRIM DS) 800-160 MG per tablet   Oral   Take 1 tablet by mouth 2 (two) times daily.          Triage  Vitals: BP 135/88  Pulse 79  Temp(Src) 97.4 F (36.3 C)  Resp 20  Ht 5\' 6"  (1.676 m)  Wt 135 lb (61.236 kg)  BMI 21.80 kg/m2  SpO2 97%  LMP 06/12/2013  Physical Exam  Nursing note and vitals reviewed. Constitutional: She is oriented to person, place, and time. She appears well-developed and well-nourished. No distress.  HENT:  Head: Normocephalic and atraumatic.  Mild tenderness to the left temporal area.   Eyes: Conjunctivae are normal.  Neck: Normal range of motion. Neck supple.  Cardiovascular: Normal rate, regular rhythm and normal heart sounds.   Pulmonary/Chest: Effort normal and breath sounds normal. No respiratory distress.  Abdominal: She exhibits no distension.  Musculoskeletal: Normal range of motion.  Small effusion present in the left knee with mild, diffuse tenderness. Full passive range of motion. No instability. Distal neurovascularly intact. AV fistula present in the right, upper arm with thrill present.   Neurological: She is alert and oriented to person, place, and time.  Skin: Skin is warm and dry.  Psychiatric: She has a normal mood and affect. Her behavior is normal.    ED Course  Procedures (including critical care time)  DIAGNOSTIC STUDIES: Oxygen Saturation is 97% on room air, normal by my interpretation.    COORDINATION OF CARE: 11:51 PM- Will order an x-ray of the left knee and CT scans of the head and C spine. Will order Percocet to manage symptoms. Discussed treatment plan with patient at bedside and patient verbalized agreement.   Imaging Review Ct Head Wo Contrast  06/16/2013   CLINICAL DATA:  Fall, dizziness, confusion, pain  EXAM: CT HEAD WITHOUT CONTRAST  CT CERVICAL SPINE WITHOUT CONTRAST  TECHNIQUE: Multidetector CT imaging of the head and cervical spine was performed following the standard protocol without intravenous contrast. Multiplanar CT image reconstructions of the cervical spine were also generated.  COMPARISON:  03/19/2013   FINDINGS: CT HEAD FINDINGS  No acute intracranial hemorrhage, mass lesion, definite infarction, midline shift, herniation, hydrocephalus, or extra-axial fluid collection. Normal gray-white matter differentiation. Cisterns patent. No cerebellar abnormality. Symmetric orbits. Mastoids clear. Minor left maxillary mucosal thickening otherwise sinuses are clear.  CT CERVICAL SPINE FINDINGS  Normal cervical spine alignment. Negative for fracture, compression deformity or focal kyphosis. Facets aligned. Preserved  vertebral body heights and disc spaces. Normal prevertebral soft tissues. Intact odontoid. Clear lung apices. No soft tissue asymmetry in the neck.  IMPRESSION: No acute intracranial finding.  No acute cervical spine fracture or osseous abnormality.   Electronically Signed   By: Daryll Brod M.D.   On: 06/16/2013 01:08   Ct Cervical Spine Wo Contrast  06/16/2013   CLINICAL DATA:  Fall, dizziness, confusion, pain  EXAM: CT HEAD WITHOUT CONTRAST  CT CERVICAL SPINE WITHOUT CONTRAST  TECHNIQUE: Multidetector CT imaging of the head and cervical spine was performed following the standard protocol without intravenous contrast. Multiplanar CT image reconstructions of the cervical spine were also generated.  COMPARISON:  03/19/2013  FINDINGS: CT HEAD FINDINGS  No acute intracranial hemorrhage, mass lesion, definite infarction, midline shift, herniation, hydrocephalus, or extra-axial fluid collection. Normal gray-white matter differentiation. Cisterns patent. No cerebellar abnormality. Symmetric orbits. Mastoids clear. Minor left maxillary mucosal thickening otherwise sinuses are clear.  CT CERVICAL SPINE FINDINGS  Normal cervical spine alignment. Negative for fracture, compression deformity or focal kyphosis. Facets aligned. Preserved vertebral body heights and disc spaces. Normal prevertebral soft tissues. Intact odontoid. Clear lung apices. No soft tissue asymmetry in the neck.  IMPRESSION: No acute intracranial  finding.  No acute cervical spine fracture or osseous abnormality.   Electronically Signed   By: Daryll Brod M.D.   On: 06/16/2013 01:08   Dg Knee Complete 4 Views Left  06/16/2013   CLINICAL DATA:  Fall, pain  EXAM: LEFT KNEE - COMPLETE 4+ VIEW  COMPARISON:  None.  FINDINGS: Normal alignment. Relatively preserved joint spaces. No significant arthropathy. No fracture. Question small effusion on the lateral view.  IMPRESSION: Possible small knee joint effusion.  No acute osseous finding   Electronically Signed   By: Daryll Brod M.D.   On: 06/16/2013 00:48   MDM   1. Fall at home   2. Sprain of left knee    Fall with injury to left knee. No evidence of major ligamentous injury but effusion is present. Also, head injury. She'll be sent for CT of head and cervical spine and plain x-rays of left knee.  X-ray shows small effusion which is consistent with clinical exam. She's given a knee immobilizer and crutches to use as needed and is discharged with prescription for oxycodone-acetaminophen. She had been given a dose of oxycodone-acetaminophen in the ED with good relief of pain. She is referred to orthopedics for followup.  I personally performed the services described in this documentation, which was scribed in my presence. The recorded information has been reviewed and is accurate.      Delora Fuel, MD [id number]

## 2013-06-16 ENCOUNTER — Emergency Department (HOSPITAL_COMMUNITY): Payer: Self-pay

## 2013-06-16 MED ORDER — OXYCODONE-ACETAMINOPHEN 5-325 MG PO TABS: 1.0000 | ORAL_TABLET | ORAL | Status: DC | PRN

## 2013-06-16 NOTE — Discharge Instructions (Signed)
Use el inmovilizador de rodilla, segn sea necesario. Use las muletas cuando sea necesario.  Prevencin de las cadas y seguridad en Engineer, mining  (Fall Prevention and Animal nutritionist) Las cadas causan lesiones y Lawyer a personas de todas las edades. Es posible utilizar medidas preventivas para disminuir significativamente la probabilidad de cadas. Hay medidas simples que pueden hacer de su hogar un lugar seguro y Product/process development scientist las cadas.  [place] grietas y los bordes de aceras y Chartered certified accountant.  Retire los [employer].  Recorte los arbustos en el camino principal.  Coloque una buena iluminacin en el exterior.  Elimine las herramientas, piedras y escombros de los senderos.  Compruebe que los pasamanos no se rompan y estn bien sujetos. Debe haber pasamanos a ambos lados de las escaleras .  Limpie regularmente hojas, nieve y hielo.  Utilice arena o sal en los pasillos durante los meses de invierno.  En el garaje, limpiar la grasa o los derrames de combustible. EN EL BAO   Instale luces de noche.  Instale barras de apoyo en el inodoro, en la baera y en la ducha.  Utilice alfombras o calcomanas antideslizantes en la baera o ducha.  Coloque un taburete de plstico antideslizante en la ducha para sentarse, si es necesario.  Mantenga los pisos limpios y seque toda el agua del suelo inmediatamente.  Elimine regularmente la acumulacin de jabn en la baera o la ducha.  Asegure las alfombras de bao con una cinta para alfombra doble faz.  Retire las alfombras y todo lo que pueda ser un un riesgo. HABITACIONES   Instale luces de noche.  Asegrese de que la luz de la mesita sea de fcil [place].  No utilice ropa de cama de gran tamao.  Mantenga un telfono junto a su cama.  Tenga una silla firme con apoyabrazos para usar cuando se viste.  Retire las alfombras y lo que pueda ser un riesgo de cadas. Calio ollas y sartenes  hacia el centro del horno. Use los quemadores de atrs siempre que sea posible.  Limpie los derrames rpidamente y de tiempo para el secado.  Evite caminar sobre pisos mojados.  Evite utensilios y cuchillos calientes .  Cambie de posicin los estantes para que no sean demasiado altos o bajos.  Coloque los objetos de uso comn en lugares de fcil acceso.  Si es necesario, use una escalera firme con una barra de apoyo.  Mantenga los cables elctricos fuera del camino.  No use cera para pisos o limpiadores que dejen los pisos resbaladizos. Si tiene que usar cera, utilice cera antideslizante.  Retire del piso las alfombras y todo lo que pueda ser un un riesgo. ESCALERAS   Nunca deje objetos en las escaleras.  Coloque pasamanos a ambos lados de las escaleras y selos. Arregle los pasamanos sueltos. Asegrese que los pasamanos en ambos lados de las escaleras sean tan largos como las escaleras.  Verifique que la alfombra est bien asegurada a las escaleras. Repare rpidamente las alfombras desgastadas o sueltas .  Evite colocar alfombras en la parte superior o inferior de las escaleras, o asegure correctamente la alfombra con cinta adhesiva para alfombras para evitar el deslizamiento. Deshgase de alfombras, si es posible.  Pdale a un electricista que coloque un interruptor de la luz en la parte superior e inferior de las escaleras. OTROS CONSEJOS PARA LA PREVENCIN DE CADAS   Use zapatos de tacn bajo o con suela de  goma que sostengan y [place]. Use zapatos cerrados.  Al utilizar una escalera de [place], asegrese de que est completamente abierta y los dos travesaos firmemente bloqueados. No suba a una escalera de mano estando cerrada. .  Aada pintura de contraste o cinta de colores a las barras de apoyo y Buyer, retail en su casa. Coloque las tiras de contraste de Psychiatrist y el ltimo escaln.  Conozca y use los dispositivos de ayuda para la movilidad, segn sea  necesario. Instalar un sistema de respuestas de Biomedical scientist.  Prenda las luces para evitar las reas oscuras. Reponga inmediatamente las lamparillas elctricas que se hayan quemado. Coloque interruptores de luz luminiscentes.  Disponga los muebles de tal modo que pueda crear caminos despejados. Deje los muebles siempre en Therapist, sports.  Asegure firmemente las alfombras con cinta adhesiva de doble faz.  Elimine las superficies desparejas en los pisos.  Seleccione un diseo de alfombra que visualmente no oculte los bordes de las alfombras.  Tenga cuidado con las [employer]. OTROS CONSEJOS DE SEGURIDAD PARA EL HOGAR   Seleccione una temperatura de 120 F (48,8 C) para el agua.  Tenga los nmeros de emergencia cerca del telfono.  Coloque detectores de humo en cada nivel de su casa y cerca de los dormitorios. Document Released: 08/12/2007 Document Revised: 11/04/2011 [facility] Patient Information 2014 [place], [place].  Esguince de rodilla ( Knee Sprain) Un esguince de rodilla es un desgarro en uno de los tejidos fuertes y fibrosos (ligamentos) que conectan los huesos de la rodilla. La gravedad del esguince depende de cunto ligamento se rompe. La ruptura puede ser parcial o completa. CAUSAS  A menudo, los esguinces son el resultado de una cada o una lesin. La fuerza del impacto hace que las fibras del ligamento se estiren ms de su largo normal. Este exceso de tensin es la causa de que las fibras del ligamento se rompan. SIGNOS Y SNTOMAS  Es posible que pierda el movimiento de la rodilla. Otros sntomas son:  [place].  Dolor en la zona de la rodilla.  Sensibilidad de la rodilla al tacto.  Hinchazn. DIAGNSTICO  Para diagnosticar un esguince de rodilla, su mdico le har un examen fsico de la rodilla. Adems, puede indicarle que se haga una radiografa de la rodilla para asegurarse de que no haya huesos fracturados. TRATAMIENTO  Si el ligamento est parcialmente  roto, el tratamiento, habitualmente, consiste en mantener la rodilla en una posicin fija (inmovilizacin) o en usar un soporte durante algunas semanas cuando realice actividades que requieran movimiento. Para ello, su mdico colocar un vendaje, un yeso o una frula para impedir que la rodilla se Programmer, systems y para que le brinde apoyo durante los movimientos hasta que esta se cure. En el caso de un ligamento parcialmente roto, el proceso de curacin generalmente demora de 4 a 6semanas. Si el ligamento est completamente roto, segn de qu ligamento se trate, podr necesitar una ciruga para volver a unirlo al hueso o para Nutritional therapist. Despus de la ciruga, Firefighter un yeso o una frula que no podr quitarse durante 4 a 6semanas mientras el ligamento se [place]. INSTRUCCIONES PARA EL CUIDADO EN EL HOGAR  Mantenga elevada la rodilla lesionada para disminuir la hinchazn.  Para aliviar el dolor y la hinchazn, aplique hielo en la zona de la lesin:  Ponga el hielo en una bolsa plstica.  Colquese una toalla entre la piel y la bolsa de hielo.  Deje el hielo durante 20 minutos, 2 a 3 veces  por Training and development officer.  Tome los analgsicos nicamente como le indic su mdico.  No deje la rodilla sin proteccin hasta que el dolor y la rigidez desaparezcan (generalmente en el trmino de 4 a 6semanas).  Si tiene puesto un yeso o una frula, no deje que se moje. Si le han indicado que no puede quitrselo, cbralo con una bolsa plstica para darse Ardelia Mems ducha o un bao. No practique natacin.  Su mdico puede indicarle ejercicios para que haga durante la recuperacin, a fin de Chiropodist la debilidad y la rigidez permanentes. SOLICITE ATENCIN MDICA DE INMEDIATO SI:  El yeso o la frula se daan.  El dolor [place].  Tiene dolor intenso, hinchazn o adormecimiento debajo del yeso o la frula. ASEGRESE DE QUE:  Comprende estas instrucciones.  Controlar su afeccin.  Recibir ayuda de inmediato si no  mejora o si empeora. Document Released: 05/05/2005 Document Revised: 02/23/2013 [facility] Patient Information 2014 [place], [place].  Acetaminophen; Oxycodone tablets Qu es este medicamento? El compuesto ACETAMINOFENO; OXICODONA es un analgsico. Se utiliza para tratar los dolores leves a moderados. Este medicamento puede ser utilizado para otros usos; si tiene alguna pregunta consulte con su proveedor de atencin mdica o con su farmacutico. MARCAS COMERCIALES DISPONIBLES: Endocet, Magnacet, Narvox, Percocet, Perloxx, Primalev, Primlev, Roxicet, Xolox [employer] debo informar a mi profesional de la salud antes de tomar este medicamento? Necesita saber si usted presenta alguno de los [employer] o situaciones: -tumor cerebral -enfermedad de Crohn, enfermedad intestinal inflamatoria o colitis ulcerativa -abuso de drogas o drogadiccin -lesin de la cabeza -problemas cardiacos o circulatorios -si consume alcohol con frecuencia -enfermedad renal o problemas al orinar -enfermedad heptica -enfermedad pulmonar, asma o dificultades al respirar -una reaccin alrgica o inusual al acetaminofeno, a la oxicodona, a otros analgsicos opiceos, a otros medicamentos, alimentos, colorantes o conservantes -si est embarazada o buscando quedar embarazada -si est amamantando a un beb Cmo debo utilizar este medicamento? Tome este medicamento por va oral con un vaso lleno de agua. Siga las instrucciones de la etiqueta del [place]. Tome sus dosis a intervalos regulares. No tome su medicamento con una frecuencia mayor que la indicada. Hable con su pediatra para informarse acerca del uso de este medicamento en nios. Puede requerir Sales executive. Los pacientes de ms de 65 aos de edad pueden presentar reacciones ms fuertes a Fish farm manager y Designer, industrial/product dosis menores. Sobredosis: Pngase en contacto inmediatamente con un centro toxicolgico o una sala de urgencia si usted cree que haya  tomado demasiado medicamento. ATENCIN: [employer] es solo para usted. No comparta este medicamento con nadie. Qu sucede si me olvido de una dosis? Si olvida una dosis, tmela lo antes posible. Si es casi la hora de la prxima dosis, tome slo esa dosis. No tome dosis adicionales o dobles. Qu puede interactuar con este medicamento? -alcohol  -antihistamnicos -barbitricos tales como el amobarbital, butalbital, butabarbital, metohexital, pentobarbital, fenobarbital, tiopental y secobarbital -benztropina -medicamentos para problemas de vejiga, tales como solifenacina, trospium, oxibutinina, tolterodina, hiosciamina y metscopolamina -medicamentos para problemas respiratorios, tales como ipratropio y tiotropio -medicamentos para ciertos problemas estomacales o intestinales, tales como propantelina, homatropina metilbromuro, Sports administrator, atropina, belladona y diciclomina -anestsicos generales, tales como etomidato, [place], xido nitroso, propofol, desflurano, enflurano, halotano, isoflurano y sevoflurano -medicamentos para la depresin, ansiedad o trastornos psicticos -medicamentos para dormir -relajantes musculares -naltrexona -medicamentos narcticos (opiceos) para Conservation officer, historic buildings -fenotiazinas, tales como perfenacina, tioridazina, clorpromacina, mesoridazina, flufenazina, proclorperazina, promazina y trifluoperazina -escopolamina -tramadol -trihexifenidilo Puede ser que esta lista no menciona todas las  posibles interacciones. Informe a su profesional de KB Home	[place] de [employer] productos a base de hierbas, medicamentos de [place] o suplementos nutritivos que est tomando. Si usted fuma, consume bebidas alcohlicas o si utiliza drogas ilegales, indqueselo tambin a su profesional de KB Home	[place]. Algunas sustancias pueden interactuar con su medicamento. A qu debo estar atento al usar Coca[employer]a? Si el dolor no desaparece, si empeora o si experimenta un dolor nuevo o de tipo  diferente, consulte a su mdico o a su profesional de KB Home	[place]. Usted puede desarrollar tolerancia al medicamento. La tolerancia significa que necesitar una dosis ms alta para Best boy. Tolerancia es normal y esperada cuando est tomando este medicamento por un largo perodo de [place]. No suspenda el uso de su medicamento repentinamente debido a que puede Engineer, materials reaccin severa. Su cuerpo se acostumbra a Fish farm manager. Esto NO significa que sea adicto. La adiccin es un comportamiento que hace referencia a la obtencin y utilizacin de un medicamento con fines que no son mdicos. Si tiene Social research officer, government, existe una razn mdica para que usted tome un analgsico. Su mdico le indicar la cantidad de medicamento que Tree surgeon. Si su mdico desea que [employer], la dosis ser reducida gradualmente para Research officer, political party secundarios. Puede experimentar somnolencia o mareos. No conduzca ni utilice maquinaria ni haga nada que Associate Professor en estado de alerta hasta que sepa cmo le afecta este medicamento. No se siente ni se ponga de pie con rapidez, especialmente si es un paciente de edad avanzada. Esto reduce el riesgo de mareos o [employer]. El alcohol puede interferir con el efecto de este medicamento. Evite consumir bebidas alcohlicas. Hay distintos tipos de medicamentos narcticos (opiceos) para Conservation officer, historic buildings. Si usted toma ms que un tipo a la [employer], podr tener ms [employer] Corporation. Dar a su proveedor de atencin medica una lista de todos los medicamentos que usted [place]. Su mdico le informar la cantidad de medicamento que Aeronautical engineer. No tome ms medicamento que lo indicado. Comunquese con emergencia para ayuda si tiene problemas para respirar. Este medicamento causar estreimiento. Trate de evacuar los intestinos al menos cada 2  3 das. Si no evacua los intestinos durante 3 das, comunquese con su mdico o con su profesional de KB Home	[place]. No tome Tylenol  (acetaminofeno) u otros medicamentos que contienen acetaminofeno con este medicamento. Tomando mucho acetaminofeno puede ser muy peligroso. Muchos medicamentos de venta libre contienen acetaminofeno. Lea siempre las etiquetas cuidadosamente para evitar el tomar ms acetaminofeno. Qu efectos secundarios puedo tener al [employer] este medicamento? Efectos secundarios que debe informar a su mdico o a Barrister's clerk de la salud tan pronto como sea posible: -Scientist, clinical (histocompatibility and immunogenetics), tales como erupcin cutnea, picazn o urticarias, hinchazn de la cara, labios o lengua -dificultades respiratorias, sibilancias -confusin -sensacin de desmayos o aturdimiento -dolor de estmago severo -cansancio o debilidad inusual -color amarillento de los ojos o la piel Efectos secundarios que, por lo general, no requieren atencin mdica (debe informarlos a su mdico o a su profesional de la salud si persisten o si son molestos): -mareos -somnolencia -nuseas -vmitos Puede ser que esta lista no menciona todos los posibles efectos secundarios. Comunquese a su mdico por asesoramiento mdico [employer]. Usted puede informar los efectos secundarios a la FDA por telfono al 1-800-FDA-1088. Dnde debo guardar mi medicina? Mantngala fuera del alcance de los nios. Este medicamento puede ser abusado. Mantenga su medicamento en un lugar seguro para protegerlo contra robos. No comparta este  medicamento con nadie. Es peligroso vender o ceder este medicamento y est prohibido por la ley. Gurdelo a [employer], entre 20 y 59 grados C (79 y 64 grados F). Mantenga el envase bien cerrado. Protjalo de Naval architect.  Este medicamento puede causar muerte y sobredosis accidental si es tomado por otros adultos, nios o Copy. Tire los medicamentos que no haya utilizado al inodoro para reducir la posibilidad de dao. No use el medicamento despus de la fecha de vencimiento. ATENCIN: Este folleto es un  resumen. Puede ser que no cubra toda la posible informacin. Si usted tiene preguntas acerca de esta medicina, consulte con su mdico, su farmacutico o su profesional de Technical sales engineer.  2014, Elsevier/Gold Standard. (2013-01-17 19:44:45)

## 2013-10-08 ENCOUNTER — Encounter (HOSPITAL_COMMUNITY): Payer: Self-pay | Admitting: Emergency Medicine

## 2013-10-08 ENCOUNTER — Emergency Department (HOSPITAL_COMMUNITY): Payer: Medicaid Other

## 2013-10-08 ENCOUNTER — Emergency Department (HOSPITAL_COMMUNITY)
Admission: EM | Admit: 2013-10-08 | Discharge: 2013-10-08 | Disposition: A | Discharge status: Home / Self Care | Payer: Medicaid Other | Attending: Emergency Medicine | Admitting: Emergency Medicine

## 2013-10-08 DIAGNOSIS — R519 Headache, unspecified: Secondary | ICD-10-CM

## 2013-10-08 DIAGNOSIS — N186 End stage renal disease: Secondary | ICD-10-CM | POA: Insufficient documentation

## 2013-10-08 DIAGNOSIS — K047 Periapical abscess without sinus: Secondary | ICD-10-CM

## 2013-10-08 DIAGNOSIS — I509 Heart failure, unspecified: Secondary | ICD-10-CM | POA: Insufficient documentation

## 2013-10-08 DIAGNOSIS — Z992 Dependence on renal dialysis: Secondary | ICD-10-CM | POA: Insufficient documentation

## 2013-10-08 DIAGNOSIS — Z8542 Personal history of malignant neoplasm of other parts of uterus: Secondary | ICD-10-CM | POA: Diagnosis not present

## 2013-10-08 DIAGNOSIS — R51 Headache: Secondary | ICD-10-CM | POA: Insufficient documentation

## 2013-10-08 DIAGNOSIS — I32 Pericarditis in diseases classified elsewhere: Secondary | ICD-10-CM | POA: Diagnosis not present

## 2013-10-08 DIAGNOSIS — I120 Hypertensive chronic kidney disease with stage 5 chronic kidney disease or end stage renal disease: Secondary | ICD-10-CM | POA: Insufficient documentation

## 2013-10-08 DIAGNOSIS — K044 Acute apical periodontitis of pulpal origin: Secondary | ICD-10-CM | POA: Insufficient documentation

## 2013-10-08 DIAGNOSIS — Z862 Personal history of diseases of the blood and blood-forming organs and certain disorders involving the immune mechanism: Secondary | ICD-10-CM | POA: Insufficient documentation

## 2013-10-08 DIAGNOSIS — Z8719 Personal history of other diseases of the digestive system: Secondary | ICD-10-CM | POA: Diagnosis not present

## 2013-10-08 DIAGNOSIS — Z79899 Other long term (current) drug therapy: Secondary | ICD-10-CM | POA: Diagnosis not present

## 2013-10-08 MED ORDER — KETOROLAC TROMETHAMINE 30 MG/ML IJ SOLN
30.0000 mg | Freq: Once | INTRAMUSCULAR | Status: AC
  Administered 2013-10-08: 30 mg via INTRAVENOUS
  Filled 2013-10-08: qty 1

## 2013-10-08 MED ORDER — ONDANSETRON HCL 4 MG/2ML IJ SOLN
4.0000 mg | Freq: Once | INTRAMUSCULAR | Status: AC
  Administered 2013-10-08: 4 mg via INTRAVENOUS
  Filled 2013-10-08: qty 2

## 2013-10-08 MED ORDER — METOCLOPRAMIDE HCL 5 MG/ML IJ SOLN
10.0000 mg | Freq: Once | INTRAMUSCULAR | Status: AC
  Administered 2013-10-08: 10 mg via INTRAVENOUS
  Filled 2013-10-08: qty 2

## 2013-10-08 MED ORDER — HYDROMORPHONE HCL PF 1 MG/ML IJ SOLN
1.0000 mg | Freq: Once | INTRAMUSCULAR | Status: AC
Start: 2013-10-08 — End: 2013-10-08
  Administered 2013-10-08: 1 mg via INTRAVENOUS
  Filled 2013-10-08: qty 1

## 2013-10-08 MED ORDER — PENICILLIN V POTASSIUM 500 MG PO TABS: 500.0000 mg | ORAL_TABLET | Freq: Four times a day (QID) | ORAL | Status: AC

## 2013-10-08 MED ORDER — OXYCODONE-ACETAMINOPHEN 5-325 MG PO TABS: 1.0000 | ORAL_TABLET | ORAL | Status: DC | PRN

## 2013-10-08 MED ORDER — PENICILLIN V POTASSIUM 250 MG PO TABS
500.0000 mg | ORAL_TABLET | Freq: Once | ORAL | Status: AC
  Administered 2013-10-08: 500 mg via ORAL
  Filled 2013-10-08: qty 2

## 2013-10-08 NOTE — ED Provider Notes (Signed)
CSN: [id number]     Arrival date & time 10/08/13  2044 History  This chart was scribed for Hoy Morn, MD by Roxan Diesel, ED scribe.  This patient was seen in room APA10/APA10 and the patient's care was started at 9:18 PM.   Chief Complaint  Patient presents with  . Severe Headache     The history is provided by a relative, the patient and the spouse. Language interpreter used: Family member.    Level 5 Caveat due to severity of pain  HPI Comments: Maria Vazquez is a 38 y.o. female with h/o lupus nephritis, CKD (dialysis Tues-Thurs-Sat), CHF, thrombocytopenia, gallstone pancreatitis, and anemia who presents to the Emergency Department complaining of a severe headache that began 1.5 hours ago described as similar to a previous lupus flare-up.  Family state that pt was in her usual state of health today before her headache.  She had dialysis this morning which went well.  She initially thought she was developing a toothache but it then developed into a severe headache.  This was not preceded by dental pain before today.  Headache is worsened by light and loud noises.  She received 500mg  Tylenol at home.  Family deny vomiting.  Family state that they believe pt is having a lupus flare-up.  They note that she does have intermittent headaches occasionally, but they are typically not nearly as severe.  She has only had an equally severe headache on one prior occasion, at which time she had to be hospitalized in [place] for a flare-up.    Pt receives dialysis at [facility] in [place]   Past Medical History  Diagnosis Date  . Gallstone pancreatitis Feb 2011  . Cervix carcinoma in situ Mar 2011  . Proteinuria - cause not known   . Hypertension   . Lupus   . CHF (congestive heart failure)   . Anemia   . Thrombocytopenia   . Blood transfusion   . Dialysis patient     tues, thursday, saturday.  . Renal failure, acute on chronic   . Chronic kidney disease      T, Th, Sat  . Lupus nephritis   . Renal insufficiency     Past Surgical History  Procedure Laterality Date  . Appendectomy    . Cervical cone biopsy    . Cesarean section    . Cholecystectomy    . Embolectomy  10/10/2011    Procedure: EMBOLECTOMY;  Surgeon: Angelia Mould, MD;  Location: [facility];  Service: Vascular;  Laterality: Right;  . Av fistula placement    . Av fistula placement  07/18/2011    Procedure: ARTERIOVENOUS (AV) FISTULA CREATION;  Surgeon: Angelia Mould, MD;  Location: [facility];  Service: Vascular;  Laterality: Right;  . Av fistula placement  01/23/12    Revision of Right AVF    Family History  Problem Relation Age of Onset  . Diabetes Mother   . Diabetes Brother   . Diabetes Brother   . Diabetes Maternal Grandmother   . Anesthesia problems Neg Hx     History  Substance Use Topics  . Smoking status: Never Smoker   . Smokeless tobacco: Never Used  . Alcohol Use: No    OB History   Grav Para Term Preterm Abortions TAB SAB Ect Mult Living   4 3   1  1   3        Review of Systems  Unable to perform ROS: Acuity of condition  Allergies  Review of patient's allergies indicates no known allergies.  Home Medications   Prior to Admission medications   Medication Sig Start Date End Date Taking? Authorizing Provider  calcium acetate, Phos Binder, (PHOSLYRA) 667 MG/5ML SOLN Take 1,334 mg by mouth 3 (three) times daily with meals.    Historical Provider, MD  calcium carbonate (TUMS EX) 750 MG chewable tablet Chew 3 tablets by mouth daily. 1000 mg each    Historical Provider, MD  cholecalciferol (VITAMIN D) 1000 UNITS tablet Take 1,000 Units by mouth daily.    Historical Provider, MD  hydroxychloroquine (PLAQUENIL) 200 MG tablet Take 200 mg by mouth daily.     Historical Provider, MD  levETIRAcetam (KEPPRA) 250 MG tablet Take 250 mg by mouth every Tuesday, Thursday, and Saturday at 6 PM.     Historical Provider, MD  levETIRAcetam (KEPPRA)  500 MG tablet Take 500 mg by mouth 2 (two) times daily.    Historical Provider, MD  lidocaine-prilocaine (EMLA) cream Apply 1 application topically as needed (before dialysis).    Historical Provider, MD  nystatin (MYCOSTATIN) 100000 UNIT/ML suspension Take 5 mLs by mouth 3 (three) times daily.    Historical Provider, MD  oxyCODONE-acetaminophen (PERCOCET/ROXICET) 5-325 MG per tablet Take 1 tablet by mouth every 4 (four) hours as needed for severe pain. [id number]   Delora Fuel, MD  sulfamethoxazole-trimethoprim (BACTRIM DS) 800-160 MG per tablet Take 1 tablet by mouth 2 (two) times daily.    Historical Provider, MD   BP 104/94  Pulse 87  Temp(Src) 98.4 F (36.9 C) (Oral)  Resp 24  Ht 5\' 6"  (1.676 m)  Wt 123 lb 7.3 oz (56 kg)  BMI 19.94 kg/m2  SpO2 100%  Physical Exam  Nursing note and vitals reviewed. Constitutional: She is oriented to person, place, and time. She appears well-developed and well-nourished. No distress.  HENT:  Head: Normocephalic and atraumatic.  Eyes: EOM are normal. Pupils are equal, round, and reactive to light.  Neck: Normal range of motion.  Cardiovascular: Normal rate, regular rhythm and normal heart sounds.   Pulmonary/Chest: Effort normal and breath sounds normal.  Abdominal: Soft. She exhibits no distension. There is no tenderness.  Musculoskeletal: Normal range of motion.  Neurological: She is alert and oriented to person, place, and time. She has normal strength. She displays no tremor. No cranial nerve deficit. She exhibits normal muscle tone. She displays no seizure activity. Coordination normal.  Skin: Skin is warm and dry.    ED Course  Procedures (including critical care time)  DIAGNOSTIC STUDIES: Oxygen Saturation is 100% on room air, normal by my interpretation.    COORDINATION OF CARE: 9:24 PM-Discussed treatment plan which includes IV fluids, pain medication, and head CT with pt's family at bedside and they agreed to plan.     Labs  Review Labs Reviewed - No data to display   Imaging Review Ct Head Wo Contrast  10/08/2013   CLINICAL DATA:  Severe headache.  History of lupus.  EXAM: CT HEAD WITHOUT CONTRAST  TECHNIQUE: Contiguous axial images were obtained from the base of the skull through the vertex without intravenous contrast.  COMPARISON:  CT of the head performed 06/16/2013  FINDINGS: There is no evidence of acute infarction, mass lesion, or intra- or extra-axial hemorrhage on CT.  The posterior fossa, including the cerebellum, brainstem and fourth ventricle, is within normal limits. The third and lateral ventricles, and basal ganglia are unremarkable in appearance. The cerebral hemispheres are symmetric in appearance, with normal  gray-white differentiation. No mass effect or midline shift is seen.  There is no evidence of fracture; visualized osseous structures are unremarkable in appearance. The orbits are within normal limits. The paranasal sinuses and mastoid air cells are well-aerated. No significant soft tissue abnormalities are seen.  IMPRESSION: Unremarkable noncontrast CT of the head.   Electronically Signed   By: Garald Balding M.D.   On: 10/08/2013 21:52  I personally reviewed the imaging tests through PACS system I reviewed available ER/hospitalization records through the EMR    EKG Interpretation None      MDM   Final diagnoses:  Headache  Dental infection    11:27 PM Headache is improved.  Patient feels much better at this time.  No unilateral arm or leg weakness.  May represent migraine-like headache.  She does appear to have an infected left lower first molar.  She was started on penicillin.  I do not believe this is necessarily related to her headache.  Home with pain medication and antibiotics.  She understands return to the ER for new or worsening symptoms.    I personally performed the services described in this documentation, which was scribed in my presence. The recorded information has  been reviewed and is accurate.      Hoy Morn, MD 10/08/13 2329

## 2013-10-08 NOTE — Discharge Instructions (Signed)
Enfermedad periodontal (Periodontal Disease) La enfermedad periodontal o enfermedad de las encas, es un tipo de enfermedad bucal que afecta los tejidos que rodean y [employer] Incluye a las encas (gingiva), ligamentos y cavidad dental (hueso alveolar). La enfermedad periodontal puede afectar a uno o a varios dientes. Si no se trata, puede causar la prdida del diente.  CAUSAS La causa principal de la enfermedad periodontal es la placa dental, que contiene bacterias nocivas. Estas bacterias hacen que las encas se inflamen y se infecten. El avance de la enfermedad puede daar los otros tejidos de sostn.  FACTORES DE RIESGO  Diabetes.   El hbito de fumar y el uso del tabaco.   Factores genticos.   Los cambios hormonales en la pubertad, la menopausia y Water quality scientist.   El estrs.   Apretar o rechinar los dientes.   Consumo de drogas.  Dficit nutricional.   Las enfermedades que interfieren en el sistema inmunolgico del organismo.   Algunos medicamentos. SIGNOS Y SNTOMAS  Encas rojas o hinchadas.  Mal aliento que no desaparece.  Encas que se retraen [employer].  Encas que sangran fcilmente.  Prdida o daos en los dientes permanentes.  Dolor al [employer].  Cambios en la forma de ensamble de los dientes.  Dientes sensibles. DIAGNSTICO  El dentista har un examen exhaustivo de los tejidos periodontales. Es posible que le indique radiografas. La evaluacin de la historia clnica ser necesaria para ver si existen otros factores o enfermedades subyacentes que puedan contribuir a la enfermedad. TRATAMIENTO El nmero y tipo de tratamiento depender de la extensin de la enfermedad. El tratamiento puede incluir slo el cepillado y el uso del hilo dental. Si la enfermedad avanza, podrn ser necesarios el raspado y el alisado radicular, y [place]. El objetivo principal es controlar la infeccin. Una buena higiene bucal en el hogar es  necesaria para el xito de todo tipo de [place]. INSTRUCCIONES PARA EL CUIDADO EN EL HOGAR   Mantenga una buena higiene bucal. Esto incluye el uso del hilo dental y el cepillado diario.   Visite a su dentista con regularidad, al menos 2 veces por ao.   Si fuma, abandone el hbito.  Consuma una dieta bien balanceada. SOLICITE ATENCIN ODONTOLGICA DE INMEDIATO SI:   Tiene signos y sntomas de enfermedad periodontal junto con:  Hinchazn de la cara, el cuello o la [place].  No puede abrir Equities trader.  Dolor intenso que no puede controlar con los [employer].  Tiene fiebre o sntomas persistentes durante ms de 2 - 3 das.  Tiene fiebre y los sntomas empeoran repentinamente. Document Released: 05/05/2005 Document Revised: 02/23/2013 [facility] Patient Information 2014 [place], [place].

## 2013-10-08 NOTE — ED Notes (Signed)
Pt arrives holding her head, states severe headache x 1 hour.  Per family, patient has Lupus and the last time she had a severe headache like this it was a Lupus flare up. Pt had dialysis this morning and has also had a bad toothache today.

## 2013-10-12 MED FILL — Oxycodone w/ Acetaminophen Tab 5-325 MG: ORAL | Qty: 6 | Status: AC

## 2014-01-05 ENCOUNTER — Observation Stay (HOSPITAL_COMMUNITY): Payer: Medicaid Other

## 2014-01-05 ENCOUNTER — Observation Stay (HOSPITAL_COMMUNITY)
Admission: EM | Admit: 2014-01-05 | Discharge: 2014-01-07 | Disposition: A | Discharge status: Home / Self Care | Payer: Self-pay | Attending: Internal Medicine | Admitting: Internal Medicine

## 2014-01-05 ENCOUNTER — Encounter (HOSPITAL_COMMUNITY): Payer: Self-pay | Admitting: Emergency Medicine

## 2014-01-05 ENCOUNTER — Emergency Department (HOSPITAL_COMMUNITY): Payer: Self-pay

## 2014-01-05 DIAGNOSIS — R112 Nausea with vomiting, unspecified: Secondary | ICD-10-CM

## 2014-01-05 DIAGNOSIS — Z79899 Other long term (current) drug therapy: Secondary | ICD-10-CM | POA: Insufficient documentation

## 2014-01-05 DIAGNOSIS — N898 Other specified noninflammatory disorders of vagina: Secondary | ICD-10-CM

## 2014-01-05 DIAGNOSIS — Z992 Dependence on renal dialysis: Secondary | ICD-10-CM | POA: Insufficient documentation

## 2014-01-05 DIAGNOSIS — D696 Thrombocytopenia, unspecified: Secondary | ICD-10-CM | POA: Insufficient documentation

## 2014-01-05 DIAGNOSIS — R51 Headache: Secondary | ICD-10-CM | POA: Insufficient documentation

## 2014-01-05 DIAGNOSIS — D649 Anemia, unspecified: Secondary | ICD-10-CM | POA: Insufficient documentation

## 2014-01-05 DIAGNOSIS — I509 Heart failure, unspecified: Secondary | ICD-10-CM | POA: Insufficient documentation

## 2014-01-05 DIAGNOSIS — K8020 Calculus of gallbladder without cholecystitis without obstruction: Secondary | ICD-10-CM | POA: Insufficient documentation

## 2014-01-05 DIAGNOSIS — B3731 Acute candidiasis of vulva and vagina: Secondary | ICD-10-CM | POA: Diagnosis present

## 2014-01-05 DIAGNOSIS — R079 Chest pain, unspecified: Principal | ICD-10-CM

## 2014-01-05 DIAGNOSIS — Z8701 Personal history of pneumonia (recurrent): Secondary | ICD-10-CM | POA: Insufficient documentation

## 2014-01-05 DIAGNOSIS — M7989 Other specified soft tissue disorders: Secondary | ICD-10-CM

## 2014-01-05 DIAGNOSIS — Z8541 Personal history of malignant neoplasm of cervix uteri: Secondary | ICD-10-CM | POA: Insufficient documentation

## 2014-01-05 DIAGNOSIS — B373 Candidiasis of vulva and vagina: Secondary | ICD-10-CM

## 2014-01-05 DIAGNOSIS — N186 End stage renal disease: Secondary | ICD-10-CM | POA: Diagnosis present

## 2014-01-05 DIAGNOSIS — R809 Proteinuria, unspecified: Secondary | ICD-10-CM

## 2014-01-05 DIAGNOSIS — I160 Hypertensive urgency: Secondary | ICD-10-CM

## 2014-01-05 DIAGNOSIS — M329 Systemic lupus erythematosus, unspecified: Secondary | ICD-10-CM

## 2014-01-05 DIAGNOSIS — R109 Unspecified abdominal pain: Secondary | ICD-10-CM

## 2014-01-05 DIAGNOSIS — I3139 Other pericardial effusion (noninflammatory): Secondary | ICD-10-CM

## 2014-01-05 DIAGNOSIS — R1030 Lower abdominal pain, unspecified: Secondary | ICD-10-CM | POA: Diagnosis present

## 2014-01-05 DIAGNOSIS — R519 Headache, unspecified: Secondary | ICD-10-CM

## 2014-01-05 DIAGNOSIS — J189 Pneumonia, unspecified organism: Secondary | ICD-10-CM

## 2014-01-05 DIAGNOSIS — R220 Localized swelling, mass and lump, head: Secondary | ICD-10-CM

## 2014-01-05 DIAGNOSIS — D631 Anemia in chronic kidney disease: Secondary | ICD-10-CM | POA: Diagnosis present

## 2014-01-05 DIAGNOSIS — G039 Meningitis, unspecified: Secondary | ICD-10-CM

## 2014-01-05 DIAGNOSIS — K859 Acute pancreatitis without necrosis or infection, unspecified: Secondary | ICD-10-CM | POA: Diagnosis present

## 2014-01-05 DIAGNOSIS — G40909 Epilepsy, unspecified, not intractable, without status epilepticus: Secondary | ICD-10-CM | POA: Diagnosis present

## 2014-01-05 DIAGNOSIS — N058 Unspecified nephritic syndrome with other morphologic changes: Secondary | ICD-10-CM | POA: Insufficient documentation

## 2014-01-05 DIAGNOSIS — I313 Pericardial effusion (noninflammatory): Secondary | ICD-10-CM

## 2014-01-05 DIAGNOSIS — K858 Other acute pancreatitis without necrosis or infection: Secondary | ICD-10-CM

## 2014-01-05 DIAGNOSIS — R0602 Shortness of breath: Secondary | ICD-10-CM | POA: Insufficient documentation

## 2014-01-05 DIAGNOSIS — I120 Hypertensive chronic kidney disease with stage 5 chronic kidney disease or end stage renal disease: Secondary | ICD-10-CM | POA: Insufficient documentation

## 2014-01-05 HISTORY — DX: Pneumonia, unspecified organism: J18.9

## 2014-01-05 HISTORY — DX: Epilepsy, unspecified, not intractable, without status epilepticus: G40.909

## 2014-01-05 LAB — COMPREHENSIVE METABOLIC PANEL
ALT: 8 U/L (ref 0–35)
AST: 14 U/L (ref 0–37)
Albumin: 4.1 g/dL (ref 3.5–5.2)
Alkaline Phosphatase: 86 U/L (ref 39–117)
BUN: 51 mg/dL — ABNORMAL HIGH (ref 6–23)
CALCIUM: 10.9 mg/dL — AB (ref 8.4–10.5)
CO2: 24 mEq/L (ref 19–32)
CREATININE: 9.71 mg/dL — AB (ref 0.50–1.10)
Chloride: 91 mEq/L — ABNORMAL LOW (ref 96–112)
GFR calc non Af Amer: 5 mL/min — ABNORMAL LOW (ref >90)
GFR, EST AFRICAN AMERICAN: 5 mL/min — AB (ref >90)
Glucose, Bld: 83 mg/dL (ref 70–99)
Potassium: 5.1 mEq/L (ref 3.7–5.3)
Sodium: 138 mEq/L (ref 137–147)
Total Bilirubin: 0.4 mg/dL (ref 0.3–1.2)
Total Protein: 7.7 g/dL (ref 6.0–8.3)

## 2014-01-05 LAB — TROPONIN I
Troponin I: <0.3 ng/mL (ref <0.30)
Troponin I: <0.3 ng/mL (ref <0.30)
Troponin I: <0.3 ng/mL (ref <0.30)

## 2014-01-05 LAB — CBC WITH DIFFERENTIAL/PLATELET
Basophils Absolute: 0 10*3/uL (ref 0.0–0.1)
Basophils Relative: 1 % (ref 0–1)
EOS PCT: 3 % (ref 0–5)
Eosinophils Absolute: 0.1 10*3/uL (ref 0.0–0.7)
HCT: 32.9 % — ABNORMAL LOW (ref 36.0–46.0)
Hemoglobin: 11 g/dL — ABNORMAL LOW (ref 12.0–15.0)
LYMPHS ABS: 1.2 10*3/uL (ref 0.7–4.0)
LYMPHS PCT: 24 % (ref 12–46)
MCH: 32 pg (ref 26.0–34.0)
MCHC: 33.4 g/dL (ref 30.0–36.0)
MCV: 95.6 fL (ref 78.0–100.0)
MONOS PCT: 13 % — AB (ref 3–12)
Monocytes Absolute: 0.6 10*3/uL (ref 0.1–1.0)
Neutro Abs: 2.9 10*3/uL (ref 1.7–7.7)
Neutrophils Relative %: 59 % (ref 43–77)
Platelets: 208 10*3/uL (ref 150–400)
RBC: 3.44 MIL/uL — ABNORMAL LOW (ref 3.87–5.11)
RDW: 14.2 % (ref 11.5–15.5)
WBC: 4.9 10*3/uL (ref 4.0–10.5)

## 2014-01-05 LAB — LIPID PANEL
CHOL/HDL RATIO: 2.5 ratio
CHOLESTEROL: 204 mg/dL — AB (ref 0–200)
HDL: 83 mg/dL (ref >39)
LDL Cholesterol: 92 mg/dL (ref 0–99)
TRIGLYCERIDES: 144 mg/dL (ref <150)
VLDL: 29 mg/dL (ref 0–40)

## 2014-01-05 LAB — TSH: TSH: 1.68 u[IU]/mL (ref 0.350–4.500)

## 2014-01-05 LAB — LIPASE, BLOOD: Lipase: 125 U/L — ABNORMAL HIGH (ref 11–59)

## 2014-01-05 LAB — D-DIMER, QUANTITATIVE: D-Dimer, Quant: <0.27 ug/mL-FEU (ref 0.00–0.48)

## 2014-01-05 LAB — HCG, QUANTITATIVE, PREGNANCY: hCG, Beta Chain, Quant, S: <1 m[IU]/mL (ref <5)

## 2014-01-05 MED ORDER — PANTOPRAZOLE SODIUM 40 MG IV SOLR
40.0000 mg | INTRAVENOUS | Status: DC
  Administered 2014-01-05 – 2014-01-07 (×3): 40 mg via INTRAVENOUS
  Filled 2014-01-05 (×3): qty 40

## 2014-01-05 MED ORDER — ONDANSETRON HCL 4 MG/2ML IJ SOLN: 4.0000 mg | Freq: Once | INTRAMUSCULAR | Status: AC

## 2014-01-05 MED ORDER — HEPARIN SODIUM (PORCINE) 1000 UNIT/ML DIALYSIS
1000.0000 [IU] | INTRAMUSCULAR | Status: DC | PRN
  Filled 2014-01-05: qty 1

## 2014-01-05 MED ORDER — LIDOCAINE HCL (PF) 1 % IJ SOLN: 5.0000 mL | INTRAMUSCULAR | Status: DC | PRN

## 2014-01-05 MED ORDER — ACETAMINOPHEN 325 MG PO TABS
650.0000 mg | ORAL_TABLET | Freq: Four times a day (QID) | ORAL | Status: DC | PRN
  Administered 2014-01-05 – 2014-01-06 (×2): 650 mg via ORAL
  Filled 2014-01-05 (×2): qty 2

## 2014-01-05 MED ORDER — METOCLOPRAMIDE HCL 5 MG/ML IJ SOLN
10.0000 mg | Freq: Once | INTRAMUSCULAR | Status: AC
  Administered 2014-01-05: 10 mg via INTRAVENOUS
  Filled 2014-01-05: qty 2

## 2014-01-05 MED ORDER — HYDROXYCHLOROQUINE SULFATE 200 MG PO TABS
200.0000 mg | ORAL_TABLET | Freq: Every day | ORAL | Status: DC
  Administered 2014-01-06 – 2014-01-07 (×2): 200 mg via ORAL
  Filled 2014-01-05 (×4): qty 1

## 2014-01-05 MED ORDER — ALTEPLASE 2 MG IJ SOLR
2.0000 mg | Freq: Once | INTRAMUSCULAR | Status: DC | PRN
  Filled 2014-01-05: qty 2

## 2014-01-05 MED ORDER — CALCIUM CARBONATE ANTACID 500 MG PO CHEW: 3.0000 | CHEWABLE_TABLET | Freq: Every day | ORAL | Status: DC

## 2014-01-05 MED ORDER — LEVETIRACETAM 500 MG PO TABS
500.0000 mg | ORAL_TABLET | Freq: Two times a day (BID) | ORAL | Status: DC
  Administered 2014-01-05 – 2014-01-07 (×5): 500 mg via ORAL
  Filled 2014-01-05 (×5): qty 1

## 2014-01-05 MED ORDER — AZATHIOPRINE 50 MG PO TABS
50.0000 mg | ORAL_TABLET | Freq: Every day | ORAL | Status: DC
  Administered 2014-01-06 – 2014-01-07 (×2): 50 mg via ORAL
  Filled 2014-01-05 (×4): qty 1

## 2014-01-05 MED ORDER — SODIUM CHLORIDE 0.9 % IV SOLN: 100.0000 mL | INTRAVENOUS | Status: DC | PRN

## 2014-01-05 MED ORDER — HEPARIN SODIUM (PORCINE) 5000 UNIT/ML IJ SOLN
5000.0000 [IU] | Freq: Three times a day (TID) | INTRAMUSCULAR | Status: DC
  Administered 2014-01-05 – 2014-01-07 (×7): 5000 [IU] via SUBCUTANEOUS
  Filled 2014-01-05 (×7): qty 1

## 2014-01-05 MED ORDER — ONDANSETRON HCL 4 MG PO TABS: 4.0000 mg | ORAL_TABLET | Freq: Four times a day (QID) | ORAL | Status: DC | PRN

## 2014-01-05 MED ORDER — SODIUM CHLORIDE 0.9 % IV SOLN: INTRAVENOUS | Status: DC

## 2014-01-05 MED ORDER — LIDOCAINE-PRILOCAINE 2.5-2.5 % EX CREA: 1.0000 "application " | TOPICAL_CREAM | CUTANEOUS | Status: DC | PRN

## 2014-01-05 MED ORDER — LEVETIRACETAM 250 MG PO TABS
250.0000 mg | ORAL_TABLET | ORAL | Status: DC
  Administered 2014-01-05 – 2014-01-07 (×2): 250 mg via ORAL
  Filled 2014-01-05 (×2): qty 1

## 2014-01-05 MED ORDER — NEPRO/CARBSTEADY PO LIQD
237.0000 mL | ORAL | Status: DC | PRN
  Filled 2014-01-05: qty 237

## 2014-01-05 MED ORDER — DIPHENHYDRAMINE HCL 50 MG/ML IJ SOLN
25.0000 mg | Freq: Once | INTRAMUSCULAR | Status: AC
  Administered 2014-01-05: 25 mg via INTRAVENOUS
  Filled 2014-01-05: qty 1

## 2014-01-05 MED ORDER — ONDANSETRON HCL 4 MG/2ML IJ SOLN
4.0000 mg | Freq: Once | INTRAMUSCULAR | Status: AC
  Filled 2014-01-05: qty 2

## 2014-01-05 MED ORDER — PENTAFLUOROPROP-TETRAFLUOROETH EX AERO
1.0000 "application " | INHALATION_SPRAY | CUTANEOUS | Status: DC | PRN
  Filled 2014-01-05: qty 30

## 2014-01-05 MED ORDER — ALUM & MAG HYDROXIDE-SIMETH 200-200-20 MG/5ML PO SUSP: 30.0000 mL | Freq: Four times a day (QID) | ORAL | Status: DC | PRN

## 2014-01-05 MED ORDER — CALCIUM ACETATE 667 MG PO CAPS: 1334.0000 mg | ORAL_CAPSULE | Freq: Three times a day (TID) | ORAL | Status: DC

## 2014-01-05 MED ORDER — ACETAMINOPHEN 650 MG RE SUPP: 650.0000 mg | Freq: Four times a day (QID) | RECTAL | Status: DC | PRN

## 2014-01-05 MED ORDER — VITAMIN D 1000 UNITS PO TABS
1000.0000 [IU] | ORAL_TABLET | Freq: Every day | ORAL | Status: DC
  Administered 2014-01-06 – 2014-01-07 (×2): 1000 [IU] via ORAL
  Filled 2014-01-05 (×2): qty 1

## 2014-01-05 MED ORDER — SODIUM CHLORIDE 0.9 % IV BOLUS (SEPSIS)
1000.0000 mL | Freq: Once | INTRAVENOUS | Status: AC
  Administered 2014-01-05: 250 mL via INTRAVENOUS

## 2014-01-05 MED ORDER — ALBUTEROL SULFATE (2.5 MG/3ML) 0.083% IN NEBU: 2.5000 mg | INHALATION_SOLUTION | RESPIRATORY_TRACT | Status: DC | PRN

## 2014-01-05 MED ORDER — ASPIRIN 81 MG PO CHEW
324.0000 mg | CHEWABLE_TABLET | Freq: Once | ORAL | Status: AC
  Administered 2014-01-05: 324 mg via ORAL
  Filled 2014-01-05: qty 4

## 2014-01-05 MED ORDER — MORPHINE SULFATE 2 MG/ML IJ SOLN: 2.0000 mg | INTRAMUSCULAR | Status: DC | PRN

## 2014-01-05 MED ORDER — ONDANSETRON HCL 4 MG/2ML IJ SOLN: 4.0000 mg | Freq: Four times a day (QID) | INTRAMUSCULAR | Status: DC | PRN

## 2014-01-05 MED ORDER — ONDANSETRON HCL 4 MG/2ML IJ SOLN
4.0000 mg | Freq: Once | INTRAMUSCULAR | Status: AC
  Administered 2014-01-05: 4 mg via INTRAMUSCULAR

## 2014-01-05 NOTE — Procedures (Signed)
   HEMODIALYSIS TREATMENT NOTE:  4 hour heparin-free dialysis completed via right upper arm AVF (15g ante/retrograde).  Goal NOT met:  BP unable to tolerate ultrafiltration of 2.5 liters as ordered.  UF interrupted x 60 minutes due to SBP<90 (asymptomatic).  Net UF = 1.8 liters.  All blood was reinfused and hemostasis was achieved within 15 minutes.  Report given to Linus Galas, RN.  Jeanclaude Wentworth L. Chrisean Kloth, RN, CDN

## 2014-01-05 NOTE — ED Notes (Signed)
Pain across chest that started last night, states is worse with movement.

## 2014-01-05 NOTE — ED Notes (Signed)
Patient states mid-center chest pain that woke her up from her sleep. Patient states nausea and vomiting X2. Patient is alert and oriented X4, family at bedside, no needs voiced.

## 2014-01-05 NOTE — ED Provider Notes (Signed)
CSN: [id number]     Arrival date & time 01/05/14  0431 History   First MD Initiated Contact with Patient 01/05/14 0505     Chief Complaint  Patient presents with  . Chest Pain     (Consider location/radiation/quality/duration/timing/severity/associated sxs/prior Treatment) HPI Comments: Patient presents with left-sided chest pain that onset last night has been coming and going. Pain lasts for 15-20 minutes at a time per patient. It does not radiate. It started after she had 2 episodes of vomiting. There is no shortness of breath. Is associated with nausea. No diaphoresis. Patient does not think she has ever had a stress test. Nothing makes the pain better or worse. She denies any cough or fever. She is a dialysis patient with history of lupus, CHF, hypertension. Last had dialysis yesterday.  Patient also endorses diffuse headache that onset 2 days ago. Denies thunderclap onset. Denies fever, focal weakness, numbness or tingling.  The history is provided by the patient and the spouse. The history is limited by a language barrier.    Past Medical History  Diagnosis Date  . Gallstone pancreatitis Feb 2011  . Cervix carcinoma in situ Mar 2011  . Proteinuria - cause not known   . Hypertension   . Lupus   . CHF (congestive heart failure)   . Anemia   . Thrombocytopenia   . Blood transfusion   . Dialysis patient     tues, thursday, saturday.  . Renal failure, acute on chronic   . Chronic kidney disease     T, Th, Sat  . Lupus nephritis   . Renal insufficiency   . Seizure disorder 03/19/2013  . Pneumonia due to organism 03/02/2011   Past Surgical History  Procedure Laterality Date  . Appendectomy    . Cervical cone biopsy    . Cesarean section    . Cholecystectomy    . Embolectomy  10/10/2011    Procedure: EMBOLECTOMY;  Surgeon: Angelia Mould, MD;  Location: [place];  Service: Vascular;  Laterality: Right;  . Av fistula placement    . Av fistula placement  07/18/2011   Procedure: ARTERIOVENOUS (AV) FISTULA CREATION;  Surgeon: Angelia Mould, MD;  Location: [place];  Service: Vascular;  Laterality: Right;  . Av fistula placement  01/23/12    Revision of Right AVF   Family History  Problem Relation Age of Onset  . Diabetes Mother   . Diabetes Brother   . Diabetes Brother   . Diabetes Maternal Grandmother   . Anesthesia problems Neg Hx    History  Substance Use Topics  . Smoking status: Never Smoker   . Smokeless tobacco: Never Used  . Alcohol Use: No   OB History   Grav Para Term Preterm Abortions TAB SAB Ect Mult Living   4 3   1  1   3      Review of Systems  Constitutional: Negative for fever, activity change and appetite change.  HENT: Negative for congestion and rhinorrhea.   Eyes: Negative for visual disturbance.  Respiratory: Positive for chest tightness and shortness of breath.   Cardiovascular: Positive for chest pain.  Gastrointestinal: Negative for nausea, vomiting and abdominal pain.  Genitourinary: Negative for hematuria.  Musculoskeletal: Negative for arthralgias, back pain and myalgias.  Neurological: Negative for dizziness, weakness and headaches.  A complete 10 system review of systems was obtained and all systems are negative except as noted in the HPI and PMH.      Allergies  Review of  patient's allergies indicates no known allergies.  Home Medications   Prior to Admission medications   Medication Sig Start Date End Date Taking? Authorizing Provider  azaTHIOprine (IMURAN) 50 MG tablet Take 50 mg by mouth daily. 07/29/13 07/29/14 Yes Historical Provider, MD  calcium carbonate (TUMS EX) 750 MG chewable tablet Chew 3 tablets by mouth daily.    Yes Historical Provider, MD  cholecalciferol (VITAMIN D) 1000 UNITS tablet Take 1,000 Units by mouth daily.   Yes Historical Provider, MD  hydroxychloroquine (PLAQUENIL) 200 MG tablet Take 200 mg by mouth daily.    Yes Historical Provider, MD  levETIRAcetam (KEPPRA) 500 MG tablet  Take 500 mg by mouth 2 (two) times daily.   Yes Historical Provider, MD  lidocaine-prilocaine (EMLA) cream Apply 1 application topically as needed (before dialysis).   Yes Historical Provider, MD  calcium acetate, Phos Binder, (PHOSLYRA) 667 MG/5ML SOLN Take 1,334 mg by mouth 3 (three) times daily with meals.    Historical Provider, MD  levETIRAcetam (KEPPRA) 250 MG tablet Take 250 mg by mouth every Tuesday, Thursday, and Saturday at 6 PM.     Historical Provider, MD  oxyCODONE-acetaminophen (PERCOCET/ROXICET) 5-325 MG per tablet Take 1 tablet by mouth every 4 (four) hours as needed for severe pain. 10/08/13   Hoy Morn, MD   BP 110/78  Pulse 70  Temp(Src) 97.8 F (36.6 C) (Oral)  Resp 20  Ht 5\' 6"  (1.676 m)  Wt 145 lb 8.1 oz (66 kg)  BMI 23.50 kg/m2  SpO2 100%  LMP 01/05/2014 Physical Exam  Nursing note and vitals reviewed. Constitutional: She is oriented to person, place, and time. She appears well-developed and well-nourished. No distress.  Uncomfortable  HENT:  Head: Normocephalic and atraumatic.  Mouth/Throat: Oropharynx is clear and moist. No oropharyngeal exudate.  Eyes: Conjunctivae and EOM are normal. Pupils are equal, round, and reactive to light.  Neck: Normal range of motion. Neck supple.  No meningismus.  Cardiovascular: Normal rate, regular rhythm, normal heart sounds and intact distal pulses.   No murmur heard. Pulmonary/Chest: Effort normal and breath sounds normal. No respiratory distress. She exhibits no tenderness.   chest wall is sore but does not reproduce patient's pain   Abdominal: Soft. There is no tenderness. There is no rebound and no guarding.  Musculoskeletal: Normal range of motion. She exhibits no edema and no tenderness.  RUE fistula with thrill and bruit  Neurological: She is alert and oriented to person, place, and time. No cranial nerve deficit. She exhibits normal muscle tone. Coordination normal.  No ataxia on finger to nose bilaterally. No  pronator drift. 5/5 strength throughout. CN 2-12 intact. Negative Romberg. Equal grip strength. Sensation intact. Gait is normal.   Skin: Skin is warm.  Psychiatric: She has a normal mood and affect. Her behavior is normal.    ED Course  Procedures (including critical care time) Labs Review Labs Reviewed  CBC WITH DIFFERENTIAL - Abnormal; Notable for the following:    RBC 3.44 (*)    Hemoglobin 11.0 (*)    HCT 32.9 (*)    Monocytes Relative 13 (*)    All other components within normal limits  COMPREHENSIVE METABOLIC PANEL - Abnormal; Notable for the following:    Chloride 91 (*)    BUN 51 (*)    Creatinine, Ser 9.71 (*)    Calcium 10.9 (*)    GFR calc non Af Amer 5 (*)    GFR calc Af Amer 5 (*)    All  other components within normal limits  LIPID PANEL - Abnormal; Notable for the following:    Cholesterol 204 (*)    All other components within normal limits  TROPONIN I  HCG, QUANTITATIVE, PREGNANCY  TSH  TROPONIN I  TROPONIN I  TROPONIN I  LIPASE, BLOOD    Imaging Review Ct Head Wo Contrast  01/05/2014   CLINICAL DATA:  Headache, history of cervical cancer. History of renal failure, on dialysis.  EXAM: CT HEAD WITHOUT CONTRAST  TECHNIQUE: Contiguous axial images were obtained from the base of the skull through the vertex without intravenous contrast.  COMPARISON:  CT of the head Oct 08, 2013  FINDINGS: The ventricles and sulci are normal. No intraparenchymal hemorrhage, mass effect nor midline shift. No acute large vascular territory infarcts.  No abnormal extra-axial fluid collections. Basal cisterns are patent.  No skull fracture. The included ocular globes and orbital contents are non-suspicious. The mastoid aircells and included paranasal sinuses are well-aerated.  IMPRESSION: No acute intracranial process.  Normal noncontrast CT of the head.   Electronically Signed   By: Elon Alas   On: 01/05/2014 06:34   Dg Chest Portable 1 View  01/05/2014   CLINICAL DATA:  Chest  pain and headache  EXAM: PORTABLE CHEST - 1 VIEW  COMPARISON:  03/19/2013  FINDINGS: The heart size has normalized since previous, likely decreased or resolved pericardial effusion. Negative upper mediastinal contours. There is no edema, consolidation, effusion, or pneumothorax.  IMPRESSION: No active disease.   Electronically Signed   By: Jorje Guild M.D.   On: 01/05/2014 05:35     EKG Interpretation   Date/Time:  Thursday January 05 2014 04:52:36 EDT Ventricular Rate:  66 PR Interval:  144 QRS Duration: 86 QT Interval:  432 QTC Calculation: 453 R Axis:   72 Text Interpretation:  Sinus rhythm No significant change was found  Confirmed by Wyvonnia Dusky  MD, Soumya Colson ([id number]) on 01/05/2014 5:04:14 AM      MDM   Final diagnoses:  Chest pain, unspecified chest pain type  Headache, unspecified headache type  ESRD (end stage renal disease)   Intermittent left-sided chest pain since yesterday lasting 20 minutes at a time. EKG nsr  Pain is not reproducible.  Record review shows patient was admitted to [facility] in November 2014 with new onset seizures and altered mental status. 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  Through translator, patient states the headache is constant and similar to what she's had in the past. It is gotten worse over the past week. No fever. She had a similar visit in May was attributed to migraines. She says the headache feels similar to that. She denies thunderclap onset.  She is unclear about her chest pain. The pain is somewhat reproducible but also exertional and associated with shortness of breath. Husband reports patient had a stress test 2 years ago but I do not find his records through Epic  Given risk factors, will admit for chest pain rule out. Seems atypical for ACS but history is questionable.  Doubt PE.  No hypoxia or tachycardia.    EMERGENCY DEPARTMENT [place] CARDIAC EXAM "Study: Limited Ultrasound of the heart and  pericardium"  INDICATIONS:Dyspnea Multiple views of the heart and pericardium are obtained with a multi-frequency probe.  PERFORMED [id number]  IMAGES ARCHIVED?: No  FINDINGS: No pericardial effusion, Normal contractility and Tamponade physiology absent  LIMITATIONS:  Body habitus  VIEWS USED: Subcostal 4 chamber and Parasternal long axis  INTERPRETATION:  Cardiac activity present, Pericardial effusioin absent, Cardiac tamponade absent and Normal contractility  COMMENT:     Ezequiel Essex, MD 01/05/14 [phone]

## 2014-01-05 NOTE — H&P (Signed)
[facility] History and Physical  Maria Vazquez [id number] DOB: 20-Dec-1975 DOA: 01/05/2014  Referring physician: ED physician, Dr. Wyvonnia Dusky PCP: Estanislado Emms, MD   Chief Complaint:Left-sided chest pain; nausea/vomiting; lower abdominal pain.  HPI: Maria Vazquez is a 38 y.o. 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 Her chest pain started last night and continued intermittently until this morning. At the time that the pain occurred, she was at rest. The pain is located on the left side of her chest. She describes the pain as sharp. It is moderate in intensity. Prior to the chest pain, she had 2 episodes of nausea and vomiting with associated lower abdominal pain, but no diarrhea. She denies coffee grounds emesis. The chest pain started after the vomiting. It was not associated with radiation of the pain, cough, or diaphoresis. She denies pleurisy or any focal or bilateral swelling in her legs. She denies recent travel. She did have mild shortness of breath. She has had no diarrhea. Her last bowel movement was yesterday and was considered normal. She does not know if the chicken and rice she ate last night made her "sick". However, no other family members had nausea or vomiting. Later in the questioning, she admits to being on her menstrual period. She has also had some mild vaginal itching and a yellow vaginal discharge. She denies pain with sexual intercourse. (She is married). She no longer makes urine. She also complains of a frontal headache without visual changes, facial numbness, or focal unilateral weakness.  In the ED, she is afebrile and hemodynamically stable. Her EKG reveals normal sinus rhythm with a heart rate of 66 beats per minute and no suspicious ST or T wave abnormalities.  Her chest x-ray reveals no active disease. Her troponin I is negative. Her lab data are significant for a hemoglobin of 11.0, BUN of 51, and creatinine of 9.71. CT of her head reveals no acute intracranial process. She is being admitted for further evaluation and management.   Review of Systems:  As above in history present illness, otherwise negative.  Past Medical History  Diagnosis Date  . Gallstone pancreatitis Feb 2011  . Cervix carcinoma in situ Mar 2011  . Proteinuria - cause not known   . Hypertension   . Lupus   . CHF (congestive heart failure)   . Anemia   . Thrombocytopenia   . Blood transfusion   . Dialysis patient     tues, thursday, saturday.  . Renal failure, acute on chronic   . Chronic kidney disease     T, Th, Sat  . Lupus nephritis   . Renal insufficiency   . Seizure disorder 03/19/2013  . Pneumonia due to organism 03/02/2011   Past Surgical History  Procedure Laterality Date  . Appendectomy    . Cervical cone biopsy    . Cesarean section    . Cholecystectomy    . Embolectomy  10/10/2011    Procedure: EMBOLECTOMY;  Surgeon: Angelia Mould, MD;  Location: [place];  Service: Vascular;  Laterality: Right;  . Av fistula placement    . Av fistula placement  07/18/2011    Procedure: ARTERIOVENOUS (AV) FISTULA CREATION;  Surgeon: Angelia Mould, MD;  Location: [place];  Service: Vascular;  Laterality: Right;  . Av fistula placement  01/23/12    Revision of Right AVF  Social History: She is married. She has 3 children. She denies alcohol, tobacco, and illicit drug use.  No Known Allergies  Family History  Problem Relation Age of Onset  . Diabetes Mother   . Diabetes Brother   . Diabetes Brother   . Diabetes Maternal Grandmother   . Anesthesia problems Neg Hx      Prior to Admission medications   Medication Sig Start Date End Date Taking? Authorizing Provider  azaTHIOprine (IMURAN) 50 MG tablet Take 50 mg by mouth daily. 07/29/13 07/29/14 Yes  Historical Provider, MD  calcium carbonate (TUMS EX) 750 MG chewable tablet Chew 3 tablets by mouth daily.    Yes Historical Provider, MD  cholecalciferol (VITAMIN D) 1000 UNITS tablet Take 1,000 Units by mouth daily.   Yes Historical Provider, MD  hydroxychloroquine (PLAQUENIL) 200 MG tablet Take 200 mg by mouth daily.    Yes Historical Provider, MD  levETIRAcetam (KEPPRA) 500 MG tablet Take 500 mg by mouth 2 (two) times daily.   Yes Historical Provider, MD  lidocaine-prilocaine (EMLA) cream Apply 1 application topically as needed (before dialysis).   Yes Historical Provider, MD  calcium acetate, Phos Binder, (PHOSLYRA) 667 MG/5ML SOLN Take 1,334 mg by mouth 3 (three) times daily with meals.    Historical Provider, MD  levETIRAcetam (KEPPRA) 250 MG tablet Take 250 mg by mouth every Tuesday, Thursday, and Saturday at 6 PM.     Historical Provider, MD  oxyCODONE-acetaminophen (PERCOCET/ROXICET) 5-325 MG per tablet Take 1 tablet by mouth every 4 (four) hours as needed for severe pain. 10/08/13   Hoy Morn, MD   Physical Exam: Filed Vitals:   01/05/14 0530 01/05/14 0600 01/05/14 0630 01/05/14 0752  BP: 107/76 113/79 108/60 110/78  Pulse: 58 64  70  Temp:    97.8 F (36.6 C)  TempSrc:    Oral  Resp: 15 17 20    Height:      Weight:      SpO2: 97% 99%  100%    Wt Readings from Last 3 Encounters:  01/05/14 66 kg (145 lb 8.1 oz)  10/08/13 56 kg (123 lb 7.3 oz)  06/15/13 61.236 kg (135 lb)    General:  39 year old [place] woman laying in bed, in no acute distress. Eyes: PERRL, normal lids, irises & conjunctiva; conjunctiva clear and sclerae are white ENT: Oropharynx reveals dry mucous membranes, no posterior exudates or erythema. Neck: no LAD, masses or thyromegaly Cardiovascular: S1, S2, with a soft systolic murmur. No lower extremity edema. Telemetry: SR, no arrhythmias  Respiratory: CTA bilaterally, no w/r/r. Normal respiratory effort. Abdomen: Positive bowel sounds, soft, minimal  tenderness over the suprapubic area, no rebound, no guarding, no distention, no masses. Skin: no rash or induration seen on limited exam Musculoskeletal: Right upper extremity bruit and thrill noted from AV fistula; no other joint abnormalities or upper/lower extremity abnormalities; pedal pulses palpable; no pedal edema; no calf edema or cords palpated. Psychiatric: grossly normal mood and affect, speech fluent and appropriate Neurologic: grossly non-focal.cranial nerves II through XII are grossly intact.           Labs on Admission:  Basic Metabolic Panel:  Recent Labs Lab 01/05/14 0455  NA 138  K 5.1  CL 91*  CO2 24  GLUCOSE 83  BUN 51*  CREATININE 9.71*  CALCIUM 10.9*   Liver Function Tests:  Recent Labs Lab 01/05/14 0455  AST 14  ALT 8  ALKPHOS 86  BILITOT 0.4  PROT 7.7  ALBUMIN 4.1  No results found for this basename: LIPASE, AMYLASE,  in the last 168 hours No results found for this basename: AMMONIA,  in the last 168 hours CBC:  Recent Labs Lab 01/05/14 0455  WBC 4.9  NEUTROABS 2.9  HGB 11.0*  HCT 32.9*  MCV 95.6  PLT 208   Cardiac Enzymes:  Recent Labs Lab 01/05/14 0455  TROPONINI <0.30    BNP (last 3 results) No results found for this basename: PROBNP,  in the last 8760 hours CBG: No results found for this basename: GLUCAP,  in the last 168 hours  Radiological Exams on Admission: Ct Head Wo Contrast  01/05/2014   CLINICAL DATA:  Headache, history of cervical cancer. History of renal failure, on dialysis.  EXAM: CT HEAD WITHOUT CONTRAST  TECHNIQUE: Contiguous axial images were obtained from the base of the skull through the vertex without intravenous contrast.  COMPARISON:  CT of the head Oct 08, 2013  FINDINGS: The ventricles and sulci are normal. No intraparenchymal hemorrhage, mass effect nor midline shift. No acute large vascular territory infarcts.  No abnormal extra-axial fluid collections. Basal cisterns are patent.  No skull fracture.  The included ocular globes and orbital contents are non-suspicious. The mastoid aircells and included paranasal sinuses are well-aerated.  IMPRESSION: No acute intracranial process.  Normal noncontrast CT of the head.   Electronically Signed   By: Elon Alas   On: 01/05/2014 06:34   Dg Chest Portable 1 View  01/05/2014   CLINICAL DATA:  Chest pain and headache  EXAM: PORTABLE CHEST - 1 VIEW  COMPARISON:  03/19/2013  FINDINGS: The heart size has normalized since previous, likely decreased or resolved pericardial effusion. Negative upper mediastinal contours. There is no edema, consolidation, effusion, or pneumothorax.  IMPRESSION: No active disease.   Electronically Signed   By: Jorje Guild M.D.   On: 01/05/2014 05:35    EKG: Independently reviewed. As above in history present illness. Normal sinus rhythm with heart rate of 66 beats per minute.  Assessment/Plan Principal Problem:   Left sided chest pain Active Problems:   Nausea & vomiting   Abdominal pain, lower   Vaginal discharge   Anemia in ESRD (end-stage renal disease)   Lupus (systemic lupus erythematosus)   End stage renal disease   Seizure disorder   1. Left-sided chest pain. The patient's chest pain appears to be atypical and may be associated with GI symptomatology. Her liver transaminases are within normal limits. We'll order a lipase for further evaluation. Of note, she has a history of gallstone pancreatitis, status post cholecystectomy. However, given her history of lupus, PE is a concern, but less likely with no complaint of pleurisy and no hypoxia. Nevertheless, we'll order a d-dimer and if it is positive, would favor a VQ scan for further evaluation. We'll also order a 2-D echocardiogram and cardiac enzymes for further evaluation. She is currently without chest pain. 2. Lower abdominal pain/nausea and vomiting/vaginal itching and discharge. The patient also has a history of cervical carcinoma in situ. She is sexually  active with her husband. For further evaluation, we'll order a pelvic ultrasound and obtain a wet prep. We'll treat her nausea and vomiting with Zofran as needed. We'll add Protonix empirically. Morphine as needed for pain. We'll start a clear liquid diet and hold most of her medications until tomorrow morning. 3. End-stage renal disease. She is due for hemodialysis today. [facility] consult nephrology. 4. Systemic lupus erythematous. We'll continue her chronic medications including plaque with no and  Imuran. 5. Seizure disorder. Currently stable. We'll continue Keppra.     Code Status: Full code DVT Prophylaxis: Subcutaneous heparin Family Communication: Discussed with patient; family not available Disposition Plan: Discharge to home when clinically appropriate  Time spent: One hour and 10 minutes  [facility] Pager [phone]  **Disclaimer: This note may have been dictated with voice recognition software. Similar sounding words can inadvertently be transcribed and this note may contain transcription errors which may not have been corrected upon publication of note.**

## 2014-01-05 NOTE — Consult Note (Signed)
Reason for Consult: End-stage renal disease Referring Physician: Dr. Burton Apley Maria Vazquez is an 38 y.o. female.  HPI: She is a patient who has history of lupus, hypertension and end-stage renal disease on maintenance for dialysis presently came with left-sided chest pain since yesterday. Patient states that she has sharp pain on the left side, aggravated by movement and no radiation. This is also associated with some nausea, vomiting but no diarrhea. Patient also says that she has some headache which comes on and off. Presently she is feeling better. She denies any difficulty in breathing. Her last dialysis was on Tuesday.  Past Medical History  Diagnosis Date  . Gallstone pancreatitis Feb 2011  . Cervix carcinoma in situ Mar 2011  . Proteinuria - cause not known   . Hypertension   . Lupus   . CHF (congestive heart failure)   . Anemia   . Thrombocytopenia   . Blood transfusion   . Dialysis patient     tues, thursday, saturday.  . Renal failure, acute on chronic   . Chronic kidney disease     T, Th, Sat  . Lupus nephritis   . Renal insufficiency   . Seizure disorder 03/19/2013  . Pneumonia due to organism 03/02/2011    Past Surgical History  Procedure Laterality Date  . Appendectomy    . Cervical cone biopsy    . Cesarean section    . Cholecystectomy    . Embolectomy  10/10/2011    Procedure: EMBOLECTOMY;  Surgeon: Angelia Mould, MD;  Location: [place];  Service: Vascular;  Laterality: Right;  . Av fistula placement    . Av fistula placement  07/18/2011    Procedure: ARTERIOVENOUS (AV) FISTULA CREATION;  Surgeon: Angelia Mould, MD;  Location: [place];  Service: Vascular;  Laterality: Right;  . Av fistula placement  01/23/12    Revision of Right AVF    Family History  Problem Relation Age of Onset  . Diabetes Mother   . Diabetes Brother   . Diabetes Brother   . Diabetes Maternal Grandmother   . Anesthesia problems Neg Hx     Social History:  reports  that she has never smoked. She has never used smokeless tobacco. She reports that she does not drink alcohol or use illicit drugs.  Allergies: No Known Allergies  Medications: I have reviewed the patient's current medications.  Results for orders placed during the hospital encounter of 01/05/14 (from the past 48 hour(s))  CBC WITH DIFFERENTIAL     Status: Abnormal   Collection Time    01/05/14  4:55 AM      Result Value Ref Range   WBC 4.9  4.0 - 10.5 K/uL   RBC 3.44 (*) 3.87 - 5.11 MIL/uL   Hemoglobin 11.0 (*) 12.0 - 15.0 g/dL   HCT 32.9 (*) 36.0 - 46.0 %   MCV 95.6  78.0 - 100.0 fL   MCH 32.0  26.0 - 34.0 pg   MCHC 33.4  30.0 - 36.0 g/dL   RDW 14.2  11.5 - 15.5 %   Platelets 208  150 - 400 K/uL   Neutrophils Relative % 59  43 - 77 %   Neutro Abs 2.9  1.7 - 7.7 K/uL   Lymphocytes Relative 24  12 - 46 %   Lymphs Abs 1.2  0.7 - 4.0 K/uL   Monocytes Relative 13 (*) 3 - 12 %   Monocytes Absolute 0.6  0.1 - 1.0 K/uL   Eosinophils  Relative 3  0 - 5 %   Eosinophils Absolute 0.1  0.0 - 0.7 K/uL   Basophils Relative 1  0 - 1 %   Basophils Absolute 0.0  0.0 - 0.1 K/uL  COMPREHENSIVE METABOLIC PANEL     Status: Abnormal   Collection Time    01/05/14  4:55 AM      Result Value Ref Range   Sodium 138  137 - 147 mEq/L   Potassium 5.1  3.7 - 5.3 mEq/L   Chloride 91 (*) 96 - 112 mEq/L   CO2 24  19 - 32 mEq/L   Glucose, Bld 83  70 - 99 mg/dL   BUN 51 (*) 6 - 23 mg/dL   Creatinine, Ser 9.71 (*) 0.50 - 1.10 mg/dL   Calcium 10.9 (*) 8.4 - 10.5 mg/dL   Total Protein 7.7  6.0 - 8.3 g/dL   Albumin 4.1  3.5 - 5.2 g/dL   AST 14  0 - 37 U/L   ALT 8  0 - 35 U/L   Alkaline Phosphatase 86  39 - 117 U/L   Total Bilirubin 0.4  0.3 - 1.2 mg/dL   GFR calc non Af Amer 5 (*) >90 mL/min   GFR calc Af Amer 5 (*) >90 mL/min   Comment: (NOTE)     The eGFR has been calculated using the CKD EPI equation.     This calculation has not been validated in all clinical situations.     eGFR's persistently <90  mL/min signify possible Chronic Kidney     Disease.  TROPONIN I     Status: None   Collection Time    01/05/14  4:55 AM      Result Value Ref Range   Troponin I <0.30  <0.30 ng/mL   Comment:            Due to the release kinetics of cTnI,     a negative result within the first hours     of the onset of symptoms does not rule out     myocardial infarction with certainty.     If myocardial infarction is still suspected,     repeat the test at appropriate intervals.  LIPID PANEL     Status: Abnormal   Collection Time    01/05/14  4:55 AM      Result Value Ref Range   Cholesterol 204 (*) 0 - 200 mg/dL   Triglycerides 144  <150 mg/dL   HDL 83  >39 mg/dL   Total CHOL/HDL Ratio 2.5     VLDL 29  0 - 40 mg/dL   LDL Cholesterol 92  0 - 99 mg/dL   Comment:            Total Cholesterol/HDL:CHD Risk     Coronary Heart Disease Risk Table                         Men   Women      1/2 Average Risk   3.4   3.3      Average Risk       5.0   4.4      2 X Average Risk   9.6   7.1      3 X Average Risk  23.4   11.0                Use the calculated Patient Ratio     above and the CHD  Risk Table     to determine the patient's CHD Risk.                ATP III CLASSIFICATION (LDL):      <100     mg/dL   Optimal      100-129  mg/dL   Near or Above                        Optimal      130-159  mg/dL   Borderline      160-189  mg/dL   High      >190     mg/dL   Very High  HCG, QUANTITATIVE, PREGNANCY     Status: None   Collection Time    01/05/14  8:01 AM      Result Value Ref Range   hCG, Beta Chain, Quant, S <1  <5 mIU/mL   Comment:              GEST. AGE      CONC.  (mIU/mL)       <=1 WEEK        5 - 50         2 WEEKS       50 - 500         3 WEEKS       100 - 10,000         4 WEEKS     1,000 - 30,000         5 WEEKS     3,500 - 115,000       6-8 WEEKS     12,000 - 270,000        12 WEEKS     15,000 - 220,000                FEMALE AND NON-PREGNANT FEMALE:         LESS THAN 5 mIU/mL     Ct Head Wo Contrast  01/05/2014   CLINICAL DATA:  Headache, history of cervical cancer. History of renal failure, on dialysis.  EXAM: CT HEAD WITHOUT CONTRAST  TECHNIQUE: Contiguous axial images were obtained from the base of the skull through the vertex without intravenous contrast.  COMPARISON:  CT of the head Oct 08, 2013  FINDINGS: The ventricles and sulci are normal. No intraparenchymal hemorrhage, mass effect nor midline shift. No acute large vascular territory infarcts.  No abnormal extra-axial fluid collections. Basal cisterns are patent.  No skull fracture. The included ocular globes and orbital contents are non-suspicious. The mastoid aircells and included paranasal sinuses are well-aerated.  IMPRESSION: No acute intracranial process.  Normal noncontrast CT of the head.   Electronically Signed   By: Elon Alas   On: 01/05/2014 06:34   US Transvaginal Non-ob  01/05/2014   CLINICAL DATA:  Lower abdominal pain with vaginal discharge  EXAM: TRANSABDOMINAL AND TRANSVAGINAL ULTRASOUND OF PELVIS  TECHNIQUE: Both transabdominal and transvaginal ultrasound examinations of the pelvis were performed. Transabdominal technique was performed for global imaging of the pelvis including uterus, ovaries, adnexal regions, and pelvic cul-de-sac. It was necessary to proceed with endovaginal exam following the transabdominal exam to visualize the uterus and adnexae in better detail.  COMPARISON:  None  FINDINGS: Uterus  Measurements: 7.4 x 3.2 x 5.5 cm. No focal abnormality or fibroid. Minimally complex septated cervical nabothian cysts suspected along the endocervical canal.  Endometrium  Thickness: 3 mm.  No focal abnormality  visualized.  Right ovary  Measurements: 2.6 x 2.1 x 2.3 cm. Normal appearance/no adnexal mass.  Left ovary  Measurements: 2.4 x 1.7 1.9 cm. Normal appearance/no adnexal mass.  Other findings  No free fluid.  IMPRESSION: No acute finding by pelvic ultrasound.   Electronically Signed   By:  Daryll Brod M.D.   On: 01/05/2014 08:57   US Pelvis Complete  01/05/2014   CLINICAL DATA:  Lower abdominal pain with vaginal discharge  EXAM: TRANSABDOMINAL AND TRANSVAGINAL ULTRASOUND OF PELVIS  TECHNIQUE: Both transabdominal and transvaginal ultrasound examinations of the pelvis were performed. Transabdominal technique was performed for global imaging of the pelvis including uterus, ovaries, adnexal regions, and pelvic cul-de-sac. It was necessary to proceed with endovaginal exam following the transabdominal exam to visualize the uterus and adnexae in better detail.  COMPARISON:  None  FINDINGS: Uterus  Measurements: 7.4 x 3.2 x 5.5 cm. No focal abnormality or fibroid. Minimally complex septated cervical nabothian cysts suspected along the endocervical canal.  Endometrium  Thickness: 3 mm.  No focal abnormality visualized.  Right ovary  Measurements: 2.6 x 2.1 x 2.3 cm. Normal appearance/no adnexal mass.  Left ovary  Measurements: 2.4 x 1.7 1.9 cm. Normal appearance/no adnexal mass.  Other findings  No free fluid.  IMPRESSION: No acute finding by pelvic ultrasound.   Electronically Signed   By: Daryll Brod M.D.   On: 01/05/2014 08:57   Dg Chest Portable 1 View  01/05/2014   CLINICAL DATA:  Chest pain and headache  EXAM: PORTABLE CHEST - 1 VIEW  COMPARISON:  03/19/2013  FINDINGS: The heart size has normalized since previous, likely decreased or resolved pericardial effusion. Negative upper mediastinal contours. There is no edema, consolidation, effusion, or pneumothorax.  IMPRESSION: No active disease.   Electronically Signed   By: Jorje Guild M.D.   On: 01/05/2014 05:35    Review of Systems  Constitutional: Negative for fever and chills.  Respiratory: Negative for shortness of breath.   Cardiovascular: Positive for chest pain. Negative for orthopnea and leg swelling.  Gastrointestinal: Positive for nausea, vomiting and abdominal pain. Negative for diarrhea.  Neurological: Positive for  weakness and headaches.   Blood pressure 113/62, pulse 73, temperature 98.1 F (36.7 C), temperature source Oral, resp. rate 20, height '5\' 6"'  (1.676 m), weight 66 kg (145 lb 8.1 oz), last menstrual period 01/05/2014, SpO2 100.00%. Physical Exam  Constitutional: She appears distressed.  Eyes: No scleral icterus.  Neck: No JVD present.  Cardiovascular: Normal rate and regular rhythm.   No murmur heard. Respiratory: No respiratory distress. She has no wheezes.  GI: Soft. Bowel sounds are normal. There is tenderness. There is no rebound and no guarding.  Musculoskeletal: She exhibits no edema.    Assessment/Plan: Problem #1 chest pain: Etiology not clear. Presently her cardiac enzymes are okay and pain is slightly better. Problem #2 end-stage renal disease: She status post hemodialysis on Tuesday. Patient complains of nausea and vomiting but no diarrhea. Etiology is not clear. She has some epigastric tenderness but no rebound tenderness. Problem #3 anemia: Her hemoglobin is range Problem #4 metabolic bone disease: Her calcium is high and the side of her target range. Presently phosphorus is not available. Problem #5 history of lupus Problem #6 history of hypertension her blood pressure is reasonably controlled Problem #7 history of seizure disorder Plan: We'll make arrangements for patient to get dialysis today Will dialyze her for 4 hours and possibly try to get about 2 and half liters We'll  continue Maria Vazquez other treatment as before. We'll DC TUMS and PhosLo. We'll check her phosphorus in the morning.  Zaylin Runco S 01/05/2014, 9:35 AM

## 2014-01-06 DIAGNOSIS — K859 Acute pancreatitis without necrosis or infection, unspecified: Secondary | ICD-10-CM | POA: Diagnosis present

## 2014-01-06 DIAGNOSIS — I369 Nonrheumatic tricuspid valve disorder, unspecified: Secondary | ICD-10-CM

## 2014-01-06 DIAGNOSIS — R072 Precordial pain: Secondary | ICD-10-CM

## 2014-01-06 DIAGNOSIS — G40909 Epilepsy, unspecified, not intractable, without status epilepticus: Secondary | ICD-10-CM

## 2014-01-06 DIAGNOSIS — R51 Headache: Secondary | ICD-10-CM

## 2014-01-06 LAB — BASIC METABOLIC PANEL
Anion gap: 13 (ref 5–15)
BUN: 13 mg/dL (ref 6–23)
CHLORIDE: 95 meq/L — AB (ref 96–112)
CO2: 31 meq/L (ref 19–32)
Calcium: 9.5 mg/dL (ref 8.4–10.5)
Creatinine, Ser: 4.64 mg/dL — ABNORMAL HIGH (ref 0.50–1.10)
GFR calc Af Amer: 13 mL/min — ABNORMAL LOW (ref >90)
GFR calc non Af Amer: 11 mL/min — ABNORMAL LOW (ref >90)
Glucose, Bld: 82 mg/dL (ref 70–99)
POTASSIUM: 5.2 meq/L (ref 3.7–5.3)
Sodium: 139 mEq/L (ref 137–147)

## 2014-01-06 LAB — CBC
HEMATOCRIT: 33.6 % — AB (ref 36.0–46.0)
HEMOGLOBIN: 11.1 g/dL — AB (ref 12.0–15.0)
MCH: 32.3 pg (ref 26.0–34.0)
MCHC: 33 g/dL (ref 30.0–36.0)
MCV: 97.7 fL (ref 78.0–100.0)
Platelets: 167 10*3/uL (ref 150–400)
RBC: 3.44 MIL/uL — ABNORMAL LOW (ref 3.87–5.11)
RDW: 14.2 % (ref 11.5–15.5)
WBC: 3.4 10*3/uL — ABNORMAL LOW (ref 4.0–10.5)

## 2014-01-06 LAB — PHOSPHORUS: Phosphorus: 3.8 mg/dL (ref 2.3–4.6)

## 2014-01-06 MED ORDER — CLOTRIMAZOLE 1 % VA CREA
1.0000 | TOPICAL_CREAM | Freq: Every day | VAGINAL | Status: DC
  Administered 2014-01-06: 1 via VAGINAL
  Filled 2014-01-06: qty 45

## 2014-01-06 MED ORDER — CLOTRIMAZOLE 2 % VA CREA: 1.0000 | TOPICAL_CREAM | Freq: Every day | VAGINAL | Status: DC

## 2014-01-06 MED ORDER — HYDROCODONE-ACETAMINOPHEN 5-325 MG PO TABS
1.0000 | ORAL_TABLET | ORAL | Status: DC | PRN
  Administered 2014-01-07 (×2): 1 via ORAL
  Filled 2014-01-06 (×2): qty 1

## 2014-01-06 NOTE — Progress Notes (Addendum)
[facility] PROGRESS NOTE  Maria Vazquez [id number] DOB: 1975-11-16 DOA: 01/05/2014 PCP: Estanislado Emms, MD    Code Status: FULL CODE Family Communication: Discussed with patient; no family available Disposition Plan: Anticipate discharge to home in the next 24 hours.   Consultants:  Nephrology  Procedures:  Hemodialysis 2-D echocardiogram 01/06/14:Study Conclusions  - Left ventricle: The cavity size was normal. Wall thickness was normal. Systolic function was normal. The estimated ejection fraction was in the range of 55% to 60%. Wall motion was normal; there were no regional wall motion abnormalities. Left ventricular diastolic function parameters were normal. - Mitral valve: Calcification, with involvement of the head of the anterolateral and head of the posteromedial papillary muscle. Leaflet separation was normal. There was trivial regurgitation. - Atrial septum: No defect or patent foramen ovale was identified. - Tricuspid valve: There was mild regurgitation. - Pulmonary arteries: PA peak pressure: 32 mm Hg (S). - Pericardium, extracardiac: There was no pericardial effusion.  Impressions: - Normal LV wall thickness and chamber size with LVEF 0000000, normal diastolic function. Calcification of papillary muscle tips without obvious valve dysfunction, trivial mitral regurgitation. Mild tricuspid regurgitation with PASP 32 mmHg.    Antibiotics:  None  HPI/Subjective: The patient complains of a headache, frontal, without visual changes or nausea. Her chest pain and abdominal pain has completely resolved. She denies vaginal discharge, but is still on her menstrual period. She would like to have her diet advanced.  Objective: Filed Vitals:   01/06/14 1436  BP: 96/61  Pulse: 62  Temp: 98 F (36.7 C)  Resp: 17   oxygen saturation 100% on room air.  Intake/Output Summary (Last 24 hours) at 01/06/14 1623 Last data filed at 01/06/14 1330   Gross per 24 hour  Intake    360 ml  Output   1800 ml  Net  -1440 ml   Filed Weights   01/05/14 0903 01/05/14 1745 01/05/14 2215  Weight: 66 kg (145 lb 8.1 oz) 68.584 kg (151 lb 3.2 oz) 66.452 kg (146 lb 8 oz)    Exam:   General: Pleasant alert 38 year old [place] woman laying in bed, in no acute distress.   Cardiovascular: S1, S2, with a soft systolic murmur.  Respiratory: Clear to auscultation bilaterally  Abdomen: Positive bowel sounds, soft, nontender, nondistended.  GU deferred.  Musculoskeletal: Palpable thrill right upper extremity AV fistula; no acute hot red joints; no pedal edema.   Data Reviewed: Basic Metabolic Panel:  Recent Labs Lab 01/05/14 0455 01/06/14 0622  NA 138 139  K 5.1 5.2  CL 91* 95*  CO2 24 31  GLUCOSE 83 82  BUN 51* 13  CREATININE 9.71* 4.64*  CALCIUM 10.9* 9.5  PHOS  --  3.8   Liver Function Tests:  Recent Labs Lab 01/05/14 0455  AST 14  ALT 8  ALKPHOS 86  BILITOT 0.4  PROT 7.7  ALBUMIN 4.1    Recent Labs Lab 01/05/14 0903  LIPASE 125*   No results found for this basename: AMMONIA,  in the last 168 hours CBC:  Recent Labs Lab 01/05/14 0455 01/06/14 0622  WBC 4.9 3.4*  NEUTROABS 2.9  --   HGB 11.0* 11.1*  HCT 32.9* 33.6*  MCV 95.6 97.7  PLT 208 167   Cardiac Enzymes:  Recent Labs Lab 01/05/14 0455 01/05/14 0903 01/05/14 1404 01/05/14 2256  TROPONINI <0.30 <0.30 <0.30 <0.30   BNP (last 3 results) No results found for this basename: PROBNP,  in the last 8760 hours CBG:  No results found for this basename: GLUCAP,  in the last 168 hours  No results found for this or any previous visit (from the past 240 hour(s)).   Studies: Ct Head Wo Contrast  01/05/2014   CLINICAL DATA:  Headache, history of cervical cancer. History of renal failure, on dialysis.  EXAM: CT HEAD WITHOUT CONTRAST  TECHNIQUE: Contiguous axial images were obtained from the base of the skull through the vertex without intravenous contrast.   COMPARISON:  CT of the head Oct 08, 2013  FINDINGS: The ventricles and sulci are normal. No intraparenchymal hemorrhage, mass effect nor midline shift. No acute large vascular territory infarcts.  No abnormal extra-axial fluid collections. Basal cisterns are patent.  No skull fracture. The included ocular globes and orbital contents are non-suspicious. The mastoid aircells and included paranasal sinuses are well-aerated.  IMPRESSION: No acute intracranial process.  Normal noncontrast CT of the head.   Electronically Signed   By: Elon Alas   On: 01/05/2014 06:34   US Transvaginal Non-ob  01/05/2014   CLINICAL DATA:  Lower abdominal pain with vaginal discharge  EXAM: TRANSABDOMINAL AND TRANSVAGINAL ULTRASOUND OF PELVIS  TECHNIQUE: Both transabdominal and transvaginal ultrasound examinations of the pelvis were performed. Transabdominal technique was performed for global imaging of the pelvis including uterus, ovaries, adnexal regions, and pelvic cul-de-sac. It was necessary to proceed with endovaginal exam following the transabdominal exam to visualize the uterus and adnexae in better detail.  COMPARISON:  None  FINDINGS: Uterus  Measurements: 7.4 x 3.2 x 5.5 cm. No focal abnormality or fibroid. Minimally complex septated cervical nabothian cysts suspected along the endocervical canal.  Endometrium  Thickness: 3 mm.  No focal abnormality visualized.  Right ovary  Measurements: 2.6 x 2.1 x 2.3 cm. Normal appearance/no adnexal mass.  Left ovary  Measurements: 2.4 x 1.7 1.9 cm. Normal appearance/no adnexal mass.  Other findings  No free fluid.  IMPRESSION: No acute finding by pelvic ultrasound.   Electronically Signed   By: Daryll Brod M.D.   On: 01/05/2014 08:57   US Pelvis Complete  01/05/2014   CLINICAL DATA:  Lower abdominal pain with vaginal discharge  EXAM: TRANSABDOMINAL AND TRANSVAGINAL ULTRASOUND OF PELVIS  TECHNIQUE: Both transabdominal and transvaginal ultrasound examinations of the pelvis were  performed. Transabdominal technique was performed for global imaging of the pelvis including uterus, ovaries, adnexal regions, and pelvic cul-de-sac. It was necessary to proceed with endovaginal exam following the transabdominal exam to visualize the uterus and adnexae in better detail.  COMPARISON:  None  FINDINGS: Uterus  Measurements: 7.4 x 3.2 x 5.5 cm. No focal abnormality or fibroid. Minimally complex septated cervical nabothian cysts suspected along the endocervical canal.  Endometrium  Thickness: 3 mm.  No focal abnormality visualized.  Right ovary  Measurements: 2.6 x 2.1 x 2.3 cm. Normal appearance/no adnexal mass.  Left ovary  Measurements: 2.4 x 1.7 1.9 cm. Normal appearance/no adnexal mass.  Other findings  No free fluid.  IMPRESSION: No acute finding by pelvic ultrasound.   Electronically Signed   By: Daryll Brod M.D.   On: 01/05/2014 08:57   Dg Chest Portable 1 View  01/05/2014   CLINICAL DATA:  Chest pain and headache  EXAM: PORTABLE CHEST - 1 VIEW  COMPARISON:  03/19/2013  FINDINGS: The heart size has normalized since previous, likely decreased or resolved pericardial effusion. Negative upper mediastinal contours. There is no edema, consolidation, effusion, or pneumothorax.  IMPRESSION: No active disease.   Electronically Signed   By:  Jorje Guild M.D.   On: 01/05/2014 05:35    Scheduled Meds: . azaTHIOprine  50 mg Oral Daily  . cholecalciferol  1,000 Units Oral Daily  . heparin  5,000 Units Subcutaneous 3 times per day  . hydroxychloroquine  200 mg Oral Daily  . levETIRAcetam  250 mg Oral Q T,Th,Sat-1800  . levETIRAcetam  500 mg Oral BID  . pantoprazole (PROTONIX) IV  40 mg Intravenous Q24H   possibly secondary to Continuous Infusions:   Assessment and plan: Principal Problem:   Left sided chest pain Active Problems:   Nausea & vomiting   Abdominal pain, lower   Vaginal discharge   Anemia in ESRD (end-stage renal disease)   Lupus (systemic lupus erythematosus)   End  stage renal disease   Seizure disorder   1. Left-sided chest pain.  Etiology unknown, possibly secondary to gastroesophageal reflux or possibly from acute pancreatitis. Her troponin I. was negative x3. Her d-dimer was within normal limits, therefore, pulmonary embolism is less likely.. Her 2-D echocardiogram revealed an ejection fraction of 55-60% and no left ventricular regional wall motion abnormalities. We will continue to treat her symptomatically. 2. Abdominal pain, nausea, and vomiting, possibly secondary to acute pancreatitis.  The patient's lipase was 125. Followup lipase is pending. Her liver transaminases and total bilirubin were within normal limits. She has a history of gallstone pancreatitis in 2011, status post cholecystectomy. The elevated lipase may be secondary to decreased clearance from end-stage renal disease. Her abdominal pain was not epigastric and could have been secondary to menstrual cramping. Her symptoms have abated. We will advance her diet and continue PPI therapy empirically with Protonix. 3. Menstrual period/vaginal discharge; query candidal vaginitis. The pelvic examination was deferred given that she is on her menstrual period. She denies vaginal discharge, but does have some vaginal itching. Clinically, it may be secondary to Candida vaginitis. We'll start vaginal Lotrimin empirically. Pelvic ultrasound was ordered and revealed no acute findings or suspicious findings. Her serum hCG was negative. She has a history of cervical cancer in situ and is followed by Dr. Glo Herring. Per her account, her recent Pap smear and evaluation in February of this year was within normal limits. She is symptomatically improved, so we'll not pursue any further diagnostic workup. She was instructed to followup with Dr. Glo Herring if her vaginal discharge returned. Of note, she does not make urine anymore. 4. 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 Nephrology was consulted and  dialyze the patient. Currently stable. 5. Systemic lupus erythematous. Currently stable on Plaquenil. 6. Headache. She has no neurological findings. We'll treat supportively with as needed acetaminophen or hydrocodone. 7. Seizure disorder. Currently stable. We'll continue Keppra. 8. Mild hyperlipidemia. We'll treat with diet and hold off on statin therapy for now.  Time spent: 35 minutes    [facility] Pager [phone]. If 7PM-7AM, please contact night-coverage at [email or web address], password [facility] 01/06/2014, 4:23 PM  LOS: 1 day

## 2014-01-06 NOTE — Care Management Note (Signed)
    Page 1 of 1   01/06/2014     11:28:31 AM CARE MANAGEMENT NOTE 01/06/2014  Patient:  Maria Vazquez, Maria Vazquez   Account Number:  [id number]  Date Initiated:  01/06/2014  Documentation initiated by:  Jolene Provost  Subjective/Objective Assessment:   Patient admitted for CP. Patient is from home with self care. Pt is independent at home with no HH services, DME's or medication needs prior to admission. Pt is on dialysis Tuesday/Thursdays/Saturdays at [facility] in [place].     Action/Plan:   Patient plans to discharge home with self care over the weekend. Pt has no CM needs identified at this time.   Anticipated DC Date:  01/07/2014   Anticipated DC Plan:  [place]  CM consult      Choice offered to / List presented to:             Status of service:  Completed, signed off Medicare Important Message given?   (If response is "NO", the following Medicare IM given date fields will be blank) Date Medicare IM given:   Medicare IM given by:   Date Additional Medicare IM given:   Additional Medicare IM given by:    Discharge Disposition:  HOME/SELF CARE  Per UR Regulation:    If discussed at Long Length of Stay Meetings, dates discussed:    Comments:  01/06/2014 [place] Grant, RN, MSN, [facility]

## 2014-01-06 NOTE — Progress Notes (Signed)
Subjective: Interval History: has complaints is still headache.. Patient presently denies any nausea or vomiting. Her chest pain has improved..  Objective: Vital signs in last 24 hours: Temp:  [97.5 F (36.4 C)-98.1 F (36.7 C)] 98.1 F (36.7 C) (08/21 0453) Pulse Rate:  [58-87] 70 (08/21 0453) Resp:  [16-20] 20 (08/20 2240) BP: (82-113)/(44-70) 97/55 mmHg (08/21 0453) SpO2:  [100 %] 100 % (08/21 0453) Weight:  [66 kg (145 lb 8.1 oz)-68.584 kg (151 lb 3.2 oz)] 66.452 kg (146 lb 8 oz) (08/20 2215) Weight change: 0 kg (0 lb)  Intake/Output from previous day: 08/20 0701 - 08/21 0700 In: 240 [P.O.:240] Out: 1800  Intake/Output this shift:    General appearance: alert, cooperative and no distress Resp: clear to auscultation bilaterally Cardio: regular rate and rhythm, S1, S2 normal, no murmur, click, rub or gallop GI: soft, non-tender; bowel sounds normal; no masses,  no organomegaly Extremities: extremities normal, atraumatic, no cyanosis or edema  Lab Results:  Recent Labs  01/05/14 0455 01/06/14 0622  WBC 4.9 3.4*  HGB 11.0* 11.1*  HCT 32.9* 33.6*  PLT 208 167   BMET:  Recent Labs  01/05/14 0455 01/06/14 0622  NA 138 139  K 5.1 5.2  CL 91* 95*  CO2 24 31  GLUCOSE 83 82  BUN 51* 13  CREATININE 9.71* 4.64*  CALCIUM 10.9* 9.5   No results found for this basename: PTH,  in the last 72 hours Iron Studies: No results found for this basename: IRON, TIBC, TRANSFERRIN, FERRITIN,  in the last 72 hours  Studies/Results: Ct Head Wo Contrast  01/05/2014   CLINICAL DATA:  Headache, history of cervical cancer. History of renal failure, on dialysis.  EXAM: CT HEAD WITHOUT CONTRAST  TECHNIQUE: Contiguous axial images were obtained from the base of the skull through the vertex without intravenous contrast.  COMPARISON:  CT of the head Oct 08, 2013  FINDINGS: The ventricles and sulci are normal. No intraparenchymal hemorrhage, mass effect nor midline shift. No acute large  vascular territory infarcts.  No abnormal extra-axial fluid collections. Basal cisterns are patent.  No skull fracture. The included ocular globes and orbital contents are non-suspicious. The mastoid aircells and included paranasal sinuses are well-aerated.  IMPRESSION: No acute intracranial process.  Normal noncontrast CT of the head.   Electronically Signed   By: Elon Alas   On: 01/05/2014 06:34   US Transvaginal Non-ob  01/05/2014   CLINICAL DATA:  Lower abdominal pain with vaginal discharge  EXAM: TRANSABDOMINAL AND TRANSVAGINAL ULTRASOUND OF PELVIS  TECHNIQUE: Both transabdominal and transvaginal ultrasound examinations of the pelvis were performed. Transabdominal technique was performed for global imaging of the pelvis including uterus, ovaries, adnexal regions, and pelvic cul-de-sac. It was necessary to proceed with endovaginal exam following the transabdominal exam to visualize the uterus and adnexae in better detail.  COMPARISON:  None  FINDINGS: Uterus  Measurements: 7.4 x 3.2 x 5.5 cm. No focal abnormality or fibroid. Minimally complex septated cervical nabothian cysts suspected along the endocervical canal.  Endometrium  Thickness: 3 mm.  No focal abnormality visualized.  Right ovary  Measurements: 2.6 x 2.1 x 2.3 cm. Normal appearance/no adnexal mass.  Left ovary  Measurements: 2.4 x 1.7 1.9 cm. Normal appearance/no adnexal mass.  Other findings  No free fluid.  IMPRESSION: No acute finding by pelvic ultrasound.   Electronically Signed   By: Daryll Brod M.D.   On: 01/05/2014 08:57   US Pelvis Complete  01/05/2014   CLINICAL DATA:  Lower abdominal pain with vaginal discharge  EXAM: TRANSABDOMINAL AND TRANSVAGINAL ULTRASOUND OF PELVIS  TECHNIQUE: Both transabdominal and transvaginal ultrasound examinations of the pelvis were performed. Transabdominal technique was performed for global imaging of the pelvis including uterus, ovaries, adnexal regions, and pelvic cul-de-sac. It was necessary  to proceed with endovaginal exam following the transabdominal exam to visualize the uterus and adnexae in better detail.  COMPARISON:  None  FINDINGS: Uterus  Measurements: 7.4 x 3.2 x 5.5 cm. No focal abnormality or fibroid. Minimally complex septated cervical nabothian cysts suspected along the endocervical canal.  Endometrium  Thickness: 3 mm.  No focal abnormality visualized.  Right ovary  Measurements: 2.6 x 2.1 x 2.3 cm. Normal appearance/no adnexal mass.  Left ovary  Measurements: 2.4 x 1.7 1.9 cm. Normal appearance/no adnexal mass.  Other findings  No free fluid.  IMPRESSION: No acute finding by pelvic ultrasound.   Electronically Signed   By: Daryll Brod M.D.   On: 01/05/2014 08:57   Dg Chest Portable 1 View  01/05/2014   CLINICAL DATA:  Chest pain and headache  EXAM: PORTABLE CHEST - 1 VIEW  COMPARISON:  03/19/2013  FINDINGS: The heart size has normalized since previous, likely decreased or resolved pericardial effusion. Negative upper mediastinal contours. There is no edema, consolidation, effusion, or pneumothorax.  IMPRESSION: No active disease.   Electronically Signed   By: Jorje Guild M.D.   On: 01/05/2014 05:35    I have reviewed the patient'Vazquez current medications.  Assessment/Plan: Problem #1 end-stage renal disease: She status post hemodialysis yesterday presently patient denies any uremic signs and symptoms. Problem #2 history of chest pain: Patient feels much better today. The only complaint she had is mild headache which she has for some time. Problem #3 anemia: Her hemoglobin hematocrit is stable. Patient is on Epogen. Problem #4 history of lupus Problem #5 metabolic bone disease: Her calcium and phosphorus is range. Problem #6 history of GERD Problem #7 fluid management: Patient at this moment does not have any sign of congestive heart failure. Plan: We'll continue his present management We'll make arrangements for patient to get dialysis tomorrow if patient is not going  to be discharge. If patient is going to discharge she will go to her outpatient dialysis tomorrow.     LOS: 1 day   Maria Vazquez 01/06/2014,8:00 AM

## 2014-01-06 NOTE — Progress Notes (Signed)
  Echocardiogram 2D Echocardiogram has been performed.  [place], [place] 01/06/2014, 9:37 AM

## 2014-01-07 DIAGNOSIS — B3731 Acute candidiasis of vulva and vagina: Secondary | ICD-10-CM

## 2014-01-07 DIAGNOSIS — B373 Candidiasis of vulva and vagina: Secondary | ICD-10-CM

## 2014-01-07 DIAGNOSIS — K859 Acute pancreatitis without necrosis or infection, unspecified: Secondary | ICD-10-CM

## 2014-01-07 LAB — CBC
HEMATOCRIT: 31.5 % — AB (ref 36.0–46.0)
HEMOGLOBIN: 10.3 g/dL — AB (ref 12.0–15.0)
MCH: 32 pg (ref 26.0–34.0)
MCHC: 32.7 g/dL (ref 30.0–36.0)
MCV: 97.8 fL (ref 78.0–100.0)
Platelets: 170 10*3/uL (ref 150–400)
RBC: 3.22 MIL/uL — AB (ref 3.87–5.11)
RDW: 14.1 % (ref 11.5–15.5)
WBC: 3.2 10*3/uL — ABNORMAL LOW (ref 4.0–10.5)

## 2014-01-07 LAB — BASIC METABOLIC PANEL
ANION GAP: 13 (ref 5–15)
Anion gap: 14 (ref 5–15)
BUN: 29 mg/dL — AB (ref 6–23)
BUN: 7 mg/dL (ref 6–23)
CHLORIDE: 93 meq/L — AB (ref 96–112)
CO2: 29 meq/L (ref 19–32)
CO2: 32 mEq/L (ref 19–32)
Calcium: 9.8 mg/dL (ref 8.4–10.5)
Calcium: 9.8 mg/dL (ref 8.4–10.5)
Chloride: 95 mEq/L — ABNORMAL LOW (ref 96–112)
Creatinine, Ser: 7.64 mg/dL — ABNORMAL HIGH (ref 0.50–1.10)
Creatinine, Ser: 3.55 mg/dL — ABNORMAL HIGH (ref 0.50–1.10)
GFR calc Af Amer: 7 mL/min — ABNORMAL LOW (ref >90)
GFR calc Af Amer: 18 mL/min — ABNORMAL LOW (ref >90)
GFR calc non Af Amer: 6 mL/min — ABNORMAL LOW (ref >90)
GFR calc non Af Amer: 15 mL/min — ABNORMAL LOW (ref >90)
GLUCOSE: 82 mg/dL (ref 70–99)
GLUCOSE: 111 mg/dL — AB (ref 70–99)
POTASSIUM: 5.9 meq/L — AB (ref 3.7–5.3)
POTASSIUM: 4.5 meq/L (ref 3.7–5.3)
Sodium: 138 mEq/L (ref 137–147)
Sodium: 138 mEq/L (ref 137–147)

## 2014-01-07 LAB — LIPASE, BLOOD: Lipase: 68 U/L — ABNORMAL HIGH (ref 11–59)

## 2014-01-07 MED ORDER — PENTAFLUOROPROP-TETRAFLUOROETH EX AERO
1.0000 "application " | INHALATION_SPRAY | CUTANEOUS | Status: DC | PRN
  Filled 2014-01-07: qty 30

## 2014-01-07 MED ORDER — SODIUM CHLORIDE 0.9 % IV SOLN: 100.0000 mL | INTRAVENOUS | Status: DC | PRN

## 2014-01-07 MED ORDER — LIDOCAINE HCL (PF) 1 % IJ SOLN: 5.0000 mL | INTRAMUSCULAR | Status: DC | PRN

## 2014-01-07 MED ORDER — CLOTRIMAZOLE 1 % VA CREA: 1.0000 | TOPICAL_CREAM | Freq: Every day | VAGINAL | Status: DC

## 2014-01-07 MED ORDER — PANTOPRAZOLE SODIUM 20 MG PO TBEC: 20.0000 mg | DELAYED_RELEASE_TABLET | Freq: Every day | ORAL | Status: DC

## 2014-01-07 MED ORDER — ALTEPLASE 2 MG IJ SOLR
2.0000 mg | Freq: Once | INTRAMUSCULAR | Status: DC | PRN
Start: 2014-01-07 — End: 2014-01-07

## 2014-01-07 MED ORDER — HEPARIN SODIUM (PORCINE) 1000 UNIT/ML DIALYSIS: 1000.0000 [IU] | INTRAMUSCULAR | Status: DC | PRN

## 2014-01-07 MED ORDER — NEPRO/CARBSTEADY PO LIQD
237.0000 mL | ORAL | Status: DC | PRN
  Filled 2014-01-07: qty 237

## 2014-01-07 NOTE — Progress Notes (Signed)
Patient and family received discharge instructions and was able to teach back about upcoming dialysis appointment, medication, and diet.  Patient had no further questions/concerns.  Patient's IV was removed and was clean, dry, and intact upon removal.  Patient was escorted to vehicle via wheelchair by nurse tech.

## 2014-01-07 NOTE — Procedures (Signed)
   HEMODIALYSIS TREATMENT NOTE:  4 hour heparin-free dialysis completed via right upper arm AVF (15g ante/retrograde).  Goal NOT met:  BP unable to tolerate removal of 2.5 liters as ordered.  Pt had one symptomatic hypotensive episode relieved with NS bolus.  Ultrafiltration was interrupted x 30 minutes.  Net UF removed:  2.3 liters.  All blood was reinfused and hemostasis was achieved within 10 minutes.  Report given to Jovita Kussmaul, Therapist, sports.  Khandi Kernes L. Verlinda Slotnick, RN, CDN

## 2014-01-07 NOTE — Progress Notes (Signed)
Subjective: Interval History: Patient denies any chest papin today and she states that she is feeling much better. Still she has headache but is better. She has headache on and off from before for a long time  Objective: Vital signs in last 24 hours: Temp:  [98 F (36.7 C)-98.5 F (36.9 C)] 98.2 F (36.8 C) (08/22 0644) Pulse Rate:  [62-68] 68 (08/22 0644) Resp:  [13-20] 13 (08/22 0644) BP: (96-108)/(61-67) 108/62 mmHg (08/22 0644) SpO2:  [100 %] 100 % (08/22 0644) Weight:  [68.675 kg (151 lb 6.4 oz)] 68.675 kg (151 lb 6.4 oz) (08/22 0644) Weight change: 2.675 kg (5 lb 14.3 oz)  Intake/Output from previous day: 08/21 0701 - 08/22 0700 In: 600 [P.O.:600] Out: -  Intake/Output this shift:    General appearance: alert, cooperative and no distress Resp: clear to auscultation bilaterally Cardio: regular rate and rhythm, S1, S2 normal, no murmur, click, rub or gallop GI: soft, non-tender; bowel sounds normal; no masses,  no organomegaly Extremities: extremities normal, atraumatic, no cyanosis or edema  Lab Results:  Recent Labs  01/05/14 0455 01/06/14 0622  WBC 4.9 3.4*  HGB 11.0* 11.1*  HCT 32.9* 33.6*  PLT 208 167   BMET:   Recent Labs  01/05/14 0455 01/06/14 0622  NA 138 139  K 5.1 5.2  CL 91* 95*  CO2 24 31  GLUCOSE 83 82  BUN 51* 13  CREATININE 9.71* 4.64*  CALCIUM 10.9* 9.5   No results found for this basename: PTH,  in the last 72 hours Iron Studies: No results found for this basename: IRON, TIBC, TRANSFERRIN, FERRITIN,  in the last 72 hours  Studies/Results: US Transvaginal Non-ob  01/05/2014   CLINICAL DATA:  Lower abdominal pain with vaginal discharge  EXAM: TRANSABDOMINAL AND TRANSVAGINAL ULTRASOUND OF PELVIS  TECHNIQUE: Both transabdominal and transvaginal ultrasound examinations of the pelvis were performed. Transabdominal technique was performed for global imaging of the pelvis including uterus, ovaries, adnexal regions, and pelvic cul-de-sac. It  was necessary to proceed with endovaginal exam following the transabdominal exam to visualize the uterus and adnexae in better detail.  COMPARISON:  None  FINDINGS: Uterus  Measurements: 7.4 x 3.2 x 5.5 cm. No focal abnormality or fibroid. Minimally complex septated cervical nabothian cysts suspected along the endocervical canal.  Endometrium  Thickness: 3 mm.  No focal abnormality visualized.  Right ovary  Measurements: 2.6 x 2.1 x 2.3 cm. Normal appearance/no adnexal mass.  Left ovary  Measurements: 2.4 x 1.7 1.9 cm. Normal appearance/no adnexal mass.  Other findings  No free fluid.  IMPRESSION: No acute finding by pelvic ultrasound.   Electronically Signed   By: Daryll Brod M.D.   On: 01/05/2014 08:57   US Pelvis Complete  01/05/2014   CLINICAL DATA:  Lower abdominal pain with vaginal discharge  EXAM: TRANSABDOMINAL AND TRANSVAGINAL ULTRASOUND OF PELVIS  TECHNIQUE: Both transabdominal and transvaginal ultrasound examinations of the pelvis were performed. Transabdominal technique was performed for global imaging of the pelvis including uterus, ovaries, adnexal regions, and pelvic cul-de-sac. It was necessary to proceed with endovaginal exam following the transabdominal exam to visualize the uterus and adnexae in better detail.  COMPARISON:  None  FINDINGS: Uterus  Measurements: 7.4 x 3.2 x 5.5 cm. No focal abnormality or fibroid. Minimally complex septated cervical nabothian cysts suspected along the endocervical canal.  Endometrium  Thickness: 3 mm.  No focal abnormality visualized.  Right ovary  Measurements: 2.6 x 2.1 x 2.3 cm. Normal appearance/no adnexal mass.  Left  ovary  Measurements: 2.4 x 1.7 1.9 cm. Normal appearance/no adnexal mass.  Other findings  No free fluid.  IMPRESSION: No acute finding by pelvic ultrasound.   Electronically Signed   By: Daryll Brod M.D.   On: 01/05/2014 08:57    I have reviewed the patient's current medications.  Assessment/Plan: Problem #1 end-stage renal disease:  She status post hemodialysis on Thursday presently she is asymptomatic Problem #2 history of chest pain: Patient feels much better today. No complaint any more Problem #3 anemia: Her hemoglobin hematocrit is stable. Patient is on Epogen. Problem #4 history of lupus: She is on Imuran and PLAQENIL Problem #5 metabolic bone disease: Her calcium and phosphorus is range. Problem #6 history of GERD: no abdominal today .Patient with mildly elevated lipase but she is asymptomatic Problem #7 fluid management: Patient at this moment does not have any sign of congestive heart failure. Plan: We'll make arangement for patient to get dialysis today her regular [facility] .  Her next dialysis will be on Tuesday possibely at her regular out patient center.     LOS: 2 days   Ammiel Guiney S 01/07/2014,7:55 AM

## 2014-01-07 NOTE — Discharge Summary (Signed)
Physician Discharge Summary  Maria Vazquez [id number] DOB: 01/16/1976 DOA: 01/05/2014  PCP: Estanislado Emms, MD  Admit date: 01/05/2014 Discharge date: 01/07/2014  Time spent: Greater than 30  minutes  Recommendations for Outpatient Follow-up:  1.   Discharge Diagnoses:  1. Left-sided chest pain myocardial infarction ruled out. Ejection fraction 55-60% per echocardiogram. D-dimer was within normal limits. Etiology possibly secondary to gastroesophageal reflux. 2. Lower abdominal pain associated with nausea and vomiting, possibly secondary to menstrual cramping. 3. Possible acute pancreatitis with a modestly elevated liver lipase. ---Her liver transaminases and total bilirubin were within normal limits. --Patient has a history of gallstone pancreatitis, status post cholecystectomy 2011. 4. Active menstruating woman during the hospital course. 5. Yeast vaginitis. 6. 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 7. Systemic lupus erythematous. 8. Transient hypotension, associated with hemodialysis. 9. 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 10. Seizure disorder. Remained stable. 11. Mild hyperlipidemia. 12. History of cervix carcinoma in situ, followed by Dr. Glo Herring.   Discharge Condition: Improved.  Diet recommendation: Low-fat/renal.  Filed Weights   01/05/14 1745 01/05/14 2215 01/07/14 0644  Weight: 68.584 kg (151 lb 3.2 oz) 66.452 kg (146 lb 8 oz) 68.675 kg (151 lb 6.4 oz)    History of present illness:  The patient is a 38 are old woman with a history of end-stage renal disease from lupus nephritis-on hemodialysis, seizure disorder, and cervix carcinoma in situ, who presented to the emergency department on 01/05/2014 with a chief complaint of left-sided chest pain, nausea/vomiting, and lower abdominal pain. In the ED, she was afebrile and hemodynamically stable. Her EKG revealed normal sinus rhythm with a heart rate of 66 beats per minute and  no suspicious ST or T wave abnormalities. Her chest x-ray revealed no active disease. Her troponin I was negative. Her lab data were significant for a hemoglobin of 11.0, BUN of 51, and creatinine of 9.71. Her liver transaminases and total bilirubin were within normal limits. CT of her head reveals no acute intracranial process. She is being admitted for further evaluation and management.   Hospital Course:  1. Left-sided chest pain.  The patient was started on supportive treatment and analgesics as needed. PPI therapy was started empirically. For further evaluation, a number of studies were ordered. Her troponin I. was negative x3. Her d-dimer was within normal limits, therefore, pulmonary embolism was less likely. Her 2-D echocardiogram revealed an ejection fraction of 55-60% and no left ventricular regional wall motion abnormalities. Her TSH was within normal limits. Etiology of her chest pain may have been secondary to gastroesophageal reflux disease or possibly low-grade acute pancreatitis. With supportive treatment, her chest pain completely resolved. She was discharged on Protonix for treatment of possible gastroesophageal reflux disease.  2. Abdominal pain, nausea, and vomiting likely secondary to menstrual periods/menstrual cramping or possibly acute pancreatitis.  The patient's lipase was 125, but she had no specific epigastric or right upper quadrant pain. Her liver transaminases and total bilirubin were within normal limits. She has a history of gallstone pancreatitis in 2011, status post cholecystectomy. The elevated lipase may be secondary to decreased clearance from end-stage renal disease. Her abdominal pain was not epigastric and could have been secondary to menstrual cramping. She was started on ice chips and sips of clear liquids. IV Protonix was given empirically. Her pain was treated with as needed morphine and her nausea was treated with as needed Zofran. Her symptoms subsided and then  completely resolved. Her diet was advanced which he tolerated well. Her  followup lipase was 68.  3. Menstrual period/vaginal discharge; query candidal vaginitis.  The pelvic examination was deferred given that she is on her menstrual period. The vaginal discharge resolved, but she did have some vaginal itching. Clinically, it was likely secondary to Candida vaginitis. Vaginal Lotrimin was started empirically. Pelvic ultrasound was ordered and revealed no acute findings or suspicious findings. Her serum hCG was negative. She has a history of cervical cancer in situ and is followed by Dr. Glo Herring. Per her account, her recent Pap smear and evaluation in February of this year was within normal limits. At the time of hospital discharge, she had no discharge and no vaginal itching. She no longer makes urine. She was instructed to followup with Dr. Glo Herring as previously scheduled.  4. 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  Nephrology was consulted and dialyze the patient on 2 occasions. She did have some mild asymptomatic hypotension following hemodialysis, but it resolved. 5. Hyperkalemia. Her serum potassium increased to 5.9. Following hemodialysis, it normalized to 4.5. 6. Systemic lupus erythematous.  Remained stable on Plaquenil and Imuran.  7. Headache.  She had no abnormal neurological findings. She was treated with as needed analgesics. Her headache resolved.  8. Seizure disorder.  Remained stable on Keppra.  9. Mild hyperlipidemia.  Her fasting lipid overall revealed a total cholesterol of 204, triglycerides of 144, HDL cholesterol of 83, and LDL cholesterol of 92. She was maintained on a low fat renal diet.     Procedures: 1). Hemodialysis  2.) 2-D echocardiogram 01/06/14:Study Conclusions  - Left ventricle: The cavity size was normal. Wall thickness was normal. Systolic function was normal. The estimated ejection fraction was in the range of 55% to 60%. Wall motion was  normal; there were no regional wall motion abnormalities. Left ventricular diastolic function parameters were normal. - Mitral valve: Calcification, with involvement of the head of the anterolateral and head of the posteromedial papillary muscle. Leaflet separation was normal. There was trivial regurgitation. - Atrial septum: No defect or patent foramen ovale was identified. - Tricuspid valve: There was mild regurgitation. - Pulmonary arteries: PA peak pressure: 32 mm Hg (S). - Pericardium, extracardiac: There was no pericardial effusion.  Impressions: - Normal LV wall thickness and chamber size with LVEF 0000000, normal diastolic function. Calcification of papillary muscle tips without obvious valve dysfunction, trivial mitral regurgitation. Mild tricuspid regurgitation with PASP 32 mmHg.   Consultations:  Nephrology, Dr. Lowanda Foster  Discharge Exam: Filed Vitals:   01/07/14 1447  BP: 90/59  Pulse: 92  Temp: 97.5 F (36.4 C)  Resp: 18    General: Pleasant alert 38 year old Latino woman laying in bed, in no acute distress. Cardiovascular: S1, S2, with a soft systolic murmur. Respiratory: Clear to auscultation bilaterally Abdomen: Positive bowel sounds, soft, nontender, nondistended. GU deferred. Musculoskeletal: Palpable thrill right upper extremity AV fistula; no acute hot red joints; no pedal edema.    Discharge Instructions You were cared for by a hospitalist during your hospital stay. If you have any questions about your discharge medications or the care you received while you were in the hospital after you are discharged, you can call the unit and asked to speak with the hospitalist on call if the hospitalist that took care of you is not available. Once you are discharged, your primary care physician will handle any further medical issues. Please note that NO REFILLS for any discharge medications will be authorized once you are discharged, as it is imperative that you return  to  your primary care physician (or establish a relationship with a primary care physician if you do not have one) for your aftercare needs so that they can reassess your need for medications and monitor your lab values.      Discharge Instructions   Diet - low sodium heart healthy    Complete by:  As directed      Discharge instructions    Complete by:  As directed   Avoid eating foods high in fat or grease.     Increase activity slowly    Complete by:  As directed             Medication List         azaTHIOprine 50 MG tablet  Commonly known as:  IMURAN  Take 50 mg by mouth daily.     calcium acetate 667 MG capsule  Commonly known as:  PHOSLO  Take 1,334 mg by mouth 3 (three) times daily with meals.     calcium carbonate 750 MG chewable tablet  Commonly known as:  TUMS EX  Chew 3 tablets by mouth daily.     cholecalciferol 1000 UNITS tablet  Commonly known as:  VITAMIN D  Take 1,000 Units by mouth daily.     clotrimazole 1 % vaginal cream  Commonly known as:  GYNE-LOTRIMIN  Place 1 Applicatorful vaginally at bedtime.     hydroxychloroquine 200 MG tablet  Commonly known as:  PLAQUENIL  Take 200 mg by mouth daily.     levETIRAcetam 500 MG tablet  Commonly known as:  KEPPRA  Take 500 mg by mouth 2 (two) times daily.     levETIRAcetam 250 MG tablet  Commonly known as:  KEPPRA  Take 250 mg by mouth every Tuesday, Thursday, and Saturday at 6 PM.     lidocaine-prilocaine cream  Commonly known as:  EMLA  Apply 1 application topically as needed (before dialysis).     pantoprazole 20 MG tablet  Commonly known as:  PROTONIX  Take 1 tablet (20 mg total) by mouth daily. For acid reflux/indigestion.       No Known Allergies Follow-up Information   Schedule an appointment as soon as possible for a visit with Estanislado Emms, MD. (Followup with Dr. Florene Glen at hemodialysis.)    Specialty:  Nephrology   Contact information:   [place] [id number] [phone]        The results of significant diagnostics from this hospitalization (including imaging, microbiology, ancillary and laboratory) are listed below for reference.    Significant Diagnostic Studies: Ct Head Wo Contrast  01/05/2014   CLINICAL DATA:  Headache, history of cervical cancer. History of renal failure, on dialysis.  EXAM: CT HEAD WITHOUT CONTRAST  TECHNIQUE: Contiguous axial images were obtained from the base of the skull through the vertex without intravenous contrast.  COMPARISON:  CT of the head Oct 08, 2013  FINDINGS: The ventricles and sulci are normal. No intraparenchymal hemorrhage, mass effect nor midline shift. No acute large vascular territory infarcts.  No abnormal extra-axial fluid collections. Basal cisterns are patent.  No skull fracture. The included ocular globes and orbital contents are non-suspicious. The mastoid aircells and included paranasal sinuses are well-aerated.  IMPRESSION: No acute intracranial process.  Normal noncontrast CT of the head.   Electronically Signed   By: Sandie Ano  Bloomer   On: 01/05/2014 06:34   US Transvaginal Non-ob  01/05/2014   CLINICAL DATA:  Lower abdominal pain with vaginal discharge  EXAM: TRANSABDOMINAL AND TRANSVAGINAL ULTRASOUND OF PELVIS  TECHNIQUE: Both transabdominal and transvaginal ultrasound examinations of the pelvis were performed. Transabdominal technique was performed for global imaging of the pelvis including uterus, ovaries, adnexal regions, and pelvic cul-de-sac. It was necessary to proceed with endovaginal exam following the transabdominal exam to visualize the uterus and adnexae in better detail.  COMPARISON:  None  FINDINGS: Uterus  Measurements: 7.4 x 3.2 x 5.5 cm. No focal abnormality or fibroid. Minimally complex septated cervical nabothian cysts suspected along the endocervical canal.  Endometrium  Thickness: 3 mm.  No focal abnormality visualized.  Right ovary  Measurements: 2.6 x 2.1  x 2.3 cm. Normal appearance/no adnexal mass.  Left ovary  Measurements: 2.4 x 1.7 1.9 cm. Normal appearance/no adnexal mass.  Other findings  No free fluid.  IMPRESSION: No acute finding by pelvic ultrasound.   Electronically Signed   By: Daryll Brod M.D.   On: 01/05/2014 08:57   US Pelvis Complete  01/05/2014   CLINICAL DATA:  Lower abdominal pain with vaginal discharge  EXAM: TRANSABDOMINAL AND TRANSVAGINAL ULTRASOUND OF PELVIS  TECHNIQUE: Both transabdominal and transvaginal ultrasound examinations of the pelvis were performed. Transabdominal technique was performed for global imaging of the pelvis including uterus, ovaries, adnexal regions, and pelvic cul-de-sac. It was necessary to proceed with endovaginal exam following the transabdominal exam to visualize the uterus and adnexae in better detail.  COMPARISON:  None  FINDINGS: Uterus  Measurements: 7.4 x 3.2 x 5.5 cm. No focal abnormality or fibroid. Minimally complex septated cervical nabothian cysts suspected along the endocervical canal.  Endometrium  Thickness: 3 mm.  No focal abnormality visualized.  Right ovary  Measurements: 2.6 x 2.1 x 2.3 cm. Normal appearance/no adnexal mass.  Left ovary  Measurements: 2.4 x 1.7 1.9 cm. Normal appearance/no adnexal mass.  Other findings  No free fluid.  IMPRESSION: No acute finding by pelvic ultrasound.   Electronically Signed   By: Daryll Brod M.D.   On: 01/05/2014 08:57   Dg Chest Portable 1 View  01/05/2014   CLINICAL DATA:  Chest pain and headache  EXAM: PORTABLE CHEST - 1 VIEW  COMPARISON:  03/19/2013  FINDINGS: The heart size has normalized since previous, likely decreased or resolved pericardial effusion. Negative upper mediastinal contours. There is no edema, consolidation, effusion, or pneumothorax.  IMPRESSION: No active disease.   Electronically Signed   By: Jorje Guild M.D.   On: 01/05/2014 05:35    Microbiology: No results found for this or any previous visit (from the past 240 hour(s)).    Labs: Basic Metabolic Panel:  Recent Labs Lab 01/05/14 0455 01/06/14 0622 01/07/14 0736 01/07/14 1623  NA 138 139 138 138  K 5.1 5.2 5.9* 4.5  CL 91* 95* 95* 93*  CO2 24 31 29  32  GLUCOSE 83 82 82 111*  BUN 51* 13 29* 7  CREATININE 9.71* 4.64* 7.64* 3.55*  CALCIUM 10.9* 9.5 9.8 9.8  PHOS  --  3.8  --   --    Liver Function Tests:  Recent Labs Lab 01/05/14 0455  AST 14  ALT 8  ALKPHOS 86  BILITOT 0.4  PROT 7.7  ALBUMIN 4.1    Recent Labs Lab 01/05/14 0903 01/07/14 0736  LIPASE 125* 68*   No results found for this basename: AMMONIA,  in the last 168 hours CBC:  Recent Labs Lab 01/05/14 0455 01/06/14 0622 01/07/14 0736  WBC 4.9 3.4* 3.2*  NEUTROABS 2.9  --   --   HGB 11.0* 11.1* 10.3*  HCT 32.9* 33.6* 31.5*  MCV 95.6 97.7 97.8  PLT 208 167 170   Cardiac Enzymes:  Recent Labs Lab 01/05/14 0455 01/05/14 0903 01/05/14 1404 01/05/14 2256  TROPONINI <0.30 <0.30 <0.30 <0.30   BNP: BNP (last 3 results) No results found for this basename: PROBNP,  in the last 8760 hours CBG: No results found for this basename: GLUCAP,  in the last 168 hours     Signed:  Selene Peltzer  [facility] 01/07/2014, 5:38 PM

## 2014-02-07 ENCOUNTER — Encounter (HOSPITAL_COMMUNITY): Payer: Self-pay | Admitting: Emergency Medicine

## 2014-02-07 ENCOUNTER — Emergency Department (HOSPITAL_COMMUNITY): Payer: Medicaid Other

## 2014-02-07 ENCOUNTER — Emergency Department (HOSPITAL_COMMUNITY)
Admission: EM | Admit: 2014-02-07 | Discharge: 2014-02-07 | Disposition: A | Discharge status: Home / Self Care | Payer: Medicaid Other | Attending: Emergency Medicine | Admitting: Emergency Medicine

## 2014-02-07 DIAGNOSIS — Z9089 Acquired absence of other organs: Secondary | ICD-10-CM | POA: Diagnosis not present

## 2014-02-07 DIAGNOSIS — Z8719 Personal history of other diseases of the digestive system: Secondary | ICD-10-CM | POA: Diagnosis not present

## 2014-02-07 DIAGNOSIS — I509 Heart failure, unspecified: Secondary | ICD-10-CM | POA: Diagnosis not present

## 2014-02-07 DIAGNOSIS — R1110 Vomiting, unspecified: Secondary | ICD-10-CM | POA: Insufficient documentation

## 2014-02-07 DIAGNOSIS — Z992 Dependence on renal dialysis: Secondary | ICD-10-CM | POA: Insufficient documentation

## 2014-02-07 DIAGNOSIS — Z8742 Personal history of other diseases of the female genital tract: Secondary | ICD-10-CM | POA: Insufficient documentation

## 2014-02-07 DIAGNOSIS — I129 Hypertensive chronic kidney disease with stage 1 through stage 4 chronic kidney disease, or unspecified chronic kidney disease: Secondary | ICD-10-CM | POA: Insufficient documentation

## 2014-02-07 DIAGNOSIS — Z862 Personal history of diseases of the blood and blood-forming organs and certain disorders involving the immune mechanism: Secondary | ICD-10-CM | POA: Diagnosis not present

## 2014-02-07 DIAGNOSIS — R1033 Periumbilical pain: Secondary | ICD-10-CM | POA: Insufficient documentation

## 2014-02-07 DIAGNOSIS — N189 Chronic kidney disease, unspecified: Secondary | ICD-10-CM | POA: Insufficient documentation

## 2014-02-07 DIAGNOSIS — Z8701 Personal history of pneumonia (recurrent): Secondary | ICD-10-CM | POA: Insufficient documentation

## 2014-02-07 DIAGNOSIS — M329 Systemic lupus erythematosus, unspecified: Secondary | ICD-10-CM | POA: Diagnosis not present

## 2014-02-07 DIAGNOSIS — G40909 Epilepsy, unspecified, not intractable, without status epilepticus: Secondary | ICD-10-CM | POA: Diagnosis not present

## 2014-02-07 DIAGNOSIS — R109 Unspecified abdominal pain: Secondary | ICD-10-CM

## 2014-02-07 DIAGNOSIS — Z79899 Other long term (current) drug therapy: Secondary | ICD-10-CM | POA: Diagnosis not present

## 2014-02-07 DIAGNOSIS — Z9889 Other specified postprocedural states: Secondary | ICD-10-CM | POA: Insufficient documentation

## 2014-02-07 LAB — COMPREHENSIVE METABOLIC PANEL WITH GFR
ALT: 10 U/L (ref 0–35)
AST: 25 U/L (ref 0–37)
Albumin: 4 g/dL (ref 3.5–5.2)
Alkaline Phosphatase: 95 U/L (ref 39–117)
Anion gap: 14 (ref 5–15)
BUN: 25 mg/dL — ABNORMAL HIGH (ref 6–23)
CO2: 34 meq/L — ABNORMAL HIGH (ref 19–32)
Calcium: 9.3 mg/dL (ref 8.4–10.5)
Chloride: 90 meq/L — ABNORMAL LOW (ref 96–112)
Creatinine, Ser: 5.5 mg/dL — ABNORMAL HIGH (ref 0.50–1.10)
GFR calc Af Amer: 11 mL/min — ABNORMAL LOW
GFR calc non Af Amer: 9 mL/min — ABNORMAL LOW
Glucose, Bld: 73 mg/dL (ref 70–99)
Potassium: 3.5 meq/L — ABNORMAL LOW (ref 3.7–5.3)
Sodium: 138 meq/L (ref 137–147)
Total Bilirubin: 0.4 mg/dL (ref 0.3–1.2)
Total Protein: 7.7 g/dL (ref 6.0–8.3)

## 2014-02-07 LAB — CBC WITH DIFFERENTIAL/PLATELET
Basophils Absolute: 0 10*3/uL (ref 0.0–0.1)
Basophils Relative: 0 % (ref 0–1)
Eosinophils Absolute: 0.1 10*3/uL (ref 0.0–0.7)
Eosinophils Relative: 3 % (ref 0–5)
HCT: 36.8 % (ref 36.0–46.0)
Hemoglobin: 12.2 g/dL (ref 12.0–15.0)
Lymphocytes Relative: 18 % (ref 12–46)
Lymphs Abs: 0.9 10*3/uL (ref 0.7–4.0)
MCH: 32.4 pg (ref 26.0–34.0)
MCHC: 33.2 g/dL (ref 30.0–36.0)
MCV: 97.6 fL (ref 78.0–100.0)
Monocytes Absolute: 0.5 10*3/uL (ref 0.1–1.0)
Monocytes Relative: 10 % (ref 3–12)
Neutro Abs: 3.3 10*3/uL (ref 1.7–7.7)
Neutrophils Relative %: 69 % (ref 43–77)
Platelets: 187 10*3/uL (ref 150–400)
RBC: 3.77 MIL/uL — ABNORMAL LOW (ref 3.87–5.11)
RDW: 13.7 % (ref 11.5–15.5)
WBC: 4.8 10*3/uL (ref 4.0–10.5)

## 2014-02-07 LAB — TROPONIN I: Troponin I: <0.3 ng/mL

## 2014-02-07 LAB — I-STAT CG4 LACTIC ACID, ED: Lactic Acid, Venous: 0.79 mmol/L (ref 0.5–2.2)

## 2014-02-07 LAB — LIPASE, BLOOD: Lipase: 62 U/L — ABNORMAL HIGH (ref 11–59)

## 2014-02-07 MED ORDER — SODIUM CHLORIDE 0.9 % IJ SOLN
INTRAMUSCULAR | Status: AC
  Filled 2014-02-07: qty 60

## 2014-02-07 MED ORDER — ONDANSETRON HCL 4 MG PO TABS
4.0000 mg | ORAL_TABLET | Freq: Four times a day (QID) | ORAL | Status: DC
Start: 2014-02-07 — End: 2014-08-20

## 2014-02-07 MED ORDER — MORPHINE SULFATE 4 MG/ML IJ SOLN
4.0000 mg | Freq: Once | INTRAMUSCULAR | Status: AC
  Administered 2014-02-07: 4 mg via INTRAVENOUS
  Filled 2014-02-07: qty 1

## 2014-02-07 MED ORDER — SODIUM CHLORIDE 0.9 % IJ SOLN
INTRAMUSCULAR | Status: AC
  Filled 2014-02-07: qty 200

## 2014-02-07 MED ORDER — ONDANSETRON HCL 4 MG/2ML IJ SOLN
4.0000 mg | Freq: Once | INTRAMUSCULAR | Status: AC
  Administered 2014-02-07: 4 mg via INTRAVENOUS
  Filled 2014-02-07: qty 2

## 2014-02-07 MED ORDER — IOHEXOL 300 MG/ML  SOLN
50.0000 mL | INTRAMUSCULAR | Status: AC
  Administered 2014-02-07: 50 mL via ORAL

## 2014-02-07 NOTE — Discharge Instructions (Signed)
Dolor abdominal (Abdominal Pain) Follow up with your doctor this week. Return to the ED if you develop new or worsening symptoms.  El dolor puede tener muchas causas. Normalmente la causa del dolor abdominal no es una enfermedad y Teacher, English as a foreign language sin Clinical research associate. Frecuentemente puede controlarse y tratarse en casa. Su mdico le Chartered certified accountant examen fsico y posiblemente solicite anlisis de sangre y radiografas para ayudar a Teacher, adult education la gravedad de su dolor. Sin embargo, en [employer], debe transcurrir ms tiempo antes de que se pueda Pension scheme manager una causa evidente del dolor. Antes de llegar a ese punto, es posible que su mdico no sepa si necesita ms pruebas o un tratamiento ms profundo. INSTRUCCIONES PARA EL CUIDADO EN EL HOGAR  Est atento al dolor para ver si hay cambios. Las siguientes indicaciones ayudarn a Chief Strategy Officer que pueda sentir:  [place] solo medicamentos de venta libre o recetados, segn las indicaciones del mdico.  No tome laxantes a menos que se lo haya indicado su mdico.  Pruebe con Ardelia Mems dieta lquida absoluta (caldo, t o agua) segn se lo indique su mdico. Introduzca gradualmente una dieta normal, segn su tolerancia. SOLICITE ATENCIN MDICA SI:  Tiene dolor abdominal sin explicacin.  Tiene dolor abdominal relacionado con nuseas o diarrea.  Tiene dolor cuando orina o defeca.  Experimenta dolor abdominal que lo despierta de noche.  Tiene dolor abdominal que empeora o mejora cuando come alimentos.  Tiene dolor abdominal que empeora cuando come alimentos grasosos.  Tiene fiebre. SOLICITE ATENCIN MDICA DE INMEDIATO SI:   El dolor no desaparece en un plazo mximo de 2horas.  No deja de (vomitar).  El Social research officer, government se siente solo en partes del abdomen, como el lado derecho o la parte inferior izquierda del abdomen.  Evaca materia fecal sanguinolenta o negra, de aspecto alquitranado. ASEGRESE DE QUE:  Comprende estas instrucciones.  Controlar su  afeccin.  Recibir ayuda de inmediato si no mejora o si empeora. Document Released: 05/05/2005 Document Revised: 05/10/2013 [facility] Patient Information 2015 [place]. This information is not intended to replace advice given to you by your health care provider. Make sure you discuss any questions you have with your health care provider.

## 2014-02-07 NOTE — ED Notes (Signed)
Not able to obtain urine as per order. Patient is anuric. MD notified

## 2014-02-07 NOTE — ED Provider Notes (Signed)
CSN: [id number]     Arrival date & time 02/07/14  1350 History  This chart was scribed for No att. providers found by Donato Schultz, ED Scribe. This patient was seen in room APA10/APA10 and the patient's care was started at 2:32 PM.     Chief Complaint  Patient presents with  . Emesis    The history is provided by the patient. A language interpreter was used.  HPI Comments: Maria Vazquez is a 38 y.o. female with a history of Lupus and renal failure who presents to the Emergency Department complaining of constant vomiting that started 3 days ago.  She has been vomiting twice a day since her symptoms started.  She can eat and drink but will vomit shortly after.  She has experienced these symptoms a year ago and suspects that it may have been caused by her Lupus.  She was admitted to the hospital and was given medication to treat her Lupus.  She lists intermittent lower abdominal pain, confusion, headache, and aching pain in her fingers bilaterally as associated symptoms. She states that not eating alleviates her abdominal pain.  She denies fever, abnormal bowel movements, hematemesis, and chest pain as associated symptoms.  She does not urinate normally due to her renal failure.  She started dialysis three years ago and her last dialysis was this morning.  She goes 3 times a week and has not missed any dialysis appointments recently.   She does not take Coumadin.  She has a history of appendectomy.  She has had a cholecystectomy.  She does not have any allergies to medication.  Her PCP is Dr. Florene Glen.      Past Medical History  Diagnosis Date  . Gallstone pancreatitis Feb 2011  . Cervix carcinoma in situ Mar 2011  . Proteinuria - cause not known   . Hypertension   . Lupus   . CHF (congestive heart failure)   . Anemia   . Thrombocytopenia   . Blood transfusion   . Dialysis patient     tues, thursday, saturday.  . Renal failure, acute on chronic   . Chronic kidney disease     T, Th,  Sat  . Lupus nephritis   . Renal insufficiency   . Seizure disorder 03/19/2013  . Pneumonia due to organism 03/02/2011   Past Surgical History  Procedure Laterality Date  . Appendectomy    . Cervical cone biopsy    . Cesarean section    . Cholecystectomy    . Embolectomy  10/10/2011    Procedure: EMBOLECTOMY;  Surgeon: Angelia Mould, MD;  Location: [place];  Service: Vascular;  Laterality: Right;  . Av fistula placement    . Av fistula placement  07/18/2011    Procedure: ARTERIOVENOUS (AV) FISTULA CREATION;  Surgeon: Angelia Mould, MD;  Location: [place];  Service: Vascular;  Laterality: Right;  . Av fistula placement  01/23/12    Revision of Right AVF   Family History  Problem Relation Age of Onset  . Diabetes Mother   . Diabetes Brother   . Diabetes Brother   . Diabetes Maternal Grandmother   . Anesthesia problems Neg Hx    History  Substance Use Topics  . Smoking status: Never Smoker   . Smokeless tobacco: Never Used  . Alcohol Use: No   OB History   Grav Para Term Preterm Abortions TAB SAB Ect Mult Living   4 3   1  1    3  Review of Systems A complete 10 system review of systems was obtained and all systems are negative except as noted in the HPI and PMH.     Allergies  Review of patient's allergies indicates no known allergies.  Home Medications   Prior to Admission medications   Medication Sig Start Date End Date Taking? Authorizing Provider  azaTHIOprine (IMURAN) 50 MG tablet Take 50 mg by mouth daily. 07/29/13 07/29/14 Yes Historical Provider, MD  calcium acetate (PHOSLO) 667 MG capsule Take 1,334 mg by mouth 3 (three) times daily with meals.   Yes Historical Provider, MD  calcium carbonate (TUMS EX) 750 MG chewable tablet Chew 3 tablets by mouth daily.    Yes Historical Provider, MD  cholecalciferol (VITAMIN D) 1000 UNITS tablet Take 1,000 Units by mouth daily.   Yes Historical Provider, MD  hydroxychloroquine (PLAQUENIL) 200 MG tablet Take 200  mg by mouth daily.    Yes Historical Provider, MD  levETIRAcetam (KEPPRA) 250 MG tablet Take 250 mg by mouth every Tuesday, Thursday, and Saturday at 6 PM.    Yes Historical Provider, MD  levETIRAcetam (KEPPRA) 500 MG tablet Take 500 mg by mouth 2 (two) times daily.   Yes Historical Provider, MD  lidocaine-prilocaine (EMLA) cream Apply 1 application topically as needed (before dialysis).   Yes Historical Provider, MD  ondansetron (ZOFRAN) 4 MG tablet Take 1 tablet (4 mg total) by mouth every 6 (six) hours. 02/07/14   Ezequiel Essex, MD   BP 108/66  Pulse 74  Temp(Src) 98.1 F (36.7 C) (Oral)  Resp 20  Ht 5\' 6"  (1.676 m)  Wt 130 lb (58.968 kg)  BMI 20.99 kg/m2  SpO2 99%  LMP 02/02/2014 Physical Exam  Nursing note and vitals reviewed. Constitutional: She is oriented to person, place, and time. She appears well-developed and well-nourished. No distress.  HENT:  Head: Normocephalic and atraumatic.  Mouth/Throat: Oropharynx is clear and moist. No oropharyngeal exudate.  Eyes: Conjunctivae and EOM are normal. Pupils are equal, round, and reactive to light.  Neck: Normal range of motion. Neck supple.  No meningismus.  Cardiovascular: Normal rate, regular rhythm, normal heart sounds and intact distal pulses.   No murmur heard. Pulmonary/Chest: Effort normal and breath sounds normal. No respiratory distress.  Abdominal: Soft. There is tenderness. There is no rebound and no guarding.  Periumbilical tenderness.    Musculoskeletal: Normal range of motion. She exhibits no edema and no tenderness.  Neurological: She is alert and oriented to person, place, and time. No cranial nerve deficit. She exhibits normal muscle tone. Coordination normal.  No ataxia on finger to nose bilaterally. No pronator drift. 5/5 strength throughout. CN 2-12 intact. Negative Romberg. Equal grip strength. Sensation intact. Gait is normal.   Skin: Skin is warm.  Right upper extremity AV fistula with intact thrill.      ED Course  Procedures (including critical care time)  DIAGNOSTIC STUDIES: Oxygen Saturation is 100% on room air, normal by my interpretation.    COORDINATION OF CARE: 2:40 PM- Discussed treatment plan with patient at bedside and the patient agreed to the treatment plan.    Labs Review Labs Reviewed  CBC WITH DIFFERENTIAL - Abnormal; Notable for the following:    RBC 3.77 (*)    All other components within normal limits  COMPREHENSIVE METABOLIC PANEL - Abnormal; Notable for the following:    Potassium 3.5 (*)    Chloride 90 (*)    CO2 34 (*)    BUN 25 (*)  Creatinine, Ser 5.50 (*)    GFR calc non Af Amer 9 (*)    GFR calc Af Amer 11 (*)    All other components within normal limits  LIPASE, BLOOD - Abnormal; Notable for the following:    Lipase 62 (*)    All other components within normal limits  TROPONIN I  I-STAT CG4 LACTIC ACID, ED    Imaging Review Ct Abdomen Pelvis Wo Contrast  02/07/2014   CLINICAL DATA:  Dialysis patient.  Mid abdominal pain for 3 weeks.  EXAM: CT ABDOMEN AND PELVIS WITHOUT CONTRAST  TECHNIQUE: Multidetector CT imaging of the abdomen and pelvis was performed following the standard protocol without IV contrast.  COMPARISON:  03/19/2013  FINDINGS: Clear lung bases.  Heart is normal in size.  Normal liver. Multiple splenic calcifications consistent with healed granulomatous disease. This is stable.  Gallbladder surgically absent.  No bile duct dilation.  Normal pancreas.  No adrenal masses.  Bilateral trophic kidneys. No renal masses, stones or hydronephrosis. Bladder is decompressed.  Uterus and adnexa are unremarkable.  No pathologically enlarged lymph nodes. No abnormal fluid collections.  Colon and small bowel are unremarkable.  Normal appendix.  No significant bony abnormality.  IMPRESSION: 1. No acute findings.  No findings to explain abdominal pain. 2. Bilateral atrophic kidneys. No renal masses or stones or hydronephrosis. 3. Stable splenic  calcifications consistent with healed granulomatous disease. 4. Status post cholecystectomy. 5. No other abnormalities.  Normal appendix visualized.   Electronically Signed   By: Lajean Manes M.D.   On: 02/07/2014 17:08   Ct Head Wo Contrast  02/07/2014   CLINICAL DATA:  Persistent headache  EXAM: CT HEAD WITHOUT CONTRAST  TECHNIQUE: Contiguous axial images were obtained from the base of the skull through the vertex without intravenous contrast.  COMPARISON:  January 05, 2014  FINDINGS: The ventricles are normal in size and configuration. There is no mass, hemorrhage, extra-axial fluid collection, or midline shift. Gray-white compartments are normal. There is no demonstrable acute infarct. Bony calvarium appears intact. The mastoid air cells are clear.  IMPRESSION: Study within normal limits. No demonstrable mass, hemorrhage, or focal gray -white compartment lesion.   Electronically Signed   By: Lowella Grip M.D.   On: 02/07/2014 17:09     EKG Interpretation None      MDM   Final diagnoses:  Abdominal pain, unspecified abdominal location   three-day history of vomiting 2 times daily. Nonbilious nonbloody. No fever. No change in bowel habits. Dialysis patient with last treatment today. States has had pain in the past with her lupus. Denies any chest pain or shortness of breath. She does not make urine.  Labs appear to be at baseline. Potassium within normal limits. Lipase 62 which is improved from previous. Patient has lower abdominal pain and no epigastric pain.  Previous discharge summary reviewed. Patient's abdominal pain at that time was attributed to menstrual cycle. She is not on her period now, it ended 5 days ago.  CT scan reassuring. Lactate normal. Patient tolerating by mouth in the ED. She states her abdominal pain has resolved. She is feeling better. Lipase is improved from previous. She has no epigastric pain.  Tolerating PO well.  Will discharge with symptom control. Return  precautions discussed.  BP 108/66  Pulse 74  Temp(Src) 98.1 F (36.7 C) (Oral)  Resp 20  Ht 5\' 6"  (1.676 m)  Wt 130 lb (58.968 kg)  BMI 20.99 kg/m2  SpO2 99%  LMP 02/02/2014  I personally performed the services described in this documentation, which was scribed in my presence. The recorded information has been reviewed and is accurate.   Ezequiel Essex, MD 02/07/14 2340

## 2014-02-07 NOTE — ED Notes (Signed)
Patient complaining of vomiting x 3 days. Denies any other symptoms.

## 2014-03-20 ENCOUNTER — Encounter (HOSPITAL_COMMUNITY): Payer: Self-pay | Admitting: Emergency Medicine

## 2014-04-27 ENCOUNTER — Encounter (HOSPITAL_COMMUNITY): Payer: Self-pay | Admitting: Vascular Surgery

## 2014-08-19 ENCOUNTER — Emergency Department (HOSPITAL_COMMUNITY)
Admission: EM | Admit: 2014-08-19 | Discharge: 2014-08-19 | Disposition: A | Discharge status: Home / Self Care | Payer: Self-pay | Attending: Emergency Medicine | Admitting: Emergency Medicine

## 2014-08-19 ENCOUNTER — Emergency Department (HOSPITAL_COMMUNITY): Payer: Medicaid Other

## 2014-08-19 ENCOUNTER — Encounter (HOSPITAL_COMMUNITY): Payer: Self-pay

## 2014-08-19 DIAGNOSIS — Z8701 Personal history of pneumonia (recurrent): Secondary | ICD-10-CM | POA: Insufficient documentation

## 2014-08-19 DIAGNOSIS — M791 Myalgia: Secondary | ICD-10-CM | POA: Insufficient documentation

## 2014-08-19 DIAGNOSIS — I2510 Atherosclerotic heart disease of native coronary artery without angina pectoris: Secondary | ICD-10-CM | POA: Insufficient documentation

## 2014-08-19 DIAGNOSIS — Z3202 Encounter for pregnancy test, result negative: Secondary | ICD-10-CM | POA: Insufficient documentation

## 2014-08-19 DIAGNOSIS — Z8541 Personal history of malignant neoplasm of cervix uteri: Secondary | ICD-10-CM | POA: Insufficient documentation

## 2014-08-19 DIAGNOSIS — I120 Hypertensive chronic kidney disease with stage 5 chronic kidney disease or end stage renal disease: Secondary | ICD-10-CM | POA: Insufficient documentation

## 2014-08-19 DIAGNOSIS — Z992 Dependence on renal dialysis: Secondary | ICD-10-CM | POA: Insufficient documentation

## 2014-08-19 DIAGNOSIS — G40909 Epilepsy, unspecified, not intractable, without status epilepticus: Secondary | ICD-10-CM | POA: Insufficient documentation

## 2014-08-19 DIAGNOSIS — Z79899 Other long term (current) drug therapy: Secondary | ICD-10-CM | POA: Insufficient documentation

## 2014-08-19 DIAGNOSIS — N186 End stage renal disease: Secondary | ICD-10-CM | POA: Insufficient documentation

## 2014-08-19 DIAGNOSIS — M545 Low back pain: Secondary | ICD-10-CM | POA: Insufficient documentation

## 2014-08-19 DIAGNOSIS — M7918 Myalgia, other site: Secondary | ICD-10-CM

## 2014-08-19 DIAGNOSIS — Z862 Personal history of diseases of the blood and blood-forming organs and certain disorders involving the immune mechanism: Secondary | ICD-10-CM | POA: Insufficient documentation

## 2014-08-19 LAB — BASIC METABOLIC PANEL
Anion gap: 11 (ref 5–15)
BUN: 13 mg/dL (ref 6–23)
CO2: 32 mmol/L (ref 19–32)
CREATININE: 4.32 mg/dL — AB (ref 0.50–1.10)
Calcium: 9.3 mg/dL (ref 8.4–10.5)
Chloride: 96 mmol/L (ref 96–112)
GFR calc Af Amer: 14 mL/min — ABNORMAL LOW (ref >90)
GFR, EST NON AFRICAN AMERICAN: 12 mL/min — AB (ref >90)
GLUCOSE: 97 mg/dL (ref 70–99)
POTASSIUM: 3.2 mmol/L — AB (ref 3.5–5.1)
Sodium: 139 mmol/L (ref 135–145)

## 2014-08-19 LAB — CBC WITH DIFFERENTIAL/PLATELET
BASOS ABS: 0 10*3/uL (ref 0.0–0.1)
Basophils Relative: 1 % (ref 0–1)
EOS PCT: 4 % (ref 0–5)
Eosinophils Absolute: 0.2 10*3/uL (ref 0.0–0.7)
HEMATOCRIT: 41.9 % (ref 36.0–46.0)
Hemoglobin: 13.7 g/dL (ref 12.0–15.0)
LYMPHS PCT: 21 % (ref 12–46)
Lymphs Abs: 1.1 10*3/uL (ref 0.7–4.0)
MCH: 31.2 pg (ref 26.0–34.0)
MCHC: 32.7 g/dL (ref 30.0–36.0)
MCV: 95.4 fL (ref 78.0–100.0)
MONOS PCT: 8 % (ref 3–12)
Monocytes Absolute: 0.4 10*3/uL (ref 0.1–1.0)
Neutro Abs: 3.5 10*3/uL (ref 1.7–7.7)
Neutrophils Relative %: 66 % (ref 43–77)
Platelets: 168 10*3/uL (ref 150–400)
RBC: 4.39 MIL/uL (ref 3.87–5.11)
RDW: 13.6 % (ref 11.5–15.5)
WBC: 5.3 10*3/uL (ref 4.0–10.5)

## 2014-08-19 LAB — HCG, QUANTITATIVE, PREGNANCY: hCG, Beta Chain, Quant, S: 2 m[IU]/mL (ref <5)

## 2014-08-19 MED ORDER — FENTANYL CITRATE 0.05 MG/ML IJ SOLN
100.0000 ug | INTRAMUSCULAR | Status: DC | PRN
  Administered 2014-08-19 (×2): 100 ug via INTRAVENOUS
  Filled 2014-08-19 (×2): qty 2

## 2014-08-19 MED ORDER — ONDANSETRON HCL 4 MG/2ML IJ SOLN
4.0000 mg | Freq: Once | INTRAMUSCULAR | Status: AC
  Administered 2014-08-19: 4 mg via INTRAVENOUS
  Filled 2014-08-19: qty 2

## 2014-08-19 MED ORDER — HYDROCODONE-ACETAMINOPHEN 5-325 MG PO TABS: 1.0000 | ORAL_TABLET | ORAL | Status: DC | PRN

## 2014-08-19 NOTE — Discharge Instructions (Signed)
Use heat on the sore area 3 or 4 times a day.    Dolor msculoesqueltico (Musculoskeletal Pain) El dolor musculoesqueltico se siente en huesos y msculos. El dolor puede ocurrir en cualquier parte del cuerpo. El profesional que lo asiste podr tratarlo sin Pharmacist, community causa del dolor. Lo tratar [employer] de laboratorio (sangre y [place]), las radiografas y [place]. La causa de estos dolores puede ser un virus.  CAUSAS Generalmente no existe una causa definida para este trastorno. Tambin el [employer] [place] [employer] puede deberse a la [place]. En la actividad excesiva se incluye el hacer ejercicios fsicos muy intensos cuando no se est en buena forma. El dolor de huesos tambin puede deberse a cambios climticos. Los huesos son sensibles a los cambios en la presin atmosfrica. [place]  Para proteger su privacidad, no se entregarn los [employer] pruebas por telfono. Asegrese de conseguirlos. Consulte el modo en que podr obtenerlos si no se lo han informado. Es su responsabilidad contar con los [employer].  Utilice los medicamentos de venta libre o de prescripcin para Conservation officer, historic buildings, Health and safety inspector o la [place], segn se lo indique el profesional que lo asiste. Si le han administrado medicamentos, no conduzca, no opere maquinarias ni Teacher, adult education, y tampoco firme documentos legales durante 24 horas. No beba alcohol. No tome pldoras para dormir ni otros medicamentos que Animal nutritionist.  Podr seguir con todas las actividades a menos que stas le ocasionen ms [employer]. Cuando el dolor disminuya, es importante que gradualmente reanude toda la rutina habitual. Retome las actividades comenzando lentamente. Aumente gradualmente la intensidad y la duracin de sus actividades o del ejercicio.  Durante los perodos de dolor intenso, el reposo en cama puede ser beneficioso. Recustese o sintese en la  posicin que le sea ms cmoda.  Coloque hielo sobre la zona afectada.  Ponga hielo en [place].  Colquese una toalla entre la piel y la bolsa de hielo.  Aplique el hielo durante 10 a 20 minutos 3  4 veces por da.  Si el dolor empeora, o no desaparece puede ser [employer] repetir las pruebas o Optometrist nuevos exmenes. El profesional que lo asiste podr requerir investigar ms profundamente para Animator causa posible. SOLICITE ATENCIN MDICA DE INMEDIATO SI:  Siente que el dolor empeora y no se alivia con los medicamentos.  Siente dolor en el pecho asociado a falta de aire, sudoracin, nuseas o vmitos.  El dolor se localiza en el abdomen.  Comienza a sentir nuevos sntomas que parecen ser diferentes o que lo preocupan. ASEGRESE DE QUE:   Comprende las instrucciones para el alta mdica.  Controlar su enfermedad.  Solicitar atencin mdica de inmediato segn las indicaciones. Document Released: 02/12/2005 Document Revised: 07/28/2011 [facility] Patient Information 2015 [place]. This information is not intended to replace advice given to you by your health care provider. Make sure you discuss any questions you have with your health care provider.

## 2014-08-19 NOTE — ED Notes (Signed)
Patient c/o of left sided flank/abdominal pain which started yesterday. Patient is Hemodialysis patient Maria Vazquez, Sat, already completed todays HD.

## 2014-08-19 NOTE — ED Notes (Signed)
MD at bedside. 

## 2014-08-19 NOTE — ED Notes (Signed)
CT requested pregnancy test on pt. Blood lab added on for pt.

## 2014-08-19 NOTE — ED Provider Notes (Signed)
CSN: [id number]     Arrival date & time 08/19/14  1353 History   First MD Initiated Contact with Patient 08/19/14 1503     Chief Complaint  Patient presents with  . Flank Pain     (Consider location/radiation/quality/duration/timing/severity/associated sxs/prior Treatment) HPI   Margretta Goucher is a 39 y.o. female who presents for evaluation of left lower back pain which started today, spontaneously. She's had similar pain in the past. She denies trauma. She dialyzed yesterday. She does not make urine. There is been no fever, chills, cough, shortness of breath, chest pain, nausea or vomiting. The pain is worse with movement and walking. There are no other known modifying factors.   Past Medical History  Diagnosis Date  . Gallstone pancreatitis Feb 2011  . Cervix carcinoma in situ Mar 2011  . Proteinuria - cause not known   . Hypertension   . Lupus   . CHF (congestive heart failure)   . Anemia   . Thrombocytopenia   . Blood transfusion   . Dialysis patient     tues, thursday, saturday.  . Renal failure, acute on chronic   . Chronic kidney disease     T, Th, Sat  . Lupus nephritis   . Renal insufficiency   . Seizure disorder 03/19/2013  . Pneumonia due to organism 03/02/2011   Past Surgical History  Procedure Laterality Date  . Appendectomy    . Cervical cone biopsy    . Cesarean section    . Cholecystectomy    . Embolectomy  10/10/2011    Procedure: EMBOLECTOMY;  Surgeon: Angelia Mould, MD;  Location: [place];  Service: Vascular;  Laterality: Right;  . Av fistula placement    . Av fistula placement  07/18/2011    Procedure: ARTERIOVENOUS (AV) FISTULA CREATION;  Surgeon: Angelia Mould, MD;  Location: [place];  Service: Vascular;  Laterality: Right;  . Av fistula placement  01/23/12    Revision of Right AVF  . Shuntogram Right 07/07/2011    Procedure: SHUNTOGRAM;  Surgeon: Angelia Mould, MD;  Location[facility] C[facility] LAB;  Service: Cardiovascular;   Laterality: Right;  . Shuntogram N/A 12/08/2011    Procedure: Earney Mallet;  Surgeon: Angelia Mould, MD;  Location: [facility] CATH LAB;  Service: Cardiovascular;  Laterality: N/A;  . Fistulogram Right 04/12/2012    Procedure: FISTULOGRAM;  Surgeon: Angelia Mould, MD;  Location: [facility] CATH LAB;  Service: Cardiovascular;  Laterality: Right;   Family History  Problem Relation Age of Onset  . Diabetes Mother   . Diabetes Brother   . Diabetes Brother   . Diabetes Maternal Grandmother   . Anesthesia problems Neg Hx    History  Substance Use Topics  . Smoking status: Never Smoker   . Smokeless tobacco: Never Used  . Alcohol Use: No   OB History    Gravida Para Term Preterm AB TAB SAB Ectopic Multiple Living   4 3   1  1   3      Review of Systems  All other systems reviewed and are negative.     Allergies  Review of patient's allergies indicates no known allergies.  Home Medications   Prior to Admission medications   Medication Sig Start Date End Date Taking? Authorizing Provider  azaTHIOprine (IMURAN) 50 MG tablet Take 25 mg by mouth daily.   Yes Historical Provider, MD  calcium acetate (PHOSLO) 667 MG capsule Take 667-1,334 mg by mouth 3 (three) times daily with meals. Patient takes 3  tablets with meals and 1 tablet with snack   Yes Historical Provider, MD  calcium carbonate (TUMS EX) 750 MG chewable tablet Chew 1 tablet by mouth daily.    Yes Historical Provider, MD  cholecalciferol (VITAMIN D) 1000 UNITS tablet Take 1,000 Units by mouth daily.   Yes Historical Provider, MD  hydroxychloroquine (PLAQUENIL) 200 MG tablet Take 200 mg by mouth daily.    Yes Historical Provider, MD  levETIRAcetam (KEPPRA) 250 MG tablet Take 250 mg by mouth every Tuesday, Thursday, and Saturday at 6 PM.    Yes Historical Provider, MD  levETIRAcetam (KEPPRA) 500 MG tablet Take 500 mg by mouth 2 (two) times daily.   Yes Historical Provider, MD  lidocaine-prilocaine (EMLA) cream Apply 1 application  topically as needed (before dialysis).   Yes Historical Provider, MD  HYDROcodone-acetaminophen (NORCO) 5-325 MG per tablet Take 1 tablet by mouth every 4 (four) hours as needed. 08/19/14   Daleen Bo, MD  ondansetron (ZOFRAN) 4 MG tablet Take 1 tablet (4 mg total) by mouth every 6 (six) hours. Patient not taking: Reported on 08/19/2014 02/07/14   Ezequiel Essex, MD   BP 103/75 mmHg  Pulse 82  Temp(Src) 97.9 F (36.6 C) (Oral)  Resp 16  Ht 5\' 6"  (1.676 m)  Wt 140 lb (63.504 kg)  BMI 22.61 kg/m2  SpO2 100%  LMP 08/11/2014 Physical Exam  Constitutional: She is oriented to person, place, and time. She appears well-developed and well-nourished.  HENT:  Head: Normocephalic and atraumatic.  Right Ear: External ear normal.  Left Ear: External ear normal.  Eyes: Conjunctivae and EOM are normal. Pupils are equal, round, and reactive to light.  Neck: Normal range of motion and phonation normal. Neck supple.  Cardiovascular: Normal rate, regular rhythm and normal heart sounds.   Pulmonary/Chest: Effort normal and breath sounds normal. She exhibits no bony tenderness.  Abdominal: Soft. There is no tenderness.  Genitourinary:  No costo-vertebral angle tenderness to percussion.  Musculoskeletal: Normal range of motion.  Tender left lower lumbar, and left posterior pelvis. No deformities in these areas.   Neurological: She is alert and oriented to person, place, and time. No cranial nerve deficit or sensory deficit. She exhibits normal muscle tone. Coordination normal.  Skin: Skin is warm, dry and intact.  Psychiatric: She has a normal mood and affect. Her behavior is normal. Judgment and thought content normal.  Nursing note and vitals reviewed.   ED Course  Procedures (including critical care time)  Medications  fentaNYL (SUBLIMAZE) injection 100 mcg (100 mcg Intravenous Given 08/19/14 1807)  ondansetron (ZOFRAN) injection 4 mg (4 mg Intravenous Given 08/19/14 1534)    Patient Vitals for  the past 24 hrs:  BP Temp Temp src Pulse Resp SpO2 Height Weight  08/19/14 1800 103/75 mmHg - - 82 16 100 % - -  08/19/14 1730 118/79 mmHg - - 91 - 100 % - -  08/19/14 1700 112/93 mmHg - - 70 - 100 % - -  08/19/14 1630 124/81 mmHg - - 72 - 99 % - -  08/19/14 1600 137/95 mmHg - - 73 14 98 % - -  08/19/14 1408 100/72 mmHg - - - - - - -  08/19/14 1407 - 97.9 F (36.6 C) Oral 84 16 - 5\' 6"  (1.676 m) 140 lb (63.504 kg)    7:35 PM Reevaluation with update and discussion. After initial assessment and treatment, an updated evaluation reveals she feels better at this time. Findings discussed with patient, all questions  answered. Freemon Binford L    Labs Review Labs Reviewed  BASIC METABOLIC PANEL - Abnormal; Notable for the following:    Potassium 3.2 (*)    Creatinine, Ser 4.32 (*)    GFR calc non Af Amer 12 (*)    GFR calc Af Amer 14 (*)    All other components within normal limits  CBC WITH DIFFERENTIAL/PLATELET  HCG, QUANTITATIVE, PREGNANCY    Imaging Review Ct Renal Stone Study  08/19/2014   CLINICAL DATA:  Left-sided flank and abdominal pain since yesterday.  EXAM: CT ABDOMEN AND PELVIS WITHOUT CONTRAST  TECHNIQUE: Multidetector CT imaging of the abdomen and pelvis was performed following the standard protocol without IV contrast.  COMPARISON:  02/07/2014  FINDINGS: Lower chest:  Unremarkable.  Hepatobiliary: No mass visualized on this unenhanced exam. Prior cholecystectomy again noted.  Pancreas: No mass or inflammatory process visualized on this unenhanced exam.  Spleen: Within normal limits in size. Numerous tiny calcified splenic granulomata again seen.  Adrenal Glands:  No masses identified.  Kidneys/Urinary tract: No evidence of urolithiasis or hydronephrosis. Diffuse bilateral renal atrophy again seen, consistent with end-stage renal disease.  Stomach/Bowel/Peritoneum:  Unremarkable.  Vascular/Lymphatic: No pathologically enlarged lymph nodes identified. No other significant  abnormality identified.  Reproductive:  No mass or other significant abnormality noted.  Other:  None.  Musculoskeletal:  No suspicious bone lesions identified.  IMPRESSION: No evidence of urolithiasis, hydronephrosis, or other acute findings.  Stable diffuse bilateral renal atrophy, consistent with end-stage renal disease. No evidence of hydronephrosis.   Electronically Signed   By: Earle Gell M.D.   On: 08/19/2014 18:21     EKG Interpretation None      MDM   Final diagnoses:  Musculoskeletal pain    Evaluations so with sickle cell pain. Doubt lumbar radiculopathy or spinal myelopathy. No evidence for intra-abdominal processes.  Nursing Notes Reviewed/ Care Coordinated Applicable Imaging Reviewed Interpretation of Laboratory Data incorporated into ED treatment  The patient appears reasonably screened and/or stabilized for discharge and I doubt any other medical condition or other [facility] requiring further screening, evaluation, or treatment in the ED at this time prior to discharge.  Plan: Home Medications- Norco; Home Treatments- rest, heat; return here if the recommended treatment, does not improve the symptoms; Recommended follow up- PCP prn     Daleen Bo, MD 08/19/14 1935

## 2014-08-20 ENCOUNTER — Emergency Department (HOSPITAL_COMMUNITY)
Admission: EM | Admit: 2014-08-20 | Discharge: 2014-08-21 | Disposition: A | Discharge status: Home / Self Care | Payer: Medicaid Other | Attending: Emergency Medicine | Admitting: Emergency Medicine

## 2014-08-20 ENCOUNTER — Encounter (HOSPITAL_COMMUNITY): Payer: Self-pay

## 2014-08-20 DIAGNOSIS — Z992 Dependence on renal dialysis: Secondary | ICD-10-CM | POA: Insufficient documentation

## 2014-08-20 DIAGNOSIS — G40909 Epilepsy, unspecified, not intractable, without status epilepticus: Secondary | ICD-10-CM | POA: Insufficient documentation

## 2014-08-20 DIAGNOSIS — I509 Heart failure, unspecified: Secondary | ICD-10-CM | POA: Insufficient documentation

## 2014-08-20 DIAGNOSIS — I120 Hypertensive chronic kidney disease with stage 5 chronic kidney disease or end stage renal disease: Secondary | ICD-10-CM | POA: Insufficient documentation

## 2014-08-20 DIAGNOSIS — Z79899 Other long term (current) drug therapy: Secondary | ICD-10-CM | POA: Insufficient documentation

## 2014-08-20 DIAGNOSIS — Z8719 Personal history of other diseases of the digestive system: Secondary | ICD-10-CM | POA: Insufficient documentation

## 2014-08-20 DIAGNOSIS — Z8701 Personal history of pneumonia (recurrent): Secondary | ICD-10-CM | POA: Insufficient documentation

## 2014-08-20 DIAGNOSIS — N186 End stage renal disease: Secondary | ICD-10-CM | POA: Insufficient documentation

## 2014-08-20 DIAGNOSIS — Z862 Personal history of diseases of the blood and blood-forming organs and certain disorders involving the immune mechanism: Secondary | ICD-10-CM | POA: Insufficient documentation

## 2014-08-20 DIAGNOSIS — Z8541 Personal history of malignant neoplasm of cervix uteri: Secondary | ICD-10-CM | POA: Insufficient documentation

## 2014-08-20 DIAGNOSIS — B029 Zoster without complications: Secondary | ICD-10-CM | POA: Insufficient documentation

## 2014-08-20 MED ORDER — OXYCODONE-ACETAMINOPHEN 5-325 MG PO TABS: 1.0000 | ORAL_TABLET | ORAL | Status: DC | PRN

## 2014-08-20 MED ORDER — OXYCODONE-ACETAMINOPHEN 5-325 MG PO TABS
1.0000 | ORAL_TABLET | Freq: Once | ORAL | Status: AC
  Administered 2014-08-20: 1 via ORAL
  Filled 2014-08-20: qty 1

## 2014-08-20 MED ORDER — DIAZEPAM 5 MG/ML IJ SOLN
10.0000 mg | Freq: Once | INTRAMUSCULAR | Status: AC
  Administered 2014-08-20: 10 mg via INTRAMUSCULAR

## 2014-08-20 MED ORDER — VALACYCLOVIR HCL 500 MG PO TABS
1000.0000 mg | ORAL_TABLET | Freq: Once | ORAL | Status: DC
Start: 2014-08-20 — End: 2014-08-20

## 2014-08-20 MED ORDER — VALACYCLOVIR HCL 500 MG PO TABS
500.0000 mg | ORAL_TABLET | Freq: Once | ORAL | Status: AC
Start: 2014-08-20 — End: 2014-08-20
  Administered 2014-08-20: 500 mg via ORAL
  Filled 2014-08-20: qty 1

## 2014-08-20 MED ORDER — VALACYCLOVIR HCL 1 G PO TABS: 500.0000 mg | ORAL_TABLET | Freq: Every day | ORAL | Status: AC

## 2014-08-20 MED ORDER — KETOROLAC TROMETHAMINE 60 MG/2ML IM SOLN
60.0000 mg | Freq: Once | INTRAMUSCULAR | Status: AC
  Administered 2014-08-20: 60 mg via INTRAMUSCULAR
  Filled 2014-08-20: qty 2

## 2014-08-20 MED ORDER — DIAZEPAM 5 MG/ML IJ SOLN
10.0000 mg | Freq: Once | INTRAMUSCULAR | Status: DC
  Filled 2014-08-20: qty 2

## 2014-08-20 NOTE — ED Notes (Signed)
Pt c/o left lower back and left upper leg pain that started 30 minutes PTA. Patient denies injury.

## 2014-08-20 NOTE — Discharge Instructions (Signed)
Call the dialysis center in the morning to let them know that you have shingles. They will likely need to alter your schedule because you have the shingles. Start the prescription called Valtrex, tomorrow to help treat the shingles. Stop taking the hydrocodone that you were prescribed last night.   Culebrilla (Shingles) La culebrilla (herpes zoster) es una infeccin causada por el mismo virus que causa la varicela. La infeccin causa una erupcin y ampollas dolorosas llenas de lquido en la piel, las cuales se abren, forman [place] y [place] se curan. Puede ocurrir en cualquier parte del cuerpo, pero por lo general afecta solo un lado del cuerpo o del rostro. El dolor de la culebrilla suele durar alrededor de 1 mes. Sin embargo, Psychologist, clinical con culebrilla pueden sufrir dolor de larga duracin (crnico) en la zona del cuerpo afectada. La culebrilla suele ocurrir muchos aos despus de que la persona tuvo varicela. Es ms frecuente:  En Special educational needs teacher de 50 aos.  En los que tienen el sistema inmunolgico debilitado, como en los enfermos con VIH, sida o cncer.  En pacientes que toman medicamentos que debilitan el sistema inmunolgico, como los medicamentos que se indican en caso de transplantes.  En personas sometidas a un gran estrs. CAUSAS  La causa de la culebrilla es el virus varicela zoster (VZV), que tambin causa la varicela. Despus de que una persona se infecta con el virus, Agricultural consultant en su cuerpo durante aos en un estado inactivo (latente). Para causar la culebrilla, el virus se reactiva e irrumpe como una infeccin en una raz nerviosa. El virus se puede transmitir de persona a Nurse, learning disability (es Theatre stage manager) a Designer, jewellery del Diplomatic Services operational officer con las ampollas abiertas de la erupcin que causa la culebrilla. Solo se contagiar a las personas que no han padecido varicela. Cuando estas personas tienen exposicin al virus, pueden sufrir varicela. No van a desarrollar culebrilla. Una vez que  las ampollas cicatrizan, la persona ya no [place] y no puede transmitir el virus a Producer, television/film/video. SIGNOS Y SNTOMAS  La culebrilla aparece en etapas. Los sntomas iniciales pueden ser dolor, picazn y sensacin de hormigueo en un rea de la piel. Este dolor se describe generalmente como ardor, punzante o pulstil. En unos [place] o semanas aparece una erupcin roja dolorosa en el rea donde se sinti dolor, picazn y hormigueo. La erupcin generalmente aparece en un lado del cuerpo en un patrn de bandas o semejante a una correa. Luego la erupcin por lo general se convierte en un grupo de ampollas llenas de lquido. Estas ampollas forman Serita Grammes y se secan en aproximadamente 2 a 3 semanas. Con los sntomas iniciales, la erupcin o las ampollas tambin puede haber sntomas similares a la gripe. Estos pueden incluir:  Cristy Hilts.  Escalofros.  Dolor de [place].  Malestar estomacal. DIAGNSTICO  El mdico le har un examen de la piel para diagnosticar la [place]. Tambin podrn tomarle muestras de piel o de fluido de las ampollas. Esta muestra se examina bajo el microscopio o se enva al laboratorio para su anlisis posterior. TRATAMIENTO  No existe un tratamiento especfico para la culebrilla. El mdico probablemente le recete medicamentos para ayudarlo a Financial controller, a recuperarse ms rpido y a Customer service manager a Barrister's clerk. Puede incluir medicamentos antivirales, anti-inflamatorios y analgsicos. [place] un bao de agua fra o aplique compresas fras en el rea de la erupcin o ampollas segn lo indicado. Esto disminuir Conservation officer, historic buildings y la  picazn.  Tome los medicamentos solamente como se lo haya indicado el mdico.  Haga reposo segn las indicaciones del mdico.  Mantenga la erupcin y las ampollas limpias con jabn suave y agua fresca o como se lo indique el mdico.  No pellizque las ampollas ni se rasque en la zona de la erupcin.  Aplique una crema para calmar la picazn o cremas anestsicas en el rea afectada segn las indicaciones del mdico.  Mantenga la erupcin cubierta con un vendaje suelto (apsito).  Evite el contacto con:  Bebs.  Embarazadas.  Nios con eczema.  Personas mayores que han recibido un transplante.  Personas con enfermedades crnicas, como leucemia y sida.  Use ropa holgada para ayudar a Best boy del roce con la erupcin.  Concurra a todas las visitas de control como se lo haya indicado el mdico. Si la zona afectada est en el rostro, podrn derivarlo a un especialista, como un mdico especialista en ojos (oftalmlogo) o en odos, nariz y Investment banker, operational (otorrinolaringlogo). Concurrir a todas las visitas de control lo ayudar a Publishing rights manager en los ojos, dolor crnico y discapacidad. SOLICITE ATENCIN MDICA DE INMEDIATO SI:   Tiene dolor en el rostro, en la zona de los ojos o tiene prdida de sensibilidad en un lado del rostro.  Siente dolor o zumbido en el odo.  Tiene prdida del gusto.  El dolor no se alivia con los medicamentos prescritos.  La irritacin o la hinchazn se extienden.  Tiene ms dolor e inflamacin.  La afeccin empeora o ha [place].  Tiene fiebre. ASEGRESE DE QUE:  Comprende estas instrucciones.  Controlar su afeccin.  Recibir ayuda de inmediato si no mejora o si empeora. Document Released: 02/12/2005 Document Revised: 09/19/2013 [facility] Patient Information 2015 [place]. This information is not intended to replace advice given to you by your health care provider. Make sure you discuss any questions you have with your health care provider.

## 2014-08-20 NOTE — ED Provider Notes (Signed)
CSN: [id number]     Arrival date & time 08/20/14  2104 History   First MD Initiated Contact with Patient 08/20/14 2207     Chief Complaint  Patient presents with  . Leg Pain     (Consider location/radiation/quality/duration/timing/severity/associated sxs/prior Treatment) HPI   Maria Vazquez is a 39 y.o. female who is here for evaluation of rash on leg. The rash started today. She was seen by me here yesterday with left lower back pain. She does not have a rash at that time. She was treated for musculoskeletal pain.  She denies fever, weakness or dizziness. She is taking Norco, without improvement of the pain.  Past Medical History  Diagnosis Date  . Gallstone pancreatitis Feb 2011  . Cervix carcinoma in situ Mar 2011  . Proteinuria - cause not known   . Hypertension   . Lupus   . CHF (congestive heart failure)   . Anemia   . Thrombocytopenia   . Blood transfusion   . Dialysis patient     tues, thursday, saturday.  . Renal failure, acute on chronic   . Chronic kidney disease     T, Th, Sat  . Lupus nephritis   . Renal insufficiency   . Seizure disorder 03/19/2013  . Pneumonia due to organism 03/02/2011   Past Surgical History  Procedure Laterality Date  . Appendectomy    . Cervical cone biopsy    . Cesarean section    . Cholecystectomy    . Embolectomy  10/10/2011    Procedure: EMBOLECTOMY;  Surgeon: Angelia Mould, MD;  Location: [place];  Service: Vascular;  Laterality: Right;  . Av fistula placement    . Av fistula placement  07/18/2011    Procedure: ARTERIOVENOUS (AV) FISTULA CREATION;  Surgeon: Angelia Mould, MD;  Location: [place];  Service: Vascular;  Laterality: Right;  . Av fistula placement  01/23/12    Revision of Right AVF  . Shuntogram Right 07/07/2011    Procedure: SHUNTOGRAM;  Surgeon: Angelia Mould, MD;  [facility];  Service: Cardiovascular;  Laterality: Right;  . Shuntogram N/A 12/08/2011    Procedure: Earney Mallet;   Surgeon: Angelia Mould, MD;  Location: [facility] CATH LAB;  Service: Cardiovascular;  Laterality: N/A;  . Fistulogram Right 04/12/2012    Procedure: FISTULOGRAM;  Surgeon: Angelia Mould, MD;  Location: [facility] CATH LAB;  Service: Cardiovascular;  Laterality: Right;   Family History  Problem Relation Age of Onset  . Diabetes Mother   . Diabetes Brother   . Diabetes Brother   . Diabetes Maternal Grandmother   . Anesthesia problems Neg Hx    History  Substance Use Topics  . Smoking status: Never Smoker   . Smokeless tobacco: Never Used  . Alcohol Use: No   OB History    Gravida Para Term Preterm AB TAB SAB Ectopic Multiple Living   4 3   1  1   3      Review of Systems  All other systems reviewed and are negative.     Allergies  Review of patient's allergies indicates no known allergies.  Home Medications   Prior to Admission medications   Medication Sig Start Date End Date Taking? Authorizing Provider  azaTHIOprine (IMURAN) 50 MG tablet Take 25 mg by mouth daily.   Yes Historical Provider, MD  calcium acetate (PHOSLO) 667 MG capsule Take 667-1,334 mg by mouth 3 (three) times daily with meals. Patient takes 3 tablets with meals and 1 tablet  with snack   Yes Historical Provider, MD  calcium carbonate (TUMS EX) 750 MG chewable tablet Chew 1 tablet by mouth daily.    Yes Historical Provider, MD  cholecalciferol (VITAMIN D) 1000 UNITS tablet Take 1,000 Units by mouth daily.   Yes Historical Provider, MD  hydroxychloroquine (PLAQUENIL) 200 MG tablet Take 200 mg by mouth daily.    Yes Historical Provider, MD  levETIRAcetam (KEPPRA) 250 MG tablet Take 250 mg by mouth every Tuesday, Thursday, and Saturday at 6 PM. ON DIALYSIS DAYS   Yes Historical Provider, MD  levETIRAcetam (KEPPRA) 500 MG tablet Take 500 mg by mouth 2 (two) times daily.   Yes Historical Provider, MD  lidocaine-prilocaine (EMLA) cream Apply 1 application topically as needed (before dialysis).   Yes Historical  Provider, MD  HYDROcodone-acetaminophen (NORCO) 5-325 MG per tablet Take 1 tablet by mouth every 4 (four) hours as needed. 08/19/14   Daleen Bo, MD  oxyCODONE-acetaminophen (PERCOCET) 5-325 MG per tablet Take 1 tablet by mouth every 4 (four) hours as needed for severe pain. 08/20/14   Daleen Bo, MD  valACYclovir (VALTREX) 1000 MG tablet Take 0.5 tablets (500 mg total) by mouth daily. 08/20/14 09/03/14  Daleen Bo, MD   BP 130/97 mmHg  Pulse 79  Temp(Src) 97.6 F (36.4 C) (Oral)  Resp 24  Ht 5\' 6"  (1.676 m)  Wt 140 lb (63.504 kg)  BMI 22.61 kg/m2  SpO2 100%  LMP 08/11/2014 Physical Exam  Constitutional: She is oriented to person, place, and time. She appears well-developed and well-nourished.  HENT:  Head: Normocephalic and atraumatic.  Right Ear: External ear normal.  Left Ear: External ear normal.  Eyes: Conjunctivae and EOM are normal. Pupils are equal, round, and reactive to light.  Neck: Normal range of motion and phonation normal. Neck supple.  Cardiovascular: Normal rate, regular rhythm and normal heart sounds.   Pulmonary/Chest: Effort normal and breath sounds normal. She exhibits no bony tenderness.  Abdominal: Soft. There is no tenderness.  Musculoskeletal: Normal range of motion.  Neurological: She is alert and oriented to person, place, and time. No cranial nerve deficit or sensory deficit. She exhibits normal muscle tone. Coordination normal.  Skin: Skin is warm, dry and intact.  Vesicular rash left L45 dermatome extending to the left lower leg laterally and anteriorly  Psychiatric: She has a normal mood and affect. Her behavior is normal. Judgment and thought content normal.  Nursing note and vitals reviewed.   ED Course  Procedures (including critical care time)  Medications  diazepam (VALIUM) injection 10 mg (10 mg Intravenous Not Given 08/20/14 2313)  ketorolac (TORADOL) injection 60 mg (60 mg Intramuscular Given 08/20/14 2249)  valACYclovir (VALTREX) tablet  500 mg (500 mg Oral Given 08/20/14 2248)  diazepam (VALIUM) injection 10 mg (10 mg Intramuscular Given 08/20/14 2248)  oxyCODONE-acetaminophen (PERCOCET/ROXICET) 5-325 MG per tablet 1 tablet (1 tablet Oral Given 08/20/14 2337)    Patient Vitals for the past 24 hrs:  BP Temp Temp src Pulse Resp SpO2 Height Weight  08/20/14 2127 130/97 mmHg 97.6 F (36.4 C) Oral 79 24 100 % 5\' 6"  (1.676 m) 140 lb (63.504 kg)    At discharge- Reevaluation with update and discussion. After initial assessment and treatment, an updated evaluation reveals she is feeling some better. Findings discussed with patient, all questions answered. [place] Review Labs Reviewed - No data to display  Imaging Review Ct Renal Stone Study  08/19/2014   CLINICAL DATA:  Left-sided flank and  abdominal pain since yesterday.  EXAM: CT ABDOMEN AND PELVIS WITHOUT CONTRAST  TECHNIQUE: Multidetector CT imaging of the abdomen and pelvis was performed following the standard protocol without IV contrast.  COMPARISON:  02/07/2014  FINDINGS: Lower chest:  Unremarkable.  Hepatobiliary: No mass visualized on this unenhanced exam. Prior cholecystectomy again noted.  Pancreas: No mass or inflammatory process visualized on this unenhanced exam.  Spleen: Within normal limits in size. Numerous tiny calcified splenic granulomata again seen.  Adrenal Glands:  No masses identified.  Kidneys/Urinary tract: No evidence of urolithiasis or hydronephrosis. Diffuse bilateral renal atrophy again seen, consistent with end-stage renal disease.  Stomach/Bowel/Peritoneum:  Unremarkable.  Vascular/Lymphatic: No pathologically enlarged lymph nodes identified. No other significant abnormality identified.  Reproductive:  No mass or other significant abnormality noted.  Other:  None.  Musculoskeletal:  No suspicious bone lesions identified.  IMPRESSION: No evidence of urolithiasis, hydronephrosis, or other acute findings.  Stable diffuse bilateral renal atrophy,  consistent with end-stage renal disease. No evidence of hydronephrosis.   Electronically Signed   By: Earle Gell M.D.   On: 08/19/2014 18:21     EKG Interpretation None      MDM   Final diagnoses:  Shingles    Shingles lumbar dermatome, and patient with end-stage renal disease. Have started Valtrex, and changed pain medication to stronger type.  Nursing Notes Reviewed/ Care Coordinated Applicable Imaging Reviewed Interpretation of Laboratory Data incorporated into ED treatment  The patient appears reasonably screened and/or stabilized for discharge and I doubt any other medical condition or other [facility] requiring further screening, evaluation, or treatment in the ED at this time prior to discharge.  Plan: Home Medications- Valtrex, dose adjusted, Percocet; Home Treatments- rest; return here if the recommended treatment, does not improve the symptoms; Recommended follow up- PCP prn, continue dialysis  Daleen Bo, MD 08/20/14 2344

## 2014-08-20 NOTE — ED Notes (Signed)
Patient was seen yesterday for left side pain, and states pain is now in her leg.

## 2015-01-10 ENCOUNTER — Encounter (HOSPITAL_COMMUNITY): Payer: Self-pay | Admitting: Emergency Medicine

## 2015-01-10 ENCOUNTER — Emergency Department (HOSPITAL_COMMUNITY): Payer: Medicaid Other

## 2015-01-10 ENCOUNTER — Observation Stay (HOSPITAL_COMMUNITY): Payer: Medicaid Other

## 2015-01-10 ENCOUNTER — Inpatient Hospital Stay (HOSPITAL_COMMUNITY)
Admission: EM | Admit: 2015-01-10 | Discharge: 2015-01-13 | DRG: 312 | Disposition: A | Discharge status: Home / Self Care | Payer: Medicaid Other | Attending: Internal Medicine | Admitting: Internal Medicine

## 2015-01-10 DIAGNOSIS — I6350 Cerebral infarction due to unspecified occlusion or stenosis of unspecified cerebral artery: Secondary | ICD-10-CM

## 2015-01-10 DIAGNOSIS — N179 Acute kidney failure, unspecified: Secondary | ICD-10-CM | POA: Diagnosis present

## 2015-01-10 DIAGNOSIS — R42 Dizziness and giddiness: Secondary | ICD-10-CM

## 2015-01-10 DIAGNOSIS — N19 Unspecified kidney failure: Secondary | ICD-10-CM

## 2015-01-10 DIAGNOSIS — Z833 Family history of diabetes mellitus: Secondary | ICD-10-CM

## 2015-01-10 DIAGNOSIS — M329 Systemic lupus erythematosus, unspecified: Secondary | ICD-10-CM | POA: Diagnosis present

## 2015-01-10 DIAGNOSIS — D631 Anemia in chronic kidney disease: Secondary | ICD-10-CM | POA: Diagnosis present

## 2015-01-10 DIAGNOSIS — N186 End stage renal disease: Secondary | ICD-10-CM | POA: Diagnosis present

## 2015-01-10 DIAGNOSIS — I10 Essential (primary) hypertension: Secondary | ICD-10-CM | POA: Diagnosis present

## 2015-01-10 DIAGNOSIS — R1311 Dysphagia, oral phase: Secondary | ICD-10-CM | POA: Diagnosis present

## 2015-01-10 DIAGNOSIS — I120 Hypertensive chronic kidney disease with stage 5 chronic kidney disease or end stage renal disease: Secondary | ICD-10-CM | POA: Diagnosis present

## 2015-01-10 DIAGNOSIS — G4489 Other headache syndrome: Secondary | ICD-10-CM

## 2015-01-10 DIAGNOSIS — I951 Orthostatic hypotension: Principal | ICD-10-CM | POA: Diagnosis present

## 2015-01-10 DIAGNOSIS — Z992 Dependence on renal dialysis: Secondary | ICD-10-CM

## 2015-01-10 DIAGNOSIS — G40909 Epilepsy, unspecified, not intractable, without status epilepticus: Secondary | ICD-10-CM

## 2015-01-10 DIAGNOSIS — E875 Hyperkalemia: Secondary | ICD-10-CM | POA: Diagnosis present

## 2015-01-10 LAB — COMPREHENSIVE METABOLIC PANEL
ALBUMIN: 4.1 g/dL (ref 3.5–5.0)
ALK PHOS: 87 U/L (ref 38–126)
ALT: 11 U/L — AB (ref 14–54)
ANION GAP: 12 (ref 5–15)
AST: 19 U/L (ref 15–41)
BILIRUBIN TOTAL: 0.7 mg/dL (ref 0.3–1.2)
BUN: 37 mg/dL — AB (ref 6–20)
CALCIUM: 9.2 mg/dL (ref 8.9–10.3)
CO2: 28 mmol/L (ref 22–32)
Chloride: 95 mmol/L — ABNORMAL LOW (ref 101–111)
Creatinine, Ser: 7.1 mg/dL — ABNORMAL HIGH (ref 0.44–1.00)
GFR calc Af Amer: 8 mL/min — ABNORMAL LOW (ref >60)
GFR calc non Af Amer: 7 mL/min — ABNORMAL LOW (ref >60)
GLUCOSE: 89 mg/dL (ref 65–99)
Potassium: 5.4 mmol/L — ABNORMAL HIGH (ref 3.5–5.1)
SODIUM: 135 mmol/L (ref 135–145)
Total Protein: 7.6 g/dL (ref 6.5–8.1)

## 2015-01-10 LAB — CBC
HCT: 35 % — ABNORMAL LOW (ref 36.0–46.0)
HEMOGLOBIN: 11.3 g/dL — AB (ref 12.0–15.0)
MCH: 30.5 pg (ref 26.0–34.0)
MCHC: 32.3 g/dL (ref 30.0–36.0)
MCV: 94.6 fL (ref 78.0–100.0)
PLATELETS: 174 10*3/uL (ref 150–400)
RBC: 3.7 MIL/uL — AB (ref 3.87–5.11)
RDW: 15.2 % (ref 11.5–15.5)
WBC: 5.8 10*3/uL (ref 4.0–10.5)

## 2015-01-10 LAB — PROTIME-INR
INR: 1.26 (ref 0.00–1.49)
PROTHROMBIN TIME: 16 s — AB (ref 11.6–15.2)

## 2015-01-10 LAB — I-STAT BETA HCG BLOOD, ED (MC, WL, AP ONLY): I-stat hCG, quantitative: <5 m[IU]/mL (ref <5)

## 2015-01-10 LAB — DIFFERENTIAL
BASOS ABS: 0 10*3/uL (ref 0.0–0.1)
Basophils Relative: 0 % (ref 0–1)
EOS PCT: 2 % (ref 0–5)
Eosinophils Absolute: 0.1 10*3/uL (ref 0.0–0.7)
LYMPHS ABS: 1.2 10*3/uL (ref 0.7–4.0)
LYMPHS PCT: 21 % (ref 12–46)
Monocytes Absolute: 0.5 10*3/uL (ref 0.1–1.0)
Monocytes Relative: 9 % (ref 3–12)
NEUTROS ABS: 3.9 10*3/uL (ref 1.7–7.7)
NEUTROS PCT: 68 % (ref 43–77)

## 2015-01-10 LAB — APTT: APTT: 32 s (ref 24–37)

## 2015-01-10 LAB — TROPONIN I: Troponin I: <0.03 ng/mL (ref <0.031)

## 2015-01-10 LAB — ETHANOL: Alcohol, Ethyl (B): <5 mg/dL (ref <5)

## 2015-01-10 LAB — TSH: TSH: 2.315 u[IU]/mL (ref 0.350–4.500)

## 2015-01-10 MED ORDER — ENOXAPARIN SODIUM 30 MG/0.3ML ~~LOC~~ SOLN
30.0000 mg | SUBCUTANEOUS | Status: DC
  Administered 2015-01-10 – 2015-01-12 (×2): 30 mg via SUBCUTANEOUS
  Filled 2015-01-10 (×2): qty 0.3

## 2015-01-10 MED ORDER — MORPHINE SULFATE (PF) 4 MG/ML IV SOLN: 4.0000 mg | Freq: Once | INTRAVENOUS | Status: AC

## 2015-01-10 MED ORDER — SENNOSIDES-DOCUSATE SODIUM 8.6-50 MG PO TABS: 1.0000 | ORAL_TABLET | Freq: Every evening | ORAL | Status: DC | PRN

## 2015-01-10 MED ORDER — CALCIUM ACETATE (PHOS BINDER) 667 MG PO CAPS
2001.0000 mg | ORAL_CAPSULE | Freq: Three times a day (TID) | ORAL | Status: DC
  Administered 2015-01-11 – 2015-01-13 (×7): 2001 mg via ORAL
  Filled 2015-01-10 (×8): qty 3

## 2015-01-10 MED ORDER — MORPHINE SULFATE (PF) 2 MG/ML IV SOLN
INTRAVENOUS | Status: AC
  Administered 2015-01-10: 4 mg via INTRAVENOUS
  Filled 2015-01-10: qty 2

## 2015-01-10 MED ORDER — AZATHIOPRINE 50 MG PO TABS
25.0000 mg | ORAL_TABLET | Freq: Every day | ORAL | Status: DC
  Administered 2015-01-10 – 2015-01-13 (×4): 25 mg via ORAL
  Filled 2015-01-10 (×6): qty 1

## 2015-01-10 MED ORDER — MECLIZINE HCL 12.5 MG PO TABS
25.0000 mg | ORAL_TABLET | Freq: Three times a day (TID) | ORAL | Status: DC
  Administered 2015-01-10 – 2015-01-13 (×9): 25 mg via ORAL
  Filled 2015-01-10 (×9): qty 2

## 2015-01-10 MED ORDER — HYDROXYCHLOROQUINE SULFATE 200 MG PO TABS
200.0000 mg | ORAL_TABLET | Freq: Every day | ORAL | Status: DC
  Administered 2015-01-10 – 2015-01-13 (×4): 200 mg via ORAL
  Filled 2015-01-10 (×6): qty 1

## 2015-01-10 MED ORDER — ASPIRIN 325 MG PO TABS
325.0000 mg | ORAL_TABLET | Freq: Every day | ORAL | Status: DC
  Administered 2015-01-10 – 2015-01-13 (×4): 325 mg via ORAL
  Filled 2015-01-10 (×4): qty 1

## 2015-01-10 MED ORDER — SODIUM POLYSTYRENE SULFONATE 15 GM/60ML PO SUSP
30.0000 g | Freq: Once | ORAL | Status: AC
  Administered 2015-01-10: 30 g via ORAL
  Filled 2015-01-10: qty 120

## 2015-01-10 MED ORDER — ONDANSETRON HCL 4 MG/2ML IJ SOLN: 4.0000 mg | Freq: Four times a day (QID) | INTRAMUSCULAR | Status: DC | PRN

## 2015-01-10 MED ORDER — ONDANSETRON HCL 4 MG/2ML IJ SOLN
4.0000 mg | Freq: Once | INTRAMUSCULAR | Status: AC
  Administered 2015-01-10: 4 mg via INTRAVENOUS
  Filled 2015-01-10: qty 2

## 2015-01-10 MED ORDER — STROKE: EARLY STAGES OF RECOVERY BOOK
Freq: Once | Status: AC
  Administered 2015-01-10: 13:00:00
  Filled 2015-01-10: qty 1

## 2015-01-10 MED ORDER — SODIUM CHLORIDE 0.9 % IV SOLN
1.0000 g | Freq: Once | INTRAVENOUS | Status: AC
  Administered 2015-01-10: 1 g via INTRAVENOUS
  Filled 2015-01-10: qty 10

## 2015-01-10 MED ORDER — ASPIRIN 300 MG RE SUPP
300.0000 mg | Freq: Every day | RECTAL | Status: DC
  Filled 2015-01-10 (×6): qty 1

## 2015-01-10 MED ORDER — ACETAMINOPHEN 325 MG PO TABS
650.0000 mg | ORAL_TABLET | ORAL | Status: DC | PRN
  Administered 2015-01-10 – 2015-01-11 (×3): 650 mg via ORAL
  Filled 2015-01-10 (×4): qty 2

## 2015-01-10 MED ORDER — ACETAMINOPHEN 650 MG RE SUPP: 650.0000 mg | RECTAL | Status: DC | PRN

## 2015-01-10 MED ORDER — CALCIUM ACETATE (PHOS BINDER) 667 MG PO CAPS: 667.0000 mg | ORAL_CAPSULE | Freq: Three times a day (TID) | ORAL | Status: DC

## 2015-01-10 MED ORDER — LEVETIRACETAM 250 MG PO TABS
250.0000 mg | ORAL_TABLET | ORAL | Status: DC
  Administered 2015-01-11: 250 mg via ORAL
  Filled 2015-01-10 (×2): qty 1

## 2015-01-10 MED ORDER — VITAMIN D 1000 UNITS PO TABS
1000.0000 [IU] | ORAL_TABLET | Freq: Every day | ORAL | Status: DC
  Administered 2015-01-10 – 2015-01-13 (×4): 1000 [IU] via ORAL
  Filled 2015-01-10 (×4): qty 1

## 2015-01-10 MED ORDER — CALCIUM CARBONATE ANTACID 500 MG PO CHEW
1.0000 | CHEWABLE_TABLET | Freq: Every day | ORAL | Status: DC
  Administered 2015-01-10 – 2015-01-13 (×4): 200 mg via ORAL
  Filled 2015-01-10 (×4): qty 1

## 2015-01-10 MED ORDER — LEVETIRACETAM 500 MG PO TABS
500.0000 mg | ORAL_TABLET | Freq: Two times a day (BID) | ORAL | Status: DC
  Administered 2015-01-10 – 2015-01-13 (×7): 500 mg via ORAL
  Filled 2015-01-10 (×7): qty 1

## 2015-01-10 NOTE — ED Provider Notes (Signed)
CSN: [id number]     Arrival date & time 01/10/15  0421 History   First MD Initiated Contact with Patient 01/10/15 0431     Chief Complaint  Patient presents with  . Dizziness     Patient is a 39 y.o. female presenting with dizziness. The history is provided by the patient and a relative. Language interpreter used: son interpreted as phone interpreter unavailable and required urgent history.  Dizziness Quality:  Room spinning Severity:  Moderate Onset quality:  Gradual Duration:  1 day Timing:  Constant Progression:  Worsening Chronicity:  New Relieved by:  Nothing Worsened by:  Movement Associated symptoms: headaches, nausea and vomiting   Associated symptoms: no chest pain and no syncope   pt presents for dizziness for one day She reports HA Family reports she has been confused No focal weakness No falls She is on dialysis, last session was one day ago    Past Medical History  Diagnosis Date  . Gallstone pancreatitis Feb 2011  . Cervix carcinoma in situ Mar 2011  . Proteinuria - cause not known   . Hypertension   . Lupus   . CHF (congestive heart failure)   . Anemia   . Thrombocytopenia   . Blood transfusion   . Dialysis patient     tues, thursday, saturday.  . Renal failure, acute on chronic   . Chronic kidney disease     T, Th, Sat  . Lupus nephritis   . Renal insufficiency   . Seizure disorder 03/19/2013  . Pneumonia due to organism 03/02/2011   Past Surgical History  Procedure Laterality Date  . Appendectomy    . Cervical cone biopsy    . Cesarean section    . Cholecystectomy    . Embolectomy  10/10/2011    Procedure: EMBOLECTOMY;  Surgeon: Angelia Mould, MD;  Location: [place];  Service: Vascular;  Laterality: Right;  . Av fistula placement    . Av fistula placement  07/18/2011    Procedure: ARTERIOVENOUS (AV) FISTULA CREATION;  Surgeon: Angelia Mould, MD;  Location: [place];  Service: Vascular;  Laterality: Right;  . Av fistula placement   01/23/12    Revision of Right AVF  . Shuntogram Right 07/07/2011    Procedure: SHUNTOGRAM;  Surgeon: Angelia Mould, MD;  Location: [facility] CATH LAB;  Service: Cardiovascular;  Laterality: Right;  . Shuntogram N/A 12/08/2011    Procedure: Earney Mallet;  Surgeon: Angelia Mould, MD;  Location[facility] C[facility] LAB;  Service: Cardiovascular;  Laterality: N/A;  . Fistulogram Right 04/12/2012    Procedure: FISTULOGRAM;  Surgeon: Angelia Mould, MD;  Location: [facility] CATH LAB;  Service: Cardiovascular;  Laterality: Right;   Family History  Problem Relation Age of Onset  . Diabetes Mother   . Diabetes Brother   . Diabetes Brother   . Diabetes Maternal Grandmother   . Anesthesia problems Neg Hx    Social History  Substance Use Topics  . Smoking status: Never Smoker   . Smokeless tobacco: Never Used  . Alcohol Use: No   OB History    Gravida Para Term Preterm AB TAB SAB Ectopic Multiple Living   4 3   1  1   3      Review of Systems  Constitutional: Negative for fever.  Cardiovascular: Negative for chest pain and syncope.  Gastrointestinal: Positive for nausea and vomiting.  Neurological: Positive for dizziness and headaches. Negative for seizures.  All other systems reviewed and are negative.  Allergies  Review of patient's allergies indicates no known allergies.  Home Medications   Prior to Admission medications   Medication Sig Start Date End Date Taking? Authorizing Provider  azaTHIOprine (IMURAN) 50 MG tablet Take 25 mg by mouth daily.    Historical Provider, MD  calcium acetate (PHOSLO) 667 MG capsule Take 667-1,334 mg by mouth 3 (three) times daily with meals. Patient takes 3 tablets with meals and 1 tablet with snack    Historical Provider, MD  calcium carbonate (TUMS EX) 750 MG chewable tablet Chew 1 tablet by mouth daily.     Historical Provider, MD  cholecalciferol (VITAMIN D) 1000 UNITS tablet Take 1,000 Units by mouth daily.    Historical Provider, MD   HYDROcodone-acetaminophen (NORCO) 5-325 MG per tablet Take 1 tablet by mouth every 4 (four) hours as needed. 08/19/14   Daleen Bo, MD  hydroxychloroquine (PLAQUENIL) 200 MG tablet Take 200 mg by mouth daily.     Historical Provider, MD  levETIRAcetam (KEPPRA) 250 MG tablet Take 250 mg by mouth every Tuesday, Thursday, and Saturday at 6 PM. ON DIALYSIS DAYS    Historical Provider, MD  levETIRAcetam (KEPPRA) 500 MG tablet Take 500 mg by mouth 2 (two) times daily.    Historical Provider, MD  lidocaine-prilocaine (EMLA) cream Apply 1 application topically as needed (before dialysis).    Historical Provider, MD  oxyCODONE-acetaminophen (PERCOCET) 5-325 MG per tablet Take 1 tablet by mouth every 4 (four) hours as needed for severe pain. 08/20/14   Daleen Bo, MD   BP 118/63 mmHg  Pulse 69  Temp(Src) 98.5 F (36.9 C) (Oral)  Resp 20  Ht 5\' 5"  (1.651 m)  Wt 150 lb (68.04 kg)  BMI 24.96 kg/m2  SpO2 100% Physical Exam CONSTITUTIONAL: Well developed/well nourished, uncomfortable appearing HEAD: Normocephalic/atraumatic, mild tenderness to occipital region, no bruising/crepitus EYES: EOMI/PERRL, no nystagmus ENMT: Mucous membranes moist NECK: supple no meningeal signs SPINE/BACK:entire spine nontender CV: S1/S2 noted, no murmurs/rubs/gallops noted LUNGS: Lungs are clear to auscultation bilaterally, no apparent distress ABDOMEN: soft, nontender, no rebound or guarding, bowel sounds noted throughout abdomen GU:no cva tenderness NEURO: Pt is awake/alert/appropriate, moves all extremitiesx4.  No facial droop.  No arm/leg drift is noted EXTREMITIES: pulses normal/equal, full ROM, dialysis access to right UE, thrill noted SKIN: warm, color normal PSYCH: no abnormalities of mood noted, alert and oriented to situation  ED Course  Procedures  5:11 AM Pt with h/o ESRD here with vertigo/HA for one day CT imaging/labs ordered tPA in stroke considered but not given due to: Onset over  3-4.5hours  7:08 AM Pt still reports significant dizziness Upon ambulation she had difficulty walking and had to be assisted back to bed Her O2 sat dropped but it is normal in the bed and she is not in resp distress and denied CP Given h/o continued dizziness/HA and h/o ESRD disease, possibility of occult stroke Will admit for monitoring and possible MRI 7:46 AM D/w dr Roderic Palau Will admit Mild hyperK but no EKG changes  Labs Review Labs Reviewed  PROTIME-INR - Abnormal; Notable for the following:    Prothrombin Time 16.0 (*)    All other components within normal limits  CBC - Abnormal; Notable for the following:    RBC 3.70 (*)    Hemoglobin 11.3 (*)    HCT 35.0 (*)    All other components within normal limits  COMPREHENSIVE METABOLIC PANEL - Abnormal; Notable for the following:    Potassium 5.4 (*)    Chloride  95 (*)    BUN 37 (*)    Creatinine, Ser 7.10 (*)    ALT 11 (*)    GFR calc non Af Amer 7 (*)    GFR calc Af Amer 8 (*)    All other components within normal limits  ETHANOL  APTT  DIFFERENTIAL  TROPONIN I  I-STAT BETA HCG BLOOD, ED ([facility], [facility], [facility] ONLY)    Imaging Review Ct Head Wo Contrast  01/10/2015   CLINICAL DATA:  Dizziness, headache and nausea beginning yesterday. Weakness. History of lupus, cervical cancer, seizure disorder, on dialysis.  EXAM: CT HEAD WITHOUT CONTRAST  TECHNIQUE: Contiguous axial images were obtained from the base of the skull through the vertex without intravenous contrast.  COMPARISON:  CT head February 07, 2014  FINDINGS: The ventricles and sulci are normal. No intraparenchymal hemorrhage, mass effect nor midline shift. No acute large vascular territory infarcts. Cerebellar tonsils at but not below the foramen magnum.  No abnormal extra-axial fluid collections. Basal cisterns are patent. Minimal calcific atherosclerosis the carotid siphons.  No skull fracture. The included ocular globes and orbital contents are non-suspicious. Mild ethmoid  mucosal thickening without paranasal sinus air-fluid levels. The mastoid air cells are well aerated.  IMPRESSION: No acute intracranial process; normal noncontrast CT head.   Electronically Signed   By: Elon Alas M.D.   On: 01/10/2015 05:29   Dg Chest Portable 1 View  01/10/2015   CLINICAL DATA:  Weakness this morning.  EXAM: PORTABLE CHEST - 1 VIEW  COMPARISON:  01/05/2014  FINDINGS: The cardiomediastinal contours are normal. The lungs are clear. Pulmonary vasculature is normal. No consolidation, pleural effusion, or pneumothorax. No acute osseous abnormalities are seen. Multiple overlying monitoring devices in place.  IMPRESSION: No acute pulmonary process.   Electronically Signed   By: Jeb Levering M.D.   On: 01/10/2015 06:45   I have personally reviewed and evaluated these images and lab results as part of my medical decision-making.   EKG Interpretation   Date/Time:  Wednesday January 10 2015 04:34:28 EDT Ventricular Rate:  65 PR Interval:  140 QRS Duration: 79 QT Interval:  423 QTC Calculation: 440 R Axis:   74 Text Interpretation:  Sinus rhythm Abnormal R-wave progression, early  transition No significant change since last tracing Confirmed by Christy Gentles   MD, Elenore Rota ([id number]) on 01/10/2015 4:42:30 AM      MDM   Final diagnoses:  Vertigo  Renal failure  Other headache syndrome    Nursing notes including past medical history and social history reviewed and considered in documentation Labs/vital reviewed myself and considered during evaluation Previous records reviewed and considered     Ripley Fraise, MD 01/10/15 [phone]

## 2015-01-10 NOTE — H&P (Signed)
[facility] History and Physical  Maria Vazquez [id number] DOB: 05/28/1975 DOA: 01/10/2015  Referring physician: Ripley Fraise, MD PCP: Estanislado Emms, MD   Chief Complaint: Dizziness  HPI: Maria Vazquez is a 39 y.o. female who presented with complaints of headache and dizziness onset 01/09/15. The dizziness is gradually worsening and increases when standing, no reports of any alleviating factors. She reports blurry vision but also feels this maybe a chronic issue. She also reported nausea with vomiting x2 a day for the past week, mild abdominal pain, generalized weakness, and walking is made difficult due to dizziness. Next dialysis is scheduled 01/11/15. She denies double vision, chest pain, sob different from chronic, dysuria, rash, sores on skin, numbness, or difficulty eating. She is anuric and denies any fever, no cough or worsening SOB, no new medications.  She was evaluated in the ED where CT Head was found to be negative. There was concern for possible posterior circulation stroke, and she has been referred for admission.    Review of Systems:  Constitutional:  No weight loss, night sweats, Fevers, chills, fatigue.  HEENT:  No, Difficulty swallowing,Tooth/dental problems,Sore throat,  No sneezing, itching, ear ache, nasal congestion, post nasal drip,  Yes, headache Cardio-vascular:  No chest pain, Orthopnea, PND, swelling in lower extremities, anasarca, palpitations  Yes, dizziness GI:  diarrhea, change in bowel habits, loss of appetite  Yes, nausea, vomiting, abd pain Resp:  No shortness of breath with exertion or at rest. No excess mucus, no productive cough, No non-productive cough, No coughing up of blood.No change in color of mucus.No wheezing.No chest wall deformity  Skin:  no rash or lesions.  GU:  no dysuria, change in color of urine, no urgency or frequency. No flank pain.  Musculoskeletal:  No joint pain or swelling. No decreased  range of motion. No back pain.  Psych:  No change in mood or affect. No depression or anxiety. No memory loss.   Past Medical History  Diagnosis Date  . Gallstone pancreatitis Feb 2011  . Cervix carcinoma in situ Mar 2011  . Proteinuria - cause not known   . Hypertension   . Lupus   . CHF (congestive heart failure)   . Anemia   . Thrombocytopenia   . Blood transfusion   . Dialysis patient     tues, thursday, saturday.  . Renal failure, acute on chronic   . Chronic kidney disease     T, Th, Sat  . Lupus nephritis   . Renal insufficiency   . Seizure disorder 03/19/2013  . Pneumonia due to organism 03/02/2011   Past Surgical History  Procedure Laterality Date  . Appendectomy    . Cervical cone biopsy    . Cesarean section    . Cholecystectomy    . Embolectomy  10/10/2011    Procedure: EMBOLECTOMY;  Surgeon: Angelia Mould, MD;  Location: [place];  Service: Vascular;  Laterality: Right;  . Av fistula placement    . Av fistula placement  07/18/2011    Procedure: ARTERIOVENOUS (AV) FISTULA CREATION;  Surgeon: Angelia Mould, MD;  Location: [place];  Service: Vascular;  Laterality: Right;  . Av fistula placement  01/23/12    Revision of Right AVF  . Shuntogram Right 07/07/2011    Procedure: SHUNTOGRAM;  Surgeon: Angelia Mould, MD;  Location: [facility] CATH LAB;  Service: Cardiovascular;  Laterality: Right;  . Shuntogram N/A 12/08/2011    Procedure: Earney Mallet;  Surgeon: Angelia Mould, MD;  Location: [facility] CATH LAB;  Service: Cardiovascular;  Laterality: N/A;  . Fistulogram Right 04/12/2012    Procedure: FISTULOGRAM;  Surgeon: Angelia Mould, MD;  Location: [facility] CATH LAB;  Service: Cardiovascular;  Laterality: Right;   Social History:  reports that she has never smoked. She has never used smokeless tobacco. She reports that she does not drink alcohol or use illicit drugs.  No Known Allergies  Family History  Problem Relation Age of Onset  . Diabetes Mother   .  Diabetes Brother   . Diabetes Brother   . Diabetes Maternal Grandmother   . Anesthesia problems Neg Hx     Prior to Admission medications   Medication Sig Start Date End Date Taking? Authorizing Provider  azaTHIOprine (IMURAN) 50 MG tablet Take 25 mg by mouth daily.    Historical Provider, MD  calcium acetate (PHOSLO) 667 MG capsule Take 667-1,334 mg by mouth 3 (three) times daily with meals. Patient takes 3 tablets with meals and 1 tablet with snack    Historical Provider, MD  calcium carbonate (TUMS EX) 750 MG chewable tablet Chew 1 tablet by mouth daily.     Historical Provider, MD  cholecalciferol (VITAMIN D) 1000 UNITS tablet Take 1,000 Units by mouth daily.    Historical Provider, MD  HYDROcodone-acetaminophen (NORCO) 5-325 MG per tablet Take 1 tablet by mouth every 4 (four) hours as needed. 08/19/14   Daleen Bo, MD  hydroxychloroquine (PLAQUENIL) 200 MG tablet Take 200 mg by mouth daily.     Historical Provider, MD  levETIRAcetam (KEPPRA) 250 MG tablet Take 250 mg by mouth every Tuesday, Thursday, and Saturday at 6 PM. ON DIALYSIS DAYS    Historical Provider, MD  levETIRAcetam (KEPPRA) 500 MG tablet Take 500 mg by mouth 2 (two) times daily.    Historical Provider, MD  lidocaine-prilocaine (EMLA) cream Apply 1 application topically as needed (before dialysis).    Historical Provider, MD  oxyCODONE-acetaminophen (PERCOCET) 5-325 MG per tablet Take 1 tablet by mouth every 4 (four) hours as needed for severe pain. 08/20/14   Daleen Bo, MD   Physical Exam: Filed Vitals:   01/10/15 0700 01/10/15 0730 01/10/15 0800 01/10/15 0838  BP: 120/89 118/90 128/102 132/91  Pulse: 62 63 66 64  Temp:    98.4 F (36.9 C)  TempSrc:    Oral  Resp: 16 15 15 20   Height:    5\' 7"  (1.702 m)  Weight:    77.157 kg (170 lb 1.6 oz)  SpO2: 99% 97% 100% 100%    Wt Readings from Last 3 Encounters:  01/10/15 77.157 kg (170 lb 1.6 oz)  08/20/14 63.504 kg (140 lb)  08/19/14 63.504 kg (140 lb)   General:   Appears calm and comfortable Eyes: PERRL, normal lids, irises & conjunctiva ENT: grossly normal hearing, lips & tongue Neck: no LAD, masses or thyromegaly Cardiovascular: RRR, no m/r/g. No LE edema. Telemetry: SR, no arrhythmias  Respiratory: CTA bilaterally, no w/r/r. Normal respiratory effort. Abdomen: soft, ntnd Skin: no rash or induration seen on limited exam Musculoskeletal:  AV fistual in RUE. Psychiatric: grossly normal mood and affect, speech fluent and appropriate Neurologic: grossly non-focal, no pronator drift, strength is equal bilaterally, CN grossly intact          Labs on Admission:  Basic Metabolic Panel:  Recent Labs Lab 01/10/15 0440  NA 135  K 5.4*  CL 95*  CO2 28  GLUCOSE 89  BUN 37*  CREATININE 7.10*  CALCIUM 9.2   Liver Function Tests:  Recent Labs  Lab 01/10/15 0440  AST 19  ALT 11*  ALKPHOS 87  BILITOT 0.7  PROT 7.6  ALBUMIN 4.1  CBC:  Recent Labs Lab 01/10/15 0440  WBC 5.8  NEUTROABS 3.9  HGB 11.3*  HCT 35.0*  MCV 94.6  PLT 174   Cardiac Enzymes:  Recent Labs Lab 01/10/15 0440  TROPONINI <0.03   Radiological Exams on Admission: Ct Head Wo Contrast  01/10/2015   CLINICAL DATA:  Dizziness, headache and nausea beginning yesterday. Weakness. History of lupus, cervical cancer, seizure disorder, on dialysis.  EXAM: CT HEAD WITHOUT CONTRAST  TECHNIQUE: Contiguous axial images were obtained from the base of the skull through the vertex without intravenous contrast.  COMPARISON:  CT head February 07, 2014  FINDINGS: The ventricles and sulci are normal. No intraparenchymal hemorrhage, mass effect nor midline shift. No acute large vascular territory infarcts. Cerebellar tonsils at but not below the foramen magnum.  No abnormal extra-axial fluid collections. Basal cisterns are patent. Minimal calcific atherosclerosis the carotid siphons.  No skull fracture. The included ocular globes and orbital contents are non-suspicious. Mild ethmoid  mucosal thickening without paranasal sinus air-fluid levels. The mastoid air cells are well aerated.  IMPRESSION: No acute intracranial process; normal noncontrast CT head.   Electronically Signed   By: Elon Alas M.D.   On: 01/10/2015 05:29   Dg Chest Portable 1 View  01/10/2015   CLINICAL DATA:  Weakness this morning.  EXAM: PORTABLE CHEST - 1 VIEW  COMPARISON:  01/05/2014  FINDINGS: The cardiomediastinal contours are normal. The lungs are clear. Pulmonary vasculature is normal. No consolidation, pleural effusion, or pneumothorax. No acute osseous abnormalities are seen. Multiple overlying monitoring devices in place.  IMPRESSION: No acute pulmonary process.   Electronically Signed   By: Jeb Levering M.D.   On: 01/10/2015 06:45    EKG: Independently reviewed. SR, Peaked T waves  Assessment/Plan Active Problems:   Vertigo  1. Dizziness. CT Head was unremarkable. She does have high risk factors for underling vascular disease, concern for possible PCA infarct, will order stroke workup with MRI Brain, Carotid doppler, ECHO. Started on ASA, monitor on telemetry, further orders per stroke order set. Orthostatics were checked in the ED and patient had mild increase in heart rate on standing. Blood pressure remained stable. Will continue to follow. 2. Hyperkalemia. Will give 1 dose of calcium acetate since she has peaked t waves. One dose of Kayexalate. 3. ESRD. Next dialysis is due tomorrow. Will consult nephrology.  4. Normocytic anemia.Hgb is stable and there is no evidence of bleeding. Will continue to follow.  5. Lupus, continue  Plaquenil and Imuran.  Code Status: Full DVT Prophylaxis: SCD Family Communication: Family bedside. Discussed with patient who understands and has no concerns at this time. Disposition Plan: Admit to medical bed  Time spent: 50 minutes   Kathie Dike, MD [facility] Pager [phone]  I, Laban Emperor. Leonie Green, acting as scribe, recorded this note  contemporaneously in the presence of Dr. Kathie Dike, M.D. on 01/10/2015.   I have reviewed the above documentation for accuracy and completeness, and I agree with the above.  Ladarrious Kirksey

## 2015-01-10 NOTE — Progress Notes (Signed)
Patient reported headache relieved, per spouse patient having difficulty swallowing saliva, head of bed flat without pillow.  Raised head of bed, pillow under head, no cough or distress noted.

## 2015-01-10 NOTE — Consult Note (Signed)
Maria Vazquez MRN: [id number] DOB/AGE: 1975-07-10 39 y.o. Primary Care Physician:POWELL,ALVIN C, MD Admit date: 01/10/2015 Chief Complaint:  Chief Complaint  Patient presents with  . Dizziness   HPI:  Pt is 39 year old female withpast medical hx of ESRD whio came to Er with c/o dizziness.    HPI dates back to yesterday pt started having intractable headaches associated with nausea and vomiting. Pt c/o feeling dizzy, it started 1-2 weeks back, intermittent earlier,now constant. Pt  has been admitted with possible posterior stroke Pt offers no c/o denies double vision, chest pain NO c/o change in dyspnea. NO c/o dysuria,  NO c/o rash NO c/o  numbness  NO c/o difficulty eating.  NO c/o  any fever, no cough or worsening SOB, no new medications.  Past Medical History  Diagnosis Date  . Gallstone pancreatitis Feb 2011  . Cervix carcinoma in situ Mar 2011  . Proteinuria - cause not known   . Hypertension   . Lupus   . CHF (congestive heart failure)   . Anemia   . Thrombocytopenia   . Blood transfusion   . Dialysis patient     tues, thursday, saturday.  . Renal failure, acute on chronic   . Chronic kidney disease     T, Th, Sat  . Lupus nephritis   . Renal insufficiency   . Seizure disorder 03/19/2013  . Pneumonia due to organism 03/02/2011       Family History  Problem Relation Age of Onset  . Diabetes Mother   . Diabetes Brother   . Diabetes Brother   . Diabetes Maternal Grandmother   . Anesthesia problems Neg Hx    Social History:  reports that she has never smoked. She has never used smokeless tobacco. She reports that she does not drink alcohol or use illicit drugs.   Allergies: No Known Allergies  Medications Prior to Admission  Medication Sig Dispense Refill  . azaTHIOprine (IMURAN) 50 MG tablet Take 25 mg by mouth daily.    . calcium acetate (PHOSLO) 667 MG capsule Take 667-1,334 mg by mouth 3 (three) times daily with meals. Patient takes 3  tablets with meals and 1 tablet with snack    . calcium carbonate (TUMS EX) 750 MG chewable tablet Chew 1 tablet by mouth daily.     . cholecalciferol (VITAMIN D) 1000 UNITS tablet Take 1,000 Units by mouth daily.    . hydroxychloroquine (PLAQUENIL) 200 MG tablet Take 200 mg by mouth daily.     Marland Kitchen levETIRAcetam (KEPPRA) 250 MG tablet Take 250 mg by mouth every Tuesday, Thursday, and Saturday at 6 PM. ON DIALYSIS DAYS    . levETIRAcetam (KEPPRA) 500 MG tablet Take 500 mg by mouth 2 (two) times daily.    Marland Kitchen lidocaine-prilocaine (EMLA) cream Apply 1 application topically as needed (before dialysis).    . [DISCONTINUED] HYDROcodone-acetaminophen (NORCO) 5-325 MG per tablet Take 1 tablet by mouth every 4 (four) hours as needed. 10 tablet 0  . [DISCONTINUED] oxyCODONE-acetaminophen (PERCOCET) 5-325 MG per tablet Take 1 tablet by mouth every 4 (four) hours as needed for severe pain. 20 tablet 0       ROS:Pt offers no other complaints besides the HPI   . aspirin  300 mg Rectal Daily   Or  . aspirin  325 mg Oral Daily  . azaTHIOprine  25 mg Oral Daily  . calcium acetate  667-1,334 mg Oral TID WC  . calcium carbonate  1 tablet Oral Daily  . cholecalciferol  1,000 Units Oral Daily  . enoxaparin (LOVENOX) injection  30 mg Subcutaneous Q24H  . hydroxychloroquine  200 mg Oral Daily  . [START ON 01/11/2015] levETIRAcetam  250 mg Oral Q T,Th,Sat-1800  . levETIRAcetam  500 mg Oral BID  . meclizine  25 mg Oral TID       Physical Exam: Vital signs in last 24 hours: Temp:  [98 F (36.7 C)-98.5 F (36.9 C)] 98 F (36.7 C) (08/24 1330) Pulse Rate:  [60-68] 67 (08/24 1330) Resp:  [15-21] 18 (08/24 1330) BP: (111-132)/(63-102) 116/81 mmHg (08/24 1330) SpO2:  [97 %-100 %] 100 % (08/24 1330) Weight:  [150 lb (68.04 kg)-170 lb 1.6 oz (77.157 kg)] 170 lb 1.6 oz (77.157 kg) (08/24 [id number]) Weight change:  Last BM Date: 01/10/15  Intake/Output from previous day:       Physical Exam: General- pt is  awake,alert, following commands. Resp- No acute REsp distress, chest clear to auscultation CVS- S1S2 regular in rate and rhythm distant  GIT- BS+, soft, NT, ND EXT- NO LE Edema, NO Cyanosis Psych- normal mood and affect Access-  AVF+   Lab Results: CBC  Recent Labs  01/10/15 0440  WBC 5.8  HGB 11.3*  HCT 35.0*  PLT 174    BMET  Recent Labs  01/10/15 0440  NA 135  K 5.4*  CL 95*  CO2 28  GLUCOSE 89  BUN 37*  CREATININE 7.10*  CALCIUM 9.2    MICRO No results found for this or any previous visit (from the past 240 hour(s)).    Lab Results  Component Value Date   PTH 287.8* 03/06/2011   CALCIUM 9.2 01/10/2015   CAION 1.20 04/12/2012   PHOS 3.8 01/06/2014    Impression: 1)Renal  ESRD on HD               ON TTS schedule               Pt last Hd was this Tuesday  2)HTN BP stable   3)Anemia HGb at goal (9--11) No need of epo as hgb more than 11  4)CKD Mineral-Bone Disorder PTH acceptable. Secondary Hyperparathyroidism  Present. Phosphorus at goal.   5)CNS - admitted with possible CVA Primary team following  CT and MRI negative so far  6)Electrolytes Normokalemic NOrmonatremic   7)Acid base Co2 at goal  8) SLE     On azathiopurine       Plan:  Will continue current care. Agree with giveing kayexalate for today Will dialyze in am       BHUTANI,MANPREET S 01/10/2015, 2:42 PM

## 2015-01-10 NOTE — ED Notes (Signed)
Attempted to walk pt, pt assisted out of bed and walked to door, pt states that she is dizzy and her legs feel very weak; pt's O2 sats dropped to 82% and pt c/o headache; EDP made aware and new orders given and carried out

## 2015-01-10 NOTE — Progress Notes (Signed)
Swallowed medications with water, required effort to swallow, no cough with swallowing, talking with family and resting in bed.

## 2015-01-10 NOTE — ED Notes (Signed)
Pt c/o dizziness that started yesterday with nausea and headache.

## 2015-01-11 ENCOUNTER — Observation Stay (HOSPITAL_COMMUNITY): Payer: Medicaid Other

## 2015-01-11 DIAGNOSIS — M329 Systemic lupus erythematosus, unspecified: Secondary | ICD-10-CM

## 2015-01-11 DIAGNOSIS — I10 Essential (primary) hypertension: Secondary | ICD-10-CM

## 2015-01-11 LAB — BASIC METABOLIC PANEL
Anion gap: 16 — ABNORMAL HIGH (ref 5–15)
BUN: 52 mg/dL — AB (ref 6–20)
CHLORIDE: 95 mmol/L — AB (ref 101–111)
CO2: 25 mmol/L (ref 22–32)
CREATININE: 9.76 mg/dL — AB (ref 0.44–1.00)
Calcium: 8.2 mg/dL — ABNORMAL LOW (ref 8.9–10.3)
GFR calc Af Amer: 5 mL/min — ABNORMAL LOW (ref >60)
GFR calc non Af Amer: 5 mL/min — ABNORMAL LOW (ref >60)
GLUCOSE: 78 mg/dL (ref 65–99)
Potassium: 5.9 mmol/L — ABNORMAL HIGH (ref 3.5–5.1)
Sodium: 136 mmol/L (ref 135–145)

## 2015-01-11 LAB — LIPID PANEL
CHOL/HDL RATIO: 3.4 ratio
Cholesterol: 186 mg/dL (ref 0–200)
HDL: 54 mg/dL (ref >40)
LDL CALC: 107 mg/dL — AB (ref 0–99)
Triglycerides: 127 mg/dL (ref <150)
VLDL: 25 mg/dL (ref 0–40)

## 2015-01-11 LAB — CBC
HCT: 31.9 % — ABNORMAL LOW (ref 36.0–46.0)
Hemoglobin: 10.4 g/dL — ABNORMAL LOW (ref 12.0–15.0)
MCH: 30.7 pg (ref 26.0–34.0)
MCHC: 32.6 g/dL (ref 30.0–36.0)
MCV: 94.1 fL (ref 78.0–100.0)
Platelets: 144 K/uL — ABNORMAL LOW (ref 150–400)
RBC: 3.39 MIL/uL — ABNORMAL LOW (ref 3.87–5.11)
RDW: 15 % (ref 11.5–15.5)
WBC: 4.1 K/uL (ref 4.0–10.5)

## 2015-01-11 MED ORDER — LIDOCAINE HCL (PF) 1 % IJ SOLN: 5.0000 mL | INTRAMUSCULAR | Status: DC | PRN

## 2015-01-11 MED ORDER — NEPRO/CARBSTEADY PO LIQD: 237.0000 mL | ORAL | Status: DC | PRN

## 2015-01-11 MED ORDER — SODIUM CHLORIDE 0.9 % IV SOLN: 100.0000 mL | INTRAVENOUS | Status: DC | PRN

## 2015-01-11 MED ORDER — ALTEPLASE 2 MG IJ SOLR
2.0000 mg | Freq: Once | INTRAMUSCULAR | Status: DC | PRN
  Filled 2015-01-11: qty 2

## 2015-01-11 MED ORDER — LIDOCAINE-PRILOCAINE 2.5-2.5 % EX CREA: 1.0000 "application " | TOPICAL_CREAM | CUTANEOUS | Status: DC | PRN

## 2015-01-11 MED ORDER — HEPARIN SODIUM (PORCINE) 1000 UNIT/ML DIALYSIS
1000.0000 [IU] | INTRAMUSCULAR | Status: DC | PRN
  Filled 2015-01-11: qty 1

## 2015-01-11 MED ORDER — PENTAFLUOROPROP-TETRAFLUOROETH EX AERO: 1.0000 "application " | INHALATION_SPRAY | CUTANEOUS | Status: DC | PRN

## 2015-01-11 NOTE — Care Management Note (Signed)
Case Management Note  Patient Details  Name: Korina Macias MRN: [id number] Date of Birth: 26-Dec-1975  Subjective/Objective:                  Pt admitted from home with vertigo. Pt lives with her husband and son. Pt requires mod assistance with ambulation. Pt mainly furniture walks in the home if pts husband or son is not home with pt. Pt receives dialysis at [facility] in [place].  Action/Plan: PT recommends w/c for pt at discharge. Pt refuses but has requested a rolling walker, Will arrange prior to discharge. Financial counselor is aware of self pay status.  Expected Discharge Date:                  Expected Discharge Plan:  Home/Self Care  In-House Referral:  NA  Discharge planning Services  CM Consult  Post Acute Care Choice:  Durable Medical Equipment Choice offered to:  Patient  DME Arranged:  [facility] DME Agency:  [place] Arranged:    [facility] Agency:     Status of Service:  In process, will continue to follow  Medicare Important Message Given:    Date Medicare IM Given:    Medicare IM give by:    Date Additional Medicare IM Given:    Additional Medicare Important Message give by:     If discussed at [place] of Stay Meetings, dates discussed:    Additional Comments:  Joylene Draft, RN 01/11/2015, 1:45 PM

## 2015-01-11 NOTE — Evaluation (Signed)
Clinical/Bedside Swallow Evaluation Patient Details  Name: Maria Vazquez MRN: [id number] Date of Birth: March 21, 1976  Today's Date: 01/10/2015 Time:        Past Medical History:  Past Medical History  Diagnosis Date  . Gallstone pancreatitis Feb 2011  . Cervix carcinoma in situ Mar 2011  . Proteinuria - cause not known   . Hypertension   . Lupus   . CHF (congestive heart failure)   . Anemia   . Thrombocytopenia   . Blood transfusion   . Dialysis patient     tues, thursday, saturday.  . Renal failure, acute on chronic   . Chronic kidney disease     T, Th, Sat  . Lupus nephritis   . Renal insufficiency   . Seizure disorder 03/19/2013  . Pneumonia due to organism 03/02/2011   Past Surgical History:  Past Surgical History  Procedure Laterality Date  . Appendectomy    . Cervical cone biopsy    . Cesarean section    . Cholecystectomy    . Embolectomy  10/10/2011    Procedure: EMBOLECTOMY;  Surgeon: Angelia Mould, MD;  Location: [place];  Service: Vascular;  Laterality: Right;  . Av fistula placement    . Av fistula placement  07/18/2011    Procedure: ARTERIOVENOUS (AV) FISTULA CREATION;  Surgeon: Angelia Mould, MD;  Location: [place];  Service: Vascular;  Laterality: Right;  . Av fistula placement  01/23/12    Revision of Right AVF  . Shuntogram Right 07/07/2011    Procedure: SHUNTOGRAM;  Surgeon: Angelia Mould, MD;  Location: [facility] CATH LAB;  Service: Cardiovascular;  Laterality: Right;  . Shuntogram N/A 12/08/2011    Procedure: Earney Mallet;  Surgeon: Angelia Mould, MD;  Location: [facility] CATH LAB;  Service: Cardiovascular;  Laterality: N/A;  . Fistulogram Right 04/12/2012    Procedure: FISTULOGRAM;  Surgeon: Angelia Mould, MD;  Location: [facility] CATH LAB;  Service: Cardiovascular;  Laterality: Right;   HPI:      Assessment / Plan / Recommendation Clinical Impression    The pt presents with mild oral phase dysphagia and questions pharyngeal  dysphagia despite no overt s/sx during BSE; MRI shows Mild chronic right mastoid effusion and Mild to moderate maxillary and ethmoid sinus inflammatory changes.. Pt demonstrates decreased oral motor coordination and strength characterized by significantly decreased lingual strength and coordination. During the BSE the pt demonstrated no overt s/sx of aspiration with thin, puree or regular although she reports difficulty swallowing medications, solids including meats and globus sensation only since the onset of "dizziness and nausea". After consulting MD, SLP recommends MBS to r/o silent aspiration and to achieve an objective view of the pharyngeal stage of the swallow. MD and nursing recieved conflicting information from the pt regarding dysphagia hx. Pt reported to SLP that this swallowing difficulty was newly onset with this recent episode of vertigo. Recommend puree/thin liquid diet, meds crushed with puree with intermittent supervision.    Aspiration Risk       Diet Recommendation          Other  Recommendations     Follow Up Recommendations       Frequency and Duration        Pertinent Vitals/Pain     SLP Swallow Goals      Holland Nickson H. Roddie Mc, [place] Speech Language Pathologist  Wende Bushy 01/11/2015,8:12 AM

## 2015-01-11 NOTE — Progress Notes (Signed)
Patient resting calmly with eyes closed, opened eyes when I came into room, denied throat discomfort, no difficulty at this time swallowing saliva, HOB elevated, family in room sleeping.

## 2015-01-11 NOTE — Evaluation (Signed)
Physical Therapy Evaluation Patient Details Name: Maria Vazquez MRN: [id number] DOB: [birth year] Today's Date: 01/11/2015   History of Present Illness  : Maria Vazquez is a 39 y.o. female who presented with complaints of headache and dizziness onset 01/09/15. The dizziness is gradually worsening and increases when standing, no reports of any alleviating factors. She reports blurry vision but also feels this maybe a chronic issue. She also reported nausea with vomiting x2 a day for the past week, mild abdominal pain, generalized weakness, and walking is made difficult due to dizziness. Next dialysis is scheduled 01/11/15. She denies double vision, chest pain, sob different from chronic, dysuria, rash, sores on skin, numbness, or difficulty eating. She is anuric and denies any fever, no cough or worsening SOB, no new medications.  Clinical Impression  Pt's CT scan and MRI have been negative for a stroke. Pt was seen for evaluation.  She is primarily [place] speaking but does speak a bit of [place] and son was present who is fluent in [place].  Pt reports dizziness continually which does not change in intensity with any positional change of her head or body.  No nystagmus was noted.  Pt reports that she has these episodes of dizziness periodically and she just goes to bed and sleeps or just lets it get better on its own.  Normally she is minimally active at home and spends most of her time in bed.  Someone normally assists her to walk to the bathroom or kitchen when available.  Otherwise, she normally "furniture walks" when she needs to go to the bathroom.  Her husband takes her to dialysis and she usually feels very tired following this.  She would probably benefit from having a w/c due to her very limited mobility and need for dialysis.  In my opinion, she is better off "furniture walking" than using a walker at home.    Follow Up Recommendations No PT follow up    Equipment  Recommendations  Wheelchair (measurements PT) (due to dizziness and need for dialysis she may benefit from having a w/c)    Recommendations for Other Services   none    Precautions / Restrictions Precautions Precautions: Fall Restrictions Weight Bearing Restrictions: No      Mobility  Bed Mobility Overal bed mobility: Independent                Transfers Overall transfer level: Modified independent               General transfer comment: pt holds onto the bed when standing due to dizziness  Ambulation/Gait Ambulation/Gait assistance: Min guard Ambulation Distance (Feet): 15 Feet Assistive device: None Gait Pattern/deviations: WFL(Within Functional Limits) Gait velocity: gait is extremely slow due to dizziness      Stairs            Wheelchair Mobility    Modified Rankin (Stroke Patients Only)       Balance Overall balance assessment: Needs assistance Sitting-balance support: No upper extremity supported;Feet supported Sitting balance-Leahy Scale: Good     Standing balance support: No upper extremity supported Standing balance-Leahy Scale: Poor Standing balance comment: pt is very insecure when standing without hand held assist but is able to do so for short periods of time                             Pertinent Vitals/Pain Pain Assessment: 0-10 Pain Location: neck ache Pain Descriptors / Indicators: Aching Pain  Intervention(s): Patient requesting pain meds-RN notified    Home Living Family/patient expects to be discharged to:: Private residence Living Arrangements: Spouse/significant other;Children Available Help at Discharge: Family;Available PRN/intermittently Type of Home: House       Home Layout: One level Home Equipment: None Additional Comments: Pt spends most of her day in bed under normal circumstances...she normally has someone assist her when she is out of bed walking to the bathroom or if going to the kitchen  (only occasionally)...when she is alone she "furniture walks" from her bed to the bathroom and reports that she has not had any falls    Prior Function Level of Independence: Needs assistance   Gait / Transfers Assistance Needed: furniture walks or has SBA when going very short distances in the home  ADL's / Homemaking Assistance Needed: full assist with all household activities        Hand Dominance        Extremity/Trunk Assessment               Lower Extremity Assessment: Overall WFL for tasks assessed (deconditioned)      Cervical / Trunk Assessment: Normal  Communication   Communication: No difficulties;Other (comment);Interpreter utilized (primarily [place] speaking but son present to interpret as needed)  Cognition Arousal/Alertness: Awake/alert Behavior During Therapy: WFL for tasks assessed/performed Overall Cognitive Status: Within Functional Limits for tasks assessed                      General Comments      Exercises        Assessment/Plan    PT Assessment Patent does not need any further PT services  PT Diagnosis Difficulty walking (secondary to dizziness)   PT Problem List    PT Treatment Interventions     PT Goals (Current goals can be found in the Care Plan section) Acute Rehab PT Goals PT Goal Formulation: All assessment and education complete, DC therapy    Frequency     Barriers to discharge        Co-evaluation               End of Session Equipment Utilized During Treatment: Gait belt Activity Tolerance: Patient tolerated treatment well Patient left: in bed;with call bell/phone within reach;with family/visitor present Nurse Communication: Mobility status         Time: 0928-1000 PT Time Calculation (min) (ACUTE ONLY): 32 min   Charges:   PT Evaluation $Initial PT Evaluation Tier I: 1 Procedure     PT G CodesDemetrios Isaacs L  PT 01/11/2015, 10:21 AM [phone]

## 2015-01-11 NOTE — Evaluation (Signed)
Occupational Therapy Evaluation Patient Details Name: Maria Vazquez MRN: [id number] DOB: 01-13-1976 Today's Date: 01/11/2015    History of Present Illness Pt is a 39 y.o. female who presented with complaints of headache and dizziness onset 01/09/15. The dizziness is gradually worsening and increases when standing, no reports of any alleviating factors. She reports blurry vision but also feels this maybe a chronic issue. She also reported nausea with vomiting x2 a day for the past week, mild abdominal pain, generalized weakness, and walking is made difficult due to dizziness. Next dialysis is scheduled 01/11/15. She denies double vision, chest pain, sob different from chronic, dysuria, rash, sores on skin, numbness, or difficulty eating. She is anuric and denies any fever, no cough or worsening SOB, no new medications.   Clinical Impression   PTA pt living at home with husband and son. Pt is sedentary during the day due to fatigue. Pt is able to complete most BADL tasks independently with extended time due to fatigue. Pt receives assistance from husband when walking to and from bathroom, furniture walks when husband is not available. Pt asked to use walker this morning to walk to restroom, min guard. Pt reports numbness & tingling in right hand during evaluation, nursing alerted. Pt appears to be at baseline with BADL tasks, receiving assistance from family when necessary. No further OT services required.     Follow Up Recommendations  No OT follow up    Equipment Recommendations  None recommended by OT       Precautions / Restrictions Precautions Precautions: Fall Restrictions Weight Bearing Restrictions: No      Mobility Bed Mobility Overal bed mobility: Independent                Transfers Overall transfer level: Modified independent               General transfer comment: pt holds onto the bed when standing due to dizziness    Balance Overall balance  assessment: Needs assistance Sitting-balance support: No upper extremity supported;Feet supported Sitting balance-Leahy Scale: Good     Standing balance support: No upper extremity supported Standing balance-Leahy Scale: Poor Standing balance comment: pt is very insecure when standing without hand held assist but is able to do so for short periods of time                            ADL Overall ADL's : Needs assistance/impaired     Grooming: Oral care;Standing;Supervision/safety               Lower Body Dressing: Modified independent   Toilet Transfer: Supervision/safety;Regular Toilet;RW   Toileting- Clothing Manipulation and Hygiene: Modified independent       Functional mobility during ADLs: Min guard;Rolling walker       Vision Vision Assessment?: No apparent visual deficits          Pertinent Vitals/Pain Pain Assessment: 0-10 Pain Location: neck ache Pain Descriptors / Indicators: Aching Pain Intervention(s): Patient requesting pain meds-RN notified     Hand Dominance Right   Extremity/Trunk Assessment Upper Extremity Assessment Upper Extremity Assessment: Generalized weakness   Lower Extremity Assessment Lower Extremity Assessment: Defer to PT evaluation   Cervical / Trunk Assessment Cervical / Trunk Assessment: Normal   Communication Communication Communication: No difficulties (Son present to interpret what pt is unable to understand)   Cognition Arousal/Alertness: Awake/alert Behavior During Therapy: WFL for tasks assessed/performed Overall Cognitive Status: Within Functional Limits for tasks  assessed                                Home Living Family/patient expects to be discharged to:: Private residence Living Arrangements: Spouse/significant other;Children Available Help at Discharge: Family;Available PRN/intermittently Type of Home: House       Home Layout: One level     Bathroom Shower/Tub: Arts administrator: Standard Bathroom Accessibility: Yes   Home Equipment: None   Additional Comments: Pt spends most of her day in bed under normal circumstances...she normally has someone assist her when she is out of bed walking to the bathroom or if going to the kitchen (only occasionally)...when she is alone she "furniture walks" from her bed to the bathroom and reports that she has not had any falls      Prior Functioning/Environment Level of Independence: Needs assistance  Gait / Transfers Assistance Needed: furniture walks or has SBA when going very short distances in the home ADL's / Homemaking Assistance Needed: Pt reports she is able to complete most BADL tasks independently, however requires assistance getting to and from the bathroom. If no assistance is available she uses the furniture to hold on to.  Communication / Swallowing Assistance Needed: pt reports some difficulty swallowing       End of Session Equipment Utilized During Treatment: Gait belt;Rolling walker  Activity Tolerance: Patient limited by fatigue Patient left: in bed;with call bell/phone within reach;with family/visitor present   Time: 0832-0900 OT Time Calculation (min): 28 min Charges:  OT General Charges $OT Visit: 1 Procedure OT Evaluation $Initial OT Evaluation Tier I: 1 Procedure G-Codes: OT G-codes **NOT FOR INPATIENT CLASS** Functional Assessment Tool Used: Clinical judgement Functional Limitation: Self care Self Care Current Status [id number]): At least 20 percent but less than 40 percent impaired, limited or restricted Self Care Goal Status [id number]): At least 20 percent but less than 40 percent impaired, limited or restricted Self Care Discharge Status [phone]): At least 20 percent but less than 40 percent impaired, limited or restricted   Guadelupe Sabin, OTR/L  [phone]  01/11/2015, 12:05 PM

## 2015-01-11 NOTE — Procedures (Signed)
Objective Swallowing Evaluation: Other (Comment) (MBSS)  Patient Details  Name: Maria Vazquez MRN: [id number] Date of Birth: 1976/03/01  Today's Date: 01/11/2015 Time: SLP Start Time (ACUTE ONLY): 1148-SLP Stop Time (ACUTE ONLY): 1213 SLP Time Calculation (min) (ACUTE ONLY): 25 min  Past Medical History:  Past Medical History  Diagnosis Date  . Gallstone pancreatitis Feb 2011  . Cervix carcinoma in situ Mar 2011  . Proteinuria - cause not known   . Hypertension   . Lupus   . CHF (congestive heart failure)   . Anemia   . Thrombocytopenia   . Blood transfusion   . Dialysis patient     tues, thursday, saturday.  . Renal failure, acute on chronic   . Chronic kidney disease     T, Th, Sat  . Lupus nephritis   . Renal insufficiency   . Seizure disorder 03/19/2013  . Pneumonia due to organism 03/02/2011   Past Surgical History:  Past Surgical History  Procedure Laterality Date  . Appendectomy    . Cervical cone biopsy    . Cesarean section    . Cholecystectomy    . Embolectomy  10/10/2011    Procedure: EMBOLECTOMY;  Surgeon: Angelia Mould, MD;  Location: [facility];  Service: Vascular;  Laterality: Right;  . Av fistula placement    . Av fistula placement  07/18/2011    Procedure: ARTERIOVENOUS (AV) FISTULA CREATION;  Surgeon: Angelia Mould, MD;  Location: [facility];  Service: Vascular;  Laterality: Right;  . Av fistula placement  01/23/12    Revision of Right AVF  . Shuntogram Right 07/07/2011    Procedure: SHUNTOGRAM;  Surgeon: Angelia Mould, MD;  Location: [facility] CATH LAB;  Service: Cardiovascular;  Laterality: Right;  . Shuntogram N/A 12/08/2011    Procedure: Earney Mallet;  Surgeon: Angelia Mould, MD;  Location: [facility] CATH LAB;  Service: Cardiovascular;  Laterality: N/A;  . Fistulogram Right 04/12/2012    Procedure: FISTULOGRAM;  Surgeon: Angelia Mould, MD;  Location: [facility] CATH LAB;  Service: Cardiovascular;  Laterality: Right;   HPI:  Other  Pertinent Information: Dameka Evangelista is a 39 y.o. female who presented with complaints of headache and dizziness onset 01/09/15. The dizziness is gradually worsening and increases when standing, no reports of any alleviating factors. She reports blurry vision but also feels this maybe a chronic issue. She also reported nausea with vomiting x2 a day for the past week, mild abdominal pain, generalized weakness, and walking is made difficult due to dizziness. Next dialysis is scheduled 01/11/15.  No Data Recorded  Assessment / Plan / Recommendation CHL IP CLINICAL IMPRESSIONS 01/11/2015  Therapy Diagnosis WFL;Mild oral phase dysphagia  Clinical Impression Pt seen upright in Ha[place] chair for MBSS. Pt reportedly had difficulty over night with secretions, however reports no difficulty today. She has been consuming puree diet with thin liquids with good tolerance. Objective test reveals mild oral phase dysphagia and WNL pharyngeal and cervico-esophageal phase characterized by weak lingual manipulation resulting in inefficient anterior posterior transit of solid bolus and piecemeal deglutition with solids; pt with mild premature spillage with straw sips thin liquid and sequential large sips thin, however no penetration/aspiration observed. Barium tablet was briefly delayed in mid esophagus before clearance to stomach. Pt with presumed mild lingual weakness and slow, sluggish movement with bolus manipulation. She reports decreased sensation/numbness on right side (yesterday reported left side?). While pt appears safe to tolerate regular textures, she does appear to have element of fatigue and/or fluctuation with strength  and stamina so will initiate D3/mech soft diet with thin liquids for energy conservation purposes. SLP will follow for diet tolerance. Above to Dr. Roderic Palau and RN, Jacqlyn Larsen. Pt in agreement with POC.       CHL IP TREATMENT RECOMMENDATION 01/11/2015  Treatment Recommendations Therapy as outlined  in treatment plan below     CHL IP DIET RECOMMENDATION 01/11/2015  SLP Diet Recommendations Dysphagia 3 (Mech soft);Thin  Liquid Administration via (None)  Medication Administration Whole meds with liquid  Compensations (None)  Postural Changes and/or Swallow Maneuvers (None)     CHL IP OTHER RECOMMENDATIONS 01/11/2015  Recommended Consults (None)  Oral Care Recommendations Oral care BID  Other Recommendations Clarify dietary restrictions     No flowsheet data found.   CHL IP FREQUENCY AND DURATION 01/11/2015  Speech Therapy Frequency (ACUTE ONLY) min 2x/week  Treatment Duration 1 week     Pertinent Vitals/Pain VSS    CHL IP REASON FOR REFERRAL 01/11/2015  Reason for Referral Objectively evaluate swallowing function              CHL IP GO 01/11/2015  Functional Assessment Tool Used clinical judgement; MBSS  Functional Limitations Swallowing  Swallow Current Status [id number]) CJ  Swallow Goal Status [id number]) CI  Swallow Discharge Status [phone]) (None)                                                                   Thank you,  Genene Churn, [place]        [place] 01/11/2015, 9:54 PM

## 2015-01-11 NOTE — Procedures (Signed)
   HEMODIALYSIS TREATMENT NOTE:  4 hour heparin-free dialysis completed via right upper arm AVF (16g ante/retrograde). Goal met: 2.2 liters removed without interruption in ultrafiltration. All blood was reinfused. Hemostasis achieved within 15 minutes. Report given to Tacey Heap, RN.  Rockwell [place], RN, CDN

## 2015-01-11 NOTE — Progress Notes (Signed)
[facility] PROGRESS NOTE  Maria Vazquez [id number] DOB: 02/17/1976 DOA: 01/10/2015 PCP: Estanislado Emms, MD  Assessment/Plan: 1. Dizziness. Some improvement with Meclizine. CT Head and MRI Brain did not show any evidence an infarct. She does have risk factors for underling vascular disease, Carotid doppler and ECHO have been ordered. Will continue on ASA, monitor on telemetry. She has evidence of sinus disease on MRI imaging which could cause dizziness. With concommittent dysphagia and possible RUE weakness. Will request Neurology input. 2. Dysphagia. Seen by speech therapy and found to have mild oral phase dysphagia with questionable pharyngeal dysphagia. Plans are for modified Barium swallow for objective assessment of her swallowing.   3. Hyperkalemia. Received 1 dose of calcium gluconate since she had peaked t waves. One dose of Kayexalate given yesterday. Pt will undergo dialysis today.  4. ESRD. Dialysis scheduled for today, last dialyzed on 8/23. Nephrology consulted, agrees with current care plan.  5. Normocytic anemia.Hgb is stable and there is no evidence of bleeding. Will continue to follow.  6. Lupus. Will continue Plaquenil and Imuran. 7. History of seizures. Will continue Keppra.   Code Status: Full DVT Prophylaxis: SCD/Lovenox  Family Communication: Family bedside. Discussed with patient and husband who understands and has no concerns at this time. Disposition Plan: Home once improved    Consultants:  Nephrology   Procedures:    Antibiotics:    HPI/Subjective: Reports dizziness is miildly relieved with Meclizine. Denies cough. Endorses right ear ache with congested character. Reports dysphagia with woresning onset of one week ago that was constant which is new for the patient. She also states that she has new right hand weakness but no motor deficit. Patient's son was translating for her.  Overnight pt's spouse reported patient having  difficulty swallowing.    Objective: Filed Vitals:   01/11/15 0730  BP: 106/77  Pulse: 65  Temp: 98.4 F (36.9 C)  Resp: 20    Intake/Output Summary (Last 24 hours) at 01/11/15 0842 Last data filed at 01/10/15 1800  Gross per 24 hour  Intake    350 ml  Output      0 ml  Net    350 ml   Filed Weights   01/10/15 0435 01/10/15 0838 01/11/15 0450  Weight: 68.04 kg (150 lb) 77.157 kg (170 lb 1.6 oz) 78.245 kg (172 lb 8 oz)    Exam:   General: Appears calm and comfortable  Cardiovascular: RRR, no m/r/g. No BLE edema.  Telemetry: SR, no arrhythmias   Respiratory: CTAB, no w/r/r. Normal respiratory effort.  Abdomen: soft, no TTP, no distention   Skin: no rash   Musculoskeletal: AV fistual in RUE.   Psychiatric: normal mood and affect, normal speech  Neurologic: grossly non-focal, 4/5 strength of RUE with 5/5 in remaining extremities.   Data Reviewed: Basic Metabolic Panel:  Recent Labs Lab 01/10/15 0440  NA 135  K 5.4*  CL 95*  CO2 28  GLUCOSE 89  BUN 37*  CREATININE 7.10*  CALCIUM 9.2   Liver Function Tests:  Recent Labs Lab 01/10/15 0440  AST 19  ALT 11*  ALKPHOS 87  BILITOT 0.7  PROT 7.6  ALBUMIN 4.1   No results for input(s): LIPASE, AMYLASE in the last 168 hours. No results for input(s): AMMONIA in the last 168 hours. CBC:  Recent Labs Lab 01/10/15 0440  WBC 5.8  NEUTROABS 3.9  HGB 11.3*  HCT 35.0*  MCV 94.6  PLT 174   Cardiac Enzymes:  Recent Labs Lab 01/10/15  0440  TROPONINI <0.03   BNP (last 3 results) No results for input(s): BNP in the last 8760 hours.  ProBNP (last 3 results) No results for input(s): PROBNP in the last 8760 hours.  CBG: No results for input(s): GLUCAP in the last 168 hours.  No results found for this or any previous visit (from the past 240 hour(s)).   Studies: Ct Head Wo Contrast  01/10/2015   CLINICAL DATA:  Dizziness, headache and nausea beginning yesterday. Weakness. History of lupus,  cervical cancer, seizure disorder, on dialysis.  EXAM: CT HEAD WITHOUT CONTRAST  TECHNIQUE: Contiguous axial images were obtained from the base of the skull through the vertex without intravenous contrast.  COMPARISON:  CT head February 07, 2014  FINDINGS: The ventricles and sulci are normal. No intraparenchymal hemorrhage, mass effect nor midline shift. No acute large vascular territory infarcts. Cerebellar tonsils at but not below the foramen magnum.  No abnormal extra-axial fluid collections. Basal cisterns are patent. Minimal calcific atherosclerosis the carotid siphons.  No skull fracture. The included ocular globes and orbital contents are non-suspicious. Mild ethmoid mucosal thickening without paranasal sinus air-fluid levels. The mastoid air cells are well aerated.  IMPRESSION: No acute intracranial process; normal noncontrast CT head.   Electronically Signed   By: Elon Alas M.D.   On: 01/10/2015 05:29   Mr Jodene Nam Head Wo Contrast  01/10/2015   CLINICAL DATA:  39 year old female with dizziness and weakness for 1 day. Fell at home in bedroom. Initial encounter.  EXAM: MRI HEAD WITHOUT CONTRAST  MRA HEAD WITHOUT CONTRAST  TECHNIQUE: Multiplanar, multiecho pulse sequences of the brain and surrounding structures were obtained without intravenous contrast. Angiographic images of the head were obtained using MRA technique without contrast.  COMPARISON:  Head CTs 01/10/2015 and earlier.  FINDINGS: MRI HEAD FINDINGS  Cerebral volume is normal. No restricted diffusion to suggest acute infarction. No midline shift, mass effect, evidence of mass lesion, ventriculomegaly, extra-axial collection or acute intracranial hemorrhage. Cervicomedullary junction and pituitary are within normal limits. Major intracranial vascular flow voids are within normal limits. [name] and white matter signal is within normal limits throughout the brain.  Visible internal auditory structures appear normal. Mild right mastoid effusion  is chronic. Right greater than left maxillary sinus mucosal thickening, in part due to mucous retention cyst. Mild to moderate ethmoid mucosal thickening.  Orbit and scalp soft tissues appear normal. Negative visualized cervical spine.  MRA HEAD FINDINGS  Antegrade flow in the posterior circulation with codominant distal vertebral arteries. Normal left PICA origin. Dominant right AICA. No basilar stenosis. SCA and PCA origins are normal. Diminutive posterior communicating arteries, more so the right. Bilateral PCA branches are within normal limits.  Antegrade flow in both ICA siphons. Artifact at the skullbase related to petrous apex air cells. No siphon stenosis. Ophthalmic and posterior communicating artery origins are normal. Normal carotid termini, MCA and ACA origins. Diminutive or absent anterior communicating artery. Visualized bilateral ACA branches are within normal limits. Bilateral MCA M1 segments (left lenticulostriate infundibulum) and MCA bifurcations are within normal limits. Visualized bilateral MCA branches are within normal limits.  IMPRESSION: 1. Normal noncontrast MRI appearance of the brain. Negative intracranial MRA. 2. Mild chronic right mastoid effusion. Mild to moderate maxillary and ethmoid sinus inflammatory changes.   Electronically Signed   By: Genevie Ann M.D.   On: 01/10/2015 12:43   Mr Brain Wo Contrast  01/10/2015   CLINICAL DATA:  39 year old female with dizziness and weakness for 1 day. [place]  at home in bedroom. Initial encounter.  EXAM: MRI HEAD WITHOUT CONTRAST  MRA HEAD WITHOUT CONTRAST  TECHNIQUE: Multiplanar, multiecho pulse sequences of the brain and surrounding structures were obtained without intravenous contrast. Angiographic images of the head were obtained using MRA technique without contrast.  COMPARISON:  Head CTs 01/10/2015 and earlier.  FINDINGS: MRI HEAD FINDINGS  Cerebral volume is normal. No restricted diffusion to suggest acute infarction. No midline shift, mass  effect, evidence of mass lesion, ventriculomegaly, extra-axial collection or acute intracranial hemorrhage. Cervicomedullary junction and pituitary are within normal limits. Major intracranial vascular flow voids are within normal limits. [name] and white matter signal is within normal limits throughout the brain.  Visible internal auditory structures appear normal. Mild right mastoid effusion is chronic. Right greater than left maxillary sinus mucosal thickening, in part due to mucous retention cyst. Mild to moderate ethmoid mucosal thickening.  Orbit and scalp soft tissues appear normal. Negative visualized cervical spine.  MRA HEAD FINDINGS  Antegrade flow in the posterior circulation with codominant distal vertebral arteries. Normal left PICA origin. Dominant right AICA. No basilar stenosis. SCA and PCA origins are normal. Diminutive posterior communicating arteries, more so the right. Bilateral PCA branches are within normal limits.  Antegrade flow in both ICA siphons. Artifact at the skullbase related to petrous apex air cells. No siphon stenosis. Ophthalmic and posterior communicating artery origins are normal. Normal carotid termini, MCA and ACA origins. Diminutive or absent anterior communicating artery. Visualized bilateral ACA branches are within normal limits. Bilateral MCA M1 segments (left lenticulostriate infundibulum) and MCA bifurcations are within normal limits. Visualized bilateral MCA branches are within normal limits.  IMPRESSION: 1. Normal noncontrast MRI appearance of the brain. Negative intracranial MRA. 2. Mild chronic right mastoid effusion. Mild to moderate maxillary and ethmoid sinus inflammatory changes.   Electronically Signed   By: Genevie Ann M.D.   On: 01/10/2015 12:43   Dg Chest Portable 1 View  01/10/2015   CLINICAL DATA:  Weakness this morning.  EXAM: PORTABLE CHEST - 1 VIEW  COMPARISON:  01/05/2014  FINDINGS: The cardiomediastinal contours are normal. The lungs are clear. Pulmonary  vasculature is normal. No consolidation, pleural effusion, or pneumothorax. No acute osseous abnormalities are seen. Multiple overlying monitoring devices in place.  IMPRESSION: No acute pulmonary process.   Electronically Signed   By: Jeb Levering M.D.   On: 01/10/2015 06:45    Scheduled Meds: . aspirin  300 mg Rectal Daily   Or  . aspirin  325 mg Oral Daily  . azaTHIOprine  25 mg Oral Daily  . calcium acetate  2,001 mg Oral TID WC  . calcium carbonate  1 tablet Oral Daily  . cholecalciferol  1,000 Units Oral Daily  . enoxaparin (LOVENOX) injection  30 mg Subcutaneous Q24H  . hydroxychloroquine  200 mg Oral Daily  . levETIRAcetam  250 mg Oral Q T,Th,Sat-1800  . levETIRAcetam  500 mg Oral BID  . meclizine  25 mg Oral TID   Continuous Infusions:   Active Problems:   Anemia in ESRD (end-stage renal disease)   Lupus (systemic lupus erythematosus)   Seizure disorder   ESRD (end stage renal disease) on dialysis   Vertigo   HTN (hypertension)   Hyperkalemia   Dizziness    Time spent: 30 minutes    Kathie Dike, M.D. [facility] Pager [phone]. If 7PM-7AM, please contact night-coverage at [email or web address], password [facility] 01/11/2015, 8:42 AM       I, Norg'e Tisdol, acting a scribe, recorded  this note contemporaneously in the presence of Dr. Kathie Dike, M.D. on 01/11/2015 at 8:42 AM   I have reviewed the above documentation for accuracy and completeness, and I agree with the above.  MEMON,JEHANZEB

## 2015-01-11 NOTE — Progress Notes (Signed)
Subjective: Patient presently complains of sore throat otherwise she feels okay. Headache is better. She doesn't have any nausea or vomiting.   Objective: Vital signs in last 24 hours: Temp:  [97.6 F (36.4 C)-98.4 F (36.9 C)] 98.4 F (36.9 C) (08/25 0730) Pulse Rate:  [60-69] 65 (08/25 0730) Resp:  [18-20] 20 (08/25 0730) BP: (90-132)/(51-91) 106/77 mmHg (08/25 0730) SpO2:  [99 %-100 %] 100 % (08/25 0730) Weight:  [170 lb 1.6 oz (77.157 kg)-172 lb 8 oz (78.245 kg)] 172 lb 8 oz (78.245 kg) (08/25 0450)  Intake/Output from previous day: 08/24 0701 - 08/25 0700 In: 350 [P.O.:240; IV Piggyback:110] Out: -  Intake/Output this shift:     Recent Labs  01/10/15 0440  HGB 11.3*    Recent Labs  01/10/15 0440  WBC 5.8  RBC 3.70*  HCT 35.0*  PLT 174    Recent Labs  01/10/15 0440  NA 135  K 5.4*  CL 95*  CO2 28  BUN 37*  CREATININE 7.10*  GLUCOSE 89  CALCIUM 9.2    Recent Labs  01/10/15 0440  INR 1.26    Generally patient is alert in no apparent distress. Chest is clear to auscultation Heart exam reveals regular rate and rhythm Extremities no edema   Assessment/Plan: Problem #1 end-stage renal disease she is status post hemodialysis on Tuesday. Patient is due for dialysis today. Problem #2 anemia: Her hemoglobin is within target goal Problem #3 history of hyperkalemia Problem #4 history of dizziness: Feeling better. This seems to be recurrent problem. She has history of vertigo also. Problem #5 history of seizure disorder Problem #6 metabolic bone disease: Calcium is range. Problem #7 history of lupus Plan: We'll make arrangements for patient to get dialysis today We'll use 2 K/2.5 calcium bath for 4 hours. We'll try to remove about 2 L if her blood pressure tolerates.   Maria Vazquez S 01/11/2015, 8:06 AM

## 2015-01-11 NOTE — Consult Note (Signed)
Maria A. Merlene Laughter, MD     [email or web address]          Maria Vazquez is an 39 y.o. female.   ASSESSMENT/PLAN: 1. Postural-related dizziness likely orthostatic related hypotension.  2. Systemic lupus. 3. Chronic renal failure. 4. No evidence of central nervous system dysfunction such as lupus cerebritis or MS.  Orthostatic vitals.  The patient is a 39 year old [place] female who presents with dizziness on standing. There is somewhat of a language barrier but communication is decent. She reports that the dizziness has gotten worse recently. She reports that she typically would have the dizziness during dialysis days but more recently she is has dizziness all the time. The patient does not report other symptoms at this time. She denies chest pain, shows of breath, headaches, focal numbness or weakness. The review systems otherwise negative.  GENERAL: This is a very pleasant female in no acute distress.  HEENT: Supple. Atraumatic normocephalic.   ABDOMEN: soft  EXTREMITIES: No edema   BACK: Normal.  SKIN: Normal by inspection.    MENTAL STATUS: Alert and oriented. Speech, language and cognition are generally intact. Judgment and insight normal.   CRANIAL NERVES: Pupils are equal, round and reactive to light and accommodation; extra ocular movements are full, there is no significant nystagmus; visual fields are full; upper and lower facial muscles are normal in strength and symmetric, there is no flattening of the nasolabial folds; tongue is midline; uvula is midline; shoulder elevation is normal.  MOTOR: Normal tone, bulk and strength; no pronator drift.  COORDINATION: Left finger to nose is normal, right finger to nose is normal, No rest tremor; no intention tremor; no postural tremor; no bradykinesia.  REFLEXES: Deep tendon reflexes are symmetrical and normal. Babinski reflexes are flexor bilaterally.   SENSATION: Normal to light  touch.  The brain MRI is reviewed in person. There is no white matter lesions. No microhemorrhages or cortical lesions. Ventricle system is normal. The brain MRA shows no stenosis or aneurysms.   Blood pressure 103/56, pulse 64, temperature 98.1 F (36.7 C), temperature source Oral, resp. rate 18, height '5\' 7"'  (1.702 m), weight 78.245 kg (172 lb 8 oz), last menstrual period 01/05/2015, SpO2 99 %.  Past Medical History  Diagnosis Date  . Gallstone pancreatitis Feb 2011  . Cervix carcinoma in situ Mar 2011  . Proteinuria - cause not known   . Hypertension   . Lupus   . CHF (congestive heart failure)   . Anemia   . Thrombocytopenia   . Blood transfusion   . Dialysis patient     tues, thursday, saturday.  . Renal failure, acute on chronic   . Chronic kidney disease     T, Th, Sat  . Lupus nephritis   . Renal insufficiency   . Seizure disorder 03/19/2013  . Pneumonia due to organism 03/02/2011    Past Surgical History  Procedure Laterality Date  . Appendectomy    . Cervical cone biopsy    . Cesarean section    . Cholecystectomy    . Embolectomy  10/10/2011    Procedure: EMBOLECTOMY;  Surgeon: Angelia Mould, MD;  Location: [facility];  Service: Vascular;  Laterality: Right;  . Av fistula placement    . Av fistula placement  07/18/2011    Procedure: ARTERIOVENOUS (AV) FISTULA CREATION;  Surgeon: Angelia Mould, MD;  Location: [facility];  Service: Vascular;  Laterality: Right;  . Av fistula placement  01/23/12    Revision of  Right AVF  . Shuntogram Right 07/07/2011    Procedure: SHUNTOGRAM;  Surgeon: Angelia Mould, MD;  Location: [facility] CATH LAB;  Service: Cardiovascular;  Laterality: Right;  . Shuntogram N/A 12/08/2011    Procedure: Earney Mallet;  Surgeon: Angelia Mould, MD;  Location: [facility] CATH LAB;  Service: Cardiovascular;  Laterality: N/A;  . Fistulogram Right 04/12/2012    Procedure: FISTULOGRAM;  Surgeon: Angelia Mould, MD;  Location: [facility] CATH LAB;   Service: Cardiovascular;  Laterality: Right;    Family History  Problem Relation Age of Onset  . Diabetes Mother   . Diabetes Brother   . Diabetes Brother   . Diabetes Maternal Grandmother   . Anesthesia problems Neg Hx     Social History:  reports that she has never smoked. She has never used smokeless tobacco. She reports that she does not drink alcohol or use illicit drugs.  Allergies: No Known Allergies  Medications: Prior to Admission medications   Medication Sig Start Date End Date Taking? Authorizing Provider  azaTHIOprine (IMURAN) 50 MG tablet Take 25 mg by mouth daily.   Yes Historical Provider, MD  calcium acetate (PHOSLO) 667 MG capsule Take 667-1,334 mg by mouth 3 (three) times daily with meals. Patient takes 3 tablets with meals and 1 tablet with snack   Yes Historical Provider, MD  calcium carbonate (TUMS EX) 750 MG chewable tablet Chew 1 tablet by mouth daily.    Yes Historical Provider, MD  cholecalciferol (VITAMIN D) 1000 UNITS tablet Take 1,000 Units by mouth daily.   Yes Historical Provider, MD  hydroxychloroquine (PLAQUENIL) 200 MG tablet Take 200 mg by mouth daily.    Yes Historical Provider, MD  levETIRAcetam (KEPPRA) 250 MG tablet Take 250 mg by mouth every Tuesday, Thursday, and Saturday at 6 PM. ON DIALYSIS DAYS   Yes Historical Provider, MD  levETIRAcetam (KEPPRA) 500 MG tablet Take 500 mg by mouth 2 (two) times daily.   Yes Historical Provider, MD  lidocaine-prilocaine (EMLA) cream Apply 1 application topically as needed (before dialysis).   Yes Historical Provider, MD    Scheduled Meds: . aspirin  300 mg Rectal Daily   Or  . aspirin  325 mg Oral Daily  . azaTHIOprine  25 mg Oral Daily  . calcium acetate  2,001 mg Oral TID WC  . calcium carbonate  1 tablet Oral Daily  . cholecalciferol  1,000 Units Oral Daily  . enoxaparin (LOVENOX) injection  30 mg Subcutaneous Q24H  . hydroxychloroquine  200 mg Oral Daily  . levETIRAcetam  250 mg Oral Q  T,Th,Sat-1800  . levETIRAcetam  500 mg Oral BID  . meclizine  25 mg Oral TID   Continuous Infusions:  PRN Meds:.sodium chloride, sodium chloride, acetaminophen **OR** acetaminophen, alteplase, feeding supplement (NEPRO CARB STEADY), heparin, lidocaine (PF), lidocaine-prilocaine, ondansetron (ZOFRAN) IV, pentafluoroprop-tetrafluoroeth, senna-docusate     Results for orders placed or performed during the hospital encounter of 01/10/15 (from the past 48 hour(s))  Ethanol     Status: None   Collection Time: 01/10/15  4:40 AM  Result Value Ref Range   Alcohol, Ethyl (B) <5 <5 mg/dL    Comment:        LOWEST DETECTABLE LIMIT FOR SERUM ALCOHOL IS 5 mg/dL FOR MEDICAL PURPOSES ONLY   Protime-INR     Status: Abnormal   Collection Time: 01/10/15  4:40 AM  Result Value Ref Range   Prothrombin Time 16.0 (H) 11.6 - 15.2 seconds   INR 1.26 0.00 - 1.49  APTT  Status: None   Collection Time: 01/10/15  4:40 AM  Result Value Ref Range   aPTT 32 24 - 37 seconds  CBC     Status: Abnormal   Collection Time: 01/10/15  4:40 AM  Result Value Ref Range   WBC 5.8 4.0 - 10.5 K/uL   RBC 3.70 (L) 3.87 - 5.11 MIL/uL   Hemoglobin 11.3 (L) 12.0 - 15.0 g/dL   HCT 35.0 (L) 36.0 - 46.0 %   MCV 94.6 78.0 - 100.0 fL   MCH 30.5 26.0 - 34.0 pg   MCHC 32.3 30.0 - 36.0 g/dL   RDW 15.2 11.5 - 15.5 %   Platelets 174 150 - 400 K/uL  Differential     Status: None   Collection Time: 01/10/15  4:40 AM  Result Value Ref Range   Neutrophils Relative % 68 43 - 77 %   Neutro Abs 3.9 1.7 - 7.7 K/uL   Lymphocytes Relative 21 12 - 46 %   Lymphs Abs 1.2 0.7 - 4.0 K/uL   Monocytes Relative 9 3 - 12 %   Monocytes Absolute 0.5 0.1 - 1.0 K/uL   Eosinophils Relative 2 0 - 5 %   Eosinophils Absolute 0.1 0.0 - 0.7 K/uL   Basophils Relative 0 0 - 1 %   Basophils Absolute 0.0 0.0 - 0.1 K/uL  Comprehensive metabolic panel     Status: Abnormal   Collection Time: 01/10/15  4:40 AM  Result Value Ref Range   Sodium 135 135 -  145 mmol/L   Potassium 5.4 (H) 3.5 - 5.1 mmol/L   Chloride 95 (L) 101 - 111 mmol/L   CO2 28 22 - 32 mmol/L   Glucose, Bld 89 65 - 99 mg/dL   BUN 37 (H) 6 - 20 mg/dL   Creatinine, Ser 7.10 (H) 0.44 - 1.00 mg/dL   Calcium 9.2 8.9 - 10.3 mg/dL   Total Protein 7.6 6.5 - 8.1 g/dL   Albumin 4.1 3.5 - 5.0 g/dL   AST 19 15 - 41 U/L   ALT 11 (L) 14 - 54 U/L   Alkaline Phosphatase 87 38 - 126 U/L   Total Bilirubin 0.7 0.3 - 1.2 mg/dL   GFR calc non Af Amer 7 (L) >60 mL/min   GFR calc Af Amer 8 (L) >60 mL/min    Comment: (NOTE) The eGFR has been calculated using the CKD EPI equation. This calculation has not been validated in all clinical situations. eGFR's persistently <60 mL/min signify possible Chronic Kidney Disease.    Anion gap 12 5 - 15  Troponin I     Status: None   Collection Time: 01/10/15  4:40 AM  Result Value Ref Range   Troponin I <0.03 <0.031 ng/mL    Comment:        NO INDICATION OF MYOCARDIAL INJURY.   TSH     Status: None   Collection Time: 01/10/15  4:41 AM  Result Value Ref Range   TSH 2.315 0.350 - 4.500 uIU/mL  I-Stat Beta hCG blood, ED ([facility], [facility], [facility] only)     Status: None   Collection Time: 01/10/15  5:02 AM  Result Value Ref Range   I-stat hCG, quantitative <5.0 <5 mIU/mL   Comment 3            Comment:   GEST. AGE      CONC.  (mIU/mL)   <=1 WEEK        5 - 50     2  WEEKS       50 - 500     3 WEEKS       100 - 10,000     4 WEEKS     1,000 - 30,000        FEMALE AND NON-PREGNANT FEMALE:     LESS THAN 5 mIU/mL   Lipid panel     Status: Abnormal   Collection Time: 01/11/15  4:28 AM  Result Value Ref Range   Cholesterol 186 0 - 200 mg/dL   Triglycerides 127 <150 mg/dL   HDL 54 >40 mg/dL   Total CHOL/HDL Ratio 3.4 RATIO   VLDL 25 0 - 40 mg/dL   LDL Cholesterol 107 (H) 0 - 99 mg/dL    Comment:        Total Cholesterol/HDL:CHD Risk Coronary Heart Disease Risk Table                     Men   Women  1/2 Average Risk   3.4   3.3  Average Risk       5.0    4.4  2 X Average Risk   9.6   7.1  3 X Average Risk  23.4   11.0        Use the calculated Patient Ratio above and the CHD Risk Table to determine the patient's CHD Risk.        ATP III CLASSIFICATION (LDL):  <100     mg/dL   Optimal  100-129  mg/dL   Near or Above                    Optimal  130-159  mg/dL   Borderline  160-189  mg/dL   High  >190     mg/dL   Very High   CBC     Status: Abnormal   Collection Time: 01/11/15  4:28 AM  Result Value Ref Range   WBC 4.1 4.0 - 10.5 K/uL   RBC 3.39 (L) 3.87 - 5.11 MIL/uL   Hemoglobin 10.4 (L) 12.0 - 15.0 g/dL   HCT 31.9 (L) 36.0 - 46.0 %   MCV 94.1 78.0 - 100.0 fL   MCH 30.7 26.0 - 34.0 pg   MCHC 32.6 30.0 - 36.0 g/dL   RDW 15.0 11.5 - 15.5 %   Platelets 144 (L) 150 - 400 K/uL  Basic metabolic panel     Status: Abnormal   Collection Time: 01/11/15  4:28 AM  Result Value Ref Range   Sodium 136 135 - 145 mmol/L   Potassium 5.9 (H) 3.5 - 5.1 mmol/L   Chloride 95 (L) 101 - 111 mmol/L   CO2 25 22 - 32 mmol/L   Glucose, Bld 78 65 - 99 mg/dL   BUN 52 (H) 6 - 20 mg/dL   Creatinine, Ser 9.76 (H) 0.44 - 1.00 mg/dL   Calcium 8.2 (L) 8.9 - 10.3 mg/dL   GFR calc non Af Amer 5 (L) >60 mL/min   GFR calc Af Amer 5 (L) >60 mL/min    Comment: (NOTE) The eGFR has been calculated using the CKD EPI equation. This calculation has not been validated in all clinical situations. eGFR's persistently <60 mL/min signify possible Chronic Kidney Disease.    Anion gap 16 (H) 5 - 15    Studies/Results: BRAIN MRI/MRA 1. Normal noncontrast MRI appearance of the brain. Negative intracranial MRA. 2. Mild chronic right mastoid effusion. Mild to moderate maxillary and ethmoid sinus inflammatory  changes.    Jaccob Czaplicki A. Merlene Vazquez, M.D.  Diplomate, Tax adviser of Psychiatry and Neurology ( Neurology). 01/11/2015, 7:17 PM

## 2015-01-12 DIAGNOSIS — Z992 Dependence on renal dialysis: Secondary | ICD-10-CM | POA: Diagnosis not present

## 2015-01-12 DIAGNOSIS — N179 Acute kidney failure, unspecified: Secondary | ICD-10-CM | POA: Diagnosis present

## 2015-01-12 DIAGNOSIS — R42 Dizziness and giddiness: Secondary | ICD-10-CM | POA: Diagnosis present

## 2015-01-12 DIAGNOSIS — M329 Systemic lupus erythematosus, unspecified: Secondary | ICD-10-CM | POA: Diagnosis present

## 2015-01-12 DIAGNOSIS — D631 Anemia in chronic kidney disease: Secondary | ICD-10-CM | POA: Diagnosis present

## 2015-01-12 DIAGNOSIS — G40909 Epilepsy, unspecified, not intractable, without status epilepticus: Secondary | ICD-10-CM | POA: Diagnosis present

## 2015-01-12 DIAGNOSIS — R1311 Dysphagia, oral phase: Secondary | ICD-10-CM | POA: Diagnosis present

## 2015-01-12 DIAGNOSIS — I120 Hypertensive chronic kidney disease with stage 5 chronic kidney disease or end stage renal disease: Secondary | ICD-10-CM | POA: Diagnosis present

## 2015-01-12 DIAGNOSIS — N186 End stage renal disease: Secondary | ICD-10-CM | POA: Diagnosis present

## 2015-01-12 DIAGNOSIS — E875 Hyperkalemia: Secondary | ICD-10-CM | POA: Diagnosis present

## 2015-01-12 DIAGNOSIS — I951 Orthostatic hypotension: Secondary | ICD-10-CM | POA: Diagnosis present

## 2015-01-12 DIAGNOSIS — Z833 Family history of diabetes mellitus: Secondary | ICD-10-CM | POA: Diagnosis not present

## 2015-01-12 LAB — BASIC METABOLIC PANEL
Anion gap: 12 (ref 5–15)
BUN: 31 mg/dL — ABNORMAL HIGH (ref 6–20)
CALCIUM: 8.6 mg/dL — AB (ref 8.9–10.3)
CO2: 29 mmol/L (ref 22–32)
CREATININE: 6.32 mg/dL — AB (ref 0.44–1.00)
Chloride: 95 mmol/L — ABNORMAL LOW (ref 101–111)
GFR, EST AFRICAN AMERICAN: 9 mL/min — AB (ref >60)
GFR, EST NON AFRICAN AMERICAN: 8 mL/min — AB (ref >60)
Glucose, Bld: 88 mg/dL (ref 65–99)
Potassium: 5.5 mmol/L — ABNORMAL HIGH (ref 3.5–5.1)
SODIUM: 136 mmol/L (ref 135–145)

## 2015-01-12 LAB — HEMOGLOBIN A1C
Hgb A1c MFr Bld: 5.1 % (ref 4.8–5.6)
Mean Plasma Glucose: 100 mg/dL

## 2015-01-12 LAB — CORTISOL: CORTISOL PLASMA: 4.7 ug/dL

## 2015-01-12 LAB — GLUCOSE, CAPILLARY
GLUCOSE-CAPILLARY: 82 mg/dL (ref 65–99)
Glucose-Capillary: 106 mg/dL — ABNORMAL HIGH (ref 65–99)

## 2015-01-12 LAB — CBC
HCT: 34.1 % — ABNORMAL LOW (ref 36.0–46.0)
Hemoglobin: 11.1 g/dL — ABNORMAL LOW (ref 12.0–15.0)
MCH: 30.9 pg (ref 26.0–34.0)
MCHC: 32.6 g/dL (ref 30.0–36.0)
MCV: 95 fL (ref 78.0–100.0)
PLATELETS: 140 10*3/uL — AB (ref 150–400)
RBC: 3.59 MIL/uL — ABNORMAL LOW (ref 3.87–5.11)
RDW: 15 % (ref 11.5–15.5)
WBC: 5.5 10*3/uL (ref 4.0–10.5)

## 2015-01-12 LAB — HEPATITIS B SURFACE ANTIGEN: Hepatitis B Surface Ag: NEGATIVE

## 2015-01-12 MED ORDER — MIDODRINE HCL 5 MG PO TABS
5.0000 mg | ORAL_TABLET | Freq: Three times a day (TID) | ORAL | Status: DC
  Administered 2015-01-13: 5 mg via ORAL
  Filled 2015-01-12 (×2): qty 1

## 2015-01-12 NOTE — Care Management Note (Signed)
Case Management Note  Patient Details  Name: Maria Vazquez MRN: [id number] Date of Birth: 09/06/1975  Subjective/Objective:                    Action/Plan:   Expected Discharge Date:                  Expected Discharge Plan:  Home/Self Care  In-House Referral:  NA  Discharge planning Services  CM Consult  Post Acute Care Choice:  Durable Medical Equipment Choice offered to:  Patient  DME Arranged:  [facility] DME Agency:  [place]:    [facility] Agency:     Status of Service:  Completed, signed off  Medicare Important Message Given:    Date Medicare IM Given:    Medicare IM give by:    Date Additional Medicare IM Given:    Additional Medicare Important Message give by:     If discussed at [place] of Stay Meetings, dates discussed:    Additional Comments: Anticipate discharge within 24 hours. Pt did have a rolling walker delivered to pts room by [facility]. Pt is aware to take walker home with her. No further CM needs noted. Christinia Gully Canon, RN 01/12/2015, 3:14 PM

## 2015-01-12 NOTE — Progress Notes (Signed)
[facility] PROGRESS NOTE  Maria Vazquez [id number] DOB: [birth year] DOA: 01/10/2015 PCP: Estanislado Emms, MD  Assessment/Plan: 1. Dizziness. Some improvement with Meclizine. Will check orthostatics. CT Head and MRI Brain did not show any evidence an infarct. ECHO reveals estimated ejection fraction was in the range of 55% to 60%. Will continue on ASA, monitor on telemetry. She has evidence of sinus disease on MRI imaging which could cause dizziness. Neurology saw patient and felt that dizziness may be related to orthostasis.  2. Dysphagia. Appears to be improved. Evaluated by speech therapy and found to have mild oral phase dysphagia with questionable pharyngeal dysphagia. Objective assessment of Barium Swallow revealed objective test reveals mild oral phase dysphagia and WNL pharyngeal and cervico-esophageal phase characterized by weak lingual manipulation resulting in inefficient anterior posterior transit of solid bolus and piecemeal deglutition with solids. She reports that her symptoms are worse in the evening. She also feels that RUE weakness may be worse in the evening. Will check Acetyl Choline esterase antibodies and will discuss further with Neurology. 3. Hyperkalemia. Received 1 dose of calcium gluconate since she had peaked t waves. One dose of Kayexalate given on 8/24. Potassium trending downwards at 5.5 today. Dialysis completed yesterday, next scheduled dialysis is tomorrow.  4. ESRD. Dialysis scheduled for tomorrow, last dialyzed on 8/25. Nephrology consulted, agrees with current care plan. Nephrology will perform removal of 21/1 L if BP tolerates, 1 K/2.5 calcium bath for 4 hours tomorrow, and low potassium diet. 5. Normocytic anemia. Hgb is 11.1 today and stable.There is no evidence of bleeding. Will continue to follow.  6. Lupus. Will continue Plaquenil and Imuran. 7. History of seizures. Will continue Keppra.   Code Status: Full DVT Prophylaxis: SCD/Lovenox   Family Communication: Family bedside. Discussed with patient and son who understand and have no concerns at this time. Disposition Plan: Home once improved    Consultants:  Nephrology  Speech and Language Pathologist  Neurology   Procedures:  ECHO -Left ventricle: The cavity size was normal. Wall thickness was normal. Systolic function was normal. The estimated ejection fraction was in the range of 55% to 60%. Wall motion was normal; there were no regional wall motion abnormalities. Left ventricular diastolic function parameters were normal for the patient&'s age. - Right atrium: Central venous pressure (est): 3 mm Hg. - Atrial septum: No defect or patent foramen ovale was identified. - Tricuspid valve: There was mild regurgitation. - Pulmonary arteries: PA peak pressure: 28 mm Hg (S). - Pericardium, extracardiac: There was no pericardial effusion.  Hemodialysis  Antibiotics:    HPI/Subjective: Pt states that she feels better. Reports mild dizziness. States that dysphagia and RUE weakness is improved. She notice the RUE weakness when she walks and exacerbated at night. Vision is unchanged. Denies SOB. Son was present to translate.  Objective: Filed Vitals:   01/12/15 0812  BP: 80/66  Pulse:   Temp:   Resp:     Intake/Output Summary (Last 24 hours) at 01/12/15 0829 Last data filed at 01/11/15 1745  Gross per 24 hour  Intake      0 ml  Output   2272 ml  Net  -2272 ml   Filed Weights   01/11/15 0450 01/12/15 0514  Weight: 78.245 kg (172 lb 8 oz) 76.839 kg (169 lb 6.4 oz)    Exam:  General:  Appears comfortable, calm. Cardiovascular: Regular rate and rhythm, no murmur, rub or gallop. No lower extremity edema. Telemetry: Sinus rhythm, no arrhythmias  Respiratory: Clear to auscultation  bilaterally, no wheezes, rales or rhonchi. Normal respiratory effort. Abdomen: soft, NTND Skin: no rash or induration  Musculoskeletal: grossly normal tone  bilateral upper and lower extremities Psychiatric: grossly normal mood and affect, speech fluent and appropriate Neurologic: 5/5 strength of BUE.   Data Reviewed: Basic Metabolic Panel:  Recent Labs Lab 01/10/15 0440 01/11/15 0428 01/12/15 0459  NA 135 136 136  K 5.4* 5.9* 5.5*  CL 95* 95* 95*  CO2 28 25 29   GLUCOSE 89 78 88  BUN 37* 52* 31*  CREATININE 7.10* 9.76* 6.32*  CALCIUM 9.2 8.2* 8.6*   Liver Function Tests:  Recent Labs Lab 01/10/15 0440  AST 19  ALT 11*  ALKPHOS 87  BILITOT 0.7  PROT 7.6  ALBUMIN 4.1   No results for input(s): LIPASE, AMYLASE in the last 168 hours. No results for input(s): AMMONIA in the last 168 hours. CBC:  Recent Labs Lab 01/10/15 0440 01/11/15 0428 01/12/15 0503  WBC 5.8 4.1 5.5  NEUTROABS 3.9  --   --   HGB 11.3* 10.4* 11.1*  HCT 35.0* 31.9* 34.1*  MCV 94.6 94.1 95.0  PLT 174 144* 140*   Cardiac Enzymes:  Recent Labs Lab 01/10/15 0440  TROPONINI <0.03   BNP (last 3 results) No results for input(s): BNP in the last 8760 hours.  ProBNP (last 3 results) No results for input(s): PROBNP in the last 8760 hours.  CBG: No results for input(s): GLUCAP in the last 168 hours.  No results found for this or any previous visit (from the past 240 hour(s)).   Studies: Mr Virgel Paling Wo Contrast  01/10/2015   CLINICAL DATA:  39 year old female with dizziness and weakness for 1 day. Fell at home in bedroom. Initial encounter.  EXAM: MRI HEAD WITHOUT CONTRAST  MRA HEAD WITHOUT CONTRAST  TECHNIQUE: Multiplanar, multiecho pulse sequences of the brain and surrounding structures were obtained without intravenous contrast. Angiographic images of the head were obtained using MRA technique without contrast.  COMPARISON:  Head CTs 01/10/2015 and earlier.  FINDINGS: MRI HEAD FINDINGS  Cerebral volume is normal. No restricted diffusion to suggest acute infarction. No midline shift, mass effect, evidence of mass lesion, ventriculomegaly,  extra-axial collection or acute intracranial hemorrhage. Cervicomedullary junction and pituitary are within normal limits. Major intracranial vascular flow voids are within normal limits. [name] and white matter signal is within normal limits throughout the brain.  Visible internal auditory structures appear normal. Mild right mastoid effusion is chronic. Right greater than left maxillary sinus mucosal thickening, in part due to mucous retention cyst. Mild to moderate ethmoid mucosal thickening.  Orbit and scalp soft tissues appear normal. Negative visualized cervical spine.  MRA HEAD FINDINGS  Antegrade flow in the posterior circulation with codominant distal vertebral arteries. Normal left PICA origin. Dominant right AICA. No basilar stenosis. SCA and PCA origins are normal. Diminutive posterior communicating arteries, more so the right. Bilateral PCA branches are within normal limits.  Antegrade flow in both ICA siphons. Artifact at the skullbase related to petrous apex air cells. No siphon stenosis. Ophthalmic and posterior communicating artery origins are normal. Normal carotid termini, MCA and ACA origins. Diminutive or absent anterior communicating artery. Visualized bilateral ACA branches are within normal limits. Bilateral MCA M1 segments (left lenticulostriate infundibulum) and MCA bifurcations are within normal limits. Visualized bilateral MCA branches are within normal limits.  IMPRESSION: 1. Normal noncontrast MRI appearance of the brain. Negative intracranial MRA. 2. Mild chronic right mastoid effusion. Mild to moderate maxillary and ethmoid sinus  inflammatory changes.   Electronically Signed   By: Genevie Ann M.D.   On: 01/10/2015 12:43   Mr Brain Wo Contrast  01/10/2015   CLINICAL DATA:  39 year old female with dizziness and weakness for 1 day. Fell at home in bedroom. Initial encounter.  EXAM: MRI HEAD WITHOUT CONTRAST  MRA HEAD WITHOUT CONTRAST  TECHNIQUE: Multiplanar, multiecho pulse sequences of the  brain and surrounding structures were obtained without intravenous contrast. Angiographic images of the head were obtained using MRA technique without contrast.  COMPARISON:  Head CTs 01/10/2015 and earlier.  FINDINGS: MRI HEAD FINDINGS  Cerebral volume is normal. No restricted diffusion to suggest acute infarction. No midline shift, mass effect, evidence of mass lesion, ventriculomegaly, extra-axial collection or acute intracranial hemorrhage. Cervicomedullary junction and pituitary are within normal limits. Major intracranial vascular flow voids are within normal limits. [name] and white matter signal is within normal limits throughout the brain.  Visible internal auditory structures appear normal. Mild right mastoid effusion is chronic. Right greater than left maxillary sinus mucosal thickening, in part due to mucous retention cyst. Mild to moderate ethmoid mucosal thickening.  Orbit and scalp soft tissues appear normal. Negative visualized cervical spine.  MRA HEAD FINDINGS  Antegrade flow in the posterior circulation with codominant distal vertebral arteries. Normal left PICA origin. Dominant right AICA. No basilar stenosis. SCA and PCA origins are normal. Diminutive posterior communicating arteries, more so the right. Bilateral PCA branches are within normal limits.  Antegrade flow in both ICA siphons. Artifact at the skullbase related to petrous apex air cells. No siphon stenosis. Ophthalmic and posterior communicating artery origins are normal. Normal carotid termini, MCA and ACA origins. Diminutive or absent anterior communicating artery. Visualized bilateral ACA branches are within normal limits. Bilateral MCA M1 segments (left lenticulostriate infundibulum) and MCA bifurcations are within normal limits. Visualized bilateral MCA branches are within normal limits.  IMPRESSION: 1. Normal noncontrast MRI appearance of the brain. Negative intracranial MRA. 2. Mild chronic right mastoid effusion. Mild to moderate  maxillary and ethmoid sinus inflammatory changes.   Electronically Signed   By: Genevie Ann M.D.   On: 01/10/2015 12:43   Dg Swallowing Func-speech Pathology  01/11/2015   Ephraim Hamburger, CCC-SLP     01/11/2015  9:58 PM  Objective Swallowing Evaluation: Other (Comment) (MBSS)  Patient Details  Name: Maria Vazquez MRN: [id number] Date of Birth: [birth year]  Today's Date: 01/11/2015 Time: SLP Start Time (ACUTE ONLY): 1148-SLP Stop Time (ACUTE  ONLY): 1213 SLP Time Calculation (min) (ACUTE ONLY): 25 min  Past Medical History:  Past Medical History  Diagnosis Date  . Gallstone pancreatitis Feb 2011  . Cervix carcinoma in situ Mar 2011  . Proteinuria - cause not known   . Hypertension   . Lupus   . CHF (congestive heart failure)   . Anemia   . Thrombocytopenia   . Blood transfusion   . Dialysis patient     tues, thursday, saturday.  . Renal failure, acute on chronic   . Chronic kidney disease     T, Th, Sat  . Lupus nephritis   . Renal insufficiency   . Seizure disorder 03/19/2013  . Pneumonia due to organism 03/02/2011   Past Surgical History:  Past Surgical History  Procedure Laterality Date  . Appendectomy    . Cervical cone biopsy    . Cesarean section    . Cholecystectomy    . Embolectomy  10/10/2011    Procedure: EMBOLECTOMY;  Surgeon: Angelia Mould, MD;  Location: [facility] OR;  Service: Vascular;  Laterality: Right;  . Av fistula placement    . Av fistula placement  07/18/2011    Procedure: ARTERIOVENOUS (AV) FISTULA CREATION;  Surgeon:  Angelia Mould, MD;  Location: [facility];  Service: Vascular;   Laterality: Right;  . Av fistula placement  01/23/12    Revision of Right AVF  . Shuntogram Right 07/07/2011    Procedure: SHUNTOGRAM;  Surgeon: Angelia Mould, MD;   Location: [facility] CATH LAB;  Service: Cardiovascular;  Laterality:  Right;  . Shuntogram N/A 12/08/2011    Procedure: Earney Mallet;  Surgeon: Angelia Mould, MD;   Location: [facility] CATH LAB;  Service: Cardiovascular;  Laterality:  N/A;  .  Fistulogram Right 04/12/2012    Procedure: FISTULOGRAM;  Surgeon: Angelia Mould, MD;   Location: [facility] CATH LAB;  Service: Cardiovascular;  Laterality:  Right;   HPI:  Other Pertinent Information: Nakiesha Dahman is a 39  y.o. female who presented with complaints of headache and  dizziness onset 01/09/15. The dizziness is gradually worsening and  increases when standing, no reports of any alleviating factors.  She reports blurry vision but also feels this maybe a chronic  issue. She also reported nausea with vomiting x2 a day for the  past week, mild abdominal pain, generalized weakness, and walking  is made difficult due to dizziness. Next dialysis is scheduled  01/11/15.  No Data Recorded  Assessment / Plan / Recommendation CHL IP CLINICAL IMPRESSIONS 01/11/2015  Therapy Diagnosis WFL;Mild oral phase dysphagia  Clinical Impression Pt seen upright in Hausted chair for MBSS. Pt  reportedly had difficulty over night with secretions, however  reports no difficulty today. She has been consuming puree diet  with thin liquids with good tolerance. Objective test reveals  mild oral phase dysphagia and WNL pharyngeal and  cervico-esophageal phase characterized by weak lingual  manipulation resulting in inefficient anterior posterior transit  of solid bolus and piecemeal deglutition with solids; pt with  mild premature spillage with straw sips thin liquid and  sequential large sips thin, however no penetration/aspiration  observed. Barium tablet was briefly delayed in mid esophagus  before clearance to stomach. Pt with presumed mild lingual  weakness and slow, sluggish movement with bolus manipulation. She  reports decreased sensation/numbness on right side (yesterday  reported left side?). While pt appears safe to tolerate regular  textures, she does appear to have element of fatigue and/or  fluctuation with strength and stamina so will initiate D3/mech  soft diet with thin liquids for energy conservation  purposes. SLP  will follow for diet tolerance. Above to Dr. Roderic Palau and RN, Jacqlyn Larsen.  Pt in agreement with POC.       CHL IP TREATMENT RECOMMENDATION 01/11/2015  Treatment Recommendations Therapy as outlined in treatment plan  below     CHL IP DIET RECOMMENDATION 01/11/2015  SLP Diet Recommendations Dysphagia 3 (Mech soft);Thin  Liquid Administration via (None)  Medication Administration Whole meds with liquid  Compensations (None)  Postural Changes and/or Swallow Maneuvers (None)     CHL IP OTHER RECOMMENDATIONS 01/11/2015  Recommended Consults (None)  Oral Care Recommendations Oral care BID  Other Recommendations Clarify dietary restrictions     No flowsheet data found.   CHL IP FREQUENCY AND DURATION 01/11/2015  Speech Therapy Frequency (ACUTE ONLY) min 2x/week  Treatment Duration 1 week     Pertinent Vitals/Pain VSS    CHL IP REASON FOR REFERRAL 01/11/2015  Reason for Referral Objectively evaluate swallowing function  CHL IP GO 01/11/2015  Functional Assessment Tool Used clinical judgement; MBSS  Functional Limitations Swallowing  Swallow Current Status [id number]) CJ  Swallow Goal Status [id number]) CI  Swallow Discharge Status [phone]) (None)                                                                   Thank you,  Genene Churn, [place]        [place] 01/11/2015, 9:54 PM     Scheduled Meds: . aspirin  300 mg Rectal Daily   Or  . aspirin  325 mg Oral Daily  . azaTHIOprine  25 mg Oral Daily  . calcium acetate  2,001 mg Oral TID WC  . calcium carbonate  1 tablet Oral Daily  . cholecalciferol  1,000 Units Oral Daily  . enoxaparin (LOVENOX) injection  30 mg Subcutaneous Q24H  . hydroxychloroquine  200 mg Oral Daily  . levETIRAcetam  250 mg Oral Q T,Th,Sat-1800  . levETIRAcetam  500 mg Oral BID  . meclizine  25 mg Oral TID   Continuous Infusions:   Active Problems:   Anemia in ESRD (end-stage renal disease)   Lupus (systemic lupus erythematosus)   Seizure disorder   ESRD (end  stage renal disease) on dialysis   Vertigo   HTN (hypertension)   Hyperkalemia   Dizziness    Time spent: 30 minutes    Kathie Dike, M.D. [facility] Pager [phone]. If 7PM-7AM, please contact night-coverage at [email or web address], password [facility] 01/12/2015, 8:29 AM      I, Rhett Bannister, acting a scribe, recorded this note contemporaneously in the presence of Dr. Kathie Dike, M.D. on 01/12/2015 at 10:25 AM   I have reviewed the above documentation for accuracy and completeness, and I agree with the above.  Kathie Dike, MD

## 2015-01-12 NOTE — Progress Notes (Signed)
Speech Language Pathology Treatment: Dysphagia  Patient Details Name: Maria Vazquez MRN: [id number] DOB: 07/29/1975 Today's Date: 01/12/2015 Time: 0104-0459 SLP Time Calculation (min) (ACUTE ONLY): 18 min  Assessment / Plan / Recommendation Clinical Impression  Pt observed with regular, mechanical soft, and thin liquid consistencies for toleration of diet since upgrade following MBS yesterday. Pt denies difficulty since upgrade and nursing also reports no observed swallow deficits. Mild oral dysphagia still noted with decreased strength and coordination which appears not to affect protection of patients airway. No further needs identified at this time, ST to sign off.      HPI Other Pertinent Information: Maria Vazquez is a 39 y.o. female who presented with complaints of headache and dizziness onset 01/09/15. The dizziness is gradually worsening and increases when standing, no reports of any alleviating factors. She reports blurry vision but also feels this maybe a chronic issue. She also reported nausea with vomiting x2 a day for the past week, mild abdominal pain, generalized weakness, and walking is made difficult due to dizziness. Next dialysis is scheduled 01/11/15.   Pertinent Vitals Pain Assessment: No/denies pain  SLP Plan  All goals met    Recommendations Diet recommendations: Dysphagia 3 (mechanical soft);Thin liquid Liquids provided via: Cup Medication Administration: Whole meds with liquid Supervision: Patient able to self feed;Intermittent supervision to cue for compensatory strategies Compensations: Slow rate;Small sips/bites;Follow solids with liquid Postural Changes and/or Swallow Maneuvers: Seated upright 90 degrees              Oral Care Recommendations: Oral care BID Follow up Recommendations: None Plan: All goals met    GO Functional Assessment Tool Used: diet texture analysis Functional Limitations: Swallowing Swallow Current Status ([id number]):  At least 1 percent but less than 20 percent impaired, limited or restricted Swallow Goal Status [phone]): At least 1 percent but less than 20 percent impaired, limited or restricted Swallow Discharge Status [phone]): At least 1 percent but less than 20 percent impaired, limited or restricted   Arvil Chaco MA, [place] Pathologist    Levi Aland 01/12/2015, 3:04 PM

## 2015-01-12 NOTE — Progress Notes (Signed)
Utilization review completed.  

## 2015-01-12 NOTE — Progress Notes (Signed)
Subjective: Patient feels much better  And offers no complaint  Objective: Vital signs in last 24 hours: Temp:  [98 F (36.7 C)-99.7 F (37.6 C)] 98.4 F (36.9 C) (08/26 0730) Pulse Rate:  [60-84] 64 (08/26 0730) Resp:  [16-20] 18 (08/26 0730) BP: (77-117)/(49-79) 80/66 mmHg (08/26 0812) SpO2:  [97 %-100 %] 100 % (08/26 0730) Weight:  [169 lb 6.4 oz (76.839 kg)] 169 lb 6.4 oz (76.839 kg) (08/26 0514)  Intake/Output from previous day: 08/25 0701 - 08/26 0700 In: -  Out: 2272  Intake/Output this shift:     Recent Labs  01/10/15 0440 01/11/15 0428 01/12/15 0503  HGB 11.3* 10.4* 11.1*    Recent Labs  01/11/15 0428 01/12/15 0503  WBC 4.1 5.5  RBC 3.39* 3.59*  HCT 31.9* 34.1*  PLT 144* 140*    Recent Labs  01/11/15 0428 01/12/15 0459  NA 136 136  K 5.9* 5.5*  CL 95* 95*  CO2 25 29  BUN 52* 31*  CREATININE 9.76* 6.32*  GLUCOSE 78 88  CALCIUM 8.2* 8.6*    Recent Labs  01/10/15 0440  INR 1.26    Generally patient is alert in no apparent distress. Chest is clear to auscultation Heart exam reveals regular rate and rhythm Extremities no edema   Assessment/Plan: Problem #1 end-stage renal disease she is status post hemodialysis on yesterday and asymptoatic Problem #2 anemia: Her hemoglobin is within target goal Problem #3 history of hyperkalemia: She is s/p dialysis and her potassium has improved but still high normal Problem #4 history of dizziness: Feeling better. This seems to be recurrent problem. She has history of vertigo also. Problem #5 history of seizure disorder Problem #6 metabolic bone disease: Calcium is range. Problem #7 history of lupus Plan: We'll make arrangements for patient to get dialysis tomorrow We'll use 1 K/2.5 calcium bath for 4 hours tommorow We'll try to remove about 21/2 L if her blood pressure tolerates Low potassium diet   Maria Vazquez S 01/12/2015, 8:18 AM

## 2015-01-12 NOTE — Clinical Documentation Improvement (Signed)
Internal Medicine  Can the diagnosis of CHF be further specified?    Acuity - Acute, Chronic, Acute on Chronic   Type - Systolic, Diastolic, Systolic and Diastolic  Other  Clinically Undetermined   Document any associated diagnoses/conditions   Supporting Information: 01/10/15:  ECHO- EF= 55-60%   Please exercise your independent, professional judgment when responding. A specific answer is not anticipated or expected.   Thank You,  [place]

## 2015-01-13 LAB — BASIC METABOLIC PANEL
ANION GAP: 15 (ref 5–15)
BUN: 61 mg/dL — ABNORMAL HIGH (ref 6–20)
CALCIUM: 8.6 mg/dL — AB (ref 8.9–10.3)
CO2: 28 mmol/L (ref 22–32)
Chloride: 93 mmol/L — ABNORMAL LOW (ref 101–111)
Creatinine, Ser: 9.37 mg/dL — ABNORMAL HIGH (ref 0.44–1.00)
GFR calc Af Amer: 6 mL/min — ABNORMAL LOW (ref >60)
GFR, EST NON AFRICAN AMERICAN: 5 mL/min — AB (ref >60)
GLUCOSE: 82 mg/dL (ref 65–99)
POTASSIUM: 5.9 mmol/L — AB (ref 3.5–5.1)
SODIUM: 136 mmol/L (ref 135–145)

## 2015-01-13 LAB — GLUCOSE, CAPILLARY: Glucose-Capillary: 86 mg/dL (ref 65–99)

## 2015-01-13 LAB — CORTISOL-AM, BLOOD: CORTISOL - AM: 4.9 ug/dL — AB (ref 6.7–22.6)

## 2015-01-13 MED ORDER — HYDROXYCHLOROQUINE SULFATE 200 MG PO TABS: 200.0000 mg | ORAL_TABLET | Freq: Every day | ORAL | Status: AC

## 2015-01-13 MED ORDER — AZATHIOPRINE 50 MG PO TABS: 25.0000 mg | ORAL_TABLET | Freq: Every day | ORAL | Status: DC

## 2015-01-13 MED ORDER — SODIUM CHLORIDE 0.9 % IV SOLN: 100.0000 mL | INTRAVENOUS | Status: DC | PRN

## 2015-01-13 MED ORDER — HEPARIN SODIUM (PORCINE) 1000 UNIT/ML IJ SOLN
INTRAMUSCULAR | Status: AC
  Administered 2015-01-13: 09:00:00
  Filled 2015-01-13: qty 2

## 2015-01-13 MED ORDER — HEPARIN SODIUM (PORCINE) 1000 UNIT/ML DIALYSIS: 20.0000 [IU]/kg | INTRAMUSCULAR | Status: DC | PRN

## 2015-01-13 MED ORDER — MIDODRINE HCL 5 MG PO TABS: 5.0000 mg | ORAL_TABLET | Freq: Three times a day (TID) | ORAL | Status: DC

## 2015-01-13 MED ORDER — MECLIZINE HCL 25 MG PO TABS: 25.0000 mg | ORAL_TABLET | Freq: Three times a day (TID) | ORAL | Status: DC | PRN

## 2015-01-13 NOTE — Progress Notes (Signed)
Maria Vazquez to be D/C'd Home per MD order.  Discussed prescriptions and follow up appointments with the patient. Prescriptions given to patient, medication list explained in detail. Pt verbalized understanding.    Medication List    TAKE these medications        azaTHIOprine 50 MG tablet  Commonly known as:  IMURAN  Take 0.5 tablets (25 mg total) by mouth daily.     calcium acetate 667 MG capsule  Commonly known as:  PHOSLO  Take 667-1,334 mg by mouth 3 (three) times daily with meals. Patient takes 3 tablets with meals and 1 tablet with snack     calcium carbonate 750 MG chewable tablet  Commonly known as:  TUMS EX  Chew 1 tablet by mouth daily.     cholecalciferol 1000 UNITS tablet  Commonly known as:  VITAMIN D  Take 1,000 Units by mouth daily.     hydroxychloroquine 200 MG tablet  Commonly known as:  PLAQUENIL  Take 1 tablet (200 mg total) by mouth daily.     levETIRAcetam 250 MG tablet  Commonly known as:  KEPPRA  Take 250 mg by mouth every Tuesday, Thursday, and Saturday at 6 PM. ON DIALYSIS DAYS     levETIRAcetam 500 MG tablet  Commonly known as:  KEPPRA  Take 500 mg by mouth 2 (two) times daily.     lidocaine-prilocaine cream  Commonly known as:  EMLA  Apply 1 application topically as needed (before dialysis).     meclizine 25 MG tablet  Commonly known as:  ANTIVERT  Take 1 tablet (25 mg total) by mouth 3 (three) times daily as needed for dizziness.     midodrine 5 MG tablet  Commonly known as:  PROAMATINE  Take 1 tablet (5 mg total) by mouth 3 (three) times daily with meals.        Filed Vitals:   01/13/15 1424  BP: 88/57  Pulse: 72  Temp: 98.2 F (36.8 C)  Resp: 16    Skin clean, dry and intact without evidence of skin break down, no evidence of skin tears noted. IV catheter discontinued intact. Site without signs and symptoms of complications. Dressing and pressure applied. Pt denies pain at this time. No complaints noted. A Spanish  telephone interpreter was used when going over the discharge instructions.  An After Visit Summary was printed and given to the patient in [place]. Education materials were also included on low sodium diets. Prescriptions were also given and explained to the patient Patient escorted via [place], and D/C home via private auto.  [name] BSN, RN

## 2015-01-13 NOTE — Discharge Summary (Signed)
Physician Discharge Summary  Maria Vazquez [id number] DOB: [birth year] DOA: 01/10/2015  PCP: Estanislado Emms, MD  Admit date: 01/10/2015 Discharge date: 01/13/2015  Time spent: 35 minutes  Recommendations for Outpatient Follow-up:  1. Follow up at dialysis center 8/30 as previously scheduled. 2. Follow up serum cortisol and anticholinesterase antibodies.  3. Patient started on Midodrine for hypotension.  4. Follow up with PCP as scheduled. 5. Follow up with rheumatologist at [facility] in two weeks as scheduled.  Discharge Diagnoses:  Active Problems:   Anemia in ESRD (end-stage renal disease)   Lupus (systemic lupus erythematosus)   Seizure disorder   ESRD (end stage renal disease) on dialysis   Vertigo   HTN (hypertension)   Hyperkalemia   Dizziness   Discharge Condition: Improved   Diet recommendation: heart healthy   Filed Weights   01/11/15 0450 01/12/15 0514 01/13/15 0710  Weight: 78.245 kg (172 lb 8 oz) 76.839 kg (169 lb 6.4 oz) 78.155 kg (172 lb 4.8 oz)    History of present illness:  39 y.o. female who presented with complaints of headache and dizziness onset 01/09/15. The dizziness is gradually worsened and increased when standing, no reports of any alleviating factors. She reported blurry vision but also felt that it may have been chronic issue. She also reported nausea with vomiting x2 a day for the past week, mild abdominal pain, generalized weakness, and walking is made difficult due to dizziness. Next dialysis is scheduled 01/11/15. She denied double vision, chest pain, sob different from chronic, dysuria, rash, sores on skin, numbness, or difficulty eating. She is anuric and denies any fever, no cough or worsening SOB, no new medications.  She was evaluated in the ED where CT Head was found to be negative. There was concern for possible posterior circulation stroke, and she was referred for admission.   Hospital Course:  Dizziness improved with  improvement of hypotension. CT Head and MRI Brain were unremarkable, while ECHO revealed estimated ejection fraction was in the range of 55% to 60%. Although MRI imaging showed evidence of sinus disease, Neurology evaluated the patient and believed that dizziness was more orthostatics induced.  She continued on ASA regimen and monitored on telemetry during admission. At the time of discharge she reports that her symptoms have significantly improved.  Dysphagia, also appears to have improved during her stay. Etiology is not entirely clear. Evaluation by speech therapy revealed mild oral phase dysphagia with questionable pharyngeal dysphagia. Objective assessment of Barium Swallow objective test revealed mild oral phase dysphagia and WNL pharyngeal and cervico-esophageal phase characterized by weak lingual manipulation resulting in inefficient anterior posterior transit of solid bolus and piecemeal deglutition with solids. Since the patient already has an autoimmune condition, there was concern for possible myasthenia gravis. Acetylcholinesterase antibodies were sent and will need to be followed up as an outpatient. She reports that her symptoms have improved on discharge.   Hyperkalemia. Received 1 dose of calcium gluconate on admission since she had peaked t waves. One dose of Kayexalate given on 8/24. Potassium remained elevated through her admission. She underwent dialysis with a 1K bath which should correct hyperkalemia. She was advised to observe a low potassium diet.   ESRD. Patient underwent dialysis as per scheduled. Nephrology consulted, agrees with current care plan.   Normocytic anemia. Hgb is 11.1 today and stable.There is no evidence of bleeding.   Lupus. Will continue Plaquenil and Imuran. Follow up at [facility].   History of seizures. Was continued on Keppra.  Procedures:  ECHO -Left ventricle: The cavity size was normal. Wall thickness was normal. Systolic function was  normal. The estimated ejection fraction was in the range of 55% to 60%. Wall motion was normal; there were no regional wall motion abnormalities. Left ventricular diastolic function parameters were normal for the patient&'s age. - Right atrium: Central venous pressure (est): 3 mm Hg. - Atrial septum: No defect or patent foramen ovale was identified. - Tricuspid valve: There was mild regurgitation. - Pulmonary arteries: PA peak pressure: 28 mm Hg (S). - Pericardium, extracardiac: There was no pericardial effusion.  Hemodialysis  Consultations:  Nephrology  Speech and Language Pathologist  Neurology  Discharge Exam: Filed Vitals:   01/13/15 0710  BP: 101/61  Pulse: 68  Temp: 97.9 F (36.6 C)  Resp: 16     General: Appears calm and comfortable  Cardiovascular: RRR, no m/r/g. No LE edema.  Respiratory: CTA bilaterally, no w/r/r. Normal respiratory effort.  Abdomen: soft, ntnd  Musculoskeletal: grossly normal tone BUE/BLE  Psychiatric: grossly normal mood and affect, speech fluent and appropriate  Neurologic: grossly non-focal.  Discharge Instructions    Current Discharge Medication List    CONTINUE these medications which have NOT CHANGED   Details  azaTHIOprine (IMURAN) 50 MG tablet Take 25 mg by mouth daily.    calcium acetate (PHOSLO) 667 MG capsule Take 667-1,334 mg by mouth 3 (three) times daily with meals. Patient takes 3 tablets with meals and 1 tablet with snack    calcium carbonate (TUMS EX) 750 MG chewable tablet Chew 1 tablet by mouth daily.     cholecalciferol (VITAMIN D) 1000 UNITS tablet Take 1,000 Units by mouth daily.    hydroxychloroquine (PLAQUENIL) 200 MG tablet Take 200 mg by mouth daily.     !! levETIRAcetam (KEPPRA) 250 MG tablet Take 250 mg by mouth every Tuesday, Thursday, and Saturday at 6 PM. ON DIALYSIS DAYS    !! levETIRAcetam (KEPPRA) 500 MG tablet Take 500 mg by mouth 2 (two) times daily.    lidocaine-prilocaine  (EMLA) cream Apply 1 application topically as needed (before dialysis).     !! - Potential duplicate medications found. Please discuss with provider.     No Known Allergies    The results of significant diagnostics from this hospitalization (including imaging, microbiology, ancillary and laboratory) are listed below for reference.    Significant Diagnostic Studies: Ct Head Wo Contrast  01/10/2015   CLINICAL DATA:  Dizziness, headache and nausea beginning yesterday. Weakness. History of lupus, cervical cancer, seizure disorder, on dialysis.  EXAM: CT HEAD WITHOUT CONTRAST  TECHNIQUE: Contiguous axial images were obtained from the base of the skull through the vertex without intravenous contrast.  COMPARISON:  CT head February 07, 2014  FINDINGS: The ventricles and sulci are normal. No intraparenchymal hemorrhage, mass effect nor midline shift. No acute large vascular territory infarcts. Cerebellar tonsils at but not below the foramen magnum.  No abnormal extra-axial fluid collections. Basal cisterns are patent. Minimal calcific atherosclerosis the carotid siphons.  No skull fracture. The included ocular globes and orbital contents are non-suspicious. Mild ethmoid mucosal thickening without paranasal sinus air-fluid levels. The mastoid air cells are well aerated.  IMPRESSION: No acute intracranial process; normal noncontrast CT head.   Electronically Signed   By: Elon Alas M.D.   On: 01/10/2015 05:29   Mr Jodene Nam Head Wo Contrast  01/10/2015   CLINICAL DATA:  39 year old female with dizziness and weakness for 1 day. Fell at home in bedroom. Initial encounter.  EXAM: MRI HEAD WITHOUT CONTRAST  MRA HEAD WITHOUT CONTRAST  TECHNIQUE: Multiplanar, multiecho pulse sequences of the brain and surrounding structures were obtained without intravenous contrast. Angiographic images of the head were obtained using MRA technique without contrast.  COMPARISON:  Head CTs 01/10/2015 and earlier.  FINDINGS: MRI  HEAD FINDINGS  Cerebral volume is normal. No restricted diffusion to suggest acute infarction. No midline shift, mass effect, evidence of mass lesion, ventriculomegaly, extra-axial collection or acute intracranial hemorrhage. Cervicomedullary junction and pituitary are within normal limits. Major intracranial vascular flow voids are within normal limits. [name] and white matter signal is within normal limits throughout the brain.  Visible internal auditory structures appear normal. Mild right mastoid effusion is chronic. Right greater than left maxillary sinus mucosal thickening, in part due to mucous retention cyst. Mild to moderate ethmoid mucosal thickening.  Orbit and scalp soft tissues appear normal. Negative visualized cervical spine.  MRA HEAD FINDINGS  Antegrade flow in the posterior circulation with codominant distal vertebral arteries. Normal left PICA origin. Dominant right AICA. No basilar stenosis. SCA and PCA origins are normal. Diminutive posterior communicating arteries, more so the right. Bilateral PCA branches are within normal limits.  Antegrade flow in both ICA siphons. Artifact at the skullbase related to petrous apex air cells. No siphon stenosis. Ophthalmic and posterior communicating artery origins are normal. Normal carotid termini, MCA and ACA origins. Diminutive or absent anterior communicating artery. Visualized bilateral ACA branches are within normal limits. Bilateral MCA M1 segments (left lenticulostriate infundibulum) and MCA bifurcations are within normal limits. Visualized bilateral MCA branches are within normal limits.  IMPRESSION: 1. Normal noncontrast MRI appearance of the brain. Negative intracranial MRA. 2. Mild chronic right mastoid effusion. Mild to moderate maxillary and ethmoid sinus inflammatory changes.   Electronically Signed   By: Genevie Ann M.D.   On: 01/10/2015 12:43   Mr Brain Wo Contrast  01/10/2015   CLINICAL DATA:  39 year old female with dizziness and weakness for  1 day. Fell at home in bedroom. Initial encounter.  EXAM: MRI HEAD WITHOUT CONTRAST  MRA HEAD WITHOUT CONTRAST  TECHNIQUE: Multiplanar, multiecho pulse sequences of the brain and surrounding structures were obtained without intravenous contrast. Angiographic images of the head were obtained using MRA technique without contrast.  COMPARISON:  Head CTs 01/10/2015 and earlier.  FINDINGS: MRI HEAD FINDINGS  Cerebral volume is normal. No restricted diffusion to suggest acute infarction. No midline shift, mass effect, evidence of mass lesion, ventriculomegaly, extra-axial collection or acute intracranial hemorrhage. Cervicomedullary junction and pituitary are within normal limits. Major intracranial vascular flow voids are within normal limits. [name] and white matter signal is within normal limits throughout the brain.  Visible internal auditory structures appear normal. Mild right mastoid effusion is chronic. Right greater than left maxillary sinus mucosal thickening, in part due to mucous retention cyst. Mild to moderate ethmoid mucosal thickening.  Orbit and scalp soft tissues appear normal. Negative visualized cervical spine.  MRA HEAD FINDINGS  Antegrade flow in the posterior circulation with codominant distal vertebral arteries. Normal left PICA origin. Dominant right AICA. No basilar stenosis. SCA and PCA origins are normal. Diminutive posterior communicating arteries, more so the right. Bilateral PCA branches are within normal limits.  Antegrade flow in both ICA siphons. Artifact at the skullbase related to petrous apex air cells. No siphon stenosis. Ophthalmic and posterior communicating artery origins are normal. Normal carotid termini, MCA and ACA origins. Diminutive or absent anterior communicating artery. Visualized bilateral ACA branches are within normal limits.  Bilateral MCA M1 segments (left lenticulostriate infundibulum) and MCA bifurcations are within normal limits. Visualized bilateral MCA branches are  within normal limits.  IMPRESSION: 1. Normal noncontrast MRI appearance of the brain. Negative intracranial MRA. 2. Mild chronic right mastoid effusion. Mild to moderate maxillary and ethmoid sinus inflammatory changes.   Electronically Signed   By: Genevie Ann M.D.   On: 01/10/2015 12:43   Dg Chest Portable 1 View  01/10/2015   CLINICAL DATA:  Weakness this morning.  EXAM: PORTABLE CHEST - 1 VIEW  COMPARISON:  01/05/2014  FINDINGS: The cardiomediastinal contours are normal. The lungs are clear. Pulmonary vasculature is normal. No consolidation, pleural effusion, or pneumothorax. No acute osseous abnormalities are seen. Multiple overlying monitoring devices in place.  IMPRESSION: No acute pulmonary process.   Electronically Signed   By: Jeb Levering M.D.   On: 01/10/2015 06:45   Dg Swallowing Func-speech Pathology  01/11/2015   Ephraim Hamburger, CCC-SLP     01/11/2015  9:58 PM  Objective Swallowing Evaluation: Other (Comment) (MBSS)  Patient Details  Name: Maria Vazquez MRN: [id number] Date of Birth: [birth year]  Today's Date: 01/11/2015 Time: SLP Start Time (ACUTE ONLY): 1148-SLP Stop Time (ACUTE  ONLY): 1213 SLP Time Calculation (min) (ACUTE ONLY): 25 min  Past Medical History:  Past Medical History  Diagnosis Date  . Gallstone pancreatitis Feb 2011  . Cervix carcinoma in situ Mar 2011  . Proteinuria - cause not known   . Hypertension   . Lupus   . CHF (congestive heart failure)   . Anemia   . Thrombocytopenia   . Blood transfusion   . Dialysis patient     tues, thursday, saturday.  . Renal failure, acute on chronic   . Chronic kidney disease     T, Th, Sat  . Lupus nephritis   . Renal insufficiency   . Seizure disorder 03/19/2013  . Pneumonia due to organism 03/02/2011   Past Surgical History:  Past Surgical History  Procedure Laterality Date  . Appendectomy    . Cervical cone biopsy    . Cesarean section    . Cholecystectomy    . Embolectomy  10/10/2011    Procedure: EMBOLECTOMY;  Surgeon: Angelia Mould, MD;   Location: [place];  Service: Vascular;  Laterality: Right;  . Av fistula placement    . Av fistula placement  07/18/2011    Procedure: ARTERIOVENOUS (AV) FISTULA CREATION;  Surgeon:  Angelia Mould, MD;  Location: [place];  Service: Vascular;   Laterality: Right;  . Av fistula placement  01/23/12    Revision of Right AVF  . Shuntogram Right 07/07/2011    Procedure: SHUNTOGRAM;  Surgeon: Angelia Mould, MD;   Location: [facility] CATH LAB;  Service: Cardiovascular;  Laterality:  Right;  . Shuntogram N/A 12/08/2011    Procedure: Earney Mallet;  Surgeon: Angelia Mould, MD;   Location: [facility] CATH LAB;  Service: Cardiovascular;  Laterality:  N/A;  . Fistulogram Right 04/12/2012    Procedure: FISTULOGRAM;  Surgeon: Angelia Mould, MD;   Location: [facility] CATH LAB;  Service: Cardiovascular;  Laterality:  Right;   HPI:  Other Pertinent Information: Maria Vazquez is a 39  y.o. female who presented with complaints of headache and  dizziness onset 01/09/15. The dizziness is gradually worsening and  increases when standing, no reports of any alleviating factors.  She reports blurry vision but also feels this maybe a chronic  issue. She also reported nausea with vomiting x2 a  day for the  past week, mild abdominal pain, generalized weakness, and walking  is made difficult due to dizziness. Next dialysis is scheduled  01/11/15.  No Data Recorded  Assessment / Plan / Recommendation CHL IP CLINICAL IMPRESSIONS 01/11/2015  Therapy Diagnosis WFL;Mild oral phase dysphagia  Clinical Impression Pt seen upright in Hausted chair for MBSS. Pt  reportedly had difficulty over night with secretions, however  reports no difficulty today. She has been consuming puree diet  with thin liquids with good tolerance. Objective test reveals  mild oral phase dysphagia and WNL pharyngeal and  cervico-esophageal phase characterized by weak lingual  manipulation resulting in inefficient anterior posterior transit  of solid bolus and  piecemeal deglutition with solids; pt with  mild premature spillage with straw sips thin liquid and  sequential large sips thin, however no penetration/aspiration  observed. Barium tablet was briefly delayed in mid esophagus  before clearance to stomach. Pt with presumed mild lingual  weakness and slow, sluggish movement with bolus manipulation. She  reports decreased sensation/numbness on right side (yesterday  reported left side?). While pt appears safe to tolerate regular  textures, she does appear to have element of fatigue and/or  fluctuation with strength and stamina so will initiate D3/mech  soft diet with thin liquids for energy conservation purposes. SLP  will follow for diet tolerance. Above to Dr. Roderic Palau and RN, Jacqlyn Larsen.  Pt in agreement with POC.       CHL IP TREATMENT RECOMMENDATION 01/11/2015  Treatment Recommendations Therapy as outlined in treatment plan  below     CHL IP DIET RECOMMENDATION 01/11/2015  SLP Diet Recommendations Dysphagia 3 (Mech soft);Thin  Liquid Administration via (None)  Medication Administration Whole meds with liquid  Compensations (None)  Postural Changes and/or Swallow Maneuvers (None)     CHL IP OTHER RECOMMENDATIONS 01/11/2015  Recommended Consults (None)  Oral Care Recommendations Oral care BID  Other Recommendations Clarify dietary restrictions     No flowsheet data found.   CHL IP FREQUENCY AND DURATION 01/11/2015  Speech Therapy Frequency (ACUTE ONLY) min 2x/week  Treatment Duration 1 week     Pertinent Vitals/Pain VSS    CHL IP REASON FOR REFERRAL 01/11/2015  Reason for Referral Objectively evaluate swallowing function              CHL IP GO 01/11/2015  Functional Assessment Tool Used clinical judgement; MBSS  Functional Limitations Swallowing  Swallow Current Status [id number]) CJ  Swallow Goal Status [id number]) CI  Swallow Discharge Status [phone]) (None)                                                                   Thank you,  Genene Churn, [place]         [place] 01/11/2015, 9:54 PM     Microbiology: No results found for this or any previous visit (from the past 240 hour(s)).   Labs: Basic Metabolic Panel:  Recent Labs Lab 01/10/15 0440 01/11/15 0428 01/12/15 0459 01/13/15 0610  NA 135 136 136 136  K 5.4* 5.9* 5.5* 5.9*  CL 95* 95* 95* 93*  CO2 28 25 29 28   GLUCOSE 89 78 88 82  BUN 37* 52* 31* 61*  CREATININE 7.10* 9.76* 6.32* 9.37*  CALCIUM 9.2 8.2* 8.6*  8.6*   Liver Function Tests:  Recent Labs Lab 01/10/15 0440  AST 19  ALT 11*  ALKPHOS 87  BILITOT 0.7  PROT 7.6  ALBUMIN 4.1   No results for input(s): LIPASE, AMYLASE in the last 168 hours. No results for input(s): AMMONIA in the last 168 hours. CBC:  Recent Labs Lab 01/10/15 0440 01/11/15 0428 01/12/15 0503  WBC 5.8 4.1 5.5  NEUTROABS 3.9  --   --   HGB 11.3* 10.4* 11.1*  HCT 35.0* 31.9* 34.1*  MCV 94.6 94.1 95.0  PLT 174 144* 140*   Cardiac Enzymes:  Recent Labs Lab 01/10/15 0440  TROPONINI <0.03   BNP: BNP (last 3 results) No results for input(s): BNP in the last 8760 hours.  ProBNP (last 3 results) No results for input(s): PROBNP in the last 8760 hours.  CBG:  Recent Labs Lab 01/12/15 1703 01/12/15 2125  GLUCAP 106* 82       Signed:  Kathie Dike, M.D.  [facility] 01/13/2015  I, Rhett Bannister, acting a scribe, recorded this note contemporaneously in the presence of Dr. Kathie Dike, M.D. on 01/13/2015 at 8:11 AM  I have reviewed the above documentation for accuracy and completeness, and I agree with the above.  Mckala Pantaleon

## 2015-01-13 NOTE — Progress Notes (Signed)
Subjective: Patient denies any nausea or vomiting and denies any difficulty ij breathing  Objective: Vital signs in last 24 hours: Temp:  [97.9 F (36.6 C)-98.7 F (37.1 C)] 97.9 F (36.6 C) (08/27 0730) Pulse Rate:  [64-81] 68 (08/27 0730) Resp:  [16-18] 16 (08/27 0730) BP: (82-120)/(42-85) 101/61 mmHg (08/27 0730) SpO2:  [99 %-100 %] 99 % (08/27 0730) Weight:  [172 lb 4.8 oz (78.155 kg)] 172 lb 4.8 oz (78.155 kg) (08/27 0710)  Intake/Output from previous day: 08/26 0701 - 08/27 0700 In: 600 [P.O.:600] Out: -  Intake/Output this shift:     Recent Labs  01/11/15 0428 01/12/15 0503  HGB 10.4* 11.1*    Recent Labs  01/11/15 0428 01/12/15 0503  WBC 4.1 5.5  RBC 3.39* 3.59*  HCT 31.9* 34.1*  PLT 144* 140*    Recent Labs  01/12/15 0459 01/13/15 0610  NA 136 136  K 5.5* 5.9*  CL 95* 93*  CO2 29 28  BUN 31* 61*  CREATININE 6.32* 9.37*  GLUCOSE 88 82  CALCIUM 8.6* 8.6*   No results for input(s): LABPT, INR in the last 72 hours.  Generally patient is alert in no apparent distress. Chest is clear to auscultation Heart exam reveals regular rate and rhythm Extremities no edema   Assessment/Plan: Problem #1 end-stage renal disease she is status post hemodialysis on Thursday and presently denies any uremic sign and symptoams Problem #2 anemia: Her hemoglobin is within target goal Problem #3 history of hyperkalemia: Her potassium is high and presently patient is put on renal diet Problem #4 history of dizziness:  Problem #5 history of seizure disorder. No recent seizure activity Problem #6 metabolic bone disease: Calcium is range. Problem #7 history of lupus Plan: We'll make arrangements for patient to get dialysis today We'll change her bath to 1 K/2.5 calcium bath for 4 hours We'll try to remove about 21/2 L if her blood pressure tolerates Patient will go to her regular out patient dialysis unit after her discharge   [facility] S 01/13/2015, 8:49 AM

## 2015-01-15 LAB — ACETYLCHOLINE RECEPTOR, BINDING: Acety choline binding ab: <0.03 nmol/L (ref 0.00–0.24)

## 2015-07-04 ENCOUNTER — Encounter (HOSPITAL_COMMUNITY): Payer: Self-pay | Admitting: Vascular Surgery

## 2015-07-04 ENCOUNTER — Encounter (HOSPITAL_COMMUNITY): Payer: Self-pay | Admitting: Emergency Medicine

## 2015-07-04 ENCOUNTER — Emergency Department (HOSPITAL_COMMUNITY)
Admission: EM | Admit: 2015-07-04 | Discharge: 2015-07-04 | Disposition: A | Discharge status: Home / Self Care | Payer: MEDICAID | Attending: Emergency Medicine | Admitting: Emergency Medicine

## 2015-07-04 ENCOUNTER — Emergency Department (HOSPITAL_COMMUNITY)
Admission: EM | Admit: 2015-07-04 | Discharge: 2015-07-05 | Disposition: A | Discharge status: Home / Self Care | Payer: MEDICAID | Attending: Emergency Medicine | Admitting: Emergency Medicine

## 2015-07-04 ENCOUNTER — Emergency Department (HOSPITAL_COMMUNITY): Payer: MEDICAID

## 2015-07-04 DIAGNOSIS — G40909 Epilepsy, unspecified, not intractable, without status epilepticus: Secondary | ICD-10-CM | POA: Insufficient documentation

## 2015-07-04 DIAGNOSIS — M542 Cervicalgia: Secondary | ICD-10-CM | POA: Insufficient documentation

## 2015-07-04 DIAGNOSIS — I120 Hypertensive chronic kidney disease with stage 5 chronic kidney disease or end stage renal disease: Secondary | ICD-10-CM | POA: Insufficient documentation

## 2015-07-04 DIAGNOSIS — I509 Heart failure, unspecified: Secondary | ICD-10-CM | POA: Insufficient documentation

## 2015-07-04 DIAGNOSIS — Z8701 Personal history of pneumonia (recurrent): Secondary | ICD-10-CM | POA: Insufficient documentation

## 2015-07-04 DIAGNOSIS — Z992 Dependence on renal dialysis: Secondary | ICD-10-CM | POA: Insufficient documentation

## 2015-07-04 DIAGNOSIS — I129 Hypertensive chronic kidney disease with stage 1 through stage 4 chronic kidney disease, or unspecified chronic kidney disease: Secondary | ICD-10-CM | POA: Insufficient documentation

## 2015-07-04 DIAGNOSIS — M3214 Glomerular disease in systemic lupus erythematosus: Secondary | ICD-10-CM | POA: Insufficient documentation

## 2015-07-04 DIAGNOSIS — Z8719 Personal history of other diseases of the digestive system: Secondary | ICD-10-CM | POA: Insufficient documentation

## 2015-07-04 DIAGNOSIS — R51 Headache: Secondary | ICD-10-CM | POA: Insufficient documentation

## 2015-07-04 DIAGNOSIS — R519 Headache, unspecified: Secondary | ICD-10-CM

## 2015-07-04 DIAGNOSIS — Z862 Personal history of diseases of the blood and blood-forming organs and certain disorders involving the immune mechanism: Secondary | ICD-10-CM | POA: Insufficient documentation

## 2015-07-04 DIAGNOSIS — G43909 Migraine, unspecified, not intractable, without status migrainosus: Secondary | ICD-10-CM | POA: Insufficient documentation

## 2015-07-04 DIAGNOSIS — N186 End stage renal disease: Secondary | ICD-10-CM | POA: Insufficient documentation

## 2015-07-04 DIAGNOSIS — N189 Chronic kidney disease, unspecified: Secondary | ICD-10-CM | POA: Insufficient documentation

## 2015-07-04 DIAGNOSIS — Z79899 Other long term (current) drug therapy: Secondary | ICD-10-CM | POA: Insufficient documentation

## 2015-07-04 DIAGNOSIS — Z8541 Personal history of malignant neoplasm of cervix uteri: Secondary | ICD-10-CM | POA: Insufficient documentation

## 2015-07-04 LAB — CBC
HEMATOCRIT: 37.9 % (ref 36.0–46.0)
HEMOGLOBIN: 12 g/dL (ref 12.0–15.0)
MCH: 29.9 pg (ref 26.0–34.0)
MCHC: 31.7 g/dL (ref 30.0–36.0)
MCV: 94.3 fL (ref 78.0–100.0)
Platelets: 200 10*3/uL (ref 150–400)
RBC: 4.02 MIL/uL (ref 3.87–5.11)
RDW: 14 % (ref 11.5–15.5)
WBC: 6.8 10*3/uL (ref 4.0–10.5)

## 2015-07-04 LAB — DIFFERENTIAL
Basophils Absolute: 0 10*3/uL (ref 0.0–0.1)
Basophils Relative: 0 %
EOS PCT: 1 %
Eosinophils Absolute: 0.1 10*3/uL (ref 0.0–0.7)
LYMPHS ABS: 1 10*3/uL (ref 0.7–4.0)
Lymphocytes Relative: 14 %
MONOS PCT: 8 %
Monocytes Absolute: 0.5 10*3/uL (ref 0.1–1.0)
NEUTROS ABS: 5.2 10*3/uL (ref 1.7–7.7)
Neutrophils Relative %: 77 %

## 2015-07-04 LAB — COMPREHENSIVE METABOLIC PANEL
ALK PHOS: 70 U/L (ref 38–126)
ALT: 24 U/L (ref 14–54)
ANION GAP: 17 — AB (ref 5–15)
AST: 21 U/L (ref 15–41)
Albumin: 4 g/dL (ref 3.5–5.0)
BILIRUBIN TOTAL: 1.7 mg/dL — AB (ref 0.3–1.2)
BUN: 28 mg/dL — ABNORMAL HIGH (ref 6–20)
CALCIUM: 8.7 mg/dL — AB (ref 8.9–10.3)
CO2: 25 mmol/L (ref 22–32)
CREATININE: 6.85 mg/dL — AB (ref 0.44–1.00)
Chloride: 99 mmol/L — ABNORMAL LOW (ref 101–111)
GFR calc non Af Amer: 7 mL/min — ABNORMAL LOW (ref >60)
GFR, EST AFRICAN AMERICAN: 8 mL/min — AB (ref >60)
GLUCOSE: 95 mg/dL (ref 65–99)
Potassium: 4.5 mmol/L (ref 3.5–5.1)
Sodium: 141 mmol/L (ref 135–145)
TOTAL PROTEIN: 7.5 g/dL (ref 6.5–8.1)

## 2015-07-04 LAB — APTT: aPTT: 28 seconds (ref 24–37)

## 2015-07-04 LAB — I-STAT TROPONIN, ED: Troponin i, poc: 0 ng/mL (ref 0.00–0.08)

## 2015-07-04 LAB — PROTIME-INR
INR: 0.96 (ref 0.00–1.49)
PROTHROMBIN TIME: 13 s (ref 11.6–15.2)

## 2015-07-04 MED ORDER — SODIUM CHLORIDE 0.9 % IV BOLUS (SEPSIS)
1000.0000 mL | Freq: Once | INTRAVENOUS | Status: AC
  Administered 2015-07-04: 1000 mL via INTRAVENOUS

## 2015-07-04 MED ORDER — METOCLOPRAMIDE HCL 5 MG/ML IJ SOLN
5.0000 mg | Freq: Once | INTRAMUSCULAR | Status: AC
  Administered 2015-07-04: 5 mg via INTRAVENOUS
  Filled 2015-07-04: qty 2

## 2015-07-04 MED ORDER — DIPHENHYDRAMINE HCL 50 MG/ML IJ SOLN
25.0000 mg | Freq: Once | INTRAMUSCULAR | Status: AC
  Administered 2015-07-04: 25 mg via INTRAVENOUS
  Filled 2015-07-04: qty 1

## 2015-07-04 MED ORDER — DIPHENHYDRAMINE HCL 25 MG PO CAPS
25.0000 mg | ORAL_CAPSULE | Freq: Once | ORAL | Status: DC
  Filled 2015-07-04: qty 1

## 2015-07-04 MED ORDER — METOCLOPRAMIDE HCL 10 MG PO TABS
5.0000 mg | ORAL_TABLET | Freq: Once | ORAL | Status: DC
  Filled 2015-07-04: qty 1

## 2015-07-04 MED ORDER — DEXAMETHASONE SODIUM PHOSPHATE 10 MG/ML IJ SOLN
10.0000 mg | Freq: Once | INTRAMUSCULAR | Status: AC
  Administered 2015-07-04: 10 mg via INTRAVENOUS
  Filled 2015-07-04: qty 1

## 2015-07-04 NOTE — ED Notes (Signed)
Spoke with Dr. Maryan Rued who states to get head CT but does not need to be a code stroke.

## 2015-07-04 NOTE — ED Notes (Signed)
Pt reports migraine headache that began yesterday. Pt states she was seen at [facility] yesterday, but the pain has not improved. Denies vision changes or light sensitivity. AOx4

## 2015-07-04 NOTE — ED Notes (Signed)
Neuro checks not performed while pt was in the waiting room

## 2015-07-04 NOTE — ED Notes (Signed)
Family at bedside translating for patient; pt speaks spanish

## 2015-07-04 NOTE — Discharge Instructions (Signed)
Dolor de cabeza general sin causa (General Headache Without Cause) El dolor de cabeza es un dolor o malestar que se siente en la zona de la cabeza o del cuello. Hay muchas causas y tipos de dolores de [place]. En algunos casos, es posible que no se encuentre la causa.  CUIDADOS EN EL HOGAR  Control del [employer] de venta libre y los recetados solamente como se lo haya indicado el mdico.  Cuando sienta dolor de cabeza acustese en un cuarto oscuro y tranquilo.  Si se lo indican, aplique hielo sobre la cabeza y la zona del cuello:  Ponga el hielo en una bolsa plstica.  Coloque una toalla entre la piel y la bolsa de hielo.  Coloque el hielo durante 54minutos, 2 a 3veces por Training and development officer.  Utilice una almohadilla trmica o tome una ducha con agua caliente para aplicar calor en la cabeza y la zona del cuello como se lo haya indicado el [place] luces tenues si le [employer] luces brillantes o sus dolores de cabeza empeoran. Comida y bebida  Mantenga un horario para las comidas.  Beba menos alcohol.  Consuma menos o deje de tomar cafena. Instrucciones generales  Concurra a todas las visitas de control como se lo haya indicado el mdico. Esto es importante.  Lleve un registro diario para Neurosurgeon si ciertas cosas provocan los dolores de [place]. Por ejemplo, escriba los siguientes datos:  Lo que usted come y Buyer, retail.  Cunto tiempo duerme.  Algn cambio en su dieta o en los medicamentos.  Realice actividades relajantes, como recibir [place].  Disminuya el nivel de estrs.  Sintese con la espalda recta. No contraiga (tensione) los msculos.  No consuma productos que contengan tabaco. Estos incluyen cigarrillos, tabaco para mascar y Psychologist, sport and exercise. Si necesita ayuda para dejar de fumar, consulte al mdico.  Haga ejercicios con regularidad tal como se lo indic el mdico.  Duerma lo suficiente. Esto a menudo significa entre 7 y 9horas de  sueo. SOLICITE AYUDA SI:  Los medicamentos no logran [employer].  Tiene un dolor de cabeza que es diferente a los otros dolores de [place].  Tiene malestar estomacal (nuseas) o vomita.  Tiene fiebre. SOLICITE AYUDA DE INMEDIATO SI:   El dolor de [place].  Sigue vomitando.  Presenta rigidez en el cuello.  Tiene dificultad para ver.  Tiene dificultad para hablar.  Siente dolor en el ojo o en el odo.  Sus msculos estn dbiles, o pierde el control muscular.  Pierde el equilibrio o tiene problemas para Writer.  Siente que se desvanece (pierde el conocimiento) o se desmaya.  Se siente confundido.   Esta informacin no tiene Marine scientist el consejo del mdico. Asegrese de hacerle al mdico cualquier pregunta que tenga.   Document Released: 07/28/2011 Document Revised: 01/24/2015 Elsevier Interactive Patient Education [employer].

## 2015-07-04 NOTE — ED Notes (Signed)
Pt vomited PO medications; verbal order obtained from [facility] for IV route

## 2015-07-04 NOTE — ED Notes (Signed)
Triage RN from [place] called and said that pt is there at [facility] wanting to be seen.  Pt left without informing staff.

## 2015-07-04 NOTE — ED Provider Notes (Signed)
CSN: [id number]     Arrival date & time 07/04/15  1735 History   First MD Initiated Contact with Patient 07/04/15 2106     Chief Complaint  Patient presents with  . Headache    HPI   Spanish-speaking patient requesting son for translation.   Patient reports that today around 1 PM she had acute onset of severe frontal headache. She reports this headache is similar to previous episodes but lasted longer than normal. She reports that her headaches generally only last 15 minutes and then resolve on their own. She reports that her last headache yesterday, same acute onset with rapid progression, she was seen awake force [facility] where she had a CT scan and lumbar puncture with no significant findings. She reports she went home headache is supinated and then returned again today. She reports that she had not had any headaches for the last several years. ( This is not true based on medical documentation). Patient denies any recent trauma, reports that she was at dialysis today and was feeling well throughout dialysis. She reports weakness in her legs bilaterally, denies any focal neurological deficits. She reports the headache is frontal, with light and sound sensitivity, with neck pain, no stiffness. She denied taking any medications prior to arrival. She reports that this pain has been coming in waves every hour since its onset approximately 8 hours ago.  Chart review show shows that patient has had CT scan yesterday, MRI brain MRA brain in August 2016, CT head September 2015, CT head of August 2015, CT head in May 2015, CT head in January 2015.   Past Medical History  Diagnosis Date  . Gallstone pancreatitis Feb 2011  . Cervix carcinoma in situ Mar 2011  . Proteinuria - cause not known   . Hypertension   . Lupus ([place])   . CHF (congestive heart failure) ([place])   . Anemia   . Thrombocytopenia ([place])   . Blood transfusion   . Dialysis patient [facility])     tues, thursday, saturday.  . Renal failure,  acute on chronic ([place])   . Chronic kidney disease     T, Th, Sat  . Lupus nephritis ([place])   . Renal insufficiency   . Seizure disorder ([place]) 03/19/2013  . Pneumonia due to organism 03/02/2011   Past Surgical History  Procedure Laterality Date  . Appendectomy    . Cervical cone biopsy    . Cesarean section    . Cholecystectomy    . Embolectomy  10/10/2011    Procedure: EMBOLECTOMY;  Surgeon: Angelia Mould, MD;  Location: [place];  Service: Vascular;  Laterality: Right;  . Av fistula placement    . Av fistula placement  07/18/2011    Procedure: ARTERIOVENOUS (AV) FISTULA CREATION;  Surgeon: Angelia Mould, MD;  Location: [place];  Service: Vascular;  Laterality: Right;  . Av fistula placement  01/23/12    Revision of Right AVF  . Shuntogram Right 07/07/2011    Procedure: SHUNTOGRAM;  Surgeon: Angelia Mould, MD;  Location: [facility] C[facility] LAB;  Service: Cardiovascular;  Laterality: Right;  . Shuntogram N/A 12/08/2011    Procedure: Earney Mallet;  Surgeon: Angelia Mould, MD;  Location: [facility] CATH LAB;  Service: Cardiovascular;  Laterality: N/A;  . Fistulogram Right 04/12/2012    Procedure: FISTULOGRAM;  Surgeon: Angelia Mould, MD;  Location: [facility] CATH LAB;  Service: Cardiovascular;  Laterality: Right;   Family History  Problem Relation Age of Onset  . Diabetes Mother   .  Diabetes Brother   . Diabetes Brother   . Diabetes Maternal Grandmother   . Anesthesia problems Neg Hx    Social History  Substance Use Topics  . Smoking status: Never Smoker   . Smokeless tobacco: Never Used  . Alcohol Use: No   OB History    Gravida Para Term Preterm AB TAB SAB Ectopic Multiple Living   4 3   1  1   3      Review of Systems  All other systems reviewed and are negative.   Allergies  Review of patient's allergies indicates no known allergies.  Home Medications   Prior to Admission medications   Medication Sig Start Date End Date Taking? Authorizing Provider   azaTHIOprine (IMURAN) 50 MG tablet Take 0.5 tablets (25 mg total) by mouth daily. 01/13/15  Yes Kathie Dike, MD  calcium acetate (PHOSLO) 667 MG capsule Take 667-1,334 mg by mouth 3 (three) times daily with meals. Patient takes 3 tablets with meals and 1 tablet with snack   Yes Historical Provider, MD  hydroxychloroquine (PLAQUENIL) 200 MG tablet Take 1 tablet (200 mg total) by mouth daily. 01/13/15  Yes Kathie Dike, MD  levETIRAcetam (KEPPRA) 250 MG tablet Take 250 mg by mouth every Tuesday, Thursday, and Saturday at 6 PM. ON DIALYSIS DAYS   Yes Historical Provider, MD  levETIRAcetam (KEPPRA) 500 MG tablet Take 500 mg by mouth 2 (two) times daily.   Yes Historical Provider, MD  lidocaine-prilocaine (EMLA) cream Apply 1 application topically as needed (before dialysis).   Yes Historical Provider, MD  sevelamer carbonate (RENVELA) 800 MG tablet Take 800 mg by mouth 3 (three) times daily with meals.   Yes Historical Provider, MD   BP 106/72 mmHg  Pulse 69  Temp(Src) 98 F (36.7 C) (Oral)  Resp 16  SpO2 99%   Physical Exam  Constitutional: She is oriented to person, place, and time. She appears well-developed and well-nourished.  HENT:  Head: Normocephalic and atraumatic.  Eyes: Conjunctivae are normal. Pupils are equal, round, and reactive to light. Right eye exhibits no discharge. Left eye exhibits no discharge. No scleral icterus.  Neck: Normal range of motion. No JVD present. No tracheal deviation present.  Pulmonary/Chest: Effort normal and breath sounds normal. No stridor. No respiratory distress. She has no wheezes. She has no rales. She exhibits no tenderness.  Neurological: She is alert and oriented to person, place, and time. She has normal strength. No cranial nerve deficit or sensory deficit. Coordination normal. GCS eye subscore is 4. GCS verbal subscore is 5. GCS motor subscore is 6.  Reflex Scores:      Patellar reflexes are 2+ on the right side and 2+ on the left  side. Skin: Skin is warm and dry. No rash noted. No erythema. No pallor.  Psychiatric: She has a normal mood and affect. Her behavior is normal. Judgment and thought content normal.  Nursing note and vitals reviewed.    ED Course  Procedures (including critical care time) Labs Review Labs Reviewed  COMPREHENSIVE METABOLIC PANEL - Abnormal; Notable for the following:    Chloride 99 (*)    BUN 28 (*)    Creatinine, Ser 6.85 (*)    Calcium 8.7 (*)    Total Bilirubin 1.7 (*)    GFR calc non Af Amer 7 (*)    GFR calc Af Amer 8 (*)    Anion gap 17 (*)    All other components within normal limits  PROTIME-INR  APTT  CBC  DIFFERENTIAL  I-STAT TROPOININ, ED    Imaging Review Ct Head Wo Contrast  07/04/2015  CLINICAL DATA:  40 year old female with sudden onset frontal headache and dizziness EXAM: CT HEAD WITHOUT CONTRAST TECHNIQUE: Contiguous axial images were obtained from the base of the skull through the vertex without intravenous contrast. COMPARISON:  Prior brain MRI 01/10/2015; prior head CT also 01/10/2015 FINDINGS: Negative for acute intracranial hemorrhage, acute infarction, mass, mass effect, hydrocephalus or midline shift. Gray-white differentiation is preserved throughout. No acute soft tissue or calvarial abnormality. The globes and orbits are symmetric and unremarkable. Mucous retention cyst in the inferior right maxillary sinus. IMPRESSION: Negative head CT. Mucous retention cyst in the inferior right maxillary sinus. Electronically Signed   By: Jacqulynn Cadet M.D.   On: 07/04/2015 18:11   I have personally reviewed and evaluated these images and lab results as part of my medical decision-making.   EKG Interpretation None      MDM   Final diagnoses:  Acute nonintractable headache, unspecified headache type    Labs: I-STAT troponin, PT/INR, PTT, CBC, differential, CMP  Imaging: DT head without contrast  Consults:  Therapeutics: Benadryl, Reglan, normal saline,  Decadron  Discharge Meds:   Assessment/Plan: 40 year old female presents today with headache. She initially told me that this headache was abrupt onset, but later related that this was slow onset. She initially told me that she hadn't had any previous headaches recently other than yesterday, chart review shows that she has had other significant headaches as evidenced by numerous CT scans and MRI studies. Patient was seen yesterday for identical presentation with negative head CT and lumbar puncture. This episode resolved, and then returned again today. The pain is waxing and waning. Patient had a normal CT scan within 6 hours of onset of headache here. Due to patient's reoccurring symptoms, I have low suspicion for subarachnoid hemorrhage, meningitis, or any acute life-threatening abnormality of the brain. Patients pain almost completely eliminated here in the ED, should be instructed follow-up with urology tomorrow for further evaluation and management. She is given strict return precautions, both her, her son, her husband verbalized understanding and agreement for today's plan and had no further questions or concerns at the time of discharge      Okey Regal, PA-C 07/05/15 [place], MD 07/05/15 [phone]

## 2015-07-04 NOTE — ED Notes (Addendum)
Pt reports to the ED for eval of sudden onset severe HA at approx 1300 after she finished dialysis. Light and sound make the pain worse. Pt reports nausea but denies any active vomiting. Pt tearful. She also reports dizziness. She reports a hx of lupus and states this also feels like a flare up of her lupus. Denies any trouble walking, talking, vision changes, or unilateral weakness. Pt A&Ox4, resp e/u, and skin warm and dry.

## 2017-02-14 ENCOUNTER — Emergency Department (HOSPITAL_COMMUNITY): Payer: Self-pay

## 2017-02-14 ENCOUNTER — Encounter (HOSPITAL_COMMUNITY): Payer: Self-pay | Admitting: Emergency Medicine

## 2017-02-14 ENCOUNTER — Emergency Department (HOSPITAL_COMMUNITY)
Admission: EM | Admit: 2017-02-14 | Discharge: 2017-02-14 | Disposition: A | Discharge status: Home / Self Care | Payer: Self-pay | Attending: Emergency Medicine | Admitting: Emergency Medicine

## 2017-02-14 DIAGNOSIS — I509 Heart failure, unspecified: Secondary | ICD-10-CM | POA: Insufficient documentation

## 2017-02-14 DIAGNOSIS — R05 Cough: Secondary | ICD-10-CM | POA: Insufficient documentation

## 2017-02-14 DIAGNOSIS — I132 Hypertensive heart and chronic kidney disease with heart failure and with stage 5 chronic kidney disease, or end stage renal disease: Secondary | ICD-10-CM | POA: Insufficient documentation

## 2017-02-14 DIAGNOSIS — Z79899 Other long term (current) drug therapy: Secondary | ICD-10-CM | POA: Insufficient documentation

## 2017-02-14 DIAGNOSIS — R059 Cough, unspecified: Secondary | ICD-10-CM

## 2017-02-14 DIAGNOSIS — Z992 Dependence on renal dialysis: Secondary | ICD-10-CM | POA: Insufficient documentation

## 2017-02-14 DIAGNOSIS — N186 End stage renal disease: Secondary | ICD-10-CM | POA: Insufficient documentation

## 2017-02-14 DIAGNOSIS — R109 Unspecified abdominal pain: Secondary | ICD-10-CM

## 2017-02-14 LAB — LIPASE, BLOOD: Lipase: 53 U/L — ABNORMAL HIGH (ref 11–51)

## 2017-02-14 LAB — BASIC METABOLIC PANEL WITH GFR
Anion gap: 12 (ref 5–15)
BUN: 13 mg/dL (ref 6–20)
CO2: 34 mmol/L — ABNORMAL HIGH (ref 22–32)
Calcium: 9.5 mg/dL (ref 8.9–10.3)
Chloride: 96 mmol/L — ABNORMAL LOW (ref 101–111)
Creatinine, Ser: 3.75 mg/dL — ABNORMAL HIGH (ref 0.44–1.00)
GFR calc Af Amer: 16 mL/min — ABNORMAL LOW
GFR calc non Af Amer: 14 mL/min — ABNORMAL LOW
Glucose, Bld: 90 mg/dL (ref 65–99)
Potassium: 4.1 mmol/L (ref 3.5–5.1)
Sodium: 142 mmol/L (ref 135–145)

## 2017-02-14 LAB — HEPATIC FUNCTION PANEL
ALK PHOS: 79 U/L (ref 38–126)
ALT: 9 U/L — ABNORMAL LOW (ref 14–54)
AST: 17 U/L (ref 15–41)
Albumin: 4 g/dL (ref 3.5–5.0)
BILIRUBIN INDIRECT: 0.6 mg/dL (ref 0.3–0.9)
Bilirubin, Direct: 0.1 mg/dL (ref 0.1–0.5)
TOTAL PROTEIN: 7.9 g/dL (ref 6.5–8.1)
Total Bilirubin: 0.7 mg/dL (ref 0.3–1.2)

## 2017-02-14 LAB — CBC WITH DIFFERENTIAL/PLATELET
BASOS ABS: 0 10*3/uL (ref 0.0–0.1)
BASOS PCT: 0 %
EOS ABS: 0.1 10*3/uL (ref 0.0–0.7)
EOS PCT: 3 %
HEMATOCRIT: 35.6 % — AB (ref 36.0–46.0)
Hemoglobin: 11.5 g/dL — ABNORMAL LOW (ref 12.0–15.0)
Lymphocytes Relative: 20 %
Lymphs Abs: 1 10*3/uL (ref 0.7–4.0)
MCH: 29.5 pg (ref 26.0–34.0)
MCHC: 32.3 g/dL (ref 30.0–36.0)
MCV: 91.3 fL (ref 78.0–100.0)
MONO ABS: 0.3 10*3/uL (ref 0.1–1.0)
Monocytes Relative: 5 %
NEUTROS ABS: 3.7 10*3/uL (ref 1.7–7.7)
Neutrophils Relative %: 72 %
PLATELETS: 181 10*3/uL (ref 150–400)
RBC: 3.9 MIL/uL (ref 3.87–5.11)
RDW: 14.9 % (ref 11.5–15.5)
WBC: 5.2 10*3/uL (ref 4.0–10.5)

## 2017-02-14 LAB — I-STAT TROPONIN, ED: Troponin i, poc: 0 ng/mL (ref 0.00–0.08)

## 2017-02-14 LAB — HCG, QUANTITATIVE, PREGNANCY: hCG, Beta Chain, Quant, S: 1 m[IU]/mL

## 2017-02-14 MED ORDER — IOPAMIDOL (ISOVUE-300) INJECTION 61%
INTRAVENOUS | Status: AC
  Filled 2017-02-14: qty 30

## 2017-02-14 MED ORDER — ACETAMINOPHEN 325 MG PO TABS
650.0000 mg | ORAL_TABLET | Freq: Once | ORAL | Status: AC
  Administered 2017-02-14: 650 mg via ORAL
  Filled 2017-02-14: qty 2

## 2017-02-14 NOTE — ED Provider Notes (Signed)
Pt received at sign out with CT A/P pending. Pt c/o cough and abd pain. Workup reassuring. Tx symptomatically at this time. Dx and testing d/w pt and family.  Questions answered.  Verb understanding, agreeable to d/c home with outpt f/u.    Results for orders placed or performed during the hospital encounter of 58/52/77  Basic metabolic panel  Result Value Ref Range   Sodium 142 135 - 145 mmol/L   Potassium 4.1 3.5 - 5.1 mmol/L   Chloride 96 (L) 101 - 111 mmol/L   CO2 34 (H) 22 - 32 mmol/L   Glucose, Bld 90 65 - 99 mg/dL   BUN 13 6 - 20 mg/dL   Creatinine, Ser 3.75 (H) 0.44 - 1.00 mg/dL   Calcium 9.5 8.9 - 10.3 mg/dL   GFR calc non Af Amer 14 (L) >60 mL/min   GFR calc Af Amer 16 (L) >60 mL/min   Anion gap 12 5 - 15  CBC with Differential  Result Value Ref Range   WBC 5.2 4.0 - 10.5 K/uL   RBC 3.90 3.87 - 5.11 MIL/uL   Hemoglobin 11.5 (L) 12.0 - 15.0 g/dL   HCT 35.6 (L) 36.0 - 46.0 %   MCV 91.3 78.0 - 100.0 fL   MCH 29.5 26.0 - 34.0 pg   MCHC 32.3 30.0 - 36.0 g/dL   RDW 14.9 11.5 - 15.5 %   Platelets 181 150 - 400 K/uL   Neutrophils Relative % 72 %   Neutro Abs 3.7 1.7 - 7.7 K/uL   Lymphocytes Relative 20 %   Lymphs Abs 1.0 0.7 - 4.0 K/uL   Monocytes Relative 5 %   Monocytes Absolute 0.3 0.1 - 1.0 K/uL   Eosinophils Relative 3 %   Eosinophils Absolute 0.1 0.0 - 0.7 K/uL   Basophils Relative 0 %   Basophils Absolute 0.0 0.0 - 0.1 K/uL  Lipase, blood  Result Value Ref Range   Lipase 53 (H) 11 - 51 U/L  Hepatic function panel  Result Value Ref Range   Total Protein 7.9 6.5 - 8.1 g/dL   Albumin 4.0 3.5 - 5.0 g/dL   AST 17 15 - 41 U/L   ALT 9 (L) 14 - 54 U/L   Alkaline Phosphatase 79 38 - 126 U/L   Total Bilirubin 0.7 0.3 - 1.2 mg/dL   Bilirubin, Direct 0.1 0.1 - 0.5 mg/dL   Indirect Bilirubin 0.6 0.3 - 0.9 mg/dL  hCG, quantitative, pregnancy  Result Value Ref Range   hCG, Beta Chain, Quant, S 1 <5 mIU/mL  I-stat troponin, ED  Result Value Ref Range   Troponin i,  poc 0.00 0.00 - 0.08 ng/mL   Comment 3           Ct Abdomen Wo Contrast Result Date: 02/14/2017 CLINICAL DATA:  Abdominal pain. EXAM: CT ABDOMEN WITHOUT CONTRAST TECHNIQUE: Multidetector CT imaging of the abdomen was performed following the standard protocol without IV contrast. COMPARISON:  CT scan of August 19, 2014. FINDINGS: Lower chest: No acute abnormality. Hepatobiliary: No focal liver abnormality is seen. Status post cholecystectomy. No biliary dilatation. Pancreas: Unremarkable. No pancreatic ductal dilatation or surrounding inflammatory changes. Spleen: Calcified splenic granulomata are noted. Adrenals/Urinary Tract: Adrenal glands are unremarkable. Severe bilateral renal atrophy is noted consistent with end-stage renal disease. Stomach/Bowel: The stomach and visualized bowel are unremarkable. No evidence of bowel dilatation or inflammation is noted. Vascular/Lymphatic: No significant vascular abnormality is noted. No adenopathy is noted. Other: No abdominal wall hernia or abnormality.  Musculoskeletal: No acute or significant osseous findings. IMPRESSION: Calcified splenic granulomata. Severe bilateral renal atrophy consistent with end-stage renal disease. No acute abnormality seen within the abdomen. Electronically Signed   By: Marijo Conception, M.D.   On: 02/14/2017 16:28   Dg Chest 2 View Result Date: 02/14/2017 CLINICAL DATA:  42 year old female with a history of chest pain and dizziness EXAM: CHEST  2 VIEW COMPARISON:  01/10/2015 FINDINGS: Cardiomediastinal silhouette within normal limits. No evidence of central vascular congestion. No pneumothorax or pleural effusion. No confluent airspace disease. No displaced fracture. IMPRESSION: No radiographic evidence of acute cardiopulmonary disease Electronically Signed   By: Corrie Mckusick D.O.   On: 02/14/2017 12:47      Francine Graven, DO 02/14/17 1639

## 2017-02-14 NOTE — ED Notes (Signed)
ED Provider at bedside. 

## 2017-02-14 NOTE — Discharge Instructions (Signed)
Take over the counter tylenol, as directed on packaging, as needed for discomfort. Call your regular medical doctor on Monday to schedule a follow up appointment this week.  Return to the Emergency Department immediately sooner if worsening.

## 2017-02-14 NOTE — ED Provider Notes (Signed)
[name] DEPT Provider Note   CSN: [id number] Arrival date & time: 02/14/17  1120     History   Chief Complaint Chief Complaint  Patient presents with  . Chest Pain    HPI Maria Vazquez is a 41 y.o. female.  HPI  41 year old female presents with a chief complaint of cough. History is taken with the help of the Spanish interpreter phone. Patient states that after dialysis today she started developing a dry cough and shortness of breath. She was quite short of breath at dialysis but this is improved. However she still has an intermittent dry cough. She has left-sided chest pain that started after the cough and worsens with cough. No vomiting. No diarrhea. She does endorse some umbilical abdominal pain that has been going on for about 2 days. It is worse with palpation but she denies any other modifying factors. No fever. Never had abdominal pain before.  No GU symptoms.  Past Medical History:  Diagnosis Date  . Anemia   . Blood transfusion   . Cervix carcinoma in situ Mar 2011  . CHF (congestive heart failure) ([place])   . Chronic kidney disease    T, Th, Sat  . Dialysis patient [facility])    tues, thursday, saturday.  . Gallstone pancreatitis Feb 2011  . Hypertension   . Lupus   . Lupus nephritis ([place])   . Pneumonia due to organism 03/02/2011  . Proteinuria - cause not known   . Renal failure, acute on chronic (HCC)   . Renal insufficiency   . Seizure disorder ([place]) 03/19/2013  . Thrombocytopenia [facility])     Patient Active Problem List   Diagnosis Date Noted  . Vertigo 01/10/2015  . HTN (hypertension) 01/10/2015  . Hyperkalemia 01/10/2015  . Dizziness 01/10/2015  . Pancreatitis, acute 01/06/2014  . Chest pain 01/05/2014  . Left sided chest pain 01/05/2014  . Nausea & vomiting 01/05/2014  . Abdominal pain, lower 01/05/2014  . Yeast vaginitis 01/05/2014  . Seizure disorder ([place]) 03/19/2013  . Meningitis 03/19/2013  . ESRD (end stage renal disease) on  dialysis ([place]) 03/19/2013  . Pericardial effusion 03/19/2013  . End stage renal disease ([place]) 10/08/2011  . Lupus (systemic lupus erythematosus) ([place]) 03/02/2011  . ARF (acute renal failure) ([place]) 03/02/2011  . Chest pain at rest 03/02/2011  . Hypertensive urgency 03/02/2011  . Pneumonia due to organism 03/02/2011  . Proteinuria - cause not known   . SOB (shortness of breath) 01/09/2011  . Bilateral swelling of feet 01/09/2011  . Facial swelling 01/09/2011  . Proteinuria 01/09/2011  . Anemia in ESRD (end-stage renal disease) ([place]) 01/09/2011    Past Surgical History:  Procedure Laterality Date  . APPENDECTOMY    . AV FISTULA PLACEMENT    . AV FISTULA PLACEMENT  07/18/2011   Procedure: ARTERIOVENOUS (AV) FISTULA CREATION;  Surgeon: Angelia Mould, MD;  Location: [place];  Service: Vascular;  Laterality: Right;  . AV FISTULA PLACEMENT  01/23/12   Revision of Right AVF  . CERVICAL CONE BIOPSY    . CESAREAN SECTION    . CHOLECYSTECTOMY    . EMBOLECTOMY  10/10/2011   Procedure: EMBOLECTOMY;  Surgeon: Angelia Mould, MD;  Location: [place];  Service: Vascular;  Laterality: Right;  . FISTULOGRAM Right 04/12/2012   Procedure: FISTULOGRAM;  Surgeon: Angelia Mould, MD;  Location: [facility] CATH LAB;  Service: Cardiovascular;  Laterality: Right;  . SHUNTOGRAM Right 07/07/2011   Procedure: SHUNTOGRAM;  Surgeon: Angelia Mould, MD;  Location:  [place] CATH LAB;  Service: Cardiovascular;  Laterality: Right;  . SHUNTOGRAM N/A 12/08/2011   Procedure: Earney Mallet;  Surgeon: Angelia Mould, MD;  Location: [facility] CATH LAB;  Service: Cardiovascular;  Laterality: N/A;    OB History    Gravida Para Term Preterm AB Living   4 3     1 3    SAB TAB Ectopic Multiple Live Births   1               Home Medications    Prior to Admission medications   Medication Sig Start Date End Date Taking? Authorizing Provider  calcium acetate (PHOSLO) 667 MG capsule Take 667-1,334 mg by mouth 3 (three)  times daily with meals. Patient takes 3 tablets with meals and 1 tablet with snack   Yes [provider]  hydroxychloroquine (PLAQUENIL) 200 MG tablet Take 1 tablet (200 mg total) by mouth daily. 01/13/15  Yes Kathie Dike, MD  levETIRAcetam (KEPPRA) 250 MG tablet Take 250 mg by mouth every Tuesday, Thursday, and Saturday at 6 PM. ON DIALYSIS DAYS   Yes [provider]  levETIRAcetam (KEPPRA) 500 MG tablet Take 500 mg by mouth 2 (two) times daily.   Yes [provider]  lidocaine-prilocaine (EMLA) cream Apply 1 application topically as needed (before dialysis).   Yes [provider]  sevelamer carbonate (RENVELA) 800 MG tablet Take 800 mg by mouth 3 (three) times daily with meals.   Yes [provider]    Family History Family History  Problem Relation Age of Onset  . Diabetes Mother   . Diabetes Brother   . Diabetes Brother   . Diabetes Maternal Grandmother   . Anesthesia problems Neg Hx     Social History Social History  Substance Use Topics  . Smoking status: Never Smoker  . Smokeless tobacco: Never Used  . Alcohol use No     Allergies   Patient has no known allergies.   Review of Systems Review of Systems  Constitutional: Negative for fever.  Respiratory: Positive for cough. Negative for shortness of breath (earlier today, none now).   Cardiovascular: Positive for chest pain. Negative for leg swelling.  Gastrointestinal: Positive for abdominal pain. Negative for diarrhea, nausea and vomiting.  Genitourinary: Negative for vaginal bleeding.  Musculoskeletal: Negative for back pain.  All other systems reviewed and are negative.    Physical Exam Updated Vital Signs BP (!) 143/82 (BP Location: Left Arm)   Pulse 78   Temp 98.1 F (36.7 C) (Oral)   Resp 19   Ht 5\' 6"  (1.676 m)   Wt 68 kg (150 lb)   LMP 02/12/2017   SpO2 100%   BMI 24.21 kg/m   Physical Exam  Constitutional: She is oriented to person, place, and  time. She appears well-developed and well-nourished. No distress.  HENT:  Head: Normocephalic and atraumatic.  Right Ear: External ear normal.  Left Ear: External ear normal.  Nose: Nose normal.  Eyes: Right eye exhibits no discharge. Left eye exhibits no discharge.  Cardiovascular: Normal rate, regular rhythm and normal heart sounds.   Pulmonary/Chest: Effort normal and breath sounds normal. She has no wheezes. She has no rales. She exhibits tenderness (left anterior chest).  Abdominal: Soft. There is tenderness in the periumbilical area.  Neurological: She is alert and oriented to person, place, and time.  Skin: Skin is warm and dry. She is not diaphoretic.  Nursing note and vitals reviewed.    ED Treatments / Results  Labs (all  labs ordered are listed, but only abnormal results are displayed) Labs Reviewed  BASIC METABOLIC PANEL - Abnormal; Notable for the following:       Result Value   Chloride 96 (*)    CO2 34 (*)    Creatinine, Ser 3.75 (*)    GFR calc non Af Amer 14 (*)    GFR calc Af Amer 16 (*)    All other components within normal limits  CBC WITH DIFFERENTIAL/PLATELET - Abnormal; Notable for the following:    Hemoglobin 11.5 (*)    HCT 35.6 (*)    All other components within normal limits  LIPASE, BLOOD  HEPATIC FUNCTION PANEL  PREGNANCY, URINE  I-STAT TROPONIN, ED    EKG  EKG Interpretation  Date/Time:  Saturday February 14 2017 11:40:19 EDT Ventricular Rate:  77 PR Interval:    QRS Duration: 89 QT Interval:  447 QTC Calculation: 506 R Axis:   62 Text Interpretation:  Sinus rhythm Consider right atrial enlargement Borderline prolonged QT interval flipped T wave AVL new, otherwise similar to Feb 2017 Confirmed by Sherwood Gambler [phone]) on 02/14/2017 11:58:51 AM       Radiology Dg Chest 2 View  Result Date: 02/14/2017 CLINICAL DATA:  41 year old female with a history of chest pain and dizziness EXAM: CHEST  2 VIEW COMPARISON:  01/10/2015 FINDINGS:  Cardiomediastinal silhouette within normal limits. No evidence of central vascular congestion. No pneumothorax or pleural effusion. No confluent airspace disease. No displaced fracture. IMPRESSION: No radiographic evidence of acute cardiopulmonary disease Electronically Signed   By: Corrie Mckusick D.O.   On: 02/14/2017 12:47    Procedures Procedures (including critical care time)  Medications Ordered in ED Medications  acetaminophen (TYLENOL) tablet 650 mg (not administered)     Initial Impression / Assessment and Plan / ED Course  I have reviewed the triage vital signs and the nursing notes.  Pertinent labs & imaging results that were available during my care of the patient were reviewed by me and considered in my medical decision making (see chart for details).     Patient's CP is likely from acute coughing. Highly doubt ACS, PE or dissection. Given her abdominal tenderness, CT pending. Otherwise appears well, dialyzed today. Care to Dr. Thurnell Garbe with CT pending. D/c home if unremarkable.   Final Clinical Impressions(s) / ED Diagnoses   Final diagnoses:  None    New Prescriptions New Prescriptions   No medications on file     Sherwood Gambler, MD 02/14/17 [phone]

## 2017-02-14 NOTE — ED Triage Notes (Signed)
Pt coming from dialysis and started complaining of CP and dizziness radiating to her jaw. VSS.

## 2017-03-12 ENCOUNTER — Other Ambulatory Visit (HOSPITAL_COMMUNITY): Payer: Self-pay | Admitting: Nephrology

## 2017-03-12 ENCOUNTER — Other Ambulatory Visit: Payer: Self-pay | Admitting: Radiology

## 2017-03-12 DIAGNOSIS — N186 End stage renal disease: Secondary | ICD-10-CM

## 2017-03-12 DIAGNOSIS — Z992 Dependence on renal dialysis: Principal | ICD-10-CM

## 2017-03-13 ENCOUNTER — Inpatient Hospital Stay (HOSPITAL_COMMUNITY)
Admission: EM | Admit: 2017-03-13 | Discharge: 2017-03-19 | DRG: 252 | Disposition: A | Discharge status: Home / Self Care | Payer: Medicaid Other | Attending: Internal Medicine | Admitting: Internal Medicine

## 2017-03-13 ENCOUNTER — Other Ambulatory Visit: Payer: Self-pay

## 2017-03-13 ENCOUNTER — Encounter (HOSPITAL_COMMUNITY): Payer: Self-pay

## 2017-03-13 ENCOUNTER — Encounter (HOSPITAL_COMMUNITY): Payer: Self-pay | Admitting: Emergency Medicine

## 2017-03-13 ENCOUNTER — Other Ambulatory Visit (HOSPITAL_COMMUNITY): Payer: Self-pay | Admitting: Nephrology

## 2017-03-13 ENCOUNTER — Ambulatory Visit (HOSPITAL_COMMUNITY)
Admission: RE | Admit: 2017-03-13 | Discharge: 2017-03-13 | Disposition: A | Discharge status: Home / Self Care | Payer: Medicaid Other | Source: Ambulatory Visit | Attending: Nephrology | Admitting: Nephrology

## 2017-03-13 ENCOUNTER — Emergency Department (HOSPITAL_COMMUNITY): Payer: Medicaid Other

## 2017-03-13 DIAGNOSIS — N186 End stage renal disease: Secondary | ICD-10-CM

## 2017-03-13 DIAGNOSIS — Z86001 Personal history of in-situ neoplasm of cervix uteri: Secondary | ICD-10-CM | POA: Diagnosis not present

## 2017-03-13 DIAGNOSIS — Z992 Dependence on renal dialysis: Principal | ICD-10-CM

## 2017-03-13 DIAGNOSIS — D6862 Lupus anticoagulant syndrome: Secondary | ICD-10-CM | POA: Diagnosis present

## 2017-03-13 DIAGNOSIS — E213 Hyperparathyroidism, unspecified: Secondary | ICD-10-CM | POA: Diagnosis present

## 2017-03-13 DIAGNOSIS — N2581 Secondary hyperparathyroidism of renal origin: Secondary | ICD-10-CM | POA: Diagnosis present

## 2017-03-13 DIAGNOSIS — I132 Hypertensive heart and chronic kidney disease with heart failure and with stage 5 chronic kidney disease, or end stage renal disease: Secondary | ICD-10-CM | POA: Diagnosis present

## 2017-03-13 DIAGNOSIS — Y838 Other surgical procedures as the cause of abnormal reaction of the patient, or of later complication, without mention of misadventure at the time of the procedure: Secondary | ICD-10-CM | POA: Diagnosis present

## 2017-03-13 DIAGNOSIS — I2699 Other pulmonary embolism without acute cor pulmonale: Secondary | ICD-10-CM | POA: Diagnosis not present

## 2017-03-13 DIAGNOSIS — I509 Heart failure, unspecified: Secondary | ICD-10-CM | POA: Diagnosis present

## 2017-03-13 DIAGNOSIS — Y828 Other medical devices associated with adverse incidents: Secondary | ICD-10-CM | POA: Diagnosis present

## 2017-03-13 DIAGNOSIS — R778 Other specified abnormalities of plasma proteins: Secondary | ICD-10-CM | POA: Insufficient documentation

## 2017-03-13 DIAGNOSIS — R7989 Other specified abnormal findings of blood chemistry: Secondary | ICD-10-CM | POA: Diagnosis present

## 2017-03-13 DIAGNOSIS — M3211 Endocarditis in systemic lupus erythematosus: Secondary | ICD-10-CM

## 2017-03-13 DIAGNOSIS — Y832 Surgical operation with anastomosis, bypass or graft as the cause of abnormal reaction of the patient, or of later complication, without mention of misadventure at the time of the procedure: Secondary | ICD-10-CM | POA: Diagnosis present

## 2017-03-13 DIAGNOSIS — M3214 Glomerular disease in systemic lupus erythematosus: Secondary | ICD-10-CM | POA: Diagnosis present

## 2017-03-13 DIAGNOSIS — Z79899 Other long term (current) drug therapy: Secondary | ICD-10-CM | POA: Diagnosis not present

## 2017-03-13 DIAGNOSIS — T8249XA Other complication of vascular dialysis catheter, initial encounter: Secondary | ICD-10-CM | POA: Diagnosis present

## 2017-03-13 DIAGNOSIS — D631 Anemia in chronic kidney disease: Secondary | ICD-10-CM | POA: Diagnosis present

## 2017-03-13 DIAGNOSIS — G40909 Epilepsy, unspecified, not intractable, without status epilepticus: Secondary | ICD-10-CM | POA: Diagnosis present

## 2017-03-13 DIAGNOSIS — I160 Hypertensive urgency: Secondary | ICD-10-CM | POA: Diagnosis present

## 2017-03-13 DIAGNOSIS — M329 Systemic lupus erythematosus, unspecified: Secondary | ICD-10-CM

## 2017-03-13 DIAGNOSIS — T45515A Adverse effect of anticoagulants, initial encounter: Secondary | ICD-10-CM | POA: Diagnosis not present

## 2017-03-13 DIAGNOSIS — T82868A Thrombosis of vascular prosthetic devices, implants and grafts, initial encounter: Principal | ICD-10-CM | POA: Diagnosis present

## 2017-03-13 DIAGNOSIS — R079 Chest pain, unspecified: Secondary | ICD-10-CM | POA: Diagnosis present

## 2017-03-13 DIAGNOSIS — E875 Hyperkalemia: Secondary | ICD-10-CM | POA: Diagnosis present

## 2017-03-13 DIAGNOSIS — E872 Acidosis: Secondary | ICD-10-CM | POA: Diagnosis present

## 2017-03-13 DIAGNOSIS — R748 Abnormal levels of other serum enzymes: Secondary | ICD-10-CM

## 2017-03-13 DIAGNOSIS — I10 Essential (primary) hypertension: Secondary | ICD-10-CM

## 2017-03-13 HISTORY — PX: IR US GUIDE VASC ACCESS RIGHT: IMG2390

## 2017-03-13 HISTORY — PX: IR US GUIDE VASC ACCESS LEFT: IMG2389

## 2017-03-13 HISTORY — PX: IR THROMBECTOMY AV FISTULA W/THROMBOLYSIS/PTA INC/SHUNT/IMG RIGHT: IMG6119

## 2017-03-13 HISTORY — PX: IR FLUORO GUIDE CV LINE LEFT: IMG2282

## 2017-03-13 LAB — BASIC METABOLIC PANEL
Anion gap: 15 (ref 5–15)
Anion gap: 18 — ABNORMAL HIGH (ref 5–15)
BUN: 53 mg/dL — AB (ref 6–20)
BUN: 60 mg/dL — AB (ref 6–20)
CALCIUM: 8.3 mg/dL — AB (ref 8.9–10.3)
CHLORIDE: 100 mmol/L — AB (ref 101–111)
CHLORIDE: 99 mmol/L — AB (ref 101–111)
CO2: 24 mmol/L (ref 22–32)
CO2: 19 mmol/L — ABNORMAL LOW (ref 22–32)
CREATININE: 12.96 mg/dL — AB (ref 0.44–1.00)
Calcium: 8.7 mg/dL — ABNORMAL LOW (ref 8.9–10.3)
Creatinine, Ser: 12.42 mg/dL — ABNORMAL HIGH (ref 0.44–1.00)
GFR calc Af Amer: 4 mL/min — ABNORMAL LOW (ref >60)
GFR calc Af Amer: 4 mL/min — ABNORMAL LOW (ref >60)
GFR calc non Af Amer: 3 mL/min — ABNORMAL LOW (ref >60)
GFR calc non Af Amer: 3 mL/min — ABNORMAL LOW (ref >60)
GLUCOSE: 87 mg/dL (ref 65–99)
Glucose, Bld: 119 mg/dL — ABNORMAL HIGH (ref 65–99)
POTASSIUM: 5.1 mmol/L (ref 3.5–5.1)
Potassium: 7.1 mmol/L (ref 3.5–5.1)
SODIUM: 136 mmol/L (ref 135–145)
Sodium: 139 mmol/L (ref 135–145)

## 2017-03-13 LAB — I-STAT TROPONIN, ED
TROPONIN I, POC: 0.59 ng/mL — AB (ref 0.00–0.08)
Troponin i, poc: 0.45 ng/mL (ref 0.00–0.08)

## 2017-03-13 LAB — CBC
HEMATOCRIT: 34.1 % — AB (ref 36.0–46.0)
HEMOGLOBIN: 11 g/dL — AB (ref 12.0–15.0)
MCH: 30.2 pg (ref 26.0–34.0)
MCHC: 32.3 g/dL (ref 30.0–36.0)
MCV: 93.7 fL (ref 78.0–100.0)
Platelets: 164 10*3/uL (ref 150–400)
RBC: 3.64 MIL/uL — ABNORMAL LOW (ref 3.87–5.11)
RDW: 16.2 % — AB (ref 11.5–15.5)
WBC: 7.8 10*3/uL (ref 4.0–10.5)

## 2017-03-13 LAB — CBC WITH DIFFERENTIAL/PLATELET
Basophils Absolute: 0 10*3/uL (ref 0.0–0.1)
Basophils Relative: 0 %
EOS ABS: 0.1 10*3/uL (ref 0.0–0.7)
EOS PCT: 1 %
HCT: 31.5 % — ABNORMAL LOW (ref 36.0–46.0)
Hemoglobin: 9.8 g/dL — ABNORMAL LOW (ref 12.0–15.0)
LYMPHS ABS: 1 10*3/uL (ref 0.7–4.0)
LYMPHS PCT: 9 %
MCH: 29.6 pg (ref 26.0–34.0)
MCHC: 31.1 g/dL (ref 30.0–36.0)
MCV: 95.2 fL (ref 78.0–100.0)
MONO ABS: 0.7 10*3/uL (ref 0.1–1.0)
MONOS PCT: 6 %
Neutro Abs: 9.1 10*3/uL — ABNORMAL HIGH (ref 1.7–7.7)
Neutrophils Relative %: 84 %
PLATELETS: 150 10*3/uL (ref 150–400)
RBC: 3.31 MIL/uL — ABNORMAL LOW (ref 3.87–5.11)
RDW: 16.4 % — AB (ref 11.5–15.5)
WBC: 10.9 10*3/uL — ABNORMAL HIGH (ref 4.0–10.5)

## 2017-03-13 LAB — HCG, SERUM, QUALITATIVE: PREG SERUM: NEGATIVE

## 2017-03-13 LAB — CBG MONITORING, ED: GLUCOSE-CAPILLARY: 125 mg/dL — AB (ref 65–99)

## 2017-03-13 LAB — APTT: aPTT: 35 seconds (ref 24–36)

## 2017-03-13 LAB — PROTIME-INR
INR: 1.03
Prothrombin Time: 13.4 seconds (ref 11.4–15.2)

## 2017-03-13 LAB — POTASSIUM: POTASSIUM: 3.1 mmol/L — AB (ref 3.5–5.1)

## 2017-03-13 MED ORDER — MIDAZOLAM HCL 2 MG/2ML IJ SOLN
INTRAMUSCULAR | Status: AC
  Filled 2017-03-13: qty 4

## 2017-03-13 MED ORDER — ONDANSETRON HCL 4 MG PO TABS
4.0000 mg | ORAL_TABLET | Freq: Four times a day (QID) | ORAL | Status: DC | PRN
  Administered 2017-03-17: 4 mg via ORAL
  Filled 2017-03-13: qty 1

## 2017-03-13 MED ORDER — SODIUM CHLORIDE 0.9 % IV SOLN: 250.0000 mL | INTRAVENOUS | Status: DC | PRN

## 2017-03-13 MED ORDER — ALBUTEROL SULFATE (2.5 MG/3ML) 0.083% IN NEBU
10.0000 mg | INHALATION_SOLUTION | Freq: Once | RESPIRATORY_TRACT | Status: AC
  Administered 2017-03-13: 10 mg via RESPIRATORY_TRACT
  Filled 2017-03-13: qty 12

## 2017-03-13 MED ORDER — HEPARIN SODIUM (PORCINE) 1000 UNIT/ML IJ SOLN
INTRAMUSCULAR | Status: AC | PRN
  Administered 2017-03-13: 3.2 [IU] via INTRAVENOUS
  Administered 2017-03-13: 3000 [IU] via INTRAVENOUS

## 2017-03-13 MED ORDER — CLONIDINE HCL 0.1 MG PO TABS
0.1000 mg | ORAL_TABLET | ORAL | Status: AC
  Administered 2017-03-13: 0.1 mg via ORAL

## 2017-03-13 MED ORDER — SODIUM CHLORIDE 0.9% FLUSH
3.0000 mL | Freq: Two times a day (BID) | INTRAVENOUS | Status: DC
  Administered 2017-03-14 – 2017-03-17 (×2): 3 mL via INTRAVENOUS

## 2017-03-13 MED ORDER — ALTEPLASE 2 MG IJ SOLR
INTRAMUSCULAR | Status: AC | PRN
  Administered 2017-03-13: 4 mg

## 2017-03-13 MED ORDER — SODIUM CHLORIDE 0.9 % IV SOLN: 100.0000 mL | INTRAVENOUS | Status: DC | PRN

## 2017-03-13 MED ORDER — HYDROMORPHONE HCL 1 MG/ML IJ SOLN
1.0000 mg | Freq: Once | INTRAMUSCULAR | Status: AC
  Administered 2017-03-13: 1 mg via INTRAVENOUS
  Filled 2017-03-13: qty 1

## 2017-03-13 MED ORDER — FERRIC CITRATE 1 GM 210 MG(FE) PO TABS
630.0000 mg | ORAL_TABLET | Freq: Two times a day (BID) | ORAL | Status: DC
  Administered 2017-03-14 – 2017-03-19 (×11): 630 mg via ORAL
  Filled 2017-03-13 (×13): qty 3

## 2017-03-13 MED ORDER — FENTANYL CITRATE (PF) 100 MCG/2ML IJ SOLN
INTRAMUSCULAR | Status: AC
  Filled 2017-03-13: qty 2

## 2017-03-13 MED ORDER — SENNOSIDES-DOCUSATE SODIUM 8.6-50 MG PO TABS: 1.0000 | ORAL_TABLET | Freq: Every evening | ORAL | Status: DC | PRN

## 2017-03-13 MED ORDER — PROMETHAZINE HCL 25 MG/ML IJ SOLN
25.0000 mg | Freq: Once | INTRAMUSCULAR | Status: AC
  Administered 2017-03-13: 25 mg via INTRAVENOUS

## 2017-03-13 MED ORDER — NORTRIPTYLINE HCL 25 MG PO CAPS
50.0000 mg | ORAL_CAPSULE | Freq: Every day | ORAL | Status: DC
  Administered 2017-03-14 – 2017-03-18 (×5): 50 mg via ORAL
  Filled 2017-03-13 (×7): qty 2

## 2017-03-13 MED ORDER — INSULIN ASPART 100 UNIT/ML IV SOLN
10.0000 [IU] | Freq: Once | INTRAVENOUS | Status: DC
  Filled 2017-03-13: qty 0.1

## 2017-03-13 MED ORDER — ACETAMINOPHEN 325 MG PO TABS: 650.0000 mg | ORAL_TABLET | Freq: Four times a day (QID) | ORAL | Status: DC | PRN

## 2017-03-13 MED ORDER — HYDROCODONE-ACETAMINOPHEN 5-325 MG PO TABS
1.0000 | ORAL_TABLET | ORAL | Status: DC | PRN
  Administered 2017-03-14: 1 via ORAL
  Administered 2017-03-14 – 2017-03-15 (×3): 2 via ORAL
  Administered 2017-03-16: 1 via ORAL
  Administered 2017-03-16 – 2017-03-18 (×4): 2 via ORAL
  Filled 2017-03-13 (×4): qty 2
  Filled 2017-03-13 (×2): qty 1
  Filled 2017-03-13 (×2): qty 2

## 2017-03-13 MED ORDER — HYDROXYCHLOROQUINE SULFATE 200 MG PO TABS
200.0000 mg | ORAL_TABLET | Freq: Every day | ORAL | Status: DC
  Administered 2017-03-14 – 2017-03-19 (×6): 200 mg via ORAL
  Filled 2017-03-13 (×6): qty 1

## 2017-03-13 MED ORDER — HYDRALAZINE HCL 20 MG/ML IJ SOLN
10.0000 mg | INTRAMUSCULAR | Status: DC | PRN
  Administered 2017-03-16: 10 mg via INTRAVENOUS
  Filled 2017-03-13: qty 1

## 2017-03-13 MED ORDER — LABETALOL HCL 5 MG/ML IV SOLN
INTRAVENOUS | Status: AC | PRN
  Administered 2017-03-13: 5 mg via INTRAVENOUS
  Administered 2017-03-13: 10 mg via INTRAVENOUS

## 2017-03-13 MED ORDER — CALCIUM ACETATE (PHOS BINDER) 667 MG PO CAPS
667.0000 mg | ORAL_CAPSULE | Freq: Three times a day (TID) | ORAL | Status: DC
  Administered 2017-03-14 – 2017-03-19 (×15): 667 mg via ORAL
  Filled 2017-03-13 (×15): qty 1

## 2017-03-13 MED ORDER — PROMETHAZINE HCL 25 MG/ML IJ SOLN
INTRAMUSCULAR | Status: AC
  Filled 2017-03-13: qty 1

## 2017-03-13 MED ORDER — ALTEPLASE 2 MG IJ SOLR: 2.0000 mg | Freq: Once | INTRAMUSCULAR | Status: DC | PRN

## 2017-03-13 MED ORDER — ALTEPLASE 2 MG IJ SOLR
INTRAMUSCULAR | Status: AC
  Filled 2017-03-13: qty 2

## 2017-03-13 MED ORDER — CLONIDINE HCL 0.1 MG PO TABS
ORAL_TABLET | ORAL | Status: AC
  Filled 2017-03-13: qty 1

## 2017-03-13 MED ORDER — ONDANSETRON HCL 4 MG/2ML IJ SOLN
4.0000 mg | Freq: Four times a day (QID) | INTRAMUSCULAR | Status: DC | PRN
  Administered 2017-03-14: 4 mg via INTRAVENOUS
  Filled 2017-03-13: qty 2

## 2017-03-13 MED ORDER — IOPAMIDOL (ISOVUE-300) INJECTION 61%
INTRAVENOUS | Status: AC
  Administered 2017-03-13: 50 mL
  Filled 2017-03-13: qty 100

## 2017-03-13 MED ORDER — DEXTROSE 50 % IV SOLN: 1.0000 | Freq: Once | INTRAVENOUS | Status: DC

## 2017-03-13 MED ORDER — FENTANYL CITRATE (PF) 100 MCG/2ML IJ SOLN
INTRAMUSCULAR | Status: AC | PRN
  Administered 2017-03-13 (×8): 50 ug via INTRAVENOUS

## 2017-03-13 MED ORDER — FENTANYL CITRATE (PF) 100 MCG/2ML IJ SOLN
INTRAMUSCULAR | Status: AC
  Filled 2017-03-13: qty 4

## 2017-03-13 MED ORDER — SODIUM CHLORIDE 0.9% FLUSH
3.0000 mL | INTRAVENOUS | Status: DC | PRN
  Administered 2017-03-15: 3 mL via INTRAVENOUS
  Filled 2017-03-13: qty 3

## 2017-03-13 MED ORDER — SODIUM CHLORIDE 0.9 % IV SOLN: INTRAVENOUS | Status: DC

## 2017-03-13 MED ORDER — MIDAZOLAM HCL 2 MG/2ML IJ SOLN
INTRAMUSCULAR | Status: AC | PRN
  Administered 2017-03-13 (×7): 1 mg via INTRAVENOUS

## 2017-03-13 MED ORDER — ACETAMINOPHEN 650 MG RE SUPP: 650.0000 mg | Freq: Four times a day (QID) | RECTAL | Status: DC | PRN

## 2017-03-13 MED ORDER — LEVETIRACETAM 500 MG PO TABS
500.0000 mg | ORAL_TABLET | Freq: Two times a day (BID) | ORAL | Status: DC
  Administered 2017-03-14 – 2017-03-19 (×12): 500 mg via ORAL
  Filled 2017-03-13 (×12): qty 1

## 2017-03-13 MED ORDER — ONDANSETRON HCL 4 MG/2ML IJ SOLN
4.0000 mg | Freq: Once | INTRAMUSCULAR | Status: AC
  Administered 2017-03-13: 4 mg via INTRAVENOUS
  Filled 2017-03-13: qty 2

## 2017-03-13 MED ORDER — LABETALOL HCL 5 MG/ML IV SOLN
INTRAVENOUS | Status: AC
  Filled 2017-03-13: qty 4

## 2017-03-13 MED ORDER — HYDROMORPHONE HCL 1 MG/ML IJ SOLN
1.0000 mg | INTRAMUSCULAR | Status: DC | PRN
  Administered 2017-03-17 (×2): 1 mg via INTRAVENOUS
  Filled 2017-03-13 (×2): qty 1

## 2017-03-13 MED ORDER — MIDAZOLAM HCL 2 MG/2ML IJ SOLN
INTRAMUSCULAR | Status: AC
  Filled 2017-03-13: qty 2

## 2017-03-13 MED ORDER — CLONIDINE HCL 0.1 MG PO TABS
0.1000 mg | ORAL_TABLET | Freq: Once | ORAL | Status: AC
  Administered 2017-03-13: 0.1 mg via ORAL

## 2017-03-13 MED ORDER — OXYCODONE-ACETAMINOPHEN 5-325 MG PO TABS
1.0000 | ORAL_TABLET | Freq: Once | ORAL | Status: AC
  Administered 2017-03-13: 1 via ORAL

## 2017-03-13 MED ORDER — HEPARIN SODIUM (PORCINE) 5000 UNIT/ML IJ SOLN: 5000.0000 [IU] | Freq: Three times a day (TID) | INTRAMUSCULAR | Status: DC

## 2017-03-13 MED ORDER — HEPARIN SODIUM (PORCINE) 1000 UNIT/ML DIALYSIS: 1000.0000 [IU] | INTRAMUSCULAR | Status: DC | PRN

## 2017-03-13 MED ORDER — OXYCODONE-ACETAMINOPHEN 5-325 MG PO TABS
ORAL_TABLET | ORAL | Status: AC
  Filled 2017-03-13: qty 1

## 2017-03-13 MED ORDER — LIDOCAINE HCL 1 % IJ SOLN
INTRAMUSCULAR | Status: AC
  Filled 2017-03-13: qty 20

## 2017-03-13 MED ORDER — PENTAFLUOROPROP-TETRAFLUOROETH EX AERO: 1.0000 "application " | INHALATION_SPRAY | CUTANEOUS | Status: DC | PRN

## 2017-03-13 MED ORDER — HEPARIN SODIUM (PORCINE) 1000 UNIT/ML IJ SOLN
INTRAMUSCULAR | Status: AC
  Filled 2017-03-13: qty 1

## 2017-03-13 MED ORDER — LIDOCAINE-PRILOCAINE 2.5-2.5 % EX CREA: 1.0000 "application " | TOPICAL_CREAM | CUTANEOUS | Status: DC | PRN

## 2017-03-13 MED ORDER — SODIUM CHLORIDE 0.9% FLUSH
3.0000 mL | Freq: Two times a day (BID) | INTRAVENOUS | Status: DC
  Administered 2017-03-14 – 2017-03-18 (×6): 3 mL via INTRAVENOUS

## 2017-03-13 MED ORDER — LIDOCAINE HCL 1 % IJ SOLN
INTRAMUSCULAR | Status: AC | PRN
  Administered 2017-03-13: 8 mL
  Administered 2017-03-13: 10 mL

## 2017-03-13 MED ORDER — ONDANSETRON HCL 4 MG/2ML IJ SOLN
4.0000 mg | Freq: Once | INTRAMUSCULAR | Status: AC
  Administered 2017-03-13: 4 mg via INTRAVENOUS

## 2017-03-13 MED ORDER — SODIUM BICARBONATE 8.4 % IV SOLN
50.0000 meq | Freq: Once | INTRAVENOUS | Status: AC
  Administered 2017-03-13: 50 meq via INTRAVENOUS

## 2017-03-13 MED ORDER — LIDOCAINE HCL (PF) 1 % IJ SOLN: 5.0000 mL | INTRAMUSCULAR | Status: DC | PRN

## 2017-03-13 NOTE — Progress Notes (Signed)
Pt vomited first dose of clonidine. Dose repeated per order

## 2017-03-13 NOTE — Progress Notes (Signed)
Patient ID: Maria Vazquez, female   DOB: Feb 12, 1976, 41 y.o.   MRN: [id number]   Post left upper arm dialysis fistula thrombolysis  Unsuccessful Placed tunneled dialysis catheter  Pt in [place] with very high BP 184/138; 175/117 K 5.1 99% RA HR 70 Also complains of continued pain in Left upper arm (sleeping now)  Pt was hypertensive when arrived today- 164/111 Painful left arm upon arrival Is not Rx HTN meds   Spoke to D Zeyfang PA- Nephrology  Pt is to dialyze today in [place] Clonidine 0.1 mg po now No additional pain meds for now  See dialysis center as per schedule

## 2017-03-13 NOTE — ED Notes (Signed)
Report to Lorre Nick RN

## 2017-03-13 NOTE — ED Provider Notes (Addendum)
Please see previous physicians note regarding patient's presenting history and physical, initial ED course, and associated medical decision making.   41 year old female with end-stage renal disease on hemodialysis who presents with chest pain. I had missed dialysis yesterday do to a clotted AV fistula. She had left chest permacath placement today, during which she complained of severe chest pains.  BASIC metabolic panel concerning for hyperkalemia of 7.1. She also has elevated troponin of 0.5. This all seemed to be related to needing dialysis. Chest x-ray visualized and shows no pneumothorax or other acute cardiopulmonary processes. Her EKG does show some new T-wave inversions in 1 and aVL. Cardiology was consult by Dr. Eulis Foster regarding elevated troponin and chest pain, and they will continue to follow in the hospital. Dr. Ellyn Hack saw patient.  Temporizing measures for hyperkalemia given. Albuterol and insulin w/ glucose.  Nephrology has been consultation for emergent dialysis. Spoke with Dr. Augustin Coupe.  Discussed with Dr. Myna Hidalgo regarding admission.     CRITICAL CARE Performed by: Forde Dandy   Total critical care time: 31 minutes  Critical care time was exclusive of separately billable procedures and treating other patients.  Critical care was necessary to treat or prevent imminent or life-threatening deterioration.  Critical care was time spent personally by me on the following activities: development of treatment plan with patient and/or surrogate as well as nursing, discussions with consultants, evaluation of patient's response to treatment, examination of patient, obtaining history from patient or surrogate, ordering and performing treatments and interventions, ordering and review of laboratory studies, ordering and review of radiographic studies, pulse oximetry and re-evaluation of patient's condition.    Forde Dandy, MD 03/13/17 Si Raider    Forde Dandy, MD 03/13/17 Lurena Nida    Forde Dandy, MD 03/13/17 Dorthula Perfect

## 2017-03-13 NOTE — ED Notes (Signed)
Pt st's she is feeling better at this time

## 2017-03-13 NOTE — Consult Note (Addendum)
Reason for Consult: Hyperkalemia  Referring Physician: Dr. Myna Hidalgo  Chief Complaint: Chest pain  Assessment/Plan: 1. Hyperkalemia/ESRD with last HD on Tuesday. TTS @ [place]. 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 CP may be from the placement of the IJ cath + ?PE (large clot burden) + volume overload + pain. - Plan on HD with 1K bath for 1 hr and then 2 K bath. - Will reassess in AM to determine if she needs another treatment. - Will also monitor closely to determine if pt with significant PE; most likely pt never had great flows in the fistula with adherent thrombus + only a 50m PTA and shouldn't have a massive PE. 2. HTN - restart home meds; HD with UF will help. I don't know if the weights are accurate as her EDW is 74.5kg which she left at after HD on Tues. 3. SLE - stable. 4. Anemia - Mircera 720m q2weeks. 5. HPTH - Calcitriol 0.2530mTIW.  [facility] 03[facility]13[facility]17 [facility] was nauseous and we gave her Phenergan 39m29m x1. Decr UF goal to 1.5L (net of 1L) -> will incr as tolerated 1K bath for 1st hrs (decr to 300/500) then 2K    HPI: PatrShanaia Sieversan 41 y57. female ESRD TTS last HD Tues missing HD Thur when she was noted to have a  thrombosed rt BCF. She had an attempted thrombectomy by VIR of the rt BCF which established some intermittent flow but unfortunately the flow could not be maintained despite an outflow 8mm 52m + balloon maceration resulting in a LIJ TC successfully placed. Pt worsening control of her hypertension + CP and was then sent to the ED for evaluation. She was saturating well on 2/min of O2, BP 160/115. Currently the chest pain is much improved. She denies any recent h/o decr or limited exercise tolerance, no recent CP, no orthopnea. Currently she has mild dyspnea but no cough, hemoptysis or fevers.  ROS Pertinent items are noted in HPI.  Chemistry and CBC: Creatinine, Ser  Date/Time Value Ref Range  Status  03/13/2017 05:54 PM 12.96 (H) 0.44 - 1.00 mg/dL Final  03/13/2017 08:54 AM 12.42 (H) 0.44 - 1.00 mg/dL Final  02/14/2017 12:28 PM 3.75 (H) 0.44 - 1.00 mg/dL Final  07/04/2015 06:08 PM 6.85 (H) 0.44 - 1.00 mg/dL Final  01/13/2015 06:10 AM 9.37 (H) 0.44 - 1.00 mg/dL Final  01/12/2015 04:59 AM 6.32 (H) 0.44 - 1.00 mg/dL Final    Comment:    DELTA CHECK NOTED  01/11/2015 04:28 AM 9.76 (H) 0.44 - 1.00 mg/dL Final  01/10/2015 04:40 AM 7.10 (H) 0.44 - 1.00 mg/dL Final  08/19/2014 02:20 PM 4.32 (H) 0.50 - 1.10 mg/dL Final  02/07/2014 02:59 PM 5.50 (H) 0.50 - 1.10 mg/dL Final  01/07/2014 04:23 PM 3.55 (H) 0.50 - 1.10 mg/dL Final    Comment:    DELTA CHECK NOTED  01/07/2014 07:36 AM 7.64 (H) 0.50 - 1.10 mg/dL Final  01/06/2014 06:22 AM 4.64 (H) 0.50 - 1.10 mg/dL Final  01/05/2014 04:55 AM 9.71 (H) 0.50 - 1.10 mg/dL Final  03/19/2013 07:39 AM 7.21 (H) 0.50 - 1.10 mg/dL Final  01/23/2013 02:10 AM 4.68 (H) 0.50 - 1.10 mg/dL Final  12/05/2012 10:16 AM 6.10 (H) 0.50 - 1.10 mg/dL Final  11/29/2012 11:11 PM 10.51 (H) 0.50 - 1.10 mg/dL Final  08/16/2012 08:45 AM 13.35 (H) 0.50 - 1.10 mg/dL Final    Comment:  DELTA CHECK NOTED  08/14/2012 10:27 AM 8.60 (H) 0.50 - 1.10 mg/dL Final  06/11/2012 12:50 PM 8.31 (H) 0.50 - 1.10 mg/dL Final  04/12/2012 07:45 AM 7.50 (H) 0.50 - 1.10 mg/dL Final  03/14/2012 02:22 AM 5.78 (H) 0.50 - 1.10 mg/dL Final  03/14/2012 12:06 AM 4.80 (H) 0.50 - 1.10 mg/dL Final  12/08/2011 07:02 AM 8.70 (H) 0.50 - 1.10 mg/dL Final  07/07/2011 10:43 AM 8.00 (H) 0.50 - 1.10 mg/dL Final  03/22/2011 07:50 AM 4.62 (H) 0.50 - 1.10 mg/dL Final  03/21/2011 07:17 AM 6.43 (H) 0.50 - 1.10 mg/dL Final  03/21/2011 06:13 AM 6.41 (H) 0.50 - 1.10 mg/dL Final  03/20/2011 05:30 AM 4.41 (H) 0.50 - 1.10 mg/dL Final  03/19/2011 05:19 AM 6.12 (H) 0.50 - 1.10 mg/dL Final    Comment:    DELTA CHECK NOTED  03/18/2011 03:58 AM 3.86 (H) 0.50 - 1.10 mg/dL Final    Comment:    DELTA CHECK NOTED   03/17/2011 02:02 PM 6.91 (H) 0.50 - 1.10 mg/dL Final  03/17/2011 06:09 AM 6.38 (H) 0.50 - 1.10 mg/dL Final  03/16/2011 05:00 AM 4.50 (H) 0.50 - 1.10 mg/dL Final    Comment:    DELTA CHECK NOTED  03/15/2011 11:59 AM 7.98 (H) 0.50 - 1.10 mg/dL Final  03/15/2011 11:45 AM 8.13 (H) 0.50 - 1.10 mg/dL Final  03/14/2011 06:40 AM 6.28 (H) 0.50 - 1.10 mg/dL Final  03/13/2011 07:39 AM 7.92 (H) 0.50 - 1.10 mg/dL Final  03/12/2011 06:42 AM 7.66 (H) 0.50 - 1.10 mg/dL Final  03/11/2011 05:29 AM 8.77 (H) 0.50 - 1.10 mg/dL Final  03/10/2011 05:15 AM 7.29 (H) 0.50 - 1.10 mg/dL Final  03/09/2011 08:58 AM 5.28 (H) 0.50 - 1.10 mg/dL Final    Comment:    DIALYSIS  03/09/2011 05:29 AM 9.41 (H) 0.50 - 1.10 mg/dL Final  03/08/2011 03:44 AM 7.59 (H) 0.50 - 1.10 mg/dL Final  03/07/2011 04:25 AM 9.78 (H) 0.50 - 1.10 mg/dL Final  03/06/2011 05:25 AM 11.07 (H) 0.50 - 1.10 mg/dL Final  03/05/2011 04:17 AM 10.00 (H) 0.50 - 1.10 mg/dL Final  03/04/2011 04:55 AM 10.79 (H) 0.50 - 1.10 mg/dL Final  03/03/2011 07:55 AM 10.74 (H) 0.50 - 1.10 mg/dL Final  03/02/2011 10:52 PM 8.80 (H) 0.50 - 1.10 mg/dL Final  03/02/2011 09:49 AM 10.03 (H) 0.50 - 1.10 mg/dL Final    Recent Labs Lab 03/13/17 0854 03/13/17 1754  NA 139 136  K 5.1 7.1*  CL 100* 99*  CO2 24 19*  GLUCOSE 87 119*  BUN 53* 60*  CREATININE 12.42* 12.96*  CALCIUM 8.7* 8.3*    Recent Labs Lab 03/13/17 0854 03/13/17 1540  WBC 7.8 10.9*  NEUTROABS  --  9.1*  HGB 11.0* 9.8*  HCT 34.1* 31.5*  MCV 93.7 95.2  PLT 164 150   Liver Function Tests: No results for input(s): AST, ALT, ALKPHOS, BILITOT, PROT, ALBUMIN in the last 168 hours. No results for input(s): LIPASE, AMYLASE in the last 168 hours. No results for input(s): AMMONIA in the last 168 hours. Cardiac Enzymes: No results for input(s): CKTOTAL, CKMB, CKMBINDEX, TROPONINI in the last 168 hours. Iron Studies: No results for input(s): IRON, TIBC, TRANSFERRIN, FERRITIN in the last 72  hours. PT/INR: '@LABRCNTIP' (inr:5)  Xrays/Other Studies: ) Results for orders placed or performed during the hospital encounter of 03/13/17 (from the past 48 hour(s))  I-stat troponin, ED     Status: Abnormal   Collection Time: 03/13/17  3:32 PM  Result Value Ref  Range   Troponin i, poc 0.59 (HH) 0.00 - 0.08 ng/mL   Comment NOTIFIED PHYSICIAN    Comment 3            Comment: Due to the release kinetics of cTnI, a negative result within the first hours of the onset of symptoms does not rule out myocardial infarction with certainty. If myocardial infarction is still suspected, repeat the test at appropriate intervals.   CBC with Differential     Status: Abnormal   Collection Time: 03/13/17  3:40 PM  Result Value Ref Range   WBC 10.9 (H) 4.0 - 10.5 K/uL   RBC 3.31 (L) 3.87 - 5.11 MIL/uL   Hemoglobin 9.8 (L) 12.0 - 15.0 g/dL   HCT 31.5 (L) 36.0 - 46.0 %   MCV 95.2 78.0 - 100.0 fL   MCH 29.6 26.0 - 34.0 pg   MCHC 31.1 30.0 - 36.0 g/dL   RDW 16.4 (H) 11.5 - 15.5 %   Platelets 150 150 - 400 K/uL   Neutrophils Relative % 84 %   Neutro Abs 9.1 (H) 1.7 - 7.7 K/uL   Lymphocytes Relative 9 %   Lymphs Abs 1.0 0.7 - 4.0 K/uL   Monocytes Relative 6 %   Monocytes Absolute 0.7 0.1 - 1.0 K/uL   Eosinophils Relative 1 %   Eosinophils Absolute 0.1 0.0 - 0.7 K/uL   Basophils Relative 0 %   Basophils Absolute 0.0 0.0 - 0.1 K/uL  Basic metabolic panel     Status: Abnormal   Collection Time: 03/13/17  5:54 PM  Result Value Ref Range   Sodium 136 135 - 145 mmol/L   Potassium 7.1 (HH) 3.5 - 5.1 mmol/L    Comment: NO VISIBLE HEMOLYSIS CRITICAL RESULT CALLED TO, READ BACK BY AND VERIFIED WITH: K.FIELDS,RN [id number] 03/13/17 G.[id number]    Chloride 99 (L) 101 - 111 mmol/L   CO2 19 (L) 22 - 32 mmol/L   Glucose, Bld 119 (H) 65 - 99 mg/dL   BUN 60 (H) 6 - 20 mg/dL   Creatinine, Ser 12.96 (H) 0.44 - 1.00 mg/dL   Calcium 8.3 (L) 8.9 - 10.3 mg/dL   GFR calc non Af Amer 3 (L) >60 mL/min   GFR calc Af  Amer 4 (L) >60 mL/min    Comment: (NOTE) The eGFR has been calculated using the CKD EPI equation. This calculation has not been validated in all clinical situations. eGFR's persistently <60 mL/min signify possible Chronic Kidney Disease.    Anion gap 18 (H) 5 - 15  hCG, serum, qualitative     Status: None   Collection Time: 03/13/17  5:54 PM  Result Value Ref Range   Preg, Serum NEGATIVE NEGATIVE    Comment:        THE SENSITIVITY OF THIS METHODOLOGY IS >10 mIU/mL.   I-stat troponin, ED     Status: Abnormal   Collection Time: 03/13/17  6:08 PM  Result Value Ref Range   Troponin i, poc 0.45 (HH) 0.00 - 0.08 ng/mL   Comment NOTIFIED PHYSICIAN    Comment 3            Comment: Due to the release kinetics of cTnI, a negative result within the first hours of the onset of symptoms does not rule out myocardial infarction with certainty. If myocardial infarction is still suspected, repeat the test at appropriate intervals.   CBG monitoring, ED     Status: Abnormal   Collection Time: 03/13/17  7:41 PM  Result  Value Ref Range   Glucose-Capillary 125 (H) 65 - 99 mg/dL   Ir Fluoro Guide Cv Line Left  Result Date: 03/13/2017 CLINICAL DATA:  End-stage renal disease with occluded hemodialysis fistula which could not be successfully treated with percutaneous techniques. Intermediate term dialysis access is therefore needed. EXAM: TUNNELED HEMODIALYSIS CATHETER PLACEMENT WITH ULTRASOUND AND FLUOROSCOPIC GUIDANCE TECHNIQUE: The procedure, risks, benefits, and alternatives were explained to the patient with the aid of interpreter. Questions regarding the procedure were encouraged and answered. The patient understands and consents to the procedure. Patency of the right IJ vein could not be confirmed on ultrasound. Patency of the left IJ vein was confirmed with ultrasound with image documentation. An appropriate skin site was determined. Region was prepped using maximum barrier technique including cap  and mask, sterile gown, sterile gloves, large sterile sheet, and Chlorhexidine as cutaneous antisepsis. The region was infiltrated locally with 1% lidocaine. Intravenous Fentanyl and Versed were administered as conscious sedation during continuous monitoring of the patient's level of consciousness and physiological / cardiorespiratory status by the radiology RN, with a total moderate sedation time of 92 minutes including previous attempted declot. Under real-time ultrasound guidance, the left IJ vein was accessed with a 21 gauge micropuncture needle; the needle tip within the vein was confirmed with ultrasound image documentation. Needle exchanged over the 018 guidewire for transitional dilator, which allowed advancement of a Benson wire into the IVC. Over this, an MPA catheter was advanced. A Hemosplit 19 hemodialysis catheter was tunneled from the left anterior chest wall approach to the left IJ dermatotomy site. The MPA catheter was exchanged over an Amplatz wire for serial vascular dilators which allow placement of a peel-away sheath, through which the catheter was advanced under intermittent fluoroscopy, positioned with its tips in the proximal and midright atrium. Spot chest radiograph confirms good catheter position. No pneumothorax. Catheter was flushed and primed per protocol. Catheter secured externally with O Prolene sutures. The left IJ dermatotomy site was closed with Dermabond. COMPLICATIONS: COMPLICATIONS None immediate FLUOROSCOPY TIME:  10 minutes 24 seconds, 27 mGy COMPARISON:  None IMPRESSION: 1. Technically successful placement of tunneled left IJ hemodialysis catheter with ultrasound and fluoroscopic guidance. Ready for routine use. ACCESS: Remains approachable for percutaneous intervention as needed. Electronically Signed   By: Lucrezia Europe M.D.   On: 03/13/2017 13:34   Ir US Guide Vasc Access Left  Result Date: 03/13/2017 CLINICAL DATA:  End-stage renal disease with occluded hemodialysis  fistula which could not be successfully treated with percutaneous techniques. Intermediate term dialysis access is therefore needed. EXAM: TUNNELED HEMODIALYSIS CATHETER PLACEMENT WITH ULTRASOUND AND FLUOROSCOPIC GUIDANCE TECHNIQUE: The procedure, risks, benefits, and alternatives were explained to the patient with the aid of interpreter. Questions regarding the procedure were encouraged and answered. The patient understands and consents to the procedure. Patency of the right IJ vein could not be confirmed on ultrasound. Patency of the left IJ vein was confirmed with ultrasound with image documentation. An appropriate skin site was determined. Region was prepped using maximum barrier technique including cap and mask, sterile gown, sterile gloves, large sterile sheet, and Chlorhexidine as cutaneous antisepsis. The region was infiltrated locally with 1% lidocaine. Intravenous Fentanyl and Versed were administered as conscious sedation during continuous monitoring of the patient's level of consciousness and physiological / cardiorespiratory status by the radiology RN, with a total moderate sedation time of 92 minutes including previous attempted declot. Under real-time ultrasound guidance, the left IJ vein was accessed with a 21 gauge micropuncture needle; the  needle tip within the vein was confirmed with ultrasound image documentation. Needle exchanged over the 018 guidewire for transitional dilator, which allowed advancement of a Benson wire into the IVC. Over this, an MPA catheter was advanced. A Hemosplit 19 hemodialysis catheter was tunneled from the left anterior chest wall approach to the left IJ dermatotomy site. The MPA catheter was exchanged over an Amplatz wire for serial vascular dilators which allow placement of a peel-away sheath, through which the catheter was advanced under intermittent fluoroscopy, positioned with its tips in the proximal and midright atrium. Spot chest radiograph confirms good  catheter position. No pneumothorax. Catheter was flushed and primed per protocol. Catheter secured externally with O Prolene sutures. The left IJ dermatotomy site was closed with Dermabond. COMPLICATIONS: COMPLICATIONS None immediate FLUOROSCOPY TIME:  10 minutes 24 seconds, 27 mGy COMPARISON:  None IMPRESSION: 1. Technically successful placement of tunneled left IJ hemodialysis catheter with ultrasound and fluoroscopic guidance. Ready for routine use. ACCESS: Remains approachable for percutaneous intervention as needed. Electronically Signed   By: Lucrezia Europe M.D.   On: 03/13/2017 13:34   Ir US Guide Vasc Access Right  Result Date: 03/13/2017 CLINICAL DATA:  Occluded right upper arm hemodialysis fistula. EXAM: DIALYSIS FISTULA DECLOT (ATTEMPTED) VENOUS ANGIOPLASTY ULTRASOUND GUIDANCE FOR VASCULAR ACCESS FLUOROSCOPY TIME:  10 minutes 24 seconds, 27 mgy TECHNIQUE: The procedure, risks (including but not limited to bleeding, infection, organ damage ), benefits, and alternatives were explained to the patient with the aid of interpreter. Questions regarding the procedure were encouraged and answered. The patient understands and consents to the procedure. Intravenous Fentanyl and Versed were administered as conscious sedation during continuous monitoring of the patient's level of consciousness and physiological / cardiorespiratory status by the radiology RN, with a total moderate sedation time of 92 minutes. The venous outflow just central to the fistula was accessed antegrade with 21-gauge micropuncture needle under real-time ultrasonic guidance after the overlying skin prepped with Betadine, draped in usual sterile fashion, infiltrated locally with 1%lidocaine. Needle exchanged over 018guidewire for transitional dilator through which 4 mg t-PA was administered. Ultrasound images were stored. Through the antegrade dilator, a Bentson wire was advanced . Over this a 11F sheath was placed, through which a 5 [place] Kumpe  catheter was advanced for outflow venography. This showed patency of the central venous system through the SVC . A short segment high-grade stenosis in the cephalic arch just peripheral to its confluence with the axillary vein was identified. 3000 units heparin were administered IV. The Kumpe was exchanged over guide wire for the AngioJet device, used to mechanically thrombolyse the clot in the outflow vein and remove the thrombus from the native outflow. This was exchanged for a 22m x 4 cm Mustang angioplasty balloon, advanced to the outflow vein. Angioplasty of the outflow vein and tandem venous stenoses was performed. Injection showed partial clearance of thrombus from the outflow vein with antegrade flow. No extravasation or apparent complication of angioplasty. The Angiojet catheter was then advanced again 2 further extract partially occlusive thrombus still in the aneurysmal segments of the outflow vein. Despite multiple passes with intermittent follow-up venography, good antegrade flow through the outflow vein could not be achieved. In fact, there is re- configuration of thrombus such that there is near occlusion of the outflow vein in the aneurysmal segment just proximal to the return to more normal caliber centrally. Once the physiologic limited the Angiojet performance had been reached, the procedure was terminated. The catheter and sheath then removed and hemostasis  achieved with 2-0 Ethilon suture . Patient tolerated procedure well. IMPRESSION: 1. Unsuccessful declot of right brachiocephalic fistula secondary to large amount of occlusive thrombus in the aneurysmal segment of the outflow cephalic vein. 2. Technically successful balloon angioplasty of central outflow venous stenosis. ACCESS: Plan for tunneled hemodialysis catheter placement to follow. Will need surgical fistula revision/ replacement. Electronically Signed   By: Lucrezia Europe M.D.   On: 03/13/2017 13:31   Dg Chest Port 1 View  Result Date:  03/13/2017 CLINICAL DATA:  Chest pain and shortness of breath EXAM: PORTABLE CHEST 1 VIEW COMPARISON:  02/14/2017 FINDINGS: Left-sided central venous catheter tip overlies the proximal right atrium. No pneumothorax. No acute consolidation or effusion. Borderline cardiomegaly. IMPRESSION: 1. Left-sided central venous catheter tip overlies the right atrium 2. Mildly low lung volumes.  Negative for edema or infiltrate. Electronically Signed   By: Donavan Foil M.D.   On: 03/13/2017 15:51   Ir Thrombectomy Av Fistula/w Thrombolysis/pta Inc Shunt/img Right  Result Date: 03/13/2017 CLINICAL DATA:  Occluded right upper arm hemodialysis fistula. EXAM: DIALYSIS FISTULA DECLOT (ATTEMPTED) VENOUS ANGIOPLASTY ULTRASOUND GUIDANCE FOR VASCULAR ACCESS FLUOROSCOPY TIME:  10 minutes 24 seconds, 27 mgy TECHNIQUE: The procedure, risks (including but not limited to bleeding, infection, organ damage ), benefits, and alternatives were explained to the patient with the aid of interpreter. Questions regarding the procedure were encouraged and answered. The patient understands and consents to the procedure. Intravenous Fentanyl and Versed were administered as conscious sedation during continuous monitoring of the patient's level of consciousness and physiological / cardiorespiratory status by the radiology RN, with a total moderate sedation time of 92 minutes. The venous outflow just central to the fistula was accessed antegrade with 21-gauge micropuncture needle under real-time ultrasonic guidance after the overlying skin prepped with Betadine, draped in usual sterile fashion, infiltrated locally with 1%lidocaine. Needle exchanged over 018guidewire for transitional dilator through which 4 mg t-PA was administered. Ultrasound images were stored. Through the antegrade dilator, a Bentson wire was advanced . Over this a 72F sheath was placed, through which a 5 [place] Kumpe catheter was advanced for outflow venography. This showed patency  of the central venous system through the SVC . A short segment high-grade stenosis in the cephalic arch just peripheral to its confluence with the axillary vein was identified. 3000 units heparin were administered IV. The Kumpe was exchanged over guide wire for the AngioJet device, used to mechanically thrombolyse the clot in the outflow vein and remove the thrombus from the native outflow. This was exchanged for a 42m x 4 cm Mustang angioplasty balloon, advanced to the outflow vein. Angioplasty of the outflow vein and tandem venous stenoses was performed. Injection showed partial clearance of thrombus from the outflow vein with antegrade flow. No extravasation or apparent complication of angioplasty. The Angiojet catheter was then advanced again 2 further extract partially occlusive thrombus still in the aneurysmal segments of the outflow vein. Despite multiple passes with intermittent follow-up venography, good antegrade flow through the outflow vein could not be achieved. In fact, there is re- configuration of thrombus such that there is near occlusion of the outflow vein in the aneurysmal segment just proximal to the return to more normal caliber centrally. Once the physiologic limited the Angiojet performance had been reached, the procedure was terminated. The catheter and sheath then removed and hemostasis achieved with 2-0 Ethilon suture . Patient tolerated procedure well. IMPRESSION: 1. Unsuccessful declot of right brachiocephalic fistula secondary to large amount of occlusive thrombus in the  aneurysmal segment of the outflow cephalic vein. 2. Technically successful balloon angioplasty of central outflow venous stenosis. ACCESS: Plan for tunneled hemodialysis catheter placement to follow. Will need surgical fistula revision/ replacement. Electronically Signed   By: Lucrezia Europe M.D.   On: 03/13/2017 13:31    PMH:   Past Medical History:  Diagnosis Date  . Anemia   . Blood transfusion   . Cervix  carcinoma in situ Mar 2011  . CHF (congestive heart failure) ([place])   . Chronic kidney disease    T, Th, Sat  . Dialysis patient [facility])    tues, thursday, saturday.  . Gallstone pancreatitis Feb 2011  . Hypertension   . Lupus   . Lupus nephritis ([place])   . Pneumonia due to organism 03/02/2011  . Proteinuria - cause not known   . Renal failure, acute on chronic (HCC)   . Renal insufficiency   . Seizure disorder ([place]) 03/19/2013  . Thrombocytopenia (HCC)     PSH:   Past Surgical History:  Procedure Laterality Date  . APPENDECTOMY    . AV FISTULA PLACEMENT    . AV FISTULA PLACEMENT  07/18/2011   Procedure: ARTERIOVENOUS (AV) FISTULA CREATION;  Surgeon: Angelia Mould, MD;  Location: [place];  Service: Vascular;  Laterality: Right;  . AV FISTULA PLACEMENT  01/23/12   Revision of Right AVF  . CERVICAL CONE BIOPSY    . CESAREAN SECTION    . CHOLECYSTECTOMY    . EMBOLECTOMY  10/10/2011   Procedure: EMBOLECTOMY;  Surgeon: Angelia Mould, MD;  Location: [place];  Service: Vascular;  Laterality: Right;  . FISTULOGRAM Right 04/12/2012   Procedure: FISTULOGRAM;  Surgeon: Angelia Mould, MD;  Location: [facility] CATH LAB;  Service: Cardiovascular;  Laterality: Right;  . IR FLUORO GUIDE CV LINE LEFT  03/13/2017  . IR THROMBECTOMY AV FISTULA W/THROMBOLYSIS/PTA INC/SHUNT/IMG RIGHT Right 03/13/2017  . IR US GUIDE VASC ACCESS LEFT  03/13/2017  . IR US GUIDE VASC ACCESS RIGHT  03/13/2017  . SHUNTOGRAM Right 07/07/2011   Procedure: SHUNTOGRAM;  Surgeon: Angelia Mould, MD;  Location: [facility] CATH LAB;  Service: Cardiovascular;  Laterality: Right;  . SHUNTOGRAM N/A 12/08/2011   Procedure: Earney Mallet;  Surgeon: Angelia Mould, MD;  [facility];  Service: Cardiovascular;  Laterality: N/A;    Allergies: No Known Allergies  Medications:   Prior to Admission medications   Medication Sig Start Date End Date Taking? Authorizing Provider  acetaminophen (TYLENOL) 500 MG tablet  Take 500 mg by mouth every 6 (six) hours as needed for mild pain or moderate pain.   Yes [provider]  calcium acetate (PHOSLO) 667 MG capsule Take 667 mg by mouth 3 (three) times daily with meals.    Yes [provider]  ferric citrate (AURYXIA) 1 GM 210 MG(Fe) tablet Take 630 mg by mouth 2 (two) times daily.   Yes [provider]  hydroxychloroquine (PLAQUENIL) 200 MG tablet Take 1 tablet (200 mg total) by mouth daily. 01/13/15  Yes Kathie Dike, MD  levETIRAcetam (KEPPRA) 500 MG tablet Take 500 mg by mouth See admin instructions. Pt to take 1 tablet twice daily - and to take an additional tablet after dialysis on Tues Thurs and Sat   Yes [provider]  nortriptyline (PAMELOR) 50 MG capsule Take 50 mg by mouth daily.   Yes [provider]    Discontinued Meds:   Medications Discontinued During This Encounter  Medication Reason  . 0.9 %  sodium chloride infusion     Social History:  reports that she has never smoked. She has never used smokeless tobacco. She reports that she does not drink alcohol or use drugs.  Family History:   Family History  Problem Relation Age of Onset  . Diabetes Mother   . Diabetes Brother   . Diabetes Brother   . Diabetes Maternal Grandmother   . Anesthesia problems Neg Hx     Blood pressure (!) 165/117, pulse 70, resp. rate 12, height '5\' 6"'  (1.676 m), weight 68 kg (150 lb), last menstrual period 02/11/2017, SpO2 100 %. General appearance: alert, cooperative and appears stated age Head: Normocephalic, without obvious abnormality, atraumatic Eyes: negative Neck: no adenopathy, no carotid bruit, supple, symmetrical, trachea midline and thyroid not enlarged, symmetric, no tenderness/mass/nodules Back: symmetric, no curvature. ROM normal. No CVA tenderness. Resp: clear to auscultation bilaterally Chest wall: no tenderness Cardio: regular rate and rhythm, S1, S2 normal, no murmur, click, rub or gallop GI:  soft, non-tender; bowel sounds normal; no masses,  no organomegaly Extremities: extremities normal, atraumatic, no cyanosis or edema Pulses: 2+ and symmetric Skin: Skin color, texture, turgor normal. No rashes or lesions Lymph nodes: Cervical, supraclavicular, and axillary nodes normal. Neurologic: Grossly normal  HD Access: very weak bruit in the inflow segment of fistula and outflow is pulsatile as well + LIJ TC       Elmore Hyslop, Hunt Oris, MD 03/13/2017, 8:03 PM

## 2017-03-13 NOTE — ED Notes (Signed)
Port chest x-ray being done at this time

## 2017-03-13 NOTE — Progress Notes (Signed)
Waiting for orders from renal PA to address BP.

## 2017-03-13 NOTE — Progress Notes (Signed)
Pt started bleeding from right arm fistula.  Pressure held and PA notified.  Dr. Vernard Gambles Shanon Brow Zaphang PA/Dr. Deterding notified.  Instructed to transport pt. To ED for admission to hospital.  PO Clonidine repeated due to vomiting.  Pt cleaned up and transported via bed to ED per Elmer Ramp, RN.

## 2017-03-13 NOTE — ED Notes (Signed)
Potassium level 7.1  Dr. Ellyn Hack made aware

## 2017-03-13 NOTE — Progress Notes (Signed)
Pt arrived to short stay area very sleepy but arousable. C/O pain.  BP elevated. Jannifer Franklin, PA notified.  Right arm fistula appears as preop. Dressing to chest left dry and intact.

## 2017-03-13 NOTE — Sedation Documentation (Signed)
Declot not successful.  Dr. Vernard Gambles to place a HD cath.

## 2017-03-13 NOTE — Consult Note (Signed)
Cardiology Admission History and Physical:   Patient ID: Maria Vazquez; MRN: [id number]; DOB: [birth year]   Admission date: 03/13/2017  Primary Care Provider: Estanislado Emms, MD Primary Cardiologist: New  Primary Electrophysiologist:  None  Chief Complaint:  Chest pain, + troponin   Patient Profile:   Maria Vazquez is a 41 y.o. female with a history of ESRD on HD, HTN, Lupus, and chronic anemia who is being seen for an evaluation of elevated troponin at the request of Dr. Eulis Foster. She presented to the ED with nausea, elevated BP.   History of Present Illness:  HPI obtained with the help of interpretor.  Maria Vazquez is a 41 year old female with a past medical history of ESRD on HD, HTN, and Lupus. She presented to short stay today with plans for venous angioplasty and declot AV fistula on R arm. Procedure was unsuccessful and she underwent LIJ HD catheter placement for HD access. Post procedure and inter-operatively was noted to be hypertensive with BP's 184/138, 175/117. She was given po clonidine and then vomited and clonidine was noted in vomitus.   She was transferred to the ED where EKG showed NSR with some non-specific ST changes in the lateral leads. Troponin was 0.5. [name] tells me that she did have mild chest discomfort today post catheter placement at the site of insertion. She denies associated SOB, diaphoresis. She has never had any exertional chest pain. She does say that her right arm hurt last week but that was after she was stuck 4 times in attempt to initiate HD in the AV fistula that was clotted.   She has never been told that she has high BP. Usually BP is low. She denies dizziness, headaches, blurred vision. Continues to feel nauseous, family notes lethargy. Of note, her last HD treatment was on Tuesday, she is on Tues/Thurs/Sat. Schedule.    Past Medical History:  Diagnosis Date  . Anemia   . Blood transfusion   . Cervix carcinoma  in situ Mar 2011  . CHF (congestive heart failure) ([place])   . Chronic kidney disease    T, Th, Sat  . Dialysis patient [facility])    tues, thursday, saturday.  . Gallstone pancreatitis Feb 2011  . Hypertension   . Lupus   . Lupus nephritis ([place])   . Pneumonia due to organism 03/02/2011  . Proteinuria - cause not known   . Renal failure, acute on chronic (HCC)   . Renal insufficiency   . Seizure disorder ([place]) 03/19/2013  . Thrombocytopenia ([place])     Past Surgical History:  Procedure Laterality Date  . APPENDECTOMY    . AV FISTULA PLACEMENT    . AV FISTULA PLACEMENT  07/18/2011   Procedure: ARTERIOVENOUS (AV) FISTULA CREATION;  Surgeon: Angelia Mould, MD;  Location: [place];  Service: Vascular;  Laterality: Right;  . AV FISTULA PLACEMENT  01/23/12   Revision of Right AVF  . CERVICAL CONE BIOPSY    . CESAREAN SECTION    . CHOLECYSTECTOMY    . EMBOLECTOMY  10/10/2011   Procedure: EMBOLECTOMY;  Surgeon: Angelia Mould, MD;  Location: [place];  Service: Vascular;  Laterality: Right;  . FISTULOGRAM Right 04/12/2012   Procedure: FISTULOGRAM;  Surgeon: Angelia Mould, MD;  Location: [facility] CATH LAB;  Service: Cardiovascular;  Laterality: Right;  . IR FLUORO GUIDE CV LINE LEFT  03/13/2017  . IR THROMBECTOMY AV FISTULA W/THROMBOLYSIS/PTA INC/SHUNT/IMG RIGHT Right 03/13/2017  . IR US GUIDE VASC ACCESS LEFT  03/13/2017  . IR US GUIDE VASC ACCESS RIGHT  03/13/2017  . SHUNTOGRAM Right 07/07/2011   Procedure: SHUNTOGRAM;  Surgeon: Angelia Mould, MD;  Location: [facility] CATH LAB;  Service: Cardiovascular;  Laterality: Right;  . SHUNTOGRAM N/A 12/08/2011   Procedure: Earney Mallet;  Surgeon: Angelia Mould, MD;  Location: [facility] CATH LAB;  Service: Cardiovascular;  Laterality: N/A;     Medications Prior to Admission: Prior to Admission medications   Medication Sig Start Date End Date Taking? Authorizing Provider  acetaminophen (TYLENOL) 500 MG tablet Take 500 mg by mouth every 6 (six)  hours as needed for mild pain or moderate pain.   Yes [provider]  calcium acetate (PHOSLO) 667 MG capsule Take 667 mg by mouth 3 (three) times daily with meals.    Yes [provider]  ferric citrate (AURYXIA) 1 GM 210 MG(Fe) tablet Take 630 mg by mouth 2 (two) times daily.   Yes [provider]  hydroxychloroquine (PLAQUENIL) 200 MG tablet Take 1 tablet (200 mg total) by mouth daily. 01/13/15  Yes Kathie Dike, MD  levETIRAcetam (KEPPRA) 500 MG tablet Take 500 mg by mouth See admin instructions. Pt to take 1 tablet twice daily - and to take an additional tablet after dialysis on Tues Thurs and Sat   Yes [provider]  nortriptyline (PAMELOR) 50 MG capsule Take 50 mg by mouth daily.   Yes [provider]     Allergies:   No Known Allergies  Social History:   Social History   Social History  . Marital status: Married    Spouse name: N/A  . Number of children: N/A  . Years of education: N/A   Occupational History  . Not on file.   Social History Main Topics  . Smoking status: Never Smoker  . Smokeless tobacco: Never Used  . Alcohol use No  . Drug use: No  . Sexual activity: Yes    Birth control/ protection: Condom, None   Other Topics Concern  . Not on file   Social History Narrative  . No narrative on file    Family History:   The patient's family history includes Diabetes in her brother, brother, maternal grandmother, and mother. There is no history of Anesthesia problems.    ROS:  Please see the history of present illness.  All other ROS reviewed and negative.     Physical Exam/Data:   Vitals:   03/13/17 1448 03/13/17 1456 03/13/17 1500 03/13/17 1515  BP:  (!) 162/117 (!) 166/126 (!) 167/122  Pulse:  70 66 73  Resp:  15 14 19   SpO2:  100% 100% 100%  Weight: 150 lb (68 kg)     Height: 5\' 6"  (1.676 m)      No intake or output data in the 24 hours ending 03/13/17 1704 Filed Weights   03/13/17 1448  Weight:  150 lb (68 kg)   Body mass index is 24.21 kg/m.  General:  Well nourished, well developed, in no acute distress HEENT: normal Lymph: no adenopathy Neck: 8-9 cm JVD Endocrine:  No thryomegaly Vascular: No carotid bruits; FA pulses 2+ bilaterally without bruits  Cardiac:  normal S1, S2; RRR; no murmur  Lungs:  clear to auscultation bilaterally, no wheezing, rhonchi or rales  Abd: soft, nontender, no hepatomegaly  Ext: no edema Musculoskeletal:  No deformities, BUE and BLE strength normal and equal Skin: warm and dry  Neuro:  CNs 2-12 intact, no focal abnormalities noted Psych:  Normal affect  EKG:  The ECG that was done 03/13/17 was personally reviewed and demonstrates NSR.   Relevant CV Studies:   Laboratory Data:  Chemistry Recent Labs Lab 03/13/17 0854  NA 139  K 5.1  CL 100*  CO2 24  GLUCOSE 87  BUN 53*  CREATININE 12.42*  CALCIUM 8.7*  GFRNONAA 3*  GFRAA 4*  ANIONGAP 15    No results for input(s): PROT, ALBUMIN, AST, ALT, ALKPHOS, BILITOT in the last 168 hours. Hematology Recent Labs Lab 03/13/17 0854 03/13/17 1540  WBC 7.8 10.9*  RBC 3.64* 3.31*  HGB 11.0* 9.8*  HCT 34.1* 31.5*  MCV 93.7 95.2  MCH 30.2 29.6  MCHC 32.3 31.1  RDW 16.2* 16.4*  PLT 164 150   Cardiac EnzymesNo results for input(s): TROPONINI in the last 168 hours.  Recent Labs Lab 03/13/17 1532  TROPIPOC 0.59*    BNPNo results for input(s): BNP, PROBNP in the last 168 hours.  DDimer No results for input(s): DDIMER in the last 168 hours.  Radiology/Studies:  Ir Fluoro Guide Cv Line Left  Result Date: 03/13/2017 CLINICAL DATA:  End-stage renal disease with occluded hemodialysis fistula which could not be successfully treated with percutaneous techniques. Intermediate term dialysis access is therefore needed. EXAM: TUNNELED HEMODIALYSIS CATHETER PLACEMENT WITH ULTRASOUND AND FLUOROSCOPIC GUIDANCE TECHNIQUE: The procedure, risks, benefits, and alternatives were explained to the  patient with the aid of interpreter. Questions regarding the procedure were encouraged and answered. The patient understands and consents to the procedure. Patency of the right IJ vein could not be confirmed on ultrasound. Patency of the left IJ vein was confirmed with ultrasound with image documentation. An appropriate skin site was determined. Region was prepped using maximum barrier technique including cap and mask, sterile gown, sterile gloves, large sterile sheet, and Chlorhexidine as cutaneous antisepsis. The region was infiltrated locally with 1% lidocaine. Intravenous Fentanyl and Versed were administered as conscious sedation during continuous monitoring of the patient's level of consciousness and physiological / cardiorespiratory status by the radiology RN, with a total moderate sedation time of 92 minutes including previous attempted declot. Under real-time ultrasound guidance, the left IJ vein was accessed with a 21 gauge micropuncture needle; the needle tip within the vein was confirmed with ultrasound image documentation. Needle exchanged over the 018 guidewire for transitional dilator, which allowed advancement of a Benson wire into the IVC. Over this, an MPA catheter was advanced. A Hemosplit 19 hemodialysis catheter was tunneled from the left anterior chest wall approach to the left IJ dermatotomy site. The MPA catheter was exchanged over an Amplatz wire for serial vascular dilators which allow placement of a peel-away sheath, through which the catheter was advanced under intermittent fluoroscopy, positioned with its tips in the proximal and midright atrium. Spot chest radiograph confirms good catheter position. No pneumothorax. Catheter was flushed and primed per protocol. Catheter secured externally with O Prolene sutures. The left IJ dermatotomy site was closed with Dermabond. COMPLICATIONS: COMPLICATIONS None immediate FLUOROSCOPY TIME:  10 minutes 24 seconds, 27 mGy COMPARISON:  None  IMPRESSION: 1. Technically successful placement of tunneled left IJ hemodialysis catheter with ultrasound and fluoroscopic guidance. Ready for routine use. ACCESS: Remains approachable for percutaneous intervention as needed. Electronically Signed   By: Lucrezia Europe M.D.   On: 03/13/2017 13:34   Ir US Guide Vasc Access Left  Result Date: 03/13/2017 CLINICAL DATA:  End-stage renal disease with occluded hemodialysis fistula which could not be successfully treated with percutaneous techniques. Intermediate term dialysis access is therefore needed.  EXAM: TUNNELED HEMODIALYSIS CATHETER PLACEMENT WITH ULTRASOUND AND FLUOROSCOPIC GUIDANCE TECHNIQUE: The procedure, risks, benefits, and alternatives were explained to the patient with the aid of interpreter. Questions regarding the procedure were encouraged and answered. The patient understands and consents to the procedure. Patency of the right IJ vein could not be confirmed on ultrasound. Patency of the left IJ vein was confirmed with ultrasound with image documentation. An appropriate skin site was determined. Region was prepped using maximum barrier technique including cap and mask, sterile gown, sterile gloves, large sterile sheet, and Chlorhexidine as cutaneous antisepsis. The region was infiltrated locally with 1% lidocaine. Intravenous Fentanyl and Versed were administered as conscious sedation during continuous monitoring of the patient's level of consciousness and physiological / cardiorespiratory status by the radiology RN, with a total moderate sedation time of 92 minutes including previous attempted declot. Under real-time ultrasound guidance, the left IJ vein was accessed with a 21 gauge micropuncture needle; the needle tip within the vein was confirmed with ultrasound image documentation. Needle exchanged over the 018 guidewire for transitional dilator, which allowed advancement of a Benson wire into the IVC. Over this, an MPA catheter was advanced. A  Hemosplit 19 hemodialysis catheter was tunneled from the left anterior chest wall approach to the left IJ dermatotomy site. The MPA catheter was exchanged over an Amplatz wire for serial vascular dilators which allow placement of a peel-away sheath, through which the catheter was advanced under intermittent fluoroscopy, positioned with its tips in the proximal and midright atrium. Spot chest radiograph confirms good catheter position. No pneumothorax. Catheter was flushed and primed per protocol. Catheter secured externally with O Prolene sutures. The left IJ dermatotomy site was closed with Dermabond. COMPLICATIONS: COMPLICATIONS None immediate FLUOROSCOPY TIME:  10 minutes 24 seconds, 27 mGy COMPARISON:  None IMPRESSION: 1. Technically successful placement of tunneled left IJ hemodialysis catheter with ultrasound and fluoroscopic guidance. Ready for routine use. ACCESS: Remains approachable for percutaneous intervention as needed. Electronically Signed   By: Lucrezia Europe M.D.   On: 03/13/2017 13:34   Ir US Guide Vasc Access Right  Result Date: 03/13/2017 CLINICAL DATA:  Occluded right upper arm hemodialysis fistula. EXAM: DIALYSIS FISTULA DECLOT (ATTEMPTED) VENOUS ANGIOPLASTY ULTRASOUND GUIDANCE FOR VASCULAR ACCESS FLUOROSCOPY TIME:  10 minutes 24 seconds, 27 mgy TECHNIQUE: The procedure, risks (including but not limited to bleeding, infection, organ damage ), benefits, and alternatives were explained to the patient with the aid of interpreter. Questions regarding the procedure were encouraged and answered. The patient understands and consents to the procedure. Intravenous Fentanyl and Versed were administered as conscious sedation during continuous monitoring of the patient's level of consciousness and physiological / cardiorespiratory status by the radiology RN, with a total moderate sedation time of 92 minutes. The venous outflow just central to the fistula was accessed antegrade with 21-gauge micropuncture  needle under real-time ultrasonic guidance after the overlying skin prepped with Betadine, draped in usual sterile fashion, infiltrated locally with 1%lidocaine. Needle exchanged over 018guidewire for transitional dilator through which 4 mg t-PA was administered. Ultrasound images were stored. Through the antegrade dilator, a Bentson wire was advanced . Over this a 62F sheath was placed, through which a 5 [place] Kumpe catheter was advanced for outflow venography. This showed patency of the central venous system through the SVC . A short segment high-grade stenosis in the cephalic arch just peripheral to its confluence with the axillary vein was identified. 3000 units heparin were administered IV. The Kumpe was exchanged over guide wire for the  AngioJet device, used to mechanically thrombolyse the clot in the outflow vein and remove the thrombus from the native outflow. This was exchanged for a 70mm x 4 cm Mustang angioplasty balloon, advanced to the outflow vein. Angioplasty of the outflow vein and tandem venous stenoses was performed. Injection showed partial clearance of thrombus from the outflow vein with antegrade flow. No extravasation or apparent complication of angioplasty. The Angiojet catheter was then advanced again 2 further extract partially occlusive thrombus still in the aneurysmal segments of the outflow vein. Despite multiple passes with intermittent follow-up venography, good antegrade flow through the outflow vein could not be achieved. In fact, there is re- configuration of thrombus such that there is near occlusion of the outflow vein in the aneurysmal segment just proximal to the return to more normal caliber centrally. Once the physiologic limited the Angiojet performance had been reached, the procedure was terminated. The catheter and sheath then removed and hemostasis achieved with 2-0 Ethilon suture . Patient tolerated procedure well. IMPRESSION: 1. Unsuccessful declot of right brachiocephalic  fistula secondary to large amount of occlusive thrombus in the aneurysmal segment of the outflow cephalic vein. 2. Technically successful balloon angioplasty of central outflow venous stenosis. ACCESS: Plan for tunneled hemodialysis catheter placement to follow. Will need surgical fistula revision/ replacement. Electronically Signed   By: Lucrezia Europe M.D.   On: 03/13/2017 13:31   Dg Chest Port 1 View  Result Date: 03/13/2017 CLINICAL DATA:  Chest pain and shortness of breath EXAM: PORTABLE CHEST 1 VIEW COMPARISON:  02/14/2017 FINDINGS: Left-sided central venous catheter tip overlies the proximal right atrium. No pneumothorax. No acute consolidation or effusion. Borderline cardiomegaly. IMPRESSION: 1. Left-sided central venous catheter tip overlies the right atrium 2. Mildly low lung volumes.  Negative for edema or infiltrate. Electronically Signed   By: Donavan Foil M.D.   On: 03/13/2017 15:51   Ir Thrombectomy Av Fistula/w Thrombolysis/pta Inc Shunt/img Right  Result Date: 03/13/2017 CLINICAL DATA:  Occluded right upper arm hemodialysis fistula. EXAM: DIALYSIS FISTULA DECLOT (ATTEMPTED) VENOUS ANGIOPLASTY ULTRASOUND GUIDANCE FOR VASCULAR ACCESS FLUOROSCOPY TIME:  10 minutes 24 seconds, 27 mgy TECHNIQUE: The procedure, risks (including but not limited to bleeding, infection, organ damage ), benefits, and alternatives were explained to the patient with the aid of interpreter. Questions regarding the procedure were encouraged and answered. The patient understands and consents to the procedure. Intravenous Fentanyl and Versed were administered as conscious sedation during continuous monitoring of the patient's level of consciousness and physiological / cardiorespiratory status by the radiology RN, with a total moderate sedation time of 92 minutes. The venous outflow just central to the fistula was accessed antegrade with 21-gauge micropuncture needle under real-time ultrasonic guidance after the overlying  skin prepped with Betadine, draped in usual sterile fashion, infiltrated locally with 1%lidocaine. Needle exchanged over 018guidewire for transitional dilator through which 4 mg t-PA was administered. Ultrasound images were stored. Through the antegrade dilator, a Bentson wire was advanced . Over this a 75F sheath was placed, through which a 5 [place] Kumpe catheter was advanced for outflow venography. This showed patency of the central venous system through the SVC . A short segment high-grade stenosis in the cephalic arch just peripheral to its confluence with the axillary vein was identified. 3000 units heparin were administered IV. The Kumpe was exchanged over guide wire for the AngioJet device, used to mechanically thrombolyse the clot in the outflow vein and remove the thrombus from the native outflow. This was exchanged for a 15mm x  4 cm Mustang angioplasty balloon, advanced to the outflow vein. Angioplasty of the outflow vein and tandem venous stenoses was performed. Injection showed partial clearance of thrombus from the outflow vein with antegrade flow. No extravasation or apparent complication of angioplasty. The Angiojet catheter was then advanced again 2 further extract partially occlusive thrombus still in the aneurysmal segments of the outflow vein. Despite multiple passes with intermittent follow-up venography, good antegrade flow through the outflow vein could not be achieved. In fact, there is re- configuration of thrombus such that there is near occlusion of the outflow vein in the aneurysmal segment just proximal to the return to more normal caliber centrally. Once the physiologic limited the Angiojet performance had been reached, the procedure was terminated. The catheter and sheath then removed and hemostasis achieved with 2-0 Ethilon suture . Patient tolerated procedure well. IMPRESSION: 1. Unsuccessful declot of right brachiocephalic fistula secondary to large amount of occlusive thrombus in the  aneurysmal segment of the outflow cephalic vein. 2. Technically successful balloon angioplasty of central outflow venous stenosis. ACCESS: Plan for tunneled hemodialysis catheter placement to follow. Will need surgical fistula revision/ replacement. Electronically Signed   By: Lucrezia Europe M.D.   On: 03/13/2017 13:31    Assessment and Plan:   1. Elevated troponin: Patient presents with hypertensive urgency, missed HD treatment due to loss of access. Now with troponin of 0.5, BP remains elevated. Likely not ACS. Would recommend HD (planning for tonight). EKG non ischemic. Will order Echo, but will time for tomorrow as it should be done AFTER HD. Will reassess after that is done.   2. HTN: Likely will resolve with HD.   3. ESRD: Planning for HD tonight.   4. Lupus.    Severity of Illness: The appropriate patient status for this patient is OBSERVATION. Observation status is judged to be reasonable and necessary in order to provide the required intensity of service to ensure the patient's safety. The patient's presenting symptoms, physical exam findings, and initial radiographic and laboratory data in the context of their medical condition is felt to place them at decreased risk for further clinical deterioration. Furthermore, it is anticipated that the patient will be medically stable for discharge from the hospital within 2 midnights of admission. The following factors support the patient status of observation.   " The patient's presenting symptoms include nausea . " The physical exam findings include None  " The initial radiographic and laboratory data are worrisome with troponin 0.59     For questions or updates, please contact [place] Please consult [email or web address] for contact info under Cardiology/STEMI.    Signed, Arbutus Leas, NP  03/13/2017 5:04 PM

## 2017-03-13 NOTE — H&P (Signed)
History and Physical    Hser Belanger YHC:[id number] DOB: Nov 04, 1975 DOA: 03/13/2017  PCP: Estanislado Emms, MD   Patient coming from: Home  Chief Complaint: Chest pain, dialysis access problem   HPI: Azra Abrell is a 41 y.o. female with medical history significant for end-stage renal disease on hemodialysis, lupus, hypertension, seizure disorder, and chronic anemia, now presenting to the emergency department with chest pain and dialysis access problem.  Patient reports completing dialysis on 03/10/2017, but was unable to on 03/12/2017 due to a clotted fistula.  She presented today for declot, which was unsuccessful, and she had a dialysis catheter placed.  Catheter was placed, she began to complain of chest pain, and was directed to the ED for further evaluation.  She had been hypertensive, given clonidine, but vomited it back and was later given another dose which she tolerated.  ED Course: Upon arrival to the ED, patient is found to be afebrile, saturating well on 2 L/min of supplemental oxygen, hypertensive to 160/115, and with vitals otherwise stable.  EKG features a sinus rhythm with nonspecific ST and T abnormality.  Chest x-ray is notable for the new left-sided central venous catheter with tip overlying the right atrium, but no edema or infiltrate.  Chemistry panel reveals a potassium of 7.1, bicarbonate 19, BUN 60, and anion gap of 18.  CBC is notable for a slight leukocytosis to 10,900 and a chronic normocytic anemia with hemoglobin of 9.8.  Troponin was elevated to 0.59, improving slightly to 0.45 when repeated 2-1/2 hours later.  Patient was treated with continuous albuterol neb, D50%, 10 units IV NovoLog, and Dilaudid in the ED.  Cardiology has evaluated the patient in the emergency department and given recommendations.  Nephrology was also consulted by the ED physician for urgent dialysis.  Patient remains hypertensive, not in acute respiratory distress, and will be  admitted to the stepdown unit for ongoing evaluation and management of hyperkalemia, chest pain, elevated troponin, and hypertensive urgency, all suspected secondary to end-stage renal disease in need of dialysis.  Review of Systems:  All other systems reviewed and apart from HPI, are negative.  Past Medical History:  Diagnosis Date  . Anemia   . Blood transfusion   . Cervix carcinoma in situ Mar 2011  . CHF (congestive heart failure) ([place])   . Chronic kidney disease    T, Th, Sat  . Dialysis patient [facility])    tues, thursday, saturday.  . Gallstone pancreatitis Feb 2011  . Hypertension   . Lupus   . Lupus nephritis ([place])   . Pneumonia due to organism 03/02/2011  . Proteinuria - cause not known   . Renal failure, acute on chronic (HCC)   . Renal insufficiency   . Seizure disorder ([place]) 03/19/2013  . Thrombocytopenia ([place])     Past Surgical History:  Procedure Laterality Date  . APPENDECTOMY    . AV FISTULA PLACEMENT    . AV FISTULA PLACEMENT  07/18/2011   Procedure: ARTERIOVENOUS (AV) FISTULA CREATION;  Surgeon: Angelia Mould, MD;  Location: [place];  Service: Vascular;  Laterality: Right;  . AV FISTULA PLACEMENT  01/23/12   Revision of Right AVF  . CERVICAL CONE BIOPSY    . CESAREAN SECTION    . CHOLECYSTECTOMY    . EMBOLECTOMY  10/10/2011   Procedure: EMBOLECTOMY;  Surgeon: Angelia Mould, MD;  Location: [place];  Service: Vascular;  Laterality: Right;  . FISTULOGRAM Right 04/12/2012   Procedure: FISTULOGRAM;  Surgeon: Angelia Mould,  MD;  Location: [facility] CATH LAB;  Service: Cardiovascular;  Laterality: Right;  . IR FLUORO GUIDE CV LINE LEFT  03/13/2017  . IR THROMBECTOMY AV FISTULA W/THROMBOLYSIS/PTA INC/SHUNT/IMG RIGHT Right 03/13/2017  . IR US GUIDE VASC ACCESS LEFT  03/13/2017  . IR US GUIDE VASC ACCESS RIGHT  03/13/2017  . SHUNTOGRAM Right 07/07/2011   Procedure: SHUNTOGRAM;  Surgeon: Angelia Mould, MD;  Location: [facility] CATH LAB;  Service:  Cardiovascular;  Laterality: Right;  . SHUNTOGRAM N/A 12/08/2011   Procedure: Earney Mallet;  Surgeon: Angelia Mould, MD;  Location: [facility] CATH LAB;  Service: Cardiovascular;  Laterality: N/A;     reports that she has never smoked. She has never used smokeless tobacco. She reports that she does not drink alcohol or use drugs.  No Known Allergies  Family History  Problem Relation Age of Onset  . Diabetes Mother   . Diabetes Brother   . Diabetes Brother   . Diabetes Maternal Grandmother   . Anesthesia problems Neg Hx      Prior to Admission medications   Medication Sig Start Date End Date Taking? Authorizing Provider  acetaminophen (TYLENOL) 500 MG tablet Take 500 mg by mouth every 6 (six) hours as needed for mild pain or moderate pain.   Yes [provider]  calcium acetate (PHOSLO) 667 MG capsule Take 667 mg by mouth 3 (three) times daily with meals.    Yes [provider]  ferric citrate (AURYXIA) 1 GM 210 MG(Fe) tablet Take 630 mg by mouth 2 (two) times daily.   Yes [provider]  hydroxychloroquine (PLAQUENIL) 200 MG tablet Take 1 tablet (200 mg total) by mouth daily. 01/13/15  Yes Kathie Dike, MD  levETIRAcetam (KEPPRA) 500 MG tablet Take 500 mg by mouth See admin instructions. Pt to take 1 tablet twice daily - and to take an additional tablet after dialysis on Tues Thurs and Sat   Yes [provider]  nortriptyline (PAMELOR) 50 MG capsule Take 50 mg by mouth daily.   Yes [provider]    Physical Exam: Vitals:   03/13/17 1645 03/13/17 1700 03/13/17 1715 03/13/17 1745  BP: (!) 142/105 (!) 146/111 (!) 151/109 (!) 165/117  Pulse: 72 73 74 70  Resp: 12 14 16 12   SpO2: 100% 100% 100% 99%  Weight:      Height:          Constitutional: NAD, calm, in apparent discomfort, lethargic. Receiving continuous albuterol treatment. Eyes: PERTLA, lids and conjunctivae normal ENMT: Mucous membranes are moist. Posterior pharynx clear  of any exudate or lesions.   Neck: normal, supple, no masses, no thyromegaly Respiratory: clear to auscultation bilaterally, no wheezing, no crackles. Normal respiratory effort.    Cardiovascular: S1 & S2 heard, regular rate and rhythm. No significant JVD. Abdomen: No distension, no tenderness, no masses palpated. Bowel sounds normal.  Musculoskeletal: no clubbing / cyanosis. No joint deformity upper and lower extremities.   Skin: no significant rashes, lesions, ulcers. Warm, dry, well-perfused. Neurologic: CN 2-12 grossly intact. Sensation intact. Strength 5/5 in all 4 limbs.  Psychiatric:  Alert and oriented x 3. Calm, cooperative.     Labs on Admission: I have personally reviewed following labs and imaging studies  CBC:  Recent Labs Lab 03/13/17 0854 03/13/17 1540  WBC 7.8 10.9*  NEUTROABS  --  9.1*  HGB 11.0* 9.8*  HCT 34.1* 31.5*  MCV 93.7 95.2  PLT 164 433   Basic Metabolic Panel:  Recent Labs Lab 03/13/17 0854  03/13/17 1754  NA 139 136  K 5.1 7.1*  CL 100* 99*  CO2 24 19*  GLUCOSE 87 119*  BUN 53* 60*  CREATININE 12.42* 12.96*  CALCIUM 8.7* 8.3*   GFR: Estimated Creatinine Clearance: 5.5 mL/min (A) (by C-G formula based on SCr of 12.96 mg/dL (H)). Liver Function Tests: No results for input(s): AST, ALT, ALKPHOS, BILITOT, PROT, ALBUMIN in the last 168 hours. No results for input(s): LIPASE, AMYLASE in the last 168 hours. No results for input(s): AMMONIA in the last 168 hours. Coagulation Profile:  Recent Labs Lab 03/13/17 0854  INR 1.03   Cardiac Enzymes: No results for input(s): CKTOTAL, CKMB, CKMBINDEX, TROPONINI in the last 168 hours. BNP (last 3 results) No results for input(s): PROBNP in the last 8760 hours. HbA1C: No results for input(s): HGBA1C in the last 72 hours. CBG: No results for input(s): GLUCAP in the last 168 hours. Lipid Profile: No results for input(s): CHOL, HDL, LDLCALC, TRIG, CHOLHDL, LDLDIRECT in the last 72 hours. Thyroid  Function Tests: No results for input(s): TSH, T4TOTAL, FREET4, T3FREE, THYROIDAB in the last 72 hours. Anemia Panel: No results for input(s): VITAMINB12, FOLATE, FERRITIN, TIBC, IRON, RETICCTPCT in the last 72 hours. Urine analysis:    Component Value Date/Time   COLORURINE YELLOW 03/02/2011 0948   APPEARANCEUR CLOUDY (A) 03/02/2011 0948   LABSPEC >1.030 (H) 03/02/2011 0948   PHURINE 6.5 03/02/2011 0948   GLUCOSEU NEGATIVE 03/02/2011 0948   HGBUR LARGE (A) 03/02/2011 0948   BILIRUBINUR NEGATIVE 03/02/2011 0948   KETONESUR NEGATIVE 03/02/2011 0948   PROTEINUR >300 (A) 03/02/2011 0948   UROBILINOGEN 0.2 03/02/2011 0948   NITRITE NEGATIVE 03/02/2011 0948   LEUKOCYTESUR NEGATIVE 03/02/2011 0948   Sepsis Labs: @LABRCNTIP (procalcitonin:4,lacticidven:4) )No results found for this or any previous visit (from the past 240 hour(s)).   Radiological Exams on Admission: Ir Fluoro Guide Cv Line Left  Result Date: 03/13/2017 CLINICAL DATA:  End-stage renal disease with occluded hemodialysis fistula which could not be successfully treated with percutaneous techniques. Intermediate term dialysis access is therefore needed. EXAM: TUNNELED HEMODIALYSIS CATHETER PLACEMENT WITH ULTRASOUND AND FLUOROSCOPIC GUIDANCE TECHNIQUE: The procedure, risks, benefits, and alternatives were explained to the patient with the aid of interpreter. Questions regarding the procedure were encouraged and answered. The patient understands and consents to the procedure. Patency of the right IJ vein could not be confirmed on ultrasound. Patency of the left IJ vein was confirmed with ultrasound with image documentation. An appropriate skin site was determined. Region was prepped using maximum barrier technique including cap and mask, sterile gown, sterile gloves, large sterile sheet, and Chlorhexidine as cutaneous antisepsis. The region was infiltrated locally with 1% lidocaine. Intravenous Fentanyl and Versed were administered as  conscious sedation during continuous monitoring of the patient's level of consciousness and physiological / cardiorespiratory status by the radiology RN, with a total moderate sedation time of 92 minutes including previous attempted declot. Under real-time ultrasound guidance, the left IJ vein was accessed with a 21 gauge micropuncture needle; the needle tip within the vein was confirmed with ultrasound image documentation. Needle exchanged over the 018 guidewire for transitional dilator, which allowed advancement of a Benson wire into the IVC. Over this, an MPA catheter was advanced. A Hemosplit 19 hemodialysis catheter was tunneled from the left anterior chest wall approach to the left IJ dermatotomy site. The MPA catheter was exchanged over an Amplatz wire for serial vascular dilators which allow placement of a peel-away sheath, through which the catheter was advanced under  intermittent fluoroscopy, positioned with its tips in the proximal and midright atrium. Spot chest radiograph confirms good catheter position. No pneumothorax. Catheter was flushed and primed per protocol. Catheter secured externally with O Prolene sutures. The left IJ dermatotomy site was closed with Dermabond. COMPLICATIONS: COMPLICATIONS None immediate FLUOROSCOPY TIME:  10 minutes 24 seconds, 27 mGy COMPARISON:  None IMPRESSION: 1. Technically successful placement of tunneled left IJ hemodialysis catheter with ultrasound and fluoroscopic guidance. Ready for routine use. ACCESS: Remains approachable for percutaneous intervention as needed. Electronically Signed   By: Lucrezia Europe M.D.   On: 03/13/2017 13:34   Ir US Guide Vasc Access Left  Result Date: 03/13/2017 CLINICAL DATA:  End-stage renal disease with occluded hemodialysis fistula which could not be successfully treated with percutaneous techniques. Intermediate term dialysis access is therefore needed. EXAM: TUNNELED HEMODIALYSIS CATHETER PLACEMENT WITH ULTRASOUND AND FLUOROSCOPIC  GUIDANCE TECHNIQUE: The procedure, risks, benefits, and alternatives were explained to the patient with the aid of interpreter. Questions regarding the procedure were encouraged and answered. The patient understands and consents to the procedure. Patency of the right IJ vein could not be confirmed on ultrasound. Patency of the left IJ vein was confirmed with ultrasound with image documentation. An appropriate skin site was determined. Region was prepped using maximum barrier technique including cap and mask, sterile gown, sterile gloves, large sterile sheet, and Chlorhexidine as cutaneous antisepsis. The region was infiltrated locally with 1% lidocaine. Intravenous Fentanyl and Versed were administered as conscious sedation during continuous monitoring of the patient's level of consciousness and physiological / cardiorespiratory status by the radiology RN, with a total moderate sedation time of 92 minutes including previous attempted declot. Under real-time ultrasound guidance, the left IJ vein was accessed with a 21 gauge micropuncture needle; the needle tip within the vein was confirmed with ultrasound image documentation. Needle exchanged over the 018 guidewire for transitional dilator, which allowed advancement of a Benson wire into the IVC. Over this, an MPA catheter was advanced. A Hemosplit 19 hemodialysis catheter was tunneled from the left anterior chest wall approach to the left IJ dermatotomy site. The MPA catheter was exchanged over an Amplatz wire for serial vascular dilators which allow placement of a peel-away sheath, through which the catheter was advanced under intermittent fluoroscopy, positioned with its tips in the proximal and midright atrium. Spot chest radiograph confirms good catheter position. No pneumothorax. Catheter was flushed and primed per protocol. Catheter secured externally with O Prolene sutures. The left IJ dermatotomy site was closed with Dermabond. COMPLICATIONS: COMPLICATIONS  None immediate FLUOROSCOPY TIME:  10 minutes 24 seconds, 27 mGy COMPARISON:  None IMPRESSION: 1. Technically successful placement of tunneled left IJ hemodialysis catheter with ultrasound and fluoroscopic guidance. Ready for routine use. ACCESS: Remains approachable for percutaneous intervention as needed. Electronically Signed   By: Lucrezia Europe M.D.   On: 03/13/2017 13:34   Ir US Guide Vasc Access Right  Result Date: 03/13/2017 CLINICAL DATA:  Occluded right upper arm hemodialysis fistula. EXAM: DIALYSIS FISTULA DECLOT (ATTEMPTED) VENOUS ANGIOPLASTY ULTRASOUND GUIDANCE FOR VASCULAR ACCESS FLUOROSCOPY TIME:  10 minutes 24 seconds, 27 mgy TECHNIQUE: The procedure, risks (including but not limited to bleeding, infection, organ damage ), benefits, and alternatives were explained to the patient with the aid of interpreter. Questions regarding the procedure were encouraged and answered. The patient understands and consents to the procedure. Intravenous Fentanyl and Versed were administered as conscious sedation during continuous monitoring of the patient's level of consciousness and physiological / cardiorespiratory status by the  radiology RN, with a total moderate sedation time of 92 minutes. The venous outflow just central to the fistula was accessed antegrade with 21-gauge micropuncture needle under real-time ultrasonic guidance after the overlying skin prepped with Betadine, draped in usual sterile fashion, infiltrated locally with 1%lidocaine. Needle exchanged over 018guidewire for transitional dilator through which 4 mg t-PA was administered. Ultrasound images were stored. Through the antegrade dilator, a Bentson wire was advanced . Over this a 67F sheath was placed, through which a 5 [place] Kumpe catheter was advanced for outflow venography. This showed patency of the central venous system through the SVC . A short segment high-grade stenosis in the cephalic arch just peripheral to its confluence with the  axillary vein was identified. 3000 units heparin were administered IV. The Kumpe was exchanged over guide wire for the AngioJet device, used to mechanically thrombolyse the clot in the outflow vein and remove the thrombus from the native outflow. This was exchanged for a 33mm x 4 cm Mustang angioplasty balloon, advanced to the outflow vein. Angioplasty of the outflow vein and tandem venous stenoses was performed. Injection showed partial clearance of thrombus from the outflow vein with antegrade flow. No extravasation or apparent complication of angioplasty. The Angiojet catheter was then advanced again 2 further extract partially occlusive thrombus still in the aneurysmal segments of the outflow vein. Despite multiple passes with intermittent follow-up venography, good antegrade flow through the outflow vein could not be achieved. In fact, there is re- configuration of thrombus such that there is near occlusion of the outflow vein in the aneurysmal segment just proximal to the return to more normal caliber centrally. Once the physiologic limited the Angiojet performance had been reached, the procedure was terminated. The catheter and sheath then removed and hemostasis achieved with 2-0 Ethilon suture . Patient tolerated procedure well. IMPRESSION: 1. Unsuccessful declot of right brachiocephalic fistula secondary to large amount of occlusive thrombus in the aneurysmal segment of the outflow cephalic vein. 2. Technically successful balloon angioplasty of central outflow venous stenosis. ACCESS: Plan for tunneled hemodialysis catheter placement to follow. Will need surgical fistula revision/ replacement. Electronically Signed   By: Lucrezia Europe M.D.   On: 03/13/2017 13:31   Dg Chest Port 1 View  Result Date: 03/13/2017 CLINICAL DATA:  Chest pain and shortness of breath EXAM: PORTABLE CHEST 1 VIEW COMPARISON:  02/14/2017 FINDINGS: Left-sided central venous catheter tip overlies the proximal right atrium. No  pneumothorax. No acute consolidation or effusion. Borderline cardiomegaly. IMPRESSION: 1. Left-sided central venous catheter tip overlies the right atrium 2. Mildly low lung volumes.  Negative for edema or infiltrate. Electronically Signed   By: Donavan Foil M.D.   On: 03/13/2017 15:51   Ir Thrombectomy Av Fistula/w Thrombolysis/pta Inc Shunt/img Right  Result Date: 03/13/2017 CLINICAL DATA:  Occluded right upper arm hemodialysis fistula. EXAM: DIALYSIS FISTULA DECLOT (ATTEMPTED) VENOUS ANGIOPLASTY ULTRASOUND GUIDANCE FOR VASCULAR ACCESS FLUOROSCOPY TIME:  10 minutes 24 seconds, 27 mgy TECHNIQUE: The procedure, risks (including but not limited to bleeding, infection, organ damage ), benefits, and alternatives were explained to the patient with the aid of interpreter. Questions regarding the procedure were encouraged and answered. The patient understands and consents to the procedure. Intravenous Fentanyl and Versed were administered as conscious sedation during continuous monitoring of the patient's level of consciousness and physiological / cardiorespiratory status by the radiology RN, with a total moderate sedation time of 92 minutes. The venous outflow just central to the fistula was accessed antegrade with 21-gauge micropuncture needle under  real-time ultrasonic guidance after the overlying skin prepped with Betadine, draped in usual sterile fashion, infiltrated locally with 1%lidocaine. Needle exchanged over 018guidewire for transitional dilator through which 4 mg t-PA was administered. Ultrasound images were stored. Through the antegrade dilator, a Bentson wire was advanced . Over this a 67F sheath was placed, through which a 5 [place] Kumpe catheter was advanced for outflow venography. This showed patency of the central venous system through the SVC . A short segment high-grade stenosis in the cephalic arch just peripheral to its confluence with the axillary vein was identified. 3000 units heparin were  administered IV. The Kumpe was exchanged over guide wire for the AngioJet device, used to mechanically thrombolyse the clot in the outflow vein and remove the thrombus from the native outflow. This was exchanged for a 58mm x 4 cm Mustang angioplasty balloon, advanced to the outflow vein. Angioplasty of the outflow vein and tandem venous stenoses was performed. Injection showed partial clearance of thrombus from the outflow vein with antegrade flow. No extravasation or apparent complication of angioplasty. The Angiojet catheter was then advanced again 2 further extract partially occlusive thrombus still in the aneurysmal segments of the outflow vein. Despite multiple passes with intermittent follow-up venography, good antegrade flow through the outflow vein could not be achieved. In fact, there is re- configuration of thrombus such that there is near occlusion of the outflow vein in the aneurysmal segment just proximal to the return to more normal caliber centrally. Once the physiologic limited the Angiojet performance had been reached, the procedure was terminated. The catheter and sheath then removed and hemostasis achieved with 2-0 Ethilon suture . Patient tolerated procedure well. IMPRESSION: 1. Unsuccessful declot of right brachiocephalic fistula secondary to large amount of occlusive thrombus in the aneurysmal segment of the outflow cephalic vein. 2. Technically successful balloon angioplasty of central outflow venous stenosis. ACCESS: Plan for tunneled hemodialysis catheter placement to follow. Will need surgical fistula revision/ replacement. Electronically Signed   By: Lucrezia Europe M.D.   On: 03/13/2017 13:31    EKG: Independently reviewed. Sinus rhythm, non-specific ST- and T-wave abnormality.   Assessment/Plan  1. ESRD, access problem, hyperkalemia, metabolic acidosis, hypertensive urgency  - Pt reports completing HD on 10/23, but had access problems on 10/25, had attempted declot just prior to  admission and catheter was placed in left chest after procedure was unsuccessful  - Hypertensive urgency noted, treated with clonidine and prn hydralazine IVP's  - There is volume-overload, but no pulm edema or resp distress  - Mild acidosis, treated with bicarb in setting of hyperkalemia  - Potassium has risen to 7.1 and she was treated in ED with albuterol neb, dextrose/insulin - Nephrology has been consulted and urgent HD arranged  - Continue cardiac monitoring, SLIV, renal diet, repeat chemistries after HD    2. Chest pain, elevated troponin  - Pt developed chest pain today and had troponin level of 0.59  - Appreciate cardiology evaluation, anticipate improvement with HD  - Second troponin has decreased to 0.45  - EKG with non-specific ST and T abnormalities  - Continue cardiac monitoring, trend troponin, follow-up echo after HD    3. Seizure disorder  - Continue Keppra    4. SLE - Stable  - Continue Plaquenil   5. Anemia - Hgb is 9.8 on admission, down from recent priors in 11-11.5 range  - Recent drop likely secondary to declot procedure and hemodilution with missed HD  - Monitor with repeat CBC in am  DVT prophylaxis: sq heparin  Code Status: Full  Family Communication: Discussed with patient Disposition Plan: Admit to SDU Consults called: Cardiology, Nephrology Admission status: Inpatient    Vianne Bulls, MD [facility] Pager [phone]  If 7PM-7AM, please contact night-coverage [email or web address] Password [email or web address]  03/13/2017, 7:19 PM

## 2017-03-13 NOTE — ED Provider Notes (Signed)
[facility] EMERGENCY DEPARTMENT Provider Note   CSN: [id number] Arrival date & time: 03/13/17  1439     History   Chief Complaint Chief Complaint  Patient presents with  . Chest Pain   The patient was interviewed using a translator at the bedside.  HPI Maria Vazquez is a 41 y.o. female.  Here for evaluation of high blood pressure and chest pain which started after an outpatient procedure, today.  She was being seen for a clotted right upper arm fistula, and subsequently had a left chest permacath installed.  During and after the procedure it was noted that she had severe chest pain which radiated to her upper back.  Blood pressure was also elevated, so she was sent here for evaluation.  Patient continues to complain of chest and upper back pain.  She has nausea without vomiting.  She denies symptoms preceding the procedure today.  Her last dialysis was 3 days ago.  She denies cough, shortness of breath, weakness or paresthesia.  There are no other known modifying factors.  HPI  Past Medical History:  Diagnosis Date  . Anemia   . Blood transfusion   . Cervix carcinoma in situ Mar 2011  . CHF (congestive heart failure) ([place])   . Chronic kidney disease    T, Th, Sat  . Dialysis patient [facility])    tues, thursday, saturday.  . Gallstone pancreatitis Feb 2011  . Hypertension   . Lupus   . Lupus nephritis ([place])   . Pneumonia due to organism 03/02/2011  . Proteinuria - cause not known   . Renal failure, acute on chronic (HCC)   . Renal insufficiency   . Seizure disorder ([place]) 03/19/2013  . Thrombocytopenia [facility])     Patient Active Problem List   Diagnosis Date Noted  . Vertigo 01/10/2015  . HTN (hypertension) 01/10/2015  . Hyperkalemia 01/10/2015  . Dizziness 01/10/2015  . Pancreatitis, acute 01/06/2014  . Chest pain 01/05/2014  . Left sided chest pain 01/05/2014  . Nausea & vomiting 01/05/2014  . Abdominal pain, lower 01/05/2014  . Yeast  vaginitis 01/05/2014  . Seizure disorder ([place]) 03/19/2013  . Meningitis 03/19/2013  . ESRD (end stage renal disease) on dialysis ([place]) 03/19/2013  . Pericardial effusion 03/19/2013  . End stage renal disease ([place]) 10/08/2011  . Lupus (systemic lupus erythematosus) ([place]) 03/02/2011  . ARF (acute renal failure) ([place]) 03/02/2011  . Chest pain at rest 03/02/2011  . Hypertensive urgency 03/02/2011  . Pneumonia due to organism 03/02/2011  . Proteinuria - cause not known   . SOB (shortness of breath) 01/09/2011  . Bilateral swelling of feet 01/09/2011  . Facial swelling 01/09/2011  . Proteinuria 01/09/2011  . Anemia in ESRD (end-stage renal disease) ([place]) 01/09/2011    Past Surgical History:  Procedure Laterality Date  . APPENDECTOMY    . AV FISTULA PLACEMENT    . AV FISTULA PLACEMENT  07/18/2011   Procedure: ARTERIOVENOUS (AV) FISTULA CREATION;  Surgeon: Angelia Mould, MD;  Location: [facility];  Service: Vascular;  Laterality: Right;  . AV FISTULA PLACEMENT  01/23/12   Revision of Right AVF  . CERVICAL CONE BIOPSY    . CESAREAN SECTION    . CHOLECYSTECTOMY    . EMBOLECTOMY  10/10/2011   Procedure: EMBOLECTOMY;  Surgeon: Angelia Mould, MD;  Location: [facility];  Service: Vascular;  Laterality: Right;  . FISTULOGRAM Right 04/12/2012   Procedure: FISTULOGRAM;  Surgeon: Angelia Mould, MD;  Location: [facility] CATH LAB;  Service: Cardiovascular;  Laterality: Right;  . IR FLUORO GUIDE CV LINE LEFT  03/13/2017  . IR THROMBECTOMY AV FISTULA W/THROMBOLYSIS/PTA INC/SHUNT/IMG RIGHT Right 03/13/2017  . IR US GUIDE VASC ACCESS LEFT  03/13/2017  . IR US GUIDE VASC ACCESS RIGHT  03/13/2017  . SHUNTOGRAM Right 07/07/2011   Procedure: SHUNTOGRAM;  Surgeon: Angelia Mould, MD;  Location[facility] LAB;  Service: Cardiovascular;  Laterality: Right;  . SHUNTOGRAM N/A 12/08/2011   Procedure: Earney Mallet;  Surgeon: Angelia Mould, MD;  Location: [facility] CATH LAB;  Service: Cardiovascular;   Laterality: N/A;    OB History    Gravida Para Term Preterm AB Living   4 3     1 3    SAB TAB Ectopic Multiple Live Births   1               Home Medications    Prior to Admission medications   Medication Sig Start Date End Date Taking? Authorizing Provider  acetaminophen (TYLENOL) 500 MG tablet Take 500 mg by mouth every 6 (six) hours as needed for mild pain or moderate pain.   Yes [provider]  calcium acetate (PHOSLO) 667 MG capsule Take 667 mg by mouth 3 (three) times daily with meals.    Yes [provider]  ferric citrate (AURYXIA) 1 GM 210 MG(Fe) tablet Take 630 mg by mouth 2 (two) times daily.   Yes [provider]  hydroxychloroquine (PLAQUENIL) 200 MG tablet Take 1 tablet (200 mg total) by mouth daily. 01/13/15  Yes Kathie Dike, MD  levETIRAcetam (KEPPRA) 500 MG tablet Take 500 mg by mouth See admin instructions. Pt to take 1 tablet twice daily - and to take an additional tablet after dialysis on Tues Thurs and Sat   Yes [provider]  nortriptyline (PAMELOR) 50 MG capsule Take 50 mg by mouth daily.   Yes [provider]    Family History Family History  Problem Relation Age of Onset  . Diabetes Mother   . Diabetes Brother   . Diabetes Brother   . Diabetes Maternal Grandmother   . Anesthesia problems Neg Hx     Social History Social History  Substance Use Topics  . Smoking status: Never Smoker  . Smokeless tobacco: Never Used  . Alcohol use No     Allergies   Patient has no known allergies.   Review of Systems Review of Systems  All other systems reviewed and are negative.    Physical Exam Updated Vital Signs BP (!) 167/122   Pulse 73   Resp 19   Ht 5\' 6"  (1.676 m)   Wt 68 kg (150 lb)   LMP 02/11/2017   SpO2 100%   BMI 24.21 kg/m   Physical Exam  Constitutional: She is oriented to person, place, and time. She appears well-developed. She appears distressed (She is uncomfortable).  She  appears older than stated age.  HENT:  Head: Normocephalic and atraumatic.  Eyes: Pupils are equal, round, and reactive to light. Conjunctivae and EOM are normal.  Neck: Normal range of motion and phonation normal. Neck supple.  Cardiovascular: Normal rate and regular rhythm.   Hypertensive  Pulmonary/Chest: Effort normal and breath sounds normal. She exhibits no tenderness.  Left chest external catheter, site and appliance appear normal.  Abdominal: Soft. She exhibits no distension. There is no tenderness. There is no guarding.  Musculoskeletal: Normal range of motion.  Dressing on right upper arm.  Neurological: She is alert and oriented to  person, place, and time. She exhibits normal muscle tone.  Skin: Skin is warm and dry.  Psychiatric: She has a normal mood and affect. Her behavior is normal. Judgment and thought content normal.  Nursing note and vitals reviewed.    ED Treatments / Results  Labs (all labs ordered are listed, but only abnormal results are displayed) Labs Reviewed  CBC WITH DIFFERENTIAL/PLATELET - Abnormal; Notable for the following:       Result Value   WBC 10.9 (*)    RBC 3.31 (*)    Hemoglobin 9.8 (*)    HCT 31.5 (*)    RDW 16.4 (*)    Neutro Abs 9.1 (*)    All other components within normal limits  I-STAT TROPONIN, ED - Abnormal; Notable for the following:    Troponin i, poc 0.59 (*)    All other components within normal limits  I-STAT TROPONIN, ED    EKG  EKG Interpretation  Date/Time:  Friday March 13 2017 16:34:09 EDT Ventricular Rate:  71 PR Interval:    QRS Duration: 89 QT Interval:  445 QTC Calculation: 484 R Axis:   43 Text Interpretation:  Sinus rhythm Abnormal T, consider ischemia, lateral leads Since last tracing of earlier today No significant change was found Confirmed by Daleen Bo [phone]) on 03/13/2017 5:22:10 PM       Radiology Ir Cyndy Freeze Guide Cv Line Left  Result Date: 03/13/2017 CLINICAL DATA:  End-stage renal  disease with occluded hemodialysis fistula which could not be successfully treated with percutaneous techniques. Intermediate term dialysis access is therefore needed. EXAM: TUNNELED HEMODIALYSIS CATHETER PLACEMENT WITH ULTRASOUND AND FLUOROSCOPIC GUIDANCE TECHNIQUE: The procedure, risks, benefits, and alternatives were explained to the patient with the aid of interpreter. Questions regarding the procedure were encouraged and answered. The patient understands and consents to the procedure. Patency of the right IJ vein could not be confirmed on ultrasound. Patency of the left IJ vein was confirmed with ultrasound with image documentation. An appropriate skin site was determined. Region was prepped using maximum barrier technique including cap and mask, sterile gown, sterile gloves, large sterile sheet, and Chlorhexidine as cutaneous antisepsis. The region was infiltrated locally with 1% lidocaine. Intravenous Fentanyl and Versed were administered as conscious sedation during continuous monitoring of the patient's level of consciousness and physiological / cardiorespiratory status by the radiology RN, with a total moderate sedation time of 92 minutes including previous attempted declot. Under real-time ultrasound guidance, the left IJ vein was accessed with a 21 gauge micropuncture needle; the needle tip within the vein was confirmed with ultrasound image documentation. Needle exchanged over the 018 guidewire for transitional dilator, which allowed advancement of a Benson wire into the IVC. Over this, an MPA catheter was advanced. A Hemosplit 19 hemodialysis catheter was tunneled from the left anterior chest wall approach to the left IJ dermatotomy site. The MPA catheter was exchanged over an Amplatz wire for serial vascular dilators which allow placement of a peel-away sheath, through which the catheter was advanced under intermittent fluoroscopy, positioned with its tips in the proximal and midright atrium. Spot  chest radiograph confirms good catheter position. No pneumothorax. Catheter was flushed and primed per protocol. Catheter secured externally with O Prolene sutures. The left IJ dermatotomy site was closed with Dermabond. COMPLICATIONS: COMPLICATIONS None immediate FLUOROSCOPY TIME:  10 minutes 24 seconds, 27 mGy COMPARISON:  None IMPRESSION: 1. Technically successful placement of tunneled left IJ hemodialysis catheter with ultrasound and fluoroscopic guidance. Ready for routine use. ACCESS: Remains  approachable for percutaneous intervention as needed. Electronically Signed   By: Lucrezia Europe M.D.   On: 03/13/2017 13:34   Ir US Guide Vasc Access Left  Result Date: 03/13/2017 CLINICAL DATA:  End-stage renal disease with occluded hemodialysis fistula which could not be successfully treated with percutaneous techniques. Intermediate term dialysis access is therefore needed. EXAM: TUNNELED HEMODIALYSIS CATHETER PLACEMENT WITH ULTRASOUND AND FLUOROSCOPIC GUIDANCE TECHNIQUE: The procedure, risks, benefits, and alternatives were explained to the patient with the aid of interpreter. Questions regarding the procedure were encouraged and answered. The patient understands and consents to the procedure. Patency of the right IJ vein could not be confirmed on ultrasound. Patency of the left IJ vein was confirmed with ultrasound with image documentation. An appropriate skin site was determined. Region was prepped using maximum barrier technique including cap and mask, sterile gown, sterile gloves, large sterile sheet, and Chlorhexidine as cutaneous antisepsis. The region was infiltrated locally with 1% lidocaine. Intravenous Fentanyl and Versed were administered as conscious sedation during continuous monitoring of the patient's level of consciousness and physiological / cardiorespiratory status by the radiology RN, with a total moderate sedation time of 92 minutes including previous attempted declot. Under real-time ultrasound  guidance, the left IJ vein was accessed with a 21 gauge micropuncture needle; the needle tip within the vein was confirmed with ultrasound image documentation. Needle exchanged over the 018 guidewire for transitional dilator, which allowed advancement of a Benson wire into the IVC. Over this, an MPA catheter was advanced. A Hemosplit 19 hemodialysis catheter was tunneled from the left anterior chest wall approach to the left IJ dermatotomy site. The MPA catheter was exchanged over an Amplatz wire for serial vascular dilators which allow placement of a peel-away sheath, through which the catheter was advanced under intermittent fluoroscopy, positioned with its tips in the proximal and midright atrium. Spot chest radiograph confirms good catheter position. No pneumothorax. Catheter was flushed and primed per protocol. Catheter secured externally with O Prolene sutures. The left IJ dermatotomy site was closed with Dermabond. COMPLICATIONS: COMPLICATIONS None immediate FLUOROSCOPY TIME:  10 minutes 24 seconds, 27 mGy COMPARISON:  None IMPRESSION: 1. Technically successful placement of tunneled left IJ hemodialysis catheter with ultrasound and fluoroscopic guidance. Ready for routine use. ACCESS: Remains approachable for percutaneous intervention as needed. Electronically Signed   By: Lucrezia Europe M.D.   On: 03/13/2017 13:34   Ir US Guide Vasc Access Right  Result Date: 03/13/2017 CLINICAL DATA:  Occluded right upper arm hemodialysis fistula. EXAM: DIALYSIS FISTULA DECLOT (ATTEMPTED) VENOUS ANGIOPLASTY ULTRASOUND GUIDANCE FOR VASCULAR ACCESS FLUOROSCOPY TIME:  10 minutes 24 seconds, 27 mgy TECHNIQUE: The procedure, risks (including but not limited to bleeding, infection, organ damage ), benefits, and alternatives were explained to the patient with the aid of interpreter. Questions regarding the procedure were encouraged and answered. The patient understands and consents to the procedure. Intravenous Fentanyl and  Versed were administered as conscious sedation during continuous monitoring of the patient's level of consciousness and physiological / cardiorespiratory status by the radiology RN, with a total moderate sedation time of 92 minutes. The venous outflow just central to the fistula was accessed antegrade with 21-gauge micropuncture needle under real-time ultrasonic guidance after the overlying skin prepped with Betadine, draped in usual sterile fashion, infiltrated locally with 1%lidocaine. Needle exchanged over 018guidewire for transitional dilator through which 4 mg t-PA was administered. Ultrasound images were stored. Through the antegrade dilator, a Bentson wire was advanced . Over this a 57F sheath was placed, through  which a 5 [place] Kumpe catheter was advanced for outflow venography. This showed patency of the central venous system through the SVC . A short segment high-grade stenosis in the cephalic arch just peripheral to its confluence with the axillary vein was identified. 3000 units heparin were administered IV. The Kumpe was exchanged over guide wire for the AngioJet device, used to mechanically thrombolyse the clot in the outflow vein and remove the thrombus from the native outflow. This was exchanged for a 105mm x 4 cm Mustang angioplasty balloon, advanced to the outflow vein. Angioplasty of the outflow vein and tandem venous stenoses was performed. Injection showed partial clearance of thrombus from the outflow vein with antegrade flow. No extravasation or apparent complication of angioplasty. The Angiojet catheter was then advanced again 2 further extract partially occlusive thrombus still in the aneurysmal segments of the outflow vein. Despite multiple passes with intermittent follow-up venography, good antegrade flow through the outflow vein could not be achieved. In fact, there is re- configuration of thrombus such that there is near occlusion of the outflow vein in the aneurysmal segment just proximal  to the return to more normal caliber centrally. Once the physiologic limited the Angiojet performance had been reached, the procedure was terminated. The catheter and sheath then removed and hemostasis achieved with 2-0 Ethilon suture . Patient tolerated procedure well. IMPRESSION: 1. Unsuccessful declot of right brachiocephalic fistula secondary to large amount of occlusive thrombus in the aneurysmal segment of the outflow cephalic vein. 2. Technically successful balloon angioplasty of central outflow venous stenosis. ACCESS: Plan for tunneled hemodialysis catheter placement to follow. Will need surgical fistula revision/ replacement. Electronically Signed   By: Lucrezia Europe M.D.   On: 03/13/2017 13:31   Dg Chest Port 1 View  Result Date: 03/13/2017 CLINICAL DATA:  Chest pain and shortness of breath EXAM: PORTABLE CHEST 1 VIEW COMPARISON:  02/14/2017 FINDINGS: Left-sided central venous catheter tip overlies the proximal right atrium. No pneumothorax. No acute consolidation or effusion. Borderline cardiomegaly. IMPRESSION: 1. Left-sided central venous catheter tip overlies the right atrium 2. Mildly low lung volumes.  Negative for edema or infiltrate. Electronically Signed   By: Donavan Foil M.D.   On: 03/13/2017 15:51   Ir Thrombectomy Av Fistula/w Thrombolysis/pta Inc Shunt/img Right  Result Date: 03/13/2017 CLINICAL DATA:  Occluded right upper arm hemodialysis fistula. EXAM: DIALYSIS FISTULA DECLOT (ATTEMPTED) VENOUS ANGIOPLASTY ULTRASOUND GUIDANCE FOR VASCULAR ACCESS FLUOROSCOPY TIME:  10 minutes 24 seconds, 27 mgy TECHNIQUE: The procedure, risks (including but not limited to bleeding, infection, organ damage ), benefits, and alternatives were explained to the patient with the aid of interpreter. Questions regarding the procedure were encouraged and answered. The patient understands and consents to the procedure. Intravenous Fentanyl and Versed were administered as conscious sedation during continuous  monitoring of the patient's level of consciousness and physiological / cardiorespiratory status by the radiology RN, with a total moderate sedation time of 92 minutes. The venous outflow just central to the fistula was accessed antegrade with 21-gauge micropuncture needle under real-time ultrasonic guidance after the overlying skin prepped with Betadine, draped in usual sterile fashion, infiltrated locally with 1%lidocaine. Needle exchanged over 018guidewire for transitional dilator through which 4 mg t-PA was administered. Ultrasound images were stored. Through the antegrade dilator, a Bentson wire was advanced . Over this a 78F sheath was placed, through which a 5 [place] Kumpe catheter was advanced for outflow venography. This showed patency of the central venous system through the SVC . A short segment high-grade  stenosis in the cephalic arch just peripheral to its confluence with the axillary vein was identified. 3000 units heparin were administered IV. The Kumpe was exchanged over guide wire for the AngioJet device, used to mechanically thrombolyse the clot in the outflow vein and remove the thrombus from the native outflow. This was exchanged for a 22mm x 4 cm Mustang angioplasty balloon, advanced to the outflow vein. Angioplasty of the outflow vein and tandem venous stenoses was performed. Injection showed partial clearance of thrombus from the outflow vein with antegrade flow. No extravasation or apparent complication of angioplasty. The Angiojet catheter was then advanced again 2 further extract partially occlusive thrombus still in the aneurysmal segments of the outflow vein. Despite multiple passes with intermittent follow-up venography, good antegrade flow through the outflow vein could not be achieved. In fact, there is re- configuration of thrombus such that there is near occlusion of the outflow vein in the aneurysmal segment just proximal to the return to more normal caliber centrally. Once the  physiologic limited the Angiojet performance had been reached, the procedure was terminated. The catheter and sheath then removed and hemostasis achieved with 2-0 Ethilon suture . Patient tolerated procedure well. IMPRESSION: 1. Unsuccessful declot of right brachiocephalic fistula secondary to large amount of occlusive thrombus in the aneurysmal segment of the outflow cephalic vein. 2. Technically successful balloon angioplasty of central outflow venous stenosis. ACCESS: Plan for tunneled hemodialysis catheter placement to follow. Will need surgical fistula revision/ replacement. Electronically Signed   By: Lucrezia Europe M.D.   On: 03/13/2017 13:31    Procedures Procedures (including critical care time)  Medications Ordered in ED Medications  0.9 %  sodium chloride infusion (not administered)  HYDROmorphone (DILAUDID) injection 1 mg (1 mg Intravenous Given 03/13/17 1606)  ondansetron (ZOFRAN) injection 4 mg (4 mg Intravenous Given 03/13/17 1605)     Initial Impression / Assessment and Plan / ED Course  I have reviewed the triage vital signs and the nursing notes.  Pertinent labs & imaging results that were available during my care of the patient were reviewed by me and considered in my medical decision making (see chart for details).  Clinical Course as of Mar 13 1756  Fri Mar 13, 2017  1601 high Troponin i, poc: (!!) 0.59 [EW]  1601 Patient is still having pain, and is currently vomiting.  We will repeat the EKG, and reassess.  [EW]    Clinical Course User Index [EW] Daleen Bo, MD     Patient Vitals for the past 24 hrs:  BP Pulse Resp SpO2 Height Weight  03/13/17 1515 (!) 167/122 73 19 100 % - -  03/13/17 1500 (!) 166/126 66 14 100 % - -  03/13/17 1456 (!) 162/117 70 15 100 % - -  03/13/17 1448 - - - - 5\' 6"  (1.676 m) 68 kg (150 lb)   16:08-case discussed with cardiology, requested consultation for them to see in the ED, regarding elevated troponin level.  5:28 PM  Reevaluation with update and discussion. After initial assessment and treatment, an updated evaluation reveals patient is resting comfortably at this time.  Blood pressure is improved.  Findings discussed with patient's husband, who is in the room, using the translator.Daleen Bo L     Final Clinical Impressions(s) / ED Diagnoses   Final diagnoses:  Nonspecific chest pain  Elevated troponin  End stage renal disease (HCC)  Hypertension, unspecified type   Nonspecific chest pain, high blood pressure, and troponin elevation.  No evidence  for abnormal EKG.  Nursing Notes Reviewed/ Care Coordinated Applicable Imaging Reviewed Interpretation of Laboratory Data incorporated into ED treatment  Plan: Admit  New Prescriptions New Prescriptions   No medications on file     Daleen Bo, MD 03/14/17 [phone]

## 2017-03-13 NOTE — Procedures (Signed)
  Procedure: 1. Declot fistula and venous angioplasty (attempted) 2. L IJ tunneled HD catheter placement  19cm  Preprocedure diagnosis: ESRD Postprocedure diagnosis: same EBL:   minimal Complications:  none immediate  See full dictation in [facility].  Dillard Cannon MD Main # [phone] Pager  [phone]

## 2017-03-13 NOTE — ED Triage Notes (Signed)
Patient presents to the ED from short stay with complaints  Chest Pain. Per Short stay patient had a clot in right Fistula and was tyring to get removed. Per RN patient received 400mg  Fentanyl 7 versed 300 Heparin and TPA was used.   Patient BP after the procedure patient BP 173/123. Patient given 15mg  labetalol. RN reports patient fistula then started to bleed and pressure bandage placed. Patient complains of right sided chest pain medial to Dialysis port. Patient reports increased SOB.

## 2017-03-13 NOTE — ED Notes (Signed)
Pt to dialysis at this time

## 2017-03-13 NOTE — Progress Notes (Signed)
Patient ID: Maria Vazquez, female   DOB: [birth year], 41 y.o.   MRN: [id number]   Pt did receive po Clonidine---but Within few minutes became nauseated and vomited copiously. (pill was in vomitus).  Left arm fistula bled from an old dialysis access site Area of upper aneurysmal portion Pressure was held and hemostasis accomplished  New pressure dressing placed  I called Dr Jimmy Footman and Sanjuana Kava PA Agreed pt should be seen in ED asap Probable admission  Pt and family agreeable [place] RN transferring pt to ED   Dr Vernard Gambles also has seen and evaluated pt Agreeable for ED visit/evaluation

## 2017-03-13 NOTE — Sedation Documentation (Signed)
Pt states pain is an 8 out of 10.  More sedation medicine given.

## 2017-03-13 NOTE — H&P (Signed)
Chief Complaint: Patient was seen in consultation today for right arm dialysis fistula declot at the request of [facility]  Referring Physician(s): Zeyfang,David   Supervising Physician: Arne Cleveland  Patient Status: [facility] - Out-pt  History of Present Illness: Maria Vazquez is a 41 y.o. female   Right arm dialysis fistula placed 07/2011 - Dr Scot Dock One revision 01/2012 Minimal trouble with fistula over 5 years. Last use Tues Oct 23---full dialysis Thursday  10/25 clotted per Dialysis center  Scheduled now for thrombolysis with possible angioplasty/stent placement Possible dialysis catheter placement Denies recent surgery; CVA; blood thinners   Past Medical History:  Diagnosis Date  . Anemia   . Blood transfusion   . Cervix carcinoma in situ Mar 2011  . CHF (congestive heart failure) ([place])   . Chronic kidney disease    T, Th, Sat  . Dialysis patient [facility])    tues, thursday, saturday.  . Gallstone pancreatitis Feb 2011  . Hypertension   . Lupus   . Lupus nephritis ([place])   . Pneumonia due to organism 03/02/2011  . Proteinuria - cause not known   . Renal failure, acute on chronic (HCC)   . Renal insufficiency   . Seizure disorder ([place]) 03/19/2013  . Thrombocytopenia ([place])     Past Surgical History:  Procedure Laterality Date  . APPENDECTOMY    . AV FISTULA PLACEMENT    . AV FISTULA PLACEMENT  07/18/2011   Procedure: ARTERIOVENOUS (AV) FISTULA CREATION;  Surgeon: Angelia Mould, MD;  Location: [place];  Service: Vascular;  Laterality: Right;  . AV FISTULA PLACEMENT  01/23/12   Revision of Right AVF  . CERVICAL CONE BIOPSY    . CESAREAN SECTION    . CHOLECYSTECTOMY    . EMBOLECTOMY  10/10/2011   Procedure: EMBOLECTOMY;  Surgeon: Angelia Mould, MD;  Location: [place];  Service: Vascular;  Laterality: Right;  . FISTULOGRAM Right 04/12/2012   Procedure: FISTULOGRAM;  Surgeon: Angelia Mould, MD;  Location: [facility] CATH LAB;  Service:  Cardiovascular;  Laterality: Right;  . SHUNTOGRAM Right 07/07/2011   Procedure: SHUNTOGRAM;  Surgeon: Angelia Mould, MD;  Location: [facility] CATH LAB;  Service: Cardiovascular;  Laterality: Right;  . SHUNTOGRAM N/A 12/08/2011   Procedure: Earney Mallet;  Surgeon: Angelia Mould, MD;  Location: [facility] CATH LAB;  Service: Cardiovascular;  Laterality: N/A;    Allergies: Patient has no known allergies.  Medications: Prior to Admission medications   Medication Sig Start Date End Date Taking? Authorizing Provider  acetaminophen (TYLENOL) 500 MG tablet Take 500 mg by mouth every 6 (six) hours as needed for mild pain or moderate pain.    [provider]  calcium acetate (PHOSLO) 667 MG capsule Take 667 mg by mouth 3 (three) times daily with meals.     [provider]  ferric citrate (AURYXIA) 1 GM 210 MG(Fe) tablet Take 630 mg by mouth 2 (two) times daily.    [provider]  hydroxychloroquine (PLAQUENIL) 200 MG tablet Take 1 tablet (200 mg total) by mouth daily. 01/13/15   Kathie Dike, MD  levETIRAcetam (KEPPRA) 500 MG tablet Take 500 mg by mouth See admin instructions. Pt to take 1 tablet twice daily - and to take an additional tablet after dialysis on Tues Thurs and Sat    [provider]  nortriptyline (PAMELOR) 50 MG capsule Take 50 mg by mouth daily.    [provider]     Family History  Problem Relation Age of Onset  .  Diabetes Mother   . Diabetes Brother   . Diabetes Brother   . Diabetes Maternal Grandmother   . Anesthesia problems Neg Hx     Social History   Social History  . Marital status: Married    Spouse name: N/A  . Number of children: N/A  . Years of education: N/A   Social History Main Topics  . Smoking status: Never Smoker  . Smokeless tobacco: Never Used  . Alcohol use No  . Drug use: No  . Sexual activity: Yes    Birth control/ protection: Condom, None   Other Topics Concern  . None   Social History  Narrative  . None    Review of Systems: A 12 point ROS discussed and pertinent positives are indicated in the HPI above.  All other systems are negative.  Review of Systems  Constitutional: Positive for activity change. Negative for fatigue, fever and unexpected weight change.  Respiratory: Negative for shortness of breath.   Cardiovascular: Negative for chest pain.  Gastrointestinal: Negative for abdominal pain.  Neurological: Negative for weakness.  Psychiatric/Behavioral: Negative for behavioral problems and confusion.    Vital Signs: LMP 02/12/2017   Physical Exam  Constitutional: She is oriented to person, place, and time.  Cardiovascular: Normal rate, regular rhythm and normal heart sounds.   Pulmonary/Chest: Effort normal and breath sounds normal. She has no wheezes.  Abdominal: Soft. Bowel sounds are normal. There is no tenderness.  Musculoskeletal: Normal range of motion.  + pulses in right upper arm dialysis fistula  Neurological: She is alert and oriented to person, place, and time.  Skin: Skin is warm and dry.  Psychiatric: She has a normal mood and affect. Her behavior is normal. Judgment and thought content normal.  Speaks some English Using Interpreter at bedside  Nursing note and vitals reviewed.   Imaging: Ct Abdomen Wo Contrast  Result Date: 02/14/2017 CLINICAL DATA:  Abdominal pain. EXAM: CT ABDOMEN WITHOUT CONTRAST TECHNIQUE: Multidetector CT imaging of the abdomen was performed following the standard protocol without IV contrast. COMPARISON:  CT scan of August 19, 2014. FINDINGS: Lower chest: No acute abnormality. Hepatobiliary: No focal liver abnormality is seen. Status post cholecystectomy. No biliary dilatation. Pancreas: Unremarkable. No pancreatic ductal dilatation or surrounding inflammatory changes. Spleen: Calcified splenic granulomata are noted. Adrenals/Urinary Tract: Adrenal glands are unremarkable. Severe bilateral renal atrophy is noted consistent  with end-stage renal disease. Stomach/Bowel: The stomach and visualized bowel are unremarkable. No evidence of bowel dilatation or inflammation is noted. Vascular/Lymphatic: No significant vascular abnormality is noted. No adenopathy is noted. Other: No abdominal wall hernia or abnormality. Musculoskeletal: No acute or significant osseous findings. IMPRESSION: Calcified splenic granulomata. Severe bilateral renal atrophy consistent with end-stage renal disease. No acute abnormality seen within the abdomen. Electronically Signed   By: Marijo Conception, M.D.   On: 02/14/2017 16:28   Dg Chest 2 View  Result Date: 02/14/2017 CLINICAL DATA:  41 year old female with a history of chest pain and dizziness EXAM: CHEST  2 VIEW COMPARISON:  01/10/2015 FINDINGS: Cardiomediastinal silhouette within normal limits. No evidence of central vascular congestion. No pneumothorax or pleural effusion. No confluent airspace disease. No displaced fracture. IMPRESSION: No radiographic evidence of acute cardiopulmonary disease Electronically Signed   By: Corrie Mckusick D.O.   On: 02/14/2017 12:47    Labs:  CBC:  Recent Labs  02/14/17 1228 03/13/17 0854  WBC 5.2 7.8  HGB 11.5* 11.0*  HCT 35.6* 34.1*  PLT 181 164    COAGS:  Recent Labs  03/13/17 0854  INR 1.03  APTT 35    BMP:  Recent Labs  02/14/17 1228 03/13/17 0854  NA 142 139  K 4.1 5.1  CL 96* 100*  CO2 34* 24  GLUCOSE 90 87  BUN 13 53*  CALCIUM 9.5 8.7*  CREATININE 3.75* 12.42*  GFRNONAA 14* 3*  GFRAA 16* 4*    LIVER FUNCTION TESTS:  Recent Labs  02/14/17 1228  BILITOT 0.7  AST 17  ALT 9*  ALKPHOS 79  PROT 7.9  ALBUMIN 4.0    TUMOR MARKERS: No results for input(s): AFPTM, CEA, CA199, CHROMGRNA in the last 8760 hours.  Assessment and Plan:  Rt arm dialysis fistula placed 07/2011 Has had one revision 01/2012 Minimal troubles now in 5 yrs Noted clotted Thurs 10/25- per dialysis center Scheduled now for thrombolysis with  possible angioplasty/stent placement; Possible tunneled dialysis catheter placement Risks and benefits discussed with the patient including, but not limited to bleeding, infection, vascular injury, pulmonary embolism, need for tunneled HD catheter placement or even death. All of the patient's questions were answered, patient is agreeable to proceed. Consent signed and in chart.   Thank you for this interesting consult.  I greatly enjoyed meeting Kersti Scavone and look forward to participating in their care.  A copy of this report was sent to the requesting provider on this date.  Electronically Signed: Lavonia Drafts, PA-C 03/13/2017, 9:45 AM   I spent a total of  30 Minutes   in face to face in clinical consultation, greater than 50% of which was counseling/coordinating care for right arm fistula thromolysis

## 2017-03-14 ENCOUNTER — Inpatient Hospital Stay (HOSPITAL_COMMUNITY): Payer: Medicaid Other

## 2017-03-14 ENCOUNTER — Encounter (HOSPITAL_COMMUNITY): Payer: Self-pay

## 2017-03-14 LAB — MRSA PCR SCREENING: MRSA by PCR: NEGATIVE

## 2017-03-14 LAB — POTASSIUM
POTASSIUM: 4.8 mmol/L (ref 3.5–5.1)
Potassium: 4.5 mmol/L (ref 3.5–5.1)

## 2017-03-14 LAB — ECHOCARDIOGRAM COMPLETE
CHL CUP DOP CALC LVOT VTI: 21.8 cm
E decel time: 143 msec
EERAT: 8.11
FS: 31 % (ref 28–44)
HEIGHTINCHES: 66 in
IVS/LV PW RATIO, ED: 0.91
LA ID, A-P, ES: 32 mm
LA vol index: 24.5 mL/m2
LA vol: 45.1 mL
LADIAMINDEX: 1.74 cm/m2
LAVOLA4C: 38.8 mL
LEFT ATRIUM END SYS DIAM: 32 mm
LV E/e' medial: 8.11
LVEEAVG: 8.11
LVELAT: 10.1 cm/s
LVOT area: 3.14 cm2
LVOT diameter: 20 mm
LVOT peak grad rest: 5 mmHg
LVOT peak vel: 115 cm/s
LVOTSV: 68 mL
MV Dec: 143
MVPG: 3 mmHg
MVPKAVEL: 98.8 m/s
MVPKEVEL: 81.9 m/s
PW: 9.7 mm — AB (ref 0.6–1.1)
RV LATERAL S' VELOCITY: 12.6 cm/s
TAPSE: 17.5 mm
TDI e' lateral: 10.1
TDI e' medial: 6.64
WEIGHTICAEL: 2634.94 [oz_av]

## 2017-03-14 LAB — BASIC METABOLIC PANEL
Anion gap: 11 (ref 5–15)
BUN: 23 mg/dL — ABNORMAL HIGH (ref 6–20)
CO2: 27 mmol/L (ref 22–32)
Calcium: 8.5 mg/dL — ABNORMAL LOW (ref 8.9–10.3)
Chloride: 98 mmol/L — ABNORMAL LOW (ref 101–111)
Creatinine, Ser: 6.97 mg/dL — ABNORMAL HIGH (ref 0.44–1.00)
GFR calc Af Amer: 8 mL/min — ABNORMAL LOW (ref >60)
GFR calc non Af Amer: 7 mL/min — ABNORMAL LOW (ref >60)
Glucose, Bld: 101 mg/dL — ABNORMAL HIGH (ref 65–99)
POTASSIUM: 4.1 mmol/L (ref 3.5–5.1)
SODIUM: 136 mmol/L (ref 135–145)

## 2017-03-14 LAB — CBC
HCT: 30.1 % — ABNORMAL LOW (ref 36.0–46.0)
Hemoglobin: 9.7 g/dL — ABNORMAL LOW (ref 12.0–15.0)
MCH: 29.9 pg (ref 26.0–34.0)
MCHC: 32.2 g/dL (ref 30.0–36.0)
MCV: 92.9 fL (ref 78.0–100.0)
PLATELETS: 139 10*3/uL — AB (ref 150–400)
RBC: 3.24 MIL/uL — ABNORMAL LOW (ref 3.87–5.11)
RDW: 15.9 % — AB (ref 11.5–15.5)
WBC: 9 10*3/uL (ref 4.0–10.5)

## 2017-03-14 LAB — HEPARIN LEVEL (UNFRACTIONATED): Heparin Unfractionated: 0.38 IU/mL (ref 0.30–0.70)

## 2017-03-14 LAB — HIV ANTIBODY (ROUTINE TESTING W REFLEX): HIV Screen 4th Generation wRfx: NONREACTIVE

## 2017-03-14 LAB — TROPONIN I
TROPONIN I: 0.21 ng/mL — AB (ref <0.03)
Troponin I: 0.28 ng/mL (ref <0.03)
Troponin I: 0.16 ng/mL (ref <0.03)

## 2017-03-14 MED ORDER — HEPARIN BOLUS VIA INFUSION
3000.0000 [IU] | Freq: Once | INTRAVENOUS | Status: AC
  Administered 2017-03-14: 3000 [IU] via INTRAVENOUS
  Filled 2017-03-14: qty 3000

## 2017-03-14 MED ORDER — IOPAMIDOL (ISOVUE-370) INJECTION 76%
INTRAVENOUS | Status: AC
  Administered 2017-03-14: 100 mL
  Filled 2017-03-14: qty 100

## 2017-03-14 MED ORDER — HEPARIN (PORCINE) IN NACL 100-0.45 UNIT/ML-% IJ SOLN
1200.0000 [IU]/h | INTRAMUSCULAR | Status: DC
  Administered 2017-03-14: 1100 [IU]/h via INTRAVENOUS
  Administered 2017-03-15 – 2017-03-17 (×2): 1250 [IU]/h via INTRAVENOUS
  Filled 2017-03-14 (×6): qty 250

## 2017-03-14 MED ORDER — LEVETIRACETAM 500 MG PO TABS
500.0000 mg | ORAL_TABLET | ORAL | Status: DC
  Administered 2017-03-17: 500 mg via ORAL
  Filled 2017-03-14: qty 1

## 2017-03-14 NOTE — Progress Notes (Signed)
ANTICOAGULATION CONSULT NOTE - Initial Consult  Pharmacy Consult for Heparin Indication: pulmonary embolus  No Known Allergies  Patient Measurements: Height: 5\' 6"  (167.6 cm) Weight: 164 lb 10.9 oz (74.7 kg) IBW/kg (Calculated) : 59.3   Vital Signs: Temp: 98.5 F (36.9 C) (10/27 1547) Temp Source: Oral (10/27 1547) BP: 131/96 (10/27 1548)  Labs:  Recent Labs  03/13/17 0854 03/13/17 1540 03/13/17 1754 03/14/17 0206 03/14/17 0744 03/14/17 1056 03/14/17 1903  HGB 11.0* 9.8*  --  9.7*  --   --   --   HCT 34.1* 31.5*  --  30.1*  --   --   --   PLT 164 150  --  139*  --   --   --   APTT 35  --   --   --   --   --   --   LABPROT 13.4  --   --   --   --   --   --   INR 1.03  --   --   --   --   --   --   HEPARINUNFRC  --   --   --   --   --   --  0.38  CREATININE 12.42*  --  12.96* 6.97*  --   --   --   TROPONINI  --   --   --  0.28* 0.21* 0.16*  --     Estimated Creatinine Clearance: 11.2 mL/min (A) (by C-G formula based on SCr of 6.97 mg/dL (H)).   Medical History: Past Medical History:  Diagnosis Date  . Anemia   . Blood transfusion   . Cervix carcinoma in situ Mar 2011  . CHF (congestive heart failure) ([place])   . Chronic kidney disease    T, Th, Sat  . Dialysis patient [facility])    tues, thursday, saturday.  . Gallstone pancreatitis Feb 2011  . Hypertension   . Lupus   . Lupus nephritis ([place])   . Pneumonia due to organism 03/02/2011  . Proteinuria - cause not known   . Renal failure, acute on chronic (HCC)   . Renal insufficiency   . Seizure disorder ([place]) 03/19/2013  . Thrombocytopenia [facility])    Assessment: 41 year old female with ESRD to begin heparin for a pulmonary embolus. HgB stable 9.7  Heparin level 0.38; no bleeding documented  Goal of Therapy:  Heparin level 0.3-0.7 units/ml Monitor platelets by anticoagulation protocol: Yes   Plan:  Continue heparin gtt at 1100 units/hr  Daily heparin level, CBC Monitor for s/sx of bleeding  Georga Bora, PharmD Clinical Pharmacist 03/14/2017 7:32 PM

## 2017-03-14 NOTE — Progress Notes (Addendum)
PROGRESS NOTE    Maria Vazquez  SEG:[id number] DOB: [birth year] DOA: 03/13/2017 PCP: Estanislado Emms, MD    Brief Narrative:  Maria Vazquez is a 41 y.o. female with medical history significant for end-stage renal disease on hemodialysis, lupus, hypertension, seizure disorder, and chronic anemia, now presenting to the emergency department with chest pain and dialysis access problem.  Patient reports completing dialysis on 03/10/2017, but was unable to on 03/12/2017 due to a clotted fistula.  She presented today for declot, which was unsuccessful, and she had a dialysis catheter placed.  Catheter was placed, she began to complain of chest pain, and was directed to the ED for further evaluation.  She had been hypertensive, given clonidine, but vomited it back and was later given another dose which she tolerated.  Upon arrival to the ED, patient is found to be afebrile, saturating well on 2 L/min of supplemental oxygen, hypertensive to 160/115, and with vitals otherwise stable.  EKG features a sinus rhythm with nonspecific ST and T abnormality.  Chest x-ray is notable for the new left-sided central venous catheter with tip overlying the right atrium, but no edema or infiltrate.  Chemistry panel reveals a potassium of 7.1, bicarbonate 19, BUN 60, and anion gap of 18.  CBC is notable for a slight leukocytosis to 10,900 and a chronic normocytic anemia with hemoglobin of 9.8.  Troponin was elevated to 0.59, improving slightly to 0.45 when repeated 2-1/2 hours later.  Patient was treated with continuous albuterol neb, D50%, 10 units IV NovoLog, and Dilaudid in the ED.  Cardiology has evaluated the patient in the emergency department and given recommendations.  Nephrology was also consulted by the ED physician for urgent dialysis.  Patient remains hypertensive, not in acute respiratory distress, and will be admitted to the stepdown unit for ongoing evaluation and management of hyperkalemia,  chest pain, elevated troponin, and hypertensive urgency, all suspected secondary to end-stage renal disease in need of dialysis.  Assessment & Plan:   Principal Problem:   Hyperkalemia Active Problems:   Anemia in ESRD (end-stage renal disease) (HCC)   Lupus (systemic lupus erythematosus) (HCC)   Chest pain at rest   Hypertensive urgency   End stage renal disease ([place])   Seizure disorder ([place])   Clotted dialysis access ([place])   ESRD, access problem, hyperkalemia, metabolic acidosis, hypertensive urgency  - Pt reports completing HD on 10/23, but had access problems on 10/25, had attempted declot just prior to admission and catheter was placed in left chest after procedure was unsuccessful  -Underwent emergent HD 10-26.  Hyperkalemia resolved.   Chest pain, elevated troponin  EKG with non-specific ST and T abnormalities  ECHO ordered.  Patient still with chest pain, near catheter, but also report pleuritic chest pain. Pain has been constant. Discussed with nephrologist will proceed with CT angio rule out PE.  Addendum; contacted by radiologist.. CT consistent with PE. Will start Heparin per pharmacy.    Seizure disorder  - Continue Keppra    SLE - Stable  - Continue Plaquenil   Anemia - Hgb is 9.8 on admission, down from recent priors in 11-11.5 range  - Recent drop likely secondary to declot procedure and hemodilution with missed HD  - Monitor with repeat CBC in am    DVT prophylaxis: heparin  Code Status: full code.  Family Communication: care discussed with patient.  Disposition Plan: remain in the step down.   Consultants:   Cardiology  Nephrology    Procedures:   Emergent  HD.   ECHO   Antimicrobials:   none   Subjective: Still complaining of chest pain, localized near HD catheter, but also pleuritic component.    Objective: Vitals:   03/14/17 0025 03/14/17 0030 03/14/17 0324 03/14/17 0500  BP: 119/87 110/81 116/87   Pulse:  72    Resp: 18 16  15    Temp: 97.9 F (36.6 C) 97.7 F (36.5 C) 97.8 F (36.6 C)   TempSrc: Oral Oral Oral   SpO2: 100% 98% 100%   Weight: 75.3 kg (166 lb 0.1 oz) 74.2 kg (163 lb 9.3 oz)  74.7 kg (164 lb 10.9 oz)  Height: 5\' 6"  (1.676 m)       Intake/Output Summary (Last 24 hours) at 03/14/17 0715 Last data filed at 03/14/17 0030  Gross per 24 hour  Intake              240 ml  Output             1000 ml  Net             -760 ml   Filed Weights   03/14/17 0025 03/14/17 0030 03/14/17 0500  Weight: 75.3 kg (166 lb 0.1 oz) 74.2 kg (163 lb 9.3 oz) 74.7 kg (164 lb 10.9 oz)    Examination:  General exam: Appears calm and comfortable  Respiratory system: Clear to auscultation. Respiratory effort normal. Cardiovascular system: S1 & S2 heard, RRR. No JVD, murmurs, rubs, gallops or clicks. No pedal edema. Gastrointestinal system: Abdomen is nondistended, soft and nontender. No organomegaly or masses felt. Normal bowel sounds heard. Central nervous system: Alert and oriented. No focal neurological deficits. Extremities: Symmetric 5 x 5 power. Right arm with dressing, site of fistula.  Skin: No rashes, lesions or ulcers Psychiatry: Judgement and insight appear normal. Mood & affect appropriate.     Data Reviewed: I have personally reviewed following labs and imaging studies  CBC:  Recent Labs Lab 03/13/17 0854 03/13/17 1540 03/14/17 0206  WBC 7.8 10.9* 9.0  NEUTROABS  --  9.1*  --   HGB 11.0* 9.8* 9.7*  HCT 34.1* 31.5* 30.1*  MCV 93.7 95.2 92.9  PLT 164 150 073*   Basic Metabolic Panel:  Recent Labs Lab 03/13/17 0854 03/13/17 1754 03/13/17 2319 03/14/17 0206  NA 139 136  --  136  K 5.1 7.1* 3.1* 4.1  CL 100* 99*  --  98*  CO2 24 19*  --  27  GLUCOSE 87 119*  --  101*  BUN 53* 60*  --  23*  CREATININE 12.42* 12.96*  --  6.97*  CALCIUM 8.7* 8.3*  --  8.5*   GFR: Estimated Creatinine Clearance: 11.2 mL/min (A) (by C-G formula based on SCr of 6.97 mg/dL (H)). Liver Function  Tests: No results for input(s): AST, ALT, ALKPHOS, BILITOT, PROT, ALBUMIN in the last 168 hours. No results for input(s): LIPASE, AMYLASE in the last 168 hours. No results for input(s): AMMONIA in the last 168 hours. Coagulation Profile:  Recent Labs Lab 03/13/17 0854  INR 1.03   Cardiac Enzymes:  Recent Labs Lab 03/14/17 0206  TROPONINI 0.28*   BNP (last 3 results) No results for input(s): PROBNP in the last 8760 hours. HbA1C: No results for input(s): HGBA1C in the last 72 hours. CBG:  Recent Labs Lab 03/13/17 1941  GLUCAP 125*   Lipid Profile: No results for input(s): CHOL, HDL, LDLCALC, TRIG, CHOLHDL, LDLDIRECT in the last 72 hours. Thyroid Function Tests: No results for input(s):  TSH, T4TOTAL, FREET4, T3FREE, THYROIDAB in the last 72 hours. Anemia Panel: No results for input(s): VITAMINB12, FOLATE, FERRITIN, TIBC, IRON, RETICCTPCT in the last 72 hours. Sepsis Labs: No results for input(s): PROCALCITON, LATICACIDVEN in the last 168 hours.  No results found for this or any previous visit (from the past 240 hour(s)).       Radiology Studies: Ir Fluoro Guide Cv Line Left  Result Date: 03/13/2017 CLINICAL DATA:  End-stage renal disease with occluded hemodialysis fistula which could not be successfully treated with percutaneous techniques. Intermediate term dialysis access is therefore needed. EXAM: TUNNELED HEMODIALYSIS CATHETER PLACEMENT WITH ULTRASOUND AND FLUOROSCOPIC GUIDANCE TECHNIQUE: The procedure, risks, benefits, and alternatives were explained to the patient with the aid of interpreter. Questions regarding the procedure were encouraged and answered. The patient understands and consents to the procedure. Patency of the right IJ vein could not be confirmed on ultrasound. Patency of the left IJ vein was confirmed with ultrasound with image documentation. An appropriate skin site was determined. Region was prepped using maximum barrier technique including cap and  mask, sterile gown, sterile gloves, large sterile sheet, and Chlorhexidine as cutaneous antisepsis. The region was infiltrated locally with 1% lidocaine. Intravenous Fentanyl and Versed were administered as conscious sedation during continuous monitoring of the patient's level of consciousness and physiological / cardiorespiratory status by the radiology RN, with a total moderate sedation time of 92 minutes including previous attempted declot. Under real-time ultrasound guidance, the left IJ vein was accessed with a 21 gauge micropuncture needle; the needle tip within the vein was confirmed with ultrasound image documentation. Needle exchanged over the 018 guidewire for transitional dilator, which allowed advancement of a Benson wire into the IVC. Over this, an MPA catheter was advanced. A Hemosplit 19 hemodialysis catheter was tunneled from the left anterior chest wall approach to the left IJ dermatotomy site. The MPA catheter was exchanged over an Amplatz wire for serial vascular dilators which allow placement of a peel-away sheath, through which the catheter was advanced under intermittent fluoroscopy, positioned with its tips in the proximal and midright atrium. Spot chest radiograph confirms good catheter position. No pneumothorax. Catheter was flushed and primed per protocol. Catheter secured externally with O Prolene sutures. The left IJ dermatotomy site was closed with Dermabond. COMPLICATIONS: COMPLICATIONS None immediate FLUOROSCOPY TIME:  10 minutes 24 seconds, 27 mGy COMPARISON:  None IMPRESSION: 1. Technically successful placement of tunneled left IJ hemodialysis catheter with ultrasound and fluoroscopic guidance. Ready for routine use. ACCESS: Remains approachable for percutaneous intervention as needed. Electronically Signed   By: Lucrezia Europe M.D.   On: 03/13/2017 13:34   Ir US Guide Vasc Access Left  Result Date: 03/13/2017 CLINICAL DATA:  End-stage renal disease with occluded hemodialysis  fistula which could not be successfully treated with percutaneous techniques. Intermediate term dialysis access is therefore needed. EXAM: TUNNELED HEMODIALYSIS CATHETER PLACEMENT WITH ULTRASOUND AND FLUOROSCOPIC GUIDANCE TECHNIQUE: The procedure, risks, benefits, and alternatives were explained to the patient with the aid of interpreter. Questions regarding the procedure were encouraged and answered. The patient understands and consents to the procedure. Patency of the right IJ vein could not be confirmed on ultrasound. Patency of the left IJ vein was confirmed with ultrasound with image documentation. An appropriate skin site was determined. Region was prepped using maximum barrier technique including cap and mask, sterile gown, sterile gloves, large sterile sheet, and Chlorhexidine as cutaneous antisepsis. The region was infiltrated locally with 1% lidocaine. Intravenous Fentanyl and Versed were administered as conscious  sedation during continuous monitoring of the patient's level of consciousness and physiological / cardiorespiratory status by the radiology RN, with a total moderate sedation time of 92 minutes including previous attempted declot. Under real-time ultrasound guidance, the left IJ vein was accessed with a 21 gauge micropuncture needle; the needle tip within the vein was confirmed with ultrasound image documentation. Needle exchanged over the 018 guidewire for transitional dilator, which allowed advancement of a Benson wire into the IVC. Over this, an MPA catheter was advanced. A Hemosplit 19 hemodialysis catheter was tunneled from the left anterior chest wall approach to the left IJ dermatotomy site. The MPA catheter was exchanged over an Amplatz wire for serial vascular dilators which allow placement of a peel-away sheath, through which the catheter was advanced under intermittent fluoroscopy, positioned with its tips in the proximal and midright atrium. Spot chest radiograph confirms good  catheter position. No pneumothorax. Catheter was flushed and primed per protocol. Catheter secured externally with O Prolene sutures. The left IJ dermatotomy site was closed with Dermabond. COMPLICATIONS: COMPLICATIONS None immediate FLUOROSCOPY TIME:  10 minutes 24 seconds, 27 mGy COMPARISON:  None IMPRESSION: 1. Technically successful placement of tunneled left IJ hemodialysis catheter with ultrasound and fluoroscopic guidance. Ready for routine use. ACCESS: Remains approachable for percutaneous intervention as needed. Electronically Signed   By: Lucrezia Europe M.D.   On: 03/13/2017 13:34   Ir US Guide Vasc Access Right  Result Date: 03/13/2017 CLINICAL DATA:  Occluded right upper arm hemodialysis fistula. EXAM: DIALYSIS FISTULA DECLOT (ATTEMPTED) VENOUS ANGIOPLASTY ULTRASOUND GUIDANCE FOR VASCULAR ACCESS FLUOROSCOPY TIME:  10 minutes 24 seconds, 27 mgy TECHNIQUE: The procedure, risks (including but not limited to bleeding, infection, organ damage ), benefits, and alternatives were explained to the patient with the aid of interpreter. Questions regarding the procedure were encouraged and answered. The patient understands and consents to the procedure. Intravenous Fentanyl and Versed were administered as conscious sedation during continuous monitoring of the patient's level of consciousness and physiological / cardiorespiratory status by the radiology RN, with a total moderate sedation time of 92 minutes. The venous outflow just central to the fistula was accessed antegrade with 21-gauge micropuncture needle under real-time ultrasonic guidance after the overlying skin prepped with Betadine, draped in usual sterile fashion, infiltrated locally with 1%lidocaine. Needle exchanged over 018guidewire for transitional dilator through which 4 mg t-PA was administered. Ultrasound images were stored. Through the antegrade dilator, a Bentson wire was advanced . Over this a 43F sheath was placed, through which a 5 [place] Kumpe  catheter was advanced for outflow venography. This showed patency of the central venous system through the SVC . A short segment high-grade stenosis in the cephalic arch just peripheral to its confluence with the axillary vein was identified. 3000 units heparin were administered IV. The Kumpe was exchanged over guide wire for the AngioJet device, used to mechanically thrombolyse the clot in the outflow vein and remove the thrombus from the native outflow. This was exchanged for a 14mm x 4 cm Mustang angioplasty balloon, advanced to the outflow vein. Angioplasty of the outflow vein and tandem venous stenoses was performed. Injection showed partial clearance of thrombus from the outflow vein with antegrade flow. No extravasation or apparent complication of angioplasty. The Angiojet catheter was then advanced again 2 further extract partially occlusive thrombus still in the aneurysmal segments of the outflow vein. Despite multiple passes with intermittent follow-up venography, good antegrade flow through the outflow vein could not be achieved. In fact, there is re-  configuration of thrombus such that there is near occlusion of the outflow vein in the aneurysmal segment just proximal to the return to more normal caliber centrally. Once the physiologic limited the Angiojet performance had been reached, the procedure was terminated. The catheter and sheath then removed and hemostasis achieved with 2-0 Ethilon suture . Patient tolerated procedure well. IMPRESSION: 1. Unsuccessful declot of right brachiocephalic fistula secondary to large amount of occlusive thrombus in the aneurysmal segment of the outflow cephalic vein. 2. Technically successful balloon angioplasty of central outflow venous stenosis. ACCESS: Plan for tunneled hemodialysis catheter placement to follow. Will need surgical fistula revision/ replacement. Electronically Signed   By: Lucrezia Europe M.D.   On: 03/13/2017 13:31   Dg Chest Port 1 View  Result Date:  03/13/2017 CLINICAL DATA:  Chest pain and shortness of breath EXAM: PORTABLE CHEST 1 VIEW COMPARISON:  02/14/2017 FINDINGS: Left-sided central venous catheter tip overlies the proximal right atrium. No pneumothorax. No acute consolidation or effusion. Borderline cardiomegaly. IMPRESSION: 1. Left-sided central venous catheter tip overlies the right atrium 2. Mildly low lung volumes.  Negative for edema or infiltrate. Electronically Signed   By: Donavan Foil M.D.   On: 03/13/2017 15:51   Ir Thrombectomy Av Fistula/w Thrombolysis/pta Inc Shunt/img Right  Result Date: 03/13/2017 CLINICAL DATA:  Occluded right upper arm hemodialysis fistula. EXAM: DIALYSIS FISTULA DECLOT (ATTEMPTED) VENOUS ANGIOPLASTY ULTRASOUND GUIDANCE FOR VASCULAR ACCESS FLUOROSCOPY TIME:  10 minutes 24 seconds, 27 mgy TECHNIQUE: The procedure, risks (including but not limited to bleeding, infection, organ damage ), benefits, and alternatives were explained to the patient with the aid of interpreter. Questions regarding the procedure were encouraged and answered. The patient understands and consents to the procedure. Intravenous Fentanyl and Versed were administered as conscious sedation during continuous monitoring of the patient's level of consciousness and physiological / cardiorespiratory status by the radiology RN, with a total moderate sedation time of 92 minutes. The venous outflow just central to the fistula was accessed antegrade with 21-gauge micropuncture needle under real-time ultrasonic guidance after the overlying skin prepped with Betadine, draped in usual sterile fashion, infiltrated locally with 1%lidocaine. Needle exchanged over 018guidewire for transitional dilator through which 4 mg t-PA was administered. Ultrasound images were stored. Through the antegrade dilator, a Bentson wire was advanced . Over this a 83F sheath was placed, through which a 5 [place] Kumpe catheter was advanced for outflow venography. This showed patency  of the central venous system through the SVC . A short segment high-grade stenosis in the cephalic arch just peripheral to its confluence with the axillary vein was identified. 3000 units heparin were administered IV. The Kumpe was exchanged over guide wire for the AngioJet device, used to mechanically thrombolyse the clot in the outflow vein and remove the thrombus from the native outflow. This was exchanged for a 16mm x 4 cm Mustang angioplasty balloon, advanced to the outflow vein. Angioplasty of the outflow vein and tandem venous stenoses was performed. Injection showed partial clearance of thrombus from the outflow vein with antegrade flow. No extravasation or apparent complication of angioplasty. The Angiojet catheter was then advanced again 2 further extract partially occlusive thrombus still in the aneurysmal segments of the outflow vein. Despite multiple passes with intermittent follow-up venography, good antegrade flow through the outflow vein could not be achieved. In fact, there is re- configuration of thrombus such that there is near occlusion of the outflow vein in the aneurysmal segment just proximal to the return to more normal caliber centrally.  Once the physiologic limited the Angiojet performance had been reached, the procedure was terminated. The catheter and sheath then removed and hemostasis achieved with 2-0 Ethilon suture . Patient tolerated procedure well. IMPRESSION: 1. Unsuccessful declot of right brachiocephalic fistula secondary to large amount of occlusive thrombus in the aneurysmal segment of the outflow cephalic vein. 2. Technically successful balloon angioplasty of central outflow venous stenosis. ACCESS: Plan for tunneled hemodialysis catheter placement to follow. Will need surgical fistula revision/ replacement. Electronically Signed   By: Lucrezia Europe M.D.   On: 03/13/2017 13:31        Scheduled Meds: . calcium acetate  667 mg Oral TID WC  . dextrose  1 ampule Intravenous  Once  . ferric citrate  630 mg Oral BID WC  . heparin  5,000 Units Subcutaneous Q8H  . hydroxychloroquine  200 mg Oral Daily  . insulin aspart  10 Units Intravenous Once  . levETIRAcetam  500 mg Oral BID  . levETIRAcetam  500 mg Oral Q T,Th,Sa-HD  . nortriptyline  50 mg Oral QHS  . promethazine      . sodium chloride flush  3 mL Intravenous Q12H  . sodium chloride flush  3 mL Intravenous Q12H   Continuous Infusions: . sodium chloride       LOS: 1 day    Time spent: 35 minutes.     Elmarie Shiley, MD [facility] Pager [phone]  If 7PM-7AM, please contact night-coverage [email or web address] Password [email or web address] 03/14/2017, 7:15 AM

## 2017-03-14 NOTE — Progress Notes (Signed)
Subjective:  Her breathing is reasonable and she tolerated dialysis fairly well yesterday.  She had unsuccessful declotting of her graft and there was a subsequent CAT scan that has shown bilateral pulmonary emboli.  Still some pain at the catheter site but no ischemic chest pain.  Objective:  Vital Signs in the last 24 hours: BP (!) 136/95   Pulse 72   Temp 97.8 F (36.6 C) (Oral)   Resp 17   Ht 5\' 6"  (1.676 m)   Wt 74.7 kg (164 lb 10.9 oz)   LMP 02/11/2017   SpO2 100%   BMI 26.58 kg/m   Physical Exam: Obese Hispanic female in no acute distress Lungs:  Clear Cardiac:  Regular rhythm, normal S1 and S2, no S3 Extremities:  No edema present  Intake/Output from previous day: 10/26 0701 - 10/27 0700 In: 240 [P.O.:240] Out: 1000   Weight Filed Weights   03/14/17 0025 03/14/17 0030 03/14/17 0500  Weight: 75.3 kg (166 lb 0.1 oz) 74.2 kg (163 lb 9.3 oz) 74.7 kg (164 lb 10.9 oz)    Lab Results: Basic Metabolic Panel:  Recent Labs  03/13/17 1754  03/14/17 0206 03/14/17 0744  NA 136  --  136  --   K 7.1*  < > 4.1 4.8  CL 99*  --  98*  --   CO2 19*  --  27  --   GLUCOSE 119*  --  101*  --   BUN 60*  --  23*  --   CREATININE 12.96*  --  6.97*  --   < > = values in this interval not displayed. CBC:  Recent Labs  03/13/17 1540 03/14/17 0206  WBC 10.9* 9.0  NEUTROABS 9.1*  --   HGB 9.8* 9.7*  HCT 31.5* 30.1*  MCV 95.2 92.9  PLT 150 139*   Cardiac Enzymes: Troponin (Point of Care Test)  Recent Labs  03/13/17 1808  TROPIPOC 0.45*   Cardiac Panel (last 3 results)  Recent Labs  03/14/17 0206 03/14/17 0744  TROPONINI 0.28* 0.21*    Telemetry: Sinus rhythm  Assessment/Plan:  1.  Acute pulmonary emboli likely related to recent declotting attempts of graft 2.  Elevation of troponin potentially due to pulmonary emboli 3.  End-stage renal disease 4.  Hypertensive heart disease  Recommendations:  Treatment for pulmonary emboli at this point.   Echocardiogram today to evaluate right heart component.  No additional cardiovascular workup necessary for the time being.       Kerry Hough  MD [facility] Cardiology  03/14/2017, 11:17 AM

## 2017-03-14 NOTE — Progress Notes (Signed)
[facility] Progress Note    Assessment/ Plan:   Assessment/Plan: 1. ESRD w/ hyperkalemia at presentation with last outpt HD on Tuesday. TTS @ [facility]. 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 CP may be from the placement of the IJ cath + ?PE (large clot burden) + volume overload + pain. - Tolerated  HD last night (10/26) with 1K bath for 2 hr and then 2 K bath.  - She does not  need another treatment  today.  - Will also monitor closely to determine if pt with significant PE; most likely pt never had great flows in the fistula with adherent thrombus + only a 11mm PTA and shouldn't have a massive PE --> CP better and the weak bruit is no longer present as expected and she has fully rethrombosed. 2. HTN - Better after HD yest (total of 1L net UF). 3. SLE - stable. 4. Anemia - Mircera 40mcg q2weeks. 5. HPTH - Calcitriol 0.3mcg TIW --> will give single dose today.    Subjective:   Feels better; CP much better. Denies f/c/n/v -> nausea resolved after the phenergan on dialysis yesterday. Still has some rt arm pain but no worse than yesterday after the procedure.   Objective:   BP 116/87 (BP Location: Left Arm)   Pulse 72   Temp 97.8 F (36.6 C) (Oral)   Resp 15   Ht 5\' 6"  (1.676 m)   Wt 74.7 kg (164 lb 10.9 oz)   LMP 02/11/2017   SpO2 100%   BMI 26.58 kg/m   Intake/Output Summary (Last 24 hours) at 03/14/17 0528 Last data filed at 03/14/17 0030  Gross per 24 hour  Intake              240 ml  Output             1000 ml  Net             -760 ml   Weight change:   Physical Exam: General appearance: alert, cooperative and appears stated age Head: Normocephalic, without obvious abnormality, atraumatic Back: symmetric, no curvature. ROM normal. No CVA tenderness. Resp: clear to auscultation bilaterally Cardio: regular rate and rhythm, S1, S2 normal, no murmur, click, rub or gallop GI: soft, non-tender; bowel sounds normal; no  masses,  no organomegaly Extremities: extremities normal, atraumatic, no cyanosis or edema Pulses: 2+ and symmetric HD Access: rt BCF thrombosed; very weak bruit no longer present  Imaging: Ir Fluoro Guide Cv Line Left  Result Date: 03/13/2017 CLINICAL DATA:  End-stage renal disease with occluded hemodialysis fistula which could not be successfully treated with percutaneous techniques. Intermediate term dialysis access is therefore needed. EXAM: TUNNELED HEMODIALYSIS CATHETER PLACEMENT WITH ULTRASOUND AND FLUOROSCOPIC GUIDANCE TECHNIQUE: The procedure, risks, benefits, and alternatives were explained to the patient with the aid of interpreter. Questions regarding the procedure were encouraged and answered. The patient understands and consents to the procedure. Patency of the right IJ vein could not be confirmed on ultrasound. Patency of the left IJ vein was confirmed with ultrasound with image documentation. An appropriate skin site was determined. Region was prepped using maximum barrier technique including cap and mask, sterile gown, sterile gloves, large sterile sheet, and Chlorhexidine as cutaneous antisepsis. The region was infiltrated locally with 1% lidocaine. Intravenous Fentanyl and Versed were administered as conscious sedation during continuous monitoring of the patient's level of consciousness and physiological / cardiorespiratory status by the radiology RN, with a total moderate sedation  time of 92 minutes including previous attempted declot. Under real-time ultrasound guidance, the left IJ vein was accessed with a 21 gauge micropuncture needle; the needle tip within the vein was confirmed with ultrasound image documentation. Needle exchanged over the 018 guidewire for transitional dilator, which allowed advancement of a Benson wire into the IVC. Over this, an MPA catheter was advanced. A Hemosplit 19 hemodialysis catheter was tunneled from the left anterior chest wall approach to the left IJ  dermatotomy site. The MPA catheter was exchanged over an Amplatz wire for serial vascular dilators which allow placement of a peel-away sheath, through which the catheter was advanced under intermittent fluoroscopy, positioned with its tips in the proximal and midright atrium. Spot chest radiograph confirms good catheter position. No pneumothorax. Catheter was flushed and primed per protocol. Catheter secured externally with O Prolene sutures. The left IJ dermatotomy site was closed with Dermabond. COMPLICATIONS: COMPLICATIONS None immediate FLUOROSCOPY TIME:  10 minutes 24 seconds, 27 mGy COMPARISON:  None IMPRESSION: 1. Technically successful placement of tunneled left IJ hemodialysis catheter with ultrasound and fluoroscopic guidance. Ready for routine use. ACCESS: Remains approachable for percutaneous intervention as needed. Electronically Signed   By: Lucrezia Europe M.D.   On: 03/13/2017 13:34   Ir US Guide Vasc Access Left  Result Date: 03/13/2017 CLINICAL DATA:  End-stage renal disease with occluded hemodialysis fistula which could not be successfully treated with percutaneous techniques. Intermediate term dialysis access is therefore needed. EXAM: TUNNELED HEMODIALYSIS CATHETER PLACEMENT WITH ULTRASOUND AND FLUOROSCOPIC GUIDANCE TECHNIQUE: The procedure, risks, benefits, and alternatives were explained to the patient with the aid of interpreter. Questions regarding the procedure were encouraged and answered. The patient understands and consents to the procedure. Patency of the right IJ vein could not be confirmed on ultrasound. Patency of the left IJ vein was confirmed with ultrasound with image documentation. An appropriate skin site was determined. Region was prepped using maximum barrier technique including cap and mask, sterile gown, sterile gloves, large sterile sheet, and Chlorhexidine as cutaneous antisepsis. The region was infiltrated locally with 1% lidocaine. Intravenous Fentanyl and Versed were  administered as conscious sedation during continuous monitoring of the patient's level of consciousness and physiological / cardiorespiratory status by the radiology RN, with a total moderate sedation time of 92 minutes including previous attempted declot. Under real-time ultrasound guidance, the left IJ vein was accessed with a 21 gauge micropuncture needle; the needle tip within the vein was confirmed with ultrasound image documentation. Needle exchanged over the 018 guidewire for transitional dilator, which allowed advancement of a Benson wire into the IVC. Over this, an MPA catheter was advanced. A Hemosplit 19 hemodialysis catheter was tunneled from the left anterior chest wall approach to the left IJ dermatotomy site. The MPA catheter was exchanged over an Amplatz wire for serial vascular dilators which allow placement of a peel-away sheath, through which the catheter was advanced under intermittent fluoroscopy, positioned with its tips in the proximal and midright atrium. Spot chest radiograph confirms good catheter position. No pneumothorax. Catheter was flushed and primed per protocol. Catheter secured externally with O Prolene sutures. The left IJ dermatotomy site was closed with Dermabond. COMPLICATIONS: COMPLICATIONS None immediate FLUOROSCOPY TIME:  10 minutes 24 seconds, 27 mGy COMPARISON:  None IMPRESSION: 1. Technically successful placement of tunneled left IJ hemodialysis catheter with ultrasound and fluoroscopic guidance. Ready for routine use. ACCESS: Remains approachable for percutaneous intervention as needed. Electronically Signed   By: Lucrezia Europe M.D.   On: 03/13/2017 13:34  Ir US Guide Vasc Access Right  Result Date: 03/13/2017 CLINICAL DATA:  Occluded right upper arm hemodialysis fistula. EXAM: DIALYSIS FISTULA DECLOT (ATTEMPTED) VENOUS ANGIOPLASTY ULTRASOUND GUIDANCE FOR VASCULAR ACCESS FLUOROSCOPY TIME:  10 minutes 24 seconds, 27 mgy TECHNIQUE: The procedure, risks (including but not  limited to bleeding, infection, organ damage ), benefits, and alternatives were explained to the patient with the aid of interpreter. Questions regarding the procedure were encouraged and answered. The patient understands and consents to the procedure. Intravenous Fentanyl and Versed were administered as conscious sedation during continuous monitoring of the patient's level of consciousness and physiological / cardiorespiratory status by the radiology RN, with a total moderate sedation time of 92 minutes. The venous outflow just central to the fistula was accessed antegrade with 21-gauge micropuncture needle under real-time ultrasonic guidance after the overlying skin prepped with Betadine, draped in usual sterile fashion, infiltrated locally with 1%lidocaine. Needle exchanged over 018guidewire for transitional dilator through which 4 mg t-PA was administered. Ultrasound images were stored. Through the antegrade dilator, a Bentson wire was advanced . Over this a 41F sheath was placed, through which a 5 [place] Kumpe catheter was advanced for outflow venography. This showed patency of the central venous system through the SVC . A short segment high-grade stenosis in the cephalic arch just peripheral to its confluence with the axillary vein was identified. 3000 units heparin were administered IV. The Kumpe was exchanged over guide wire for the AngioJet device, used to mechanically thrombolyse the clot in the outflow vein and remove the thrombus from the native outflow. This was exchanged for a 92mm x 4 cm Mustang angioplasty balloon, advanced to the outflow vein. Angioplasty of the outflow vein and tandem venous stenoses was performed. Injection showed partial clearance of thrombus from the outflow vein with antegrade flow. No extravasation or apparent complication of angioplasty. The Angiojet catheter was then advanced again 2 further extract partially occlusive thrombus still in the aneurysmal segments of the outflow  vein. Despite multiple passes with intermittent follow-up venography, good antegrade flow through the outflow vein could not be achieved. In fact, there is re- configuration of thrombus such that there is near occlusion of the outflow vein in the aneurysmal segment just proximal to the return to more normal caliber centrally. Once the physiologic limited the Angiojet performance had been reached, the procedure was terminated. The catheter and sheath then removed and hemostasis achieved with 2-0 Ethilon suture . Patient tolerated procedure well. IMPRESSION: 1. Unsuccessful declot of right brachiocephalic fistula secondary to large amount of occlusive thrombus in the aneurysmal segment of the outflow cephalic vein. 2. Technically successful balloon angioplasty of central outflow venous stenosis. ACCESS: Plan for tunneled hemodialysis catheter placement to follow. Will need surgical fistula revision/ replacement. Electronically Signed   By: Lucrezia Europe M.D.   On: 03/13/2017 13:31   Dg Chest Port 1 View  Result Date: 03/13/2017 CLINICAL DATA:  Chest pain and shortness of breath EXAM: PORTABLE CHEST 1 VIEW COMPARISON:  02/14/2017 FINDINGS: Left-sided central venous catheter tip overlies the proximal right atrium. No pneumothorax. No acute consolidation or effusion. Borderline cardiomegaly. IMPRESSION: 1. Left-sided central venous catheter tip overlies the right atrium 2. Mildly low lung volumes.  Negative for edema or infiltrate. Electronically Signed   By: Donavan Foil M.D.   On: 03/13/2017 15:51   Ir Thrombectomy Av Fistula/w Thrombolysis/pta Inc Shunt/img Right  Result Date: 03/13/2017 CLINICAL DATA:  Occluded right upper arm hemodialysis fistula. EXAM: DIALYSIS FISTULA DECLOT (ATTEMPTED) VENOUS ANGIOPLASTY ULTRASOUND  GUIDANCE FOR VASCULAR ACCESS FLUOROSCOPY TIME:  10 minutes 24 seconds, 27 mgy TECHNIQUE: The procedure, risks (including but not limited to bleeding, infection, organ damage ), benefits, and  alternatives were explained to the patient with the aid of interpreter. Questions regarding the procedure were encouraged and answered. The patient understands and consents to the procedure. Intravenous Fentanyl and Versed were administered as conscious sedation during continuous monitoring of the patient's level of consciousness and physiological / cardiorespiratory status by the radiology RN, with a total moderate sedation time of 92 minutes. The venous outflow just central to the fistula was accessed antegrade with 21-gauge micropuncture needle under real-time ultrasonic guidance after the overlying skin prepped with Betadine, draped in usual sterile fashion, infiltrated locally with 1%lidocaine. Needle exchanged over 018guidewire for transitional dilator through which 4 mg t-PA was administered. Ultrasound images were stored. Through the antegrade dilator, a Bentson wire was advanced . Over this a 36F sheath was placed, through which a 5 [place] Kumpe catheter was advanced for outflow venography. This showed patency of the central venous system through the SVC . A short segment high-grade stenosis in the cephalic arch just peripheral to its confluence with the axillary vein was identified. 3000 units heparin were administered IV. The Kumpe was exchanged over guide wire for the AngioJet device, used to mechanically thrombolyse the clot in the outflow vein and remove the thrombus from the native outflow. This was exchanged for a 54mm x 4 cm Mustang angioplasty balloon, advanced to the outflow vein. Angioplasty of the outflow vein and tandem venous stenoses was performed. Injection showed partial clearance of thrombus from the outflow vein with antegrade flow. No extravasation or apparent complication of angioplasty. The Angiojet catheter was then advanced again 2 further extract partially occlusive thrombus still in the aneurysmal segments of the outflow vein. Despite multiple passes with intermittent follow-up  venography, good antegrade flow through the outflow vein could not be achieved. In fact, there is re- configuration of thrombus such that there is near occlusion of the outflow vein in the aneurysmal segment just proximal to the return to more normal caliber centrally. Once the physiologic limited the Angiojet performance had been reached, the procedure was terminated. The catheter and sheath then removed and hemostasis achieved with 2-0 Ethilon suture . Patient tolerated procedure well. IMPRESSION: 1. Unsuccessful declot of right brachiocephalic fistula secondary to large amount of occlusive thrombus in the aneurysmal segment of the outflow cephalic vein. 2. Technically successful balloon angioplasty of central outflow venous stenosis. ACCESS: Plan for tunneled hemodialysis catheter placement to follow. Will need surgical fistula revision/ replacement. Electronically Signed   By: Lucrezia Europe M.D.   On: 03/13/2017 13:31    Labs: BMET  Recent Labs Lab 03/13/17 0854 03/13/17 1754 03/13/17 2319 03/14/17 0206  NA 139 136  --  136  K 5.1 7.1* 3.1* 4.1  CL 100* 99*  --  98*  CO2 24 19*  --  27  GLUCOSE 87 119*  --  101*  BUN 53* 60*  --  23*  CREATININE 12.42* 12.96*  --  6.97*  CALCIUM 8.7* 8.3*  --  8.5*   CBC  Recent Labs Lab 03/13/17 0854 03/13/17 1540 03/14/17 0206  WBC 7.8 10.9* 9.0  NEUTROABS  --  9.1*  --   HGB 11.0* 9.8* 9.7*  HCT 34.1* 31.5* 30.1*  MCV 93.7 95.2 92.9  PLT 164 150 139*    Medications:    . calcium acetate  667 mg Oral TID WC  .  dextrose  1 ampule Intravenous Once  . ferric citrate  630 mg Oral BID WC  . heparin  5,000 Units Subcutaneous Q8H  . hydroxychloroquine  200 mg Oral Daily  . insulin aspart  10 Units Intravenous Once  . levETIRAcetam  500 mg Oral BID  . levETIRAcetam  500 mg Oral Q T,Th,Sa-HD  . nortriptyline  50 mg Oral QHS  . promethazine      . sodium chloride flush  3 mL Intravenous Q12H  . sodium chloride flush  3 mL Intravenous Q12H       Otelia Santee, MD 03/14/2017, 5:28 AM

## 2017-03-14 NOTE — Progress Notes (Signed)
ANTICOAGULATION CONSULT NOTE - Initial Consult  Pharmacy Consult for Heparin Indication: pulmonary embolus  No Known Allergies  Patient Measurements: Height: 5\' 6"  (167.6 cm) Weight: 164 lb 10.9 oz (74.7 kg) IBW/kg (Calculated) : 59.3   Vital Signs: Temp: 97.8 F (36.6 C) (10/27 0803) Temp Source: Oral (10/27 0803) BP: 136/95 (10/27 0803) Pulse Rate: 72 (10/27 0030)  Labs:  Recent Labs  03/13/17 0854 03/13/17 1540 03/13/17 1754 03/14/17 0206 03/14/17 0744  HGB 11.0* 9.8*  --  9.7*  --   HCT 34.1* 31.5*  --  30.1*  --   PLT 164 150  --  139*  --   APTT 35  --   --   --   --   LABPROT 13.4  --   --   --   --   INR 1.03  --   --   --   --   CREATININE 12.42*  --  12.96* 6.97*  --   TROPONINI  --   --   --  0.28* 0.21*    Estimated Creatinine Clearance: 11.2 mL/min (A) (by C-G formula based on SCr of 6.97 mg/dL (H)).   Medical History: Past Medical History:  Diagnosis Date  . Anemia   . Blood transfusion   . Cervix carcinoma in situ Mar 2011  . CHF (congestive heart failure) ([place])   . Chronic kidney disease    T, Th, Sat  . Dialysis patient [facility])    tues, thursday, saturday.  . Gallstone pancreatitis Feb 2011  . Hypertension   . Lupus   . Lupus nephritis ([place])   . Pneumonia due to organism 03/02/2011  . Proteinuria - cause not known   . Renal failure, acute on chronic ([facility])   . Renal insufficiency   . Seizure disorder ([place]) 03/19/2013  . Thrombocytopenia [facility])      Assessment: 41 year old female with ESRD to begin heparin for a pulmonary embolus  Will need to start Warfarin soon  Goal of Therapy:  Heparin level 0.3-0.7 units/ml Monitor platelets by anticoagulation protocol: Yes   Plan:  Heparin 3000 units iv bolus x 1 Heparin drip at 1100 units / hr 8 hour heparin level Daily heparin level, CBC  Thank you Maria Vazquez, PharmD [phone]  Maria Vazquez 03/14/2017,10:26 AM

## 2017-03-14 NOTE — Progress Notes (Signed)
  Echocardiogram 2D Echocardiogram has been performed.  Maria Vazquez 03/14/2017, 3:29 PM

## 2017-03-15 LAB — RENAL FUNCTION PANEL
ALBUMIN: 3 g/dL — AB (ref 3.5–5.0)
Anion gap: 13 (ref 5–15)
BUN: 45 mg/dL — AB (ref 6–20)
CHLORIDE: 96 mmol/L — AB (ref 101–111)
CO2: 24 mmol/L (ref 22–32)
Calcium: 8.4 mg/dL — ABNORMAL LOW (ref 8.9–10.3)
Creatinine, Ser: 10.54 mg/dL — ABNORMAL HIGH (ref 0.44–1.00)
GFR calc Af Amer: 5 mL/min — ABNORMAL LOW (ref >60)
GFR calc non Af Amer: 4 mL/min — ABNORMAL LOW (ref >60)
GLUCOSE: 94 mg/dL (ref 65–99)
POTASSIUM: 5.3 mmol/L — AB (ref 3.5–5.1)
Phosphorus: 7.3 mg/dL — ABNORMAL HIGH (ref 2.5–4.6)
Sodium: 133 mmol/L — ABNORMAL LOW (ref 135–145)

## 2017-03-15 LAB — HEPARIN LEVEL (UNFRACTIONATED)
Heparin Unfractionated: 1.76 IU/mL — ABNORMAL HIGH (ref 0.30–0.70)
Heparin Unfractionated: 0.21 IU/mL — ABNORMAL LOW (ref 0.30–0.70)
Heparin Unfractionated: 2.06 IU/mL — ABNORMAL HIGH (ref 0.30–0.70)
Heparin Unfractionated: 0.5 IU/mL (ref 0.30–0.70)

## 2017-03-15 LAB — CBC
HCT: 29.9 % — ABNORMAL LOW (ref 36.0–46.0)
Hemoglobin: 9.6 g/dL — ABNORMAL LOW (ref 12.0–15.0)
MCH: 29.7 pg (ref 26.0–34.0)
MCHC: 32.1 g/dL (ref 30.0–36.0)
MCV: 92.6 fL (ref 78.0–100.0)
PLATELETS: 131 10*3/uL — AB (ref 150–400)
RBC: 3.23 MIL/uL — ABNORMAL LOW (ref 3.87–5.11)
RDW: 15.9 % — AB (ref 11.5–15.5)
WBC: 8.3 10*3/uL (ref 4.0–10.5)

## 2017-03-15 MED ORDER — PATIENT'S GUIDE TO USING COUMADIN BOOK
Freq: Once | Status: AC
  Administered 2017-03-15: 08:00:00
  Filled 2017-03-15: qty 1

## 2017-03-15 MED ORDER — PATIENT'S GUIDE TO USING COUMADIN BOOK
Freq: Once | Status: DC
  Filled 2017-03-15: qty 1

## 2017-03-15 MED ORDER — HEPARIN BOLUS VIA INFUSION
1500.0000 [IU] | Freq: Once | INTRAVENOUS | Status: AC
  Administered 2017-03-15: 1500 [IU] via INTRAVENOUS
  Filled 2017-03-15: qty 1500

## 2017-03-15 MED ORDER — WARFARIN SODIUM 7.5 MG PO TABS
7.5000 mg | ORAL_TABLET | Freq: Once | ORAL | Status: AC
  Administered 2017-03-15: 7.5 mg via ORAL
  Filled 2017-03-15: qty 1

## 2017-03-15 MED ORDER — WARFARIN - PHARMACIST DOSING INPATIENT
Freq: Every day | Status: DC
  Administered 2017-03-18: 18:00:00

## 2017-03-15 NOTE — Progress Notes (Signed)
ANTICOAGULATION CONSULT NOTE - Follow Up Consult  Pharmacy Consult for Heparin  Indication: pulmonary embolus  No Known Allergies  Patient Measurements: Height: 5\' 6"  (167.6 cm) Weight: 164 lb 10.9 oz (74.7 kg) IBW/kg (Calculated) : 59.3  Vital Signs: Temp: 97.8 F (36.6 C) (10/28 0355) Temp Source: Oral (10/28 0355) BP: 122/95 (10/28 0355) Pulse Rate: 88 (10/28 0355)  Labs:  Recent Labs  03/13/17 0854 03/13/17 1540 03/13/17 1754 03/14/17 0206 03/14/17 0744 03/14/17 1056 03/14/17 1903 03/15/17 0154 03/15/17 0333  HGB 11.0* 9.8*  --  9.7*  --   --   --  9.6*  --   HCT 34.1* 31.5*  --  30.1*  --   --   --  29.9*  --   PLT 164 150  --  139*  --   --   --  131*  --   APTT 35  --   --   --   --   --   --   --   --   LABPROT 13.4  --   --   --   --   --   --   --   --   INR 1.03  --   --   --   --   --   --   --   --   HEPARINUNFRC  --   --   --   --   --   --  0.38 1.76* 0.21*  CREATININE 12.42*  --  12.96* 6.97*  --   --   --   --   --   TROPONINI  --   --   --  0.28* 0.21* 0.16*  --   --   --     Estimated Creatinine Clearance: 11.2 mL/min (A) (by C-G formula based on SCr of 6.97 mg/dL (H)).   Assessment: Heparin for PE, heparin level is low this AM, no issues per RN.   Goal of Therapy:  Heparin level 0.3-0.7 units/ml Monitor platelets by anticoagulation protocol: Yes   Plan:  -Heparin 1500 units BOLUS -Inc heparin drip to 1250 units/hr -1300 HL  Nayda Riesen 03/15/2017,4:14 AM

## 2017-03-15 NOTE — Progress Notes (Signed)
ANTICOAGULATION CONSULT NOTE - Follow Up Consult  Pharmacy Consult for Heparin and warfarin Indication: pulmonary embolus  No Known Allergies  Patient Measurements: Height: 5\' 6"  (167.6 cm) Weight: 167 lb 5.3 oz (75.9 kg) IBW/kg (Calculated) : 59.3  Vital Signs: Temp: 98.3 F (36.8 C) (10/28 1600) Temp Source: Oral (10/28 1600) BP: 134/97 (10/28 1600)  Labs:  Recent Labs  03/13/17 0854 03/13/17 1540 03/13/17 1754 03/14/17 0206 03/14/17 0744 03/14/17 1056  03/15/17 0154 03/15/17 0333 03/15/17 1003 03/15/17 1510  HGB 11.0* 9.8*  --  9.7*  --   --   --  9.6*  --   --   --   HCT 34.1* 31.5*  --  30.1*  --   --   --  29.9*  --   --   --   PLT 164 150  --  139*  --   --   --  131*  --   --   --   APTT 35  --   --   --   --   --   --   --   --   --   --   LABPROT 13.4  --   --   --   --   --   --   --   --   --   --   INR 1.03  --   --   --   --   --   --   --   --   --   --   HEPARINUNFRC  --   --   --   --   --   --   < > 1.76* 0.21* 2.06* 0.50  CREATININE 12.42*  --  12.96* 6.97*  --   --   --   --   --  10.54*  --   TROPONINI  --   --   --  0.28* 0.21* 0.16*  --   --   --   --   --   < > = values in this interval not displayed.  Estimated Creatinine Clearance: 7.5 mL/min (A) (by C-G formula based on SCr of 10.54 mg/dL (H)).   Assessment: Heparin for PE. Heparin level was low this morning, rate adjusted. Follow up levels have been erratic due to protected right arm so unable to stick on opposite side of heparin infusion. Discussed with nurse and no bleeding issues noted, discussed with primary team and got approval for foot sticks. Given pulmonary embolus would like to be aggressive with heparin dosing which is currently difficult.  Heparin level (foot stick) is therapeutic at 0.5.   Goal of Therapy:  Heparin level 0.3-0.7 units/ml Monitor platelets by anticoagulation protocol: Yes   Plan:  Continue heparin drip at 1250 units/hr Confirmatory heparin level with am  labs   Vincenza Hews, PharmD, BCPS 03/15/2017, 6:31 PM

## 2017-03-15 NOTE — Progress Notes (Signed)
[facility] Progress Note    Assessment/ Plan:   1. ESRD w/ hyperkalemia at presentation with last outpt HD on Tuesday. TTS @ [place]. 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 CP may be from the placement of the IJ cath + ?PE (large clot burden) + volume overload + pain. - Tolerated  HD last night (10/26) with 1K bath for 2 hr and then 2 K bath.  - She does not  need another treatment  today.  - Will also monitor closely to determine if pt with significant PE; most likely pt never had great flows in the fistula with adherent thrombus + only a 79mm PTA and shouldn't have a massive PE --> CP better and the weak bruit is no longer present as expected and she has fully rethrombosed.   --> CT shows b/l PE at the segmental and subsegmental level. Tx with Coumadin and hep gtt.  - Plan on HD Mon and then will evaluate daily. Usually TTS as outpt but last tx was on Fri. 2. HTN - Better after HD Fri (total of 1L net UF). 3. SLE - stable. 4. Anemia - Mircera 47mcg q2weeks. 5. HPTH - Calcitriol 0.51mcg TIW --> will give single dose today.  Subjective:   Feels better with occasional and less frequent + less severe chest pain epigastric substernal no related to exertion, no change w/ inspiration or palpation.  Denies f/c/n/v, tolerating PO's.   Objective:   BP (!) 122/95 (BP Location: Left Arm)   Pulse 88   Temp 98.2 F (36.8 C) (Oral)   Resp 14   Ht 5\' 6"  (1.676 m)   Wt 75.9 kg (167 lb 5.3 oz)   LMP 02/11/2017   SpO2 100%   BMI 27.01 kg/m   Intake/Output Summary (Last 24 hours) at 03/15/17 1216 Last data filed at 03/15/17 0900  Gross per 24 hour  Intake           724.28 ml  Output                0 ml  Net           724.28 ml   Weight change: 7.861 kg (17 lb 5.3 oz)  Physical Exam: General appearance: alert, cooperative and appears stated age Head: Normocephalic, without obvious abnormality, atraumatic Back: symmetric, no curvature. ROM  normal. No CVA tenderness. Resp: clear to auscultation bilaterally Cardio: regular rate and rhythm, S1, S2 normal, no murmur, click, rub or gallop GI: soft, non-tender; bowel sounds normal; no masses, no organomegaly Extremities: extremities normal, atraumatic, no cyanosis or edema Pulses: 2+ and symmetric HD Access: rt BCF thrombosed; very weak bruit no longer present  Imaging: Ct Angio Chest Pe W Or Wo Contrast  Addendum Date: 03/14/2017   ADDENDUM REPORT: 03/14/2017 10:39 ADDENDUM: These results were called by telephone on 03/14/2017 at 10:20 am to Dr. Niel Hummer , who verbally acknowledged these results. Electronically Signed   By: Titus Dubin M.D.   On: 03/14/2017 10:39   Result Date: 03/14/2017 CLINICAL DATA:  Chest pain and shortness of breath. Evaluate for pulmonary embolism. EXAM: CT ANGIOGRAPHY CHEST WITH CONTRAST TECHNIQUE: Multidetector CT imaging of the chest was performed using the standard protocol during bolus administration of intravenous contrast. Multiplanar CT image reconstructions and MIPs were obtained to evaluate the vascular anatomy. CONTRAST:  100 cc Isovue 370 intravenous contrast. COMPARISON:  CT chest dated March 15, 2011. FINDINGS: Cardiovascular: Satisfactory opacification of the pulmonary arteries  to the segmental level. There are bilateral segmental and subsegmental pulmonary emboli involving the lingula, right upper lobe, and bilateral lower lobes. The RV/LV ratio is normal, measuring 0.73. Normal heart size. No pericardial effusion. Normal caliber thoracic aorta. Mediastinum/Nodes: No enlarged mediastinal, hilar, or axillary lymph nodes. Thyroid gland, trachea, and esophagus demonstrate no significant findings. Lungs/Pleura: Bibasilar atelectasis. Lungs are otherwise clear. No pleural effusion or pneumothorax. Upper Abdomen: No acute abnormality. Prior granulomatous disease in the spleen. Cholecystectomy. Musculoskeletal: No chest wall abnormality. No  acute or significant osseous findings. Partially visualized right arm AV fistula which appears at least partially thrombosed. Small amount of subcutaneous edema in the right axilla and along the proximal right upper arm. Review of the MIP images confirms the above findings. IMPRESSION: 1. Positive for acute segmental and subsegmental pulmonary emboli without CT evidence of right heart strain (RV/LV Ratio = 0.73). 2. Partially visualized right arm AV fistula which appears at least partially thrombosed. Radiology assistant personnel have been notified to put me in telephone contact with the referring physician or the referring physician's clinical representative in order to discuss these findings. Once this communication is established I will issue an addendum to this report for documentation purposes. Electronically Signed: By: Titus Dubin M.D. On: 03/14/2017 10:11   Ir Fluoro Guide Cv Line Left  Result Date: 03/13/2017 CLINICAL DATA:  End-stage renal disease with occluded hemodialysis fistula which could not be successfully treated with percutaneous techniques. Intermediate term dialysis access is therefore needed. EXAM: TUNNELED HEMODIALYSIS CATHETER PLACEMENT WITH ULTRASOUND AND FLUOROSCOPIC GUIDANCE TECHNIQUE: The procedure, risks, benefits, and alternatives were explained to the patient with the aid of interpreter. Questions regarding the procedure were encouraged and answered. The patient understands and consents to the procedure. Patency of the right IJ vein could not be confirmed on ultrasound. Patency of the left IJ vein was confirmed with ultrasound with image documentation. An appropriate skin site was determined. Region was prepped using maximum barrier technique including cap and mask, sterile gown, sterile gloves, large sterile sheet, and Chlorhexidine as cutaneous antisepsis. The region was infiltrated locally with 1% lidocaine. Intravenous Fentanyl and Versed were administered as conscious  sedation during continuous monitoring of the patient's level of consciousness and physiological / cardiorespiratory status by the radiology RN, with a total moderate sedation time of 92 minutes including previous attempted declot. Under real-time ultrasound guidance, the left IJ vein was accessed with a 21 gauge micropuncture needle; the needle tip within the vein was confirmed with ultrasound image documentation. Needle exchanged over the 018 guidewire for transitional dilator, which allowed advancement of a Benson wire into the IVC. Over this, an MPA catheter was advanced. A Hemosplit 19 hemodialysis catheter was tunneled from the left anterior chest wall approach to the left IJ dermatotomy site. The MPA catheter was exchanged over an Amplatz wire for serial vascular dilators which allow placement of a peel-away sheath, through which the catheter was advanced under intermittent fluoroscopy, positioned with its tips in the proximal and midright atrium. Spot chest radiograph confirms good catheter position. No pneumothorax. Catheter was flushed and primed per protocol. Catheter secured externally with O Prolene sutures. The left IJ dermatotomy site was closed with Dermabond. COMPLICATIONS: COMPLICATIONS None immediate FLUOROSCOPY TIME:  10 minutes 24 seconds, 27 mGy COMPARISON:  None IMPRESSION: 1. Technically successful placement of tunneled left IJ hemodialysis catheter with ultrasound and fluoroscopic guidance. Ready for routine use. ACCESS: Remains approachable for percutaneous intervention as needed. Electronically Signed   By: Eden Emms.D.  On: 03/13/2017 13:34   Ir US Guide Vasc Access Left  Result Date: 03/13/2017 CLINICAL DATA:  End-stage renal disease with occluded hemodialysis fistula which could not be successfully treated with percutaneous techniques. Intermediate term dialysis access is therefore needed. EXAM: TUNNELED HEMODIALYSIS CATHETER PLACEMENT WITH ULTRASOUND AND FLUOROSCOPIC GUIDANCE  TECHNIQUE: The procedure, risks, benefits, and alternatives were explained to the patient with the aid of interpreter. Questions regarding the procedure were encouraged and answered. The patient understands and consents to the procedure. Patency of the right IJ vein could not be confirmed on ultrasound. Patency of the left IJ vein was confirmed with ultrasound with image documentation. An appropriate skin site was determined. Region was prepped using maximum barrier technique including cap and mask, sterile gown, sterile gloves, large sterile sheet, and Chlorhexidine as cutaneous antisepsis. The region was infiltrated locally with 1% lidocaine. Intravenous Fentanyl and Versed were administered as conscious sedation during continuous monitoring of the patient's level of consciousness and physiological / cardiorespiratory status by the radiology RN, with a total moderate sedation time of 92 minutes including previous attempted declot. Under real-time ultrasound guidance, the left IJ vein was accessed with a 21 gauge micropuncture needle; the needle tip within the vein was confirmed with ultrasound image documentation. Needle exchanged over the 018 guidewire for transitional dilator, which allowed advancement of a Benson wire into the IVC. Over this, an MPA catheter was advanced. A Hemosplit 19 hemodialysis catheter was tunneled from the left anterior chest wall approach to the left IJ dermatotomy site. The MPA catheter was exchanged over an Amplatz wire for serial vascular dilators which allow placement of a peel-away sheath, through which the catheter was advanced under intermittent fluoroscopy, positioned with its tips in the proximal and midright atrium. Spot chest radiograph confirms good catheter position. No pneumothorax. Catheter was flushed and primed per protocol. Catheter secured externally with O Prolene sutures. The left IJ dermatotomy site was closed with Dermabond. COMPLICATIONS: COMPLICATIONS None  immediate FLUOROSCOPY TIME:  10 minutes 24 seconds, 27 mGy COMPARISON:  None IMPRESSION: 1. Technically successful placement of tunneled left IJ hemodialysis catheter with ultrasound and fluoroscopic guidance. Ready for routine use. ACCESS: Remains approachable for percutaneous intervention as needed. Electronically Signed   By: Lucrezia Europe M.D.   On: 03/13/2017 13:34   Ir US Guide Vasc Access Right  Result Date: 03/13/2017 CLINICAL DATA:  Occluded right upper arm hemodialysis fistula. EXAM: DIALYSIS FISTULA DECLOT (ATTEMPTED) VENOUS ANGIOPLASTY ULTRASOUND GUIDANCE FOR VASCULAR ACCESS FLUOROSCOPY TIME:  10 minutes 24 seconds, 27 mgy TECHNIQUE: The procedure, risks (including but not limited to bleeding, infection, organ damage ), benefits, and alternatives were explained to the patient with the aid of interpreter. Questions regarding the procedure were encouraged and answered. The patient understands and consents to the procedure. Intravenous Fentanyl and Versed were administered as conscious sedation during continuous monitoring of the patient's level of consciousness and physiological / cardiorespiratory status by the radiology RN, with a total moderate sedation time of 92 minutes. The venous outflow just central to the fistula was accessed antegrade with 21-gauge micropuncture needle under real-time ultrasonic guidance after the overlying skin prepped with Betadine, draped in usual sterile fashion, infiltrated locally with 1%lidocaine. Needle exchanged over 018guidewire for transitional dilator through which 4 mg t-PA was administered. Ultrasound images were stored. Through the antegrade dilator, a Bentson wire was advanced . Over this a 65F sheath was placed, through which a 5 [place] Kumpe catheter was advanced for outflow venography. This showed patency of the central  venous system through the SVC . A short segment high-grade stenosis in the cephalic arch just peripheral to its confluence with the axillary  vein was identified. 3000 units heparin were administered IV. The Kumpe was exchanged over guide wire for the AngioJet device, used to mechanically thrombolyse the clot in the outflow vein and remove the thrombus from the native outflow. This was exchanged for a 63mm x 4 cm Mustang angioplasty balloon, advanced to the outflow vein. Angioplasty of the outflow vein and tandem venous stenoses was performed. Injection showed partial clearance of thrombus from the outflow vein with antegrade flow. No extravasation or apparent complication of angioplasty. The Angiojet catheter was then advanced again 2 further extract partially occlusive thrombus still in the aneurysmal segments of the outflow vein. Despite multiple passes with intermittent follow-up venography, good antegrade flow through the outflow vein could not be achieved. In fact, there is re- configuration of thrombus such that there is near occlusion of the outflow vein in the aneurysmal segment just proximal to the return to more normal caliber centrally. Once the physiologic limited the Angiojet performance had been reached, the procedure was terminated. The catheter and sheath then removed and hemostasis achieved with 2-0 Ethilon suture . Patient tolerated procedure well. IMPRESSION: 1. Unsuccessful declot of right brachiocephalic fistula secondary to large amount of occlusive thrombus in the aneurysmal segment of the outflow cephalic vein. 2. Technically successful balloon angioplasty of central outflow venous stenosis. ACCESS: Plan for tunneled hemodialysis catheter placement to follow. Will need surgical fistula revision/ replacement. Electronically Signed   By: Lucrezia Europe M.D.   On: 03/13/2017 13:31   Dg Chest Port 1 View  Result Date: 03/13/2017 CLINICAL DATA:  Chest pain and shortness of breath EXAM: PORTABLE CHEST 1 VIEW COMPARISON:  02/14/2017 FINDINGS: Left-sided central venous catheter tip overlies the proximal right atrium. No pneumothorax. No  acute consolidation or effusion. Borderline cardiomegaly. IMPRESSION: 1. Left-sided central venous catheter tip overlies the right atrium 2. Mildly low lung volumes.  Negative for edema or infiltrate. Electronically Signed   By: Donavan Foil M.D.   On: 03/13/2017 15:51   Ir Thrombectomy Av Fistula/w Thrombolysis/pta Inc Shunt/img Right  Result Date: 03/13/2017 CLINICAL DATA:  Occluded right upper arm hemodialysis fistula. EXAM: DIALYSIS FISTULA DECLOT (ATTEMPTED) VENOUS ANGIOPLASTY ULTRASOUND GUIDANCE FOR VASCULAR ACCESS FLUOROSCOPY TIME:  10 minutes 24 seconds, 27 mgy TECHNIQUE: The procedure, risks (including but not limited to bleeding, infection, organ damage ), benefits, and alternatives were explained to the patient with the aid of interpreter. Questions regarding the procedure were encouraged and answered. The patient understands and consents to the procedure. Intravenous Fentanyl and Versed were administered as conscious sedation during continuous monitoring of the patient's level of consciousness and physiological / cardiorespiratory status by the radiology RN, with a total moderate sedation time of 92 minutes. The venous outflow just central to the fistula was accessed antegrade with 21-gauge micropuncture needle under real-time ultrasonic guidance after the overlying skin prepped with Betadine, draped in usual sterile fashion, infiltrated locally with 1%lidocaine. Needle exchanged over 018guidewire for transitional dilator through which 4 mg t-PA was administered. Ultrasound images were stored. Through the antegrade dilator, a Bentson wire was advanced . Over this a 2F sheath was placed, through which a 5 [place] Kumpe catheter was advanced for outflow venography. This showed patency of the central venous system through the SVC . A short segment high-grade stenosis in the cephalic arch just peripheral to its confluence with the axillary vein was identified. [place]  units heparin were administered IV.  The Kumpe was exchanged over guide wire for the AngioJet device, used to mechanically thrombolyse the clot in the outflow vein and remove the thrombus from the native outflow. This was exchanged for a 77mm x 4 cm Mustang angioplasty balloon, advanced to the outflow vein. Angioplasty of the outflow vein and tandem venous stenoses was performed. Injection showed partial clearance of thrombus from the outflow vein with antegrade flow. No extravasation or apparent complication of angioplasty. The Angiojet catheter was then advanced again 2 further extract partially occlusive thrombus still in the aneurysmal segments of the outflow vein. Despite multiple passes with intermittent follow-up venography, good antegrade flow through the outflow vein could not be achieved. In fact, there is re- configuration of thrombus such that there is near occlusion of the outflow vein in the aneurysmal segment just proximal to the return to more normal caliber centrally. Once the physiologic limited the Angiojet performance had been reached, the procedure was terminated. The catheter and sheath then removed and hemostasis achieved with 2-0 Ethilon suture . Patient tolerated procedure well. IMPRESSION: 1. Unsuccessful declot of right brachiocephalic fistula secondary to large amount of occlusive thrombus in the aneurysmal segment of the outflow cephalic vein. 2. Technically successful balloon angioplasty of central outflow venous stenosis. ACCESS: Plan for tunneled hemodialysis catheter placement to follow. Will need surgical fistula revision/ replacement. Electronically Signed   By: Lucrezia Europe M.D.   On: 03/13/2017 13:31    Labs: BMET  Recent Labs Lab 03/13/17 0854 03/13/17 1754 03/13/17 2319 03/14/17 0206 03/14/17 0744 03/14/17 1056 03/15/17 1003  NA 139 136  --  136  --   --  133*  K 5.1 7.1* 3.1* 4.1 4.8 4.5 5.3*  CL 100* 99*  --  98*  --   --  96*  CO2 24 19*  --  27  --   --  24  GLUCOSE 87 119*  --  101*  --   --   94  BUN 53* 60*  --  23*  --   --  45*  CREATININE 12.42* 12.96*  --  6.97*  --   --  10.54*  CALCIUM 8.7* 8.3*  --  8.5*  --   --  8.4*  PHOS  --   --   --   --   --   --  7.3*   CBC  Recent Labs Lab 03/13/17 0854 03/13/17 1540 03/14/17 0206 03/15/17 0154  WBC 7.8 10.9* 9.0 8.3  NEUTROABS  --  9.1*  --   --   HGB 11.0* 9.8* 9.7* 9.6*  HCT 34.1* 31.5* 30.1* 29.9*  MCV 93.7 95.2 92.9 92.6  PLT 164 150 139* 131*    Medications:    . calcium acetate  667 mg Oral TID WC  . dextrose  1 ampule Intravenous Once  . ferric citrate  630 mg Oral BID WC  . hydroxychloroquine  200 mg Oral Daily  . insulin aspart  10 Units Intravenous Once  . levETIRAcetam  500 mg Oral BID  . levETIRAcetam  500 mg Oral Q T,Th,Sa-HD  . nortriptyline  50 mg Oral QHS  . sodium chloride flush  3 mL Intravenous Q12H  . sodium chloride flush  3 mL Intravenous Q12H  . warfarin  7.5 mg Oral ONCE-1800  . Warfarin - Pharmacist Dosing Inpatient   Does not apply q1800      Otelia Santee, MD 03/15/2017, 12:16 PM

## 2017-03-15 NOTE — Progress Notes (Signed)
Subjective:  Still complaining of some left arm discomfort related to her previous catheter revision.  Echo yesterday showed lower limits of normal ejection fraction.  Questionable septal hypokinesis but endocardium not well seen so may be spurious.  Objective:  Vital Signs in the last 24 hours: BP (!) 122/95 (BP Location: Left Arm)   Pulse 88   Temp 98.2 F (36.8 C) (Oral)   Resp 14   Ht 5\' 6"  (1.676 m)   Wt 75.9 kg (167 lb 5.3 oz)   LMP 02/11/2017   SpO2 100%   BMI 27.01 kg/m   Physical Exam: Obese Hispanic female in no acute distress Lungs:  Clear Cardiac:  Regular rhythm, normal S1 and S2, no S3 Extremities:  No edema present  Intake/Output from previous day: 10/27 0701 - 10/28 0700 In: 422.8 [P.O.:370; I.V.:52.8] Out: -   Weight Filed Weights   03/14/17 0030 03/14/17 0500 03/15/17 0500  Weight: 74.2 kg (163 lb 9.3 oz) 74.7 kg (164 lb 10.9 oz) 75.9 kg (167 lb 5.3 oz)    Lab Results: Basic Metabolic Panel:  Recent Labs  03/13/17 1754  03/14/17 0206 03/14/17 0744 03/14/17 1056  NA 136  --  136  --   --   K 7.1*  < > 4.1 4.8 4.5  CL 99*  --  98*  --   --   CO2 19*  --  27  --   --   GLUCOSE 119*  --  101*  --   --   BUN 60*  --  23*  --   --   CREATININE 12.96*  --  6.97*  --   --   < > = values in this interval not displayed. CBC:  Recent Labs  03/13/17 1540 03/14/17 0206 03/15/17 0154  WBC 10.9* 9.0 8.3  NEUTROABS 9.1*  --   --   HGB 9.8* 9.7* 9.6*  HCT 31.5* 30.1* 29.9*  MCV 95.2 92.9 92.6  PLT 150 139* 131*   Cardiac Enzymes: Troponin (Point of Care Test)  Recent Labs  03/13/17 1808  TROPIPOC 0.45*   Cardiac Panel (last 3 results)  Recent Labs  03/14/17 0206 03/14/17 0744 03/14/17 1056  TROPONINI 0.28* 0.21* 0.16*    Telemetry: Sinus rhythm  Assessment/Plan:  1.  Acute pulmonary emboli likely related to recent declotting attempts of graft 2.  Elevation of troponin potentially due to pulmonary emboli 3.  End-stage renal  disease 4.  Hypertensive heart disease  Recommendations:  Echo did not show any significant right heart involvement with the pulmonary embolus.  Continue treatment for pulmonary emboli.  Continue treatment of hypertension and hemodialysis.      Kerry Hough  MD [facility] Cardiology  03/15/2017, 10:26 AM

## 2017-03-15 NOTE — Progress Notes (Addendum)
ANTICOAGULATION CONSULT NOTE - Follow Up Consult  Pharmacy Consult for Heparin and warfarin Indication: pulmonary embolus  No Known Allergies  Patient Measurements: Height: 5\' 6"  (167.6 cm) Weight: 167 lb 5.3 oz (75.9 kg) IBW/kg (Calculated) : 59.3  Vital Signs: Temp: 97.8 F (36.6 C) (10/28 0355) Temp Source: Oral (10/28 0355) BP: 122/95 (10/28 0355) Pulse Rate: 88 (10/28 0355)  Labs:  Recent Labs  03/13/17 0854 03/13/17 1540 03/13/17 1754 03/14/17 0206 03/14/17 0744 03/14/17 1056 03/14/17 1903 03/15/17 0154 03/15/17 0333  HGB 11.0* 9.8*  --  9.7*  --   --   --  9.6*  --   HCT 34.1* 31.5*  --  30.1*  --   --   --  29.9*  --   PLT 164 150  --  139*  --   --   --  131*  --   APTT 35  --   --   --   --   --   --   --   --   LABPROT 13.4  --   --   --   --   --   --   --   --   INR 1.03  --   --   --   --   --   --   --   --   HEPARINUNFRC  --   --   --   --   --   --  0.38 1.76* 0.21*  CREATININE 12.42*  --  12.96* 6.97*  --   --   --   --   --   TROPONINI  --   --   --  0.28* 0.21* 0.16*  --   --   --     Estimated Creatinine Clearance: 11.3 mL/min (A) (by C-G formula based on SCr of 6.97 mg/dL (H)).   Assessment: Heparin for PE. Heparin level was low this morning, rate adjusted. Follow up levels have been erratic due to protected right arm so unable to stick on opposite side of heparin infusion. Discussed with nurse and no bleeding issues noted, discussed with primary team and got approval for foot sticks. Given pulmonary embolus would like to be aggressive with heparin dosing which is currently difficult.  New orders to start warfarin tonight. Baseline INR 1.0.   Goal of Therapy:  Heparin level 0.3-0.7 units/ml Monitor platelets by anticoagulation protocol: Yes   Plan:  Continue heparin drip at 1250 units/hr Foot stick for heparin level now Warfarin 7.5mg  tonight  Erin Hearing PharmD., BCPS Clinical Pharmacist Pager [phone] 03/15/2017 7:13 AM

## 2017-03-15 NOTE — Progress Notes (Signed)
PROGRESS NOTE    Maria Vazquez  YIR:[id number] DOB: [birth year] DOA: 03/13/2017 PCP: Estanislado Emms, MD    Brief Narrative:  Maria Vazquez is a 41 y.o. female with medical history significant for end-stage renal disease on hemodialysis, lupus, hypertension, seizure disorder, and chronic anemia, now presenting to the emergency department with chest pain and dialysis access problem.  Patient reports completing dialysis on 03/10/2017, but was unable to on 03/12/2017 due to a clotted fistula.  She presented today for declot, which was unsuccessful, and she had a dialysis catheter placed.  Catheter was placed, she began to complain of chest pain, and was directed to the ED for further evaluation.  She had been hypertensive, given clonidine, but vomited it back and was later given another dose which she tolerated.  Upon arrival to the ED, patient is found to be afebrile, saturating well on 2 L/min of supplemental oxygen, hypertensive to 160/115, and with vitals otherwise stable.  EKG features a sinus rhythm with nonspecific ST and T abnormality.  Chest x-ray is notable for the new left-sided central venous catheter with tip overlying the right atrium, but no edema or infiltrate.  Chemistry panel reveals a potassium of 7.1, bicarbonate 19, BUN 60, and anion gap of 18.  CBC is notable for a slight leukocytosis to 10,900 and a chronic normocytic anemia with hemoglobin of 9.8.  Troponin was elevated to 0.59, improving slightly to 0.45 when repeated 2-1/2 hours later.  Patient was treated with continuous albuterol neb, D50%, 10 units IV NovoLog, and Dilaudid in the ED.  Cardiology has evaluated the patient in the emergency department and given recommendations.  Nephrology was also consulted by the ED physician for urgent dialysis.  Patient remains hypertensive, not in acute respiratory distress, and will be admitted to the stepdown unit for ongoing evaluation and management of hyperkalemia,  chest pain, elevated troponin, and hypertensive urgency, all suspected secondary to end-stage renal disease in need of dialysis.  Assessment & Plan:   Principal Problem:   Hyperkalemia Active Problems:   Anemia in ESRD (end-stage renal disease) (HCC)   Lupus (systemic lupus erythematosus) (HCC)   Chest pain at rest   Hypertensive urgency   End stage renal disease ([place])   Seizure disorder ([place])   Clotted dialysis access ([place])   ESRD, access problem, hyperkalemia, metabolic acidosis, hypertensive urgency  - Pt reports completing HD on 10/23, but had access problems on 10/25, had attempted declot just prior to admission and catheter was placed in left chest after procedure was unsuccessful  -Underwent emergent HD 10-26.  Hyperkalemia resolved.   Bilateral Pulmonary embolism; Chest pain, elevated troponin in setting of clotted fistula.  EKG with non-specific ST and T abnormalities  ECHO normal ef, normal right ventricular function, possible mild hypokinesis antero septum myocardium.  CT angio positive for bilateral PE.  Started IV heparin.  Start coumadin today.  History of lupus,  antiphospholipid syndrome test pending.  Chest pain improved 4/10.    Seizure disorder  - Continue Keppra    SLE - Stable  - Continue Plaquenil   Anemia - Hgb is 9.8 on admission, down from recent priors in 11-11.5 range  - Recent drop likely secondary to declot procedure and hemodilution with missed HD  -hb stable.   DVT prophylaxis: heparin  Code Status: full code.  Family Communication: care discussed with patient.  Disposition Plan: remain in the step down. Need PCP to help with coumadin   Consultants:   Cardiology  Nephrology  Procedures:   Emergent HD.   ECHO   Antimicrobials:   none   Subjective: Report improvement of chest pain 4/10. Denies dyspnea.  Complaining of right arm pain   Objective: Vitals:   03/14/17 2100 03/14/17 2344 03/15/17 0355 03/15/17 0500    BP: (!) 124/92 (!) 128/97 (!) 122/95   Pulse:   88   Resp: 16 14 15    Temp: 98.5 F (36.9 C) 98.5 F (36.9 C) 97.8 F (36.6 C)   TempSrc: Oral Oral Oral   SpO2: 100% 100% 100%   Weight:    75.9 kg (167 lb 5.3 oz)  Height:        Intake/Output Summary (Last 24 hours) at 03/15/17 5573 Last data filed at 03/14/17 1625  Gross per 24 hour  Intake            422.8 ml  Output                0 ml  Net            422.8 ml   Filed Weights   03/14/17 0030 03/14/17 0500 03/15/17 0500  Weight: 74.2 kg (163 lb 9.3 oz) 74.7 kg (164 lb 10.9 oz) 75.9 kg (167 lb 5.3 oz)    Examination:  General exam: NAD Respiratory system: CTA Cardiovascular system: S 1, S 2 RRR Gastrointestinal system: Bs present, soft,nt Central nervous system: non focal.  Extremities: Symmetric 5 x 5 power. Right arm with dressing, site of fistula.  Skin: No rashes, lesions or ulcers    Data Reviewed: I have personally reviewed following labs and imaging studies  CBC:  Recent Labs Lab 03/13/17 0854 03/13/17 1540 03/14/17 0206 03/15/17 0154  WBC 7.8 10.9* 9.0 8.3  NEUTROABS  --  9.1*  --   --   HGB 11.0* 9.8* 9.7* 9.6*  HCT 34.1* 31.5* 30.1* 29.9*  MCV 93.7 95.2 92.9 92.6  PLT 164 150 139* 220*   Basic Metabolic Panel:  Recent Labs Lab 03/13/17 0854 03/13/17 1754 03/13/17 2319 03/14/17 0206 03/14/17 0744 03/14/17 1056  NA 139 136  --  136  --   --   K 5.1 7.1* 3.1* 4.1 4.8 4.5  CL 100* 99*  --  98*  --   --   CO2 24 19*  --  27  --   --   GLUCOSE 87 119*  --  101*  --   --   BUN 53* 60*  --  23*  --   --   CREATININE 12.42* 12.96*  --  6.97*  --   --   CALCIUM 8.7* 8.3*  --  8.5*  --   --    GFR: Estimated Creatinine Clearance: 11.3 mL/min (A) (by C-G formula based on SCr of 6.97 mg/dL (H)). Liver Function Tests: No results for input(s): AST, ALT, ALKPHOS, BILITOT, PROT, ALBUMIN in the last 168 hours. No results for input(s): LIPASE, AMYLASE in the last 168 hours. No results for  input(s): AMMONIA in the last 168 hours. Coagulation Profile:  Recent Labs Lab 03/13/17 0854  INR 1.03   Cardiac Enzymes:  Recent Labs Lab 03/14/17 0206 03/14/17 0744 03/14/17 1056  TROPONINI 0.28* 0.21* 0.16*   BNP (last 3 results) No results for input(s): PROBNP in the last 8760 hours. HbA1C: No results for input(s): HGBA1C in the last 72 hours. CBG:  Recent Labs Lab 03/13/17 1941  GLUCAP 125*   Lipid Profile: No results for input(s): CHOL, HDL, LDLCALC, TRIG, CHOLHDL,  LDLDIRECT in the last 72 hours. Thyroid Function Tests: No results for input(s): TSH, T4TOTAL, FREET4, T3FREE, THYROIDAB in the last 72 hours. Anemia Panel: No results for input(s): VITAMINB12, FOLATE, FERRITIN, TIBC, IRON, RETICCTPCT in the last 72 hours. Sepsis Labs: No results for input(s): PROCALCITON, LATICACIDVEN in the last 168 hours.  Recent Results (from the past 240 hour(s))  MRSA PCR Screening     Status: None   Collection Time: 03/14/17 12:32 AM  Result Value Ref Range Status   MRSA by PCR NEGATIVE NEGATIVE Final    Comment:        The GeneXpert MRSA Assay (FDA approved for NASAL specimens only), is one component of a comprehensive MRSA colonization surveillance program. It is not intended to diagnose MRSA infection nor to guide or monitor treatment for MRSA infections.          Radiology Studies: Ct Angio Chest Pe W Or Wo Contrast  Addendum Date: 03/14/2017   ADDENDUM REPORT: 03/14/2017 10:39 ADDENDUM: These results were called by telephone on 03/14/2017 at 10:20 am to Dr. Niel Hummer , who verbally acknowledged these results. Electronically Signed   By: Titus Dubin M.D.   On: 03/14/2017 10:39   Result Date: 03/14/2017 CLINICAL DATA:  Chest pain and shortness of breath. Evaluate for pulmonary embolism. EXAM: CT ANGIOGRAPHY CHEST WITH CONTRAST TECHNIQUE: Multidetector CT imaging of the chest was performed using the standard protocol during bolus administration of  intravenous contrast. Multiplanar CT image reconstructions and MIPs were obtained to evaluate the vascular anatomy. CONTRAST:  100 cc Isovue 370 intravenous contrast. COMPARISON:  CT chest dated March 15, 2011. FINDINGS: Cardiovascular: Satisfactory opacification of the pulmonary arteries to the segmental level. There are bilateral segmental and subsegmental pulmonary emboli involving the lingula, right upper lobe, and bilateral lower lobes. The RV/LV ratio is normal, measuring 0.73. Normal heart size. No pericardial effusion. Normal caliber thoracic aorta. Mediastinum/Nodes: No enlarged mediastinal, hilar, or axillary lymph nodes. Thyroid gland, trachea, and esophagus demonstrate no significant findings. Lungs/Pleura: Bibasilar atelectasis. Lungs are otherwise clear. No pleural effusion or pneumothorax. Upper Abdomen: No acute abnormality. Prior granulomatous disease in the spleen. Cholecystectomy. Musculoskeletal: No chest wall abnormality. No acute or significant osseous findings. Partially visualized right arm AV fistula which appears at least partially thrombosed. Small amount of subcutaneous edema in the right axilla and along the proximal right upper arm. Review of the MIP images confirms the above findings. IMPRESSION: 1. Positive for acute segmental and subsegmental pulmonary emboli without CT evidence of right heart strain (RV/LV Ratio = 0.73). 2. Partially visualized right arm AV fistula which appears at least partially thrombosed. Radiology assistant personnel have been notified to put me in telephone contact with the referring physician or the referring physician's clinical representative in order to discuss these findings. Once this communication is established I will issue an addendum to this report for documentation purposes. Electronically Signed: By: Titus Dubin M.D. On: 03/14/2017 10:11   Ir Fluoro Guide Cv Line Left  Result Date: 03/13/2017 CLINICAL DATA:  End-stage renal disease with  occluded hemodialysis fistula which could not be successfully treated with percutaneous techniques. Intermediate term dialysis access is therefore needed. EXAM: TUNNELED HEMODIALYSIS CATHETER PLACEMENT WITH ULTRASOUND AND FLUOROSCOPIC GUIDANCE TECHNIQUE: The procedure, risks, benefits, and alternatives were explained to the patient with the aid of interpreter. Questions regarding the procedure were encouraged and answered. The patient understands and consents to the procedure. Patency of the right IJ vein could not be confirmed on ultrasound. Patency of the  left IJ vein was confirmed with ultrasound with image documentation. An appropriate skin site was determined. Region was prepped using maximum barrier technique including cap and mask, sterile gown, sterile gloves, large sterile sheet, and Chlorhexidine as cutaneous antisepsis. The region was infiltrated locally with 1% lidocaine. Intravenous Fentanyl and Versed were administered as conscious sedation during continuous monitoring of the patient's level of consciousness and physiological / cardiorespiratory status by the radiology RN, with a total moderate sedation time of 92 minutes including previous attempted declot. Under real-time ultrasound guidance, the left IJ vein was accessed with a 21 gauge micropuncture needle; the needle tip within the vein was confirmed with ultrasound image documentation. Needle exchanged over the 018 guidewire for transitional dilator, which allowed advancement of a Benson wire into the IVC. Over this, an MPA catheter was advanced. A Hemosplit 19 hemodialysis catheter was tunneled from the left anterior chest wall approach to the left IJ dermatotomy site. The MPA catheter was exchanged over an Amplatz wire for serial vascular dilators which allow placement of a peel-away sheath, through which the catheter was advanced under intermittent fluoroscopy, positioned with its tips in the proximal and midright atrium. Spot chest radiograph  confirms good catheter position. No pneumothorax. Catheter was flushed and primed per protocol. Catheter secured externally with O Prolene sutures. The left IJ dermatotomy site was closed with Dermabond. COMPLICATIONS: COMPLICATIONS None immediate FLUOROSCOPY TIME:  10 minutes 24 seconds, 27 mGy COMPARISON:  None IMPRESSION: 1. Technically successful placement of tunneled left IJ hemodialysis catheter with ultrasound and fluoroscopic guidance. Ready for routine use. ACCESS: Remains approachable for percutaneous intervention as needed. Electronically Signed   By: Lucrezia Europe M.D.   On: 03/13/2017 13:34   Ir US Guide Vasc Access Left  Result Date: 03/13/2017 CLINICAL DATA:  End-stage renal disease with occluded hemodialysis fistula which could not be successfully treated with percutaneous techniques. Intermediate term dialysis access is therefore needed. EXAM: TUNNELED HEMODIALYSIS CATHETER PLACEMENT WITH ULTRASOUND AND FLUOROSCOPIC GUIDANCE TECHNIQUE: The procedure, risks, benefits, and alternatives were explained to the patient with the aid of interpreter. Questions regarding the procedure were encouraged and answered. The patient understands and consents to the procedure. Patency of the right IJ vein could not be confirmed on ultrasound. Patency of the left IJ vein was confirmed with ultrasound with image documentation. An appropriate skin site was determined. Region was prepped using maximum barrier technique including cap and mask, sterile gown, sterile gloves, large sterile sheet, and Chlorhexidine as cutaneous antisepsis. The region was infiltrated locally with 1% lidocaine. Intravenous Fentanyl and Versed were administered as conscious sedation during continuous monitoring of the patient's level of consciousness and physiological / cardiorespiratory status by the radiology RN, with a total moderate sedation time of 92 minutes including previous attempted declot. Under real-time ultrasound guidance, the  left IJ vein was accessed with a 21 gauge micropuncture needle; the needle tip within the vein was confirmed with ultrasound image documentation. Needle exchanged over the 018 guidewire for transitional dilator, which allowed advancement of a Benson wire into the IVC. Over this, an MPA catheter was advanced. A Hemosplit 19 hemodialysis catheter was tunneled from the left anterior chest wall approach to the left IJ dermatotomy site. The MPA catheter was exchanged over an Amplatz wire for serial vascular dilators which allow placement of a peel-away sheath, through which the catheter was advanced under intermittent fluoroscopy, positioned with its tips in the proximal and midright atrium. Spot chest radiograph confirms good catheter position. No pneumothorax. Catheter was flushed  and primed per protocol. Catheter secured externally with O Prolene sutures. The left IJ dermatotomy site was closed with Dermabond. COMPLICATIONS: COMPLICATIONS None immediate FLUOROSCOPY TIME:  10 minutes 24 seconds, 27 mGy COMPARISON:  None IMPRESSION: 1. Technically successful placement of tunneled left IJ hemodialysis catheter with ultrasound and fluoroscopic guidance. Ready for routine use. ACCESS: Remains approachable for percutaneous intervention as needed. Electronically Signed   By: Lucrezia Europe M.D.   On: 03/13/2017 13:34   Ir US Guide Vasc Access Right  Result Date: 03/13/2017 CLINICAL DATA:  Occluded right upper arm hemodialysis fistula. EXAM: DIALYSIS FISTULA DECLOT (ATTEMPTED) VENOUS ANGIOPLASTY ULTRASOUND GUIDANCE FOR VASCULAR ACCESS FLUOROSCOPY TIME:  10 minutes 24 seconds, 27 mgy TECHNIQUE: The procedure, risks (including but not limited to bleeding, infection, organ damage ), benefits, and alternatives were explained to the patient with the aid of interpreter. Questions regarding the procedure were encouraged and answered. The patient understands and consents to the procedure. Intravenous Fentanyl and Versed were  administered as conscious sedation during continuous monitoring of the patient's level of consciousness and physiological / cardiorespiratory status by the radiology RN, with a total moderate sedation time of 92 minutes. The venous outflow just central to the fistula was accessed antegrade with 21-gauge micropuncture needle under real-time ultrasonic guidance after the overlying skin prepped with Betadine, draped in usual sterile fashion, infiltrated locally with 1%lidocaine. Needle exchanged over 018guidewire for transitional dilator through which 4 mg t-PA was administered. Ultrasound images were stored. Through the antegrade dilator, a Bentson wire was advanced . Over this a 50F sheath was placed, through which a 5 [place] Kumpe catheter was advanced for outflow venography. This showed patency of the central venous system through the SVC . A short segment high-grade stenosis in the cephalic arch just peripheral to its confluence with the axillary vein was identified. 3000 units heparin were administered IV. The Kumpe was exchanged over guide wire for the AngioJet device, used to mechanically thrombolyse the clot in the outflow vein and remove the thrombus from the native outflow. This was exchanged for a 37mm x 4 cm Mustang angioplasty balloon, advanced to the outflow vein. Angioplasty of the outflow vein and tandem venous stenoses was performed. Injection showed partial clearance of thrombus from the outflow vein with antegrade flow. No extravasation or apparent complication of angioplasty. The Angiojet catheter was then advanced again 2 further extract partially occlusive thrombus still in the aneurysmal segments of the outflow vein. Despite multiple passes with intermittent follow-up venography, good antegrade flow through the outflow vein could not be achieved. In fact, there is re- configuration of thrombus such that there is near occlusion of the outflow vein in the aneurysmal segment just proximal to the  return to more normal caliber centrally. Once the physiologic limited the Angiojet performance had been reached, the procedure was terminated. The catheter and sheath then removed and hemostasis achieved with 2-0 Ethilon suture . Patient tolerated procedure well. IMPRESSION: 1. Unsuccessful declot of right brachiocephalic fistula secondary to large amount of occlusive thrombus in the aneurysmal segment of the outflow cephalic vein. 2. Technically successful balloon angioplasty of central outflow venous stenosis. ACCESS: Plan for tunneled hemodialysis catheter placement to follow. Will need surgical fistula revision/ replacement. Electronically Signed   By: Lucrezia Europe M.D.   On: 03/13/2017 13:31   Dg Chest Port 1 View  Result Date: 03/13/2017 CLINICAL DATA:  Chest pain and shortness of breath EXAM: PORTABLE CHEST 1 VIEW COMPARISON:  02/14/2017 FINDINGS: Left-sided central venous catheter tip overlies  the proximal right atrium. No pneumothorax. No acute consolidation or effusion. Borderline cardiomegaly. IMPRESSION: 1. Left-sided central venous catheter tip overlies the right atrium 2. Mildly low lung volumes.  Negative for edema or infiltrate. Electronically Signed   By: Donavan Foil M.D.   On: 03/13/2017 15:51   Ir Thrombectomy Av Fistula/w Thrombolysis/pta Inc Shunt/img Right  Result Date: 03/13/2017 CLINICAL DATA:  Occluded right upper arm hemodialysis fistula. EXAM: DIALYSIS FISTULA DECLOT (ATTEMPTED) VENOUS ANGIOPLASTY ULTRASOUND GUIDANCE FOR VASCULAR ACCESS FLUOROSCOPY TIME:  10 minutes 24 seconds, 27 mgy TECHNIQUE: The procedure, risks (including but not limited to bleeding, infection, organ damage ), benefits, and alternatives were explained to the patient with the aid of interpreter. Questions regarding the procedure were encouraged and answered. The patient understands and consents to the procedure. Intravenous Fentanyl and Versed were administered as conscious sedation during continuous  monitoring of the patient's level of consciousness and physiological / cardiorespiratory status by the radiology RN, with a total moderate sedation time of 92 minutes. The venous outflow just central to the fistula was accessed antegrade with 21-gauge micropuncture needle under real-time ultrasonic guidance after the overlying skin prepped with Betadine, draped in usual sterile fashion, infiltrated locally with 1%lidocaine. Needle exchanged over 018guidewire for transitional dilator through which 4 mg t-PA was administered. Ultrasound images were stored. Through the antegrade dilator, a Bentson wire was advanced . Over this a 43F sheath was placed, through which a 5 [place] Kumpe catheter was advanced for outflow venography. This showed patency of the central venous system through the SVC . A short segment high-grade stenosis in the cephalic arch just peripheral to its confluence with the axillary vein was identified. 3000 units heparin were administered IV. The Kumpe was exchanged over guide wire for the AngioJet device, used to mechanically thrombolyse the clot in the outflow vein and remove the thrombus from the native outflow. This was exchanged for a 20mm x 4 cm Mustang angioplasty balloon, advanced to the outflow vein. Angioplasty of the outflow vein and tandem venous stenoses was performed. Injection showed partial clearance of thrombus from the outflow vein with antegrade flow. No extravasation or apparent complication of angioplasty. The Angiojet catheter was then advanced again 2 further extract partially occlusive thrombus still in the aneurysmal segments of the outflow vein. Despite multiple passes with intermittent follow-up venography, good antegrade flow through the outflow vein could not be achieved. In fact, there is re- configuration of thrombus such that there is near occlusion of the outflow vein in the aneurysmal segment just proximal to the return to more normal caliber centrally. Once the  physiologic limited the Angiojet performance had been reached, the procedure was terminated. The catheter and sheath then removed and hemostasis achieved with 2-0 Ethilon suture . Patient tolerated procedure well. IMPRESSION: 1. Unsuccessful declot of right brachiocephalic fistula secondary to large amount of occlusive thrombus in the aneurysmal segment of the outflow cephalic vein. 2. Technically successful balloon angioplasty of central outflow venous stenosis. ACCESS: Plan for tunneled hemodialysis catheter placement to follow. Will need surgical fistula revision/ replacement. Electronically Signed   By: Lucrezia Europe M.D.   On: 03/13/2017 13:31        Scheduled Meds: . calcium acetate  667 mg Oral TID WC  . dextrose  1 ampule Intravenous Once  . ferric citrate  630 mg Oral BID WC  . hydroxychloroquine  200 mg Oral Daily  . insulin aspart  10 Units Intravenous Once  . levETIRAcetam  500 mg Oral BID  .  levETIRAcetam  500 mg Oral Q T,Th,Sa-HD  . nortriptyline  50 mg Oral QHS  . sodium chloride flush  3 mL Intravenous Q12H  . sodium chloride flush  3 mL Intravenous Q12H   Continuous Infusions: . sodium chloride    . heparin 1,250 Units/hr (03/15/17 0627)     LOS: 2 days    Time spent: 35 minutes.     Elmarie Shiley, MD [facility] Pager [phone]  If 7PM-7AM, please contact night-coverage [email or web address] Password [email or web address] 03/15/2017, 7:09 AM

## 2017-03-16 ENCOUNTER — Other Ambulatory Visit: Payer: Self-pay

## 2017-03-16 LAB — CBC
HCT: 28.1 % — ABNORMAL LOW (ref 36.0–46.0)
Hemoglobin: 9 g/dL — ABNORMAL LOW (ref 12.0–15.0)
MCH: 29.1 pg (ref 26.0–34.0)
MCHC: 32 g/dL (ref 30.0–36.0)
MCV: 90.9 fL (ref 78.0–100.0)
PLATELETS: 156 10*3/uL (ref 150–400)
RBC: 3.09 MIL/uL — ABNORMAL LOW (ref 3.87–5.11)
RDW: 15.6 % — AB (ref 11.5–15.5)
WBC: 9.8 10*3/uL (ref 4.0–10.5)

## 2017-03-16 LAB — HEPARIN LEVEL (UNFRACTIONATED): Heparin Unfractionated: 0.46 IU/mL (ref 0.30–0.70)

## 2017-03-16 LAB — PROTIME-INR
INR: 1.17
PROTHROMBIN TIME: 14.8 s (ref 11.4–15.2)

## 2017-03-16 MED ORDER — HYDROCODONE-ACETAMINOPHEN 5-325 MG PO TABS
ORAL_TABLET | ORAL | Status: AC
Start: 2017-03-16 — End: 2017-03-16
  Filled 2017-03-16: qty 2

## 2017-03-16 MED ORDER — METOCLOPRAMIDE HCL 5 MG/ML IJ SOLN: 5.0000 mg | Freq: Once | INTRAMUSCULAR | Status: DC

## 2017-03-16 MED ORDER — DIPHENHYDRAMINE HCL 50 MG/ML IJ SOLN: 12.5000 mg | Freq: Once | INTRAMUSCULAR | Status: DC

## 2017-03-16 MED ORDER — SODIUM CHLORIDE 0.9 % IV SOLN: 100.0000 mL | INTRAVENOUS | Status: DC | PRN

## 2017-03-16 MED ORDER — HEPARIN SODIUM (PORCINE) 1000 UNIT/ML DIALYSIS: 1000.0000 [IU] | INTRAMUSCULAR | Status: DC | PRN

## 2017-03-16 MED ORDER — PATIENT'S GUIDE TO USING COUMADIN BOOK
Freq: Once | Status: AC
  Administered 2017-03-16: 1
  Filled 2017-03-16: qty 1

## 2017-03-16 MED ORDER — LIDOCAINE HCL (PF) 1 % IJ SOLN: 5.0000 mL | INTRAMUSCULAR | Status: DC | PRN

## 2017-03-16 MED ORDER — SENNOSIDES-DOCUSATE SODIUM 8.6-50 MG PO TABS
1.0000 | ORAL_TABLET | Freq: Every day | ORAL | Status: DC
  Administered 2017-03-16 – 2017-03-19 (×4): 1 via ORAL
  Filled 2017-03-16 (×4): qty 1

## 2017-03-16 MED ORDER — ALTEPLASE 2 MG IJ SOLR: 2.0000 mg | Freq: Once | INTRAMUSCULAR | Status: DC | PRN

## 2017-03-16 MED ORDER — LIDOCAINE-PRILOCAINE 2.5-2.5 % EX CREA: 1.0000 "application " | TOPICAL_CREAM | CUTANEOUS | Status: DC | PRN

## 2017-03-16 MED ORDER — PENTAFLUOROPROP-TETRAFLUOROETH EX AERO: 1.0000 "application " | INHALATION_SPRAY | CUTANEOUS | Status: DC | PRN

## 2017-03-16 MED ORDER — POLYETHYLENE GLYCOL 3350 17 G PO PACK: 17.0000 g | PACK | Freq: Every day | ORAL | Status: DC

## 2017-03-16 MED ORDER — KETOROLAC TROMETHAMINE 15 MG/ML IJ SOLN
15.0000 mg | Freq: Once | INTRAMUSCULAR | Status: AC
  Administered 2017-03-16: 15 mg via INTRAVENOUS
  Filled 2017-03-16: qty 1

## 2017-03-16 MED ORDER — WARFARIN SODIUM 7.5 MG PO TABS
7.5000 mg | ORAL_TABLET | Freq: Once | ORAL | Status: AC
  Administered 2017-03-16: 7.5 mg via ORAL
  Filled 2017-03-16: qty 1

## 2017-03-16 NOTE — Progress Notes (Addendum)
PROGRESS NOTE    Maria Vazquez  BOF:[id number] DOB: 02-22-1976 DOA: 03/13/2017 PCP: Estanislado Emms, MD    Brief Narrative:  Maria Vazquez is a 41 y.o. female with medical history significant for end-stage renal disease on hemodialysis, lupus, hypertension, seizure disorder, and chronic anemia, now presenting to the emergency department with chest pain and dialysis access problem.  Patient reports completing dialysis on 03/10/2017, but was unable to on 03/12/2017 due to a clotted fistula.  She presented today for declot, which was unsuccessful, and she had a dialysis catheter placed.  Catheter was placed, she began to complain of chest pain, and was directed to the ED for further evaluation.  She had been hypertensive, given clonidine, but vomited it back and was later given another dose which she tolerated.  Upon arrival to the ED, patient is found to be afebrile, saturating well on 2 L/min of supplemental oxygen, hypertensive to 160/115, and with vitals otherwise stable.  EKG features a sinus rhythm with nonspecific ST and T abnormality.  Chest x-ray is notable for the new left-sided central venous catheter with tip overlying the right atrium, but no edema or infiltrate.  Chemistry panel reveals a potassium of 7.1, bicarbonate 19, BUN 60, and anion gap of 18.  CBC is notable for a slight leukocytosis to 10,900 and a chronic normocytic anemia with hemoglobin of 9.8.  Troponin was elevated to 0.59, improving slightly to 0.45 when repeated 2-1/2 hours later.  Patient was treated with continuous albuterol neb, D50%, 10 units IV NovoLog, and Dilaudid in the ED.  Cardiology has evaluated the patient in the emergency department and given recommendations.  Nephrology was also consulted by the ED physician for urgent dialysis.  Patient remains hypertensive, not in acute respiratory distress, and will be admitted to the stepdown unit for ongoing evaluation and management of hyperkalemia,  chest pain, elevated troponin, and hypertensive urgency, all suspected secondary to end-stage renal disease in need of dialysis.  Assessment & Plan:   Principal Problem:   Hyperkalemia Active Problems:   Anemia in ESRD (end-stage renal disease) (HCC)   Lupus (systemic lupus erythematosus) (HCC)   Chest pain at rest   Hypertensive urgency   End stage renal disease ([place])   Seizure disorder ([place])   Clotted dialysis access ([place])   ESRD, access problem, hyperkalemia, metabolic acidosis, hypertensive urgency  - Pt reports completing HD on 10/23, but had access problems on 10/25, had attempted declot just prior to admission and catheter was placed in left chest after procedure was unsuccessful  -Underwent emergent HD 10-26.  Hyperkalemia correction during HD.  Plan for HD today.   Bilateral Pulmonary embolism; Chest pain, elevated troponin in setting of clotted fistula.  EKG with non-specific ST and T abnormalities  ECHO normal ef, normal right ventricular function, possible mild hypokinesis antero septum myocardium.  CT angio positive for bilateral PE.  Continue with IV heparin.  Continue with  Coumadin, day 2 over lap.  History of lupus,  antiphospholipid syndrome test pending.  Had an episode of chest pain l;ast night. EKG similar than before. No hypotension or hypoxemia.  Pain medications as needed. Start bowel regimen.  She is feeling well, chest pain improved.    Seizure disorder  - Continue Keppra    SLE - Stable  - Continue Plaquenil   Anemia - Hgb is 9.8 on admission, down from recent priors in 11-11.5 range  - Recent drop likely secondary to declot procedure and hemodilution with missed HD  -hb stable.  -  check anemia panel.    DVT prophylaxis: heparin  Code Status: full code.  Family Communication: care discussed with patient.  Disposition Plan: remain in the step down. Need PCP to help with coumadin   Consultants:   Cardiology  Nephrology    Procedures:    Emergent HD.   ECHO   Antimicrobials:   none   Subjective: Had an episode of chest pain last night, received norco. BP was not low, no hypoxemia.  She report improvement of chest pain and right arm pain.   Objective: Vitals:   03/16/17 0358 03/16/17 0400 03/16/17 0745 03/16/17 0800  BP:  117/79 121/85 119/81  Pulse:  94 80   Resp:  13 11 (!) 9  Temp:  98.1 F (36.7 C) 97.7 F (36.5 C)   TempSrc:  Oral Oral   SpO2:  100% 100% 100%  Weight: 80.1 kg (176 lb 9.4 oz)     Height:        Intake/Output Summary (Last 24 hours) at 03/16/17 0913 Last data filed at 03/16/17 0800  Gross per 24 hour  Intake             1020 ml  Output                0 ml  Net             1020 ml   Filed Weights   03/14/17 0500 03/15/17 0500 03/16/17 0358  Weight: 74.7 kg (164 lb 10.9 oz) 75.9 kg (167 lb 5.3 oz) 80.1 kg (176 lb 9.4 oz)    Examination:  General exam: NAD Respiratory system: CTA Cardiovascular system: S 1, S 2 RRR Gastrointestinal system: BS present, soft , no tender. Central nervous system: non focal.  Extremities: Symmetric power. . Right arm with dressing, site of fistula.  Skin: No rashes, lesions or ulcers    Data Reviewed: I have personally reviewed following labs and imaging studies  CBC:  Recent Labs Lab 03/13/17 0854 03/13/17 1540 03/14/17 0206 03/15/17 0154 03/16/17 0243  WBC 7.8 10.9* 9.0 8.3 9.8  NEUTROABS  --  9.1*  --   --   --   HGB 11.0* 9.8* 9.7* 9.6* 9.0*  HCT 34.1* 31.5* 30.1* 29.9* 28.1*  MCV 93.7 95.2 92.9 92.6 90.9  PLT 164 150 139* 131* 361   Basic Metabolic Panel:  Recent Labs Lab 03/13/17 0854 03/13/17 1754 03/13/17 2319 03/14/17 0206 03/14/17 0744 03/14/17 1056 03/15/17 1003  NA 139 136  --  136  --   --  133*  K 5.1 7.1* 3.1* 4.1 4.8 4.5 5.3*  CL 100* 99*  --  98*  --   --  96*  CO2 24 19*  --  27  --   --  24  GLUCOSE 87 119*  --  101*  --   --  94  BUN 53* 60*  --  23*  --   --  45*  CREATININE 12.42* 12.96*  --   6.97*  --   --  10.54*  CALCIUM 8.7* 8.3*  --  8.5*  --   --  8.4*  PHOS  --   --   --   --   --   --  7.3*   GFR: Estimated Creatinine Clearance: 7.6 mL/min (A) (by C-G formula based on SCr of 10.54 mg/dL (H)). Liver Function Tests:  Recent Labs Lab 03/15/17 1003  ALBUMIN 3.0*   No results for input(s): LIPASE, AMYLASE in the  last 168 hours. No results for input(s): AMMONIA in the last 168 hours. Coagulation Profile:  Recent Labs Lab 03/13/17 0854 03/16/17 0243  INR 1.03 1.17   Cardiac Enzymes:  Recent Labs Lab 03/14/17 0206 03/14/17 0744 03/14/17 1056  TROPONINI 0.28* 0.21* 0.16*   BNP (last 3 results) No results for input(s): PROBNP in the last 8760 hours. HbA1C: No results for input(s): HGBA1C in the last 72 hours. CBG:  Recent Labs Lab 03/13/17 1941  GLUCAP 125*   Lipid Profile: No results for input(s): CHOL, HDL, LDLCALC, TRIG, CHOLHDL, LDLDIRECT in the last 72 hours. Thyroid Function Tests: No results for input(s): TSH, T4TOTAL, FREET4, T3FREE, THYROIDAB in the last 72 hours. Anemia Panel: No results for input(s): VITAMINB12, FOLATE, FERRITIN, TIBC, IRON, RETICCTPCT in the last 72 hours. Sepsis Labs: No results for input(s): PROCALCITON, LATICACIDVEN in the last 168 hours.  Recent Results (from the past 240 hour(s))  MRSA PCR Screening     Status: None   Collection Time: 03/14/17 12:32 AM  Result Value Ref Range Status   MRSA by PCR NEGATIVE NEGATIVE Final    Comment:        The GeneXpert MRSA Assay (FDA approved for NASAL specimens only), is one component of a comprehensive MRSA colonization surveillance program. It is not intended to diagnose MRSA infection nor to guide or monitor treatment for MRSA infections.          Radiology Studies: Ct Angio Chest Pe W Or Wo Contrast  Addendum Date: 03/14/2017   ADDENDUM REPORT: 03/14/2017 10:39 ADDENDUM: These results were called by telephone on 03/14/2017 at 10:20 am to Dr. Niel Hummer  , who verbally acknowledged these results. Electronically Signed   By: Titus Dubin M.D.   On: 03/14/2017 10:39   Result Date: 03/14/2017 CLINICAL DATA:  Chest pain and shortness of breath. Evaluate for pulmonary embolism. EXAM: CT ANGIOGRAPHY CHEST WITH CONTRAST TECHNIQUE: Multidetector CT imaging of the chest was performed using the standard protocol during bolus administration of intravenous contrast. Multiplanar CT image reconstructions and MIPs were obtained to evaluate the vascular anatomy. CONTRAST:  100 cc Isovue 370 intravenous contrast. COMPARISON:  CT chest dated March 15, 2011. FINDINGS: Cardiovascular: Satisfactory opacification of the pulmonary arteries to the segmental level. There are bilateral segmental and subsegmental pulmonary emboli involving the lingula, right upper lobe, and bilateral lower lobes. The RV/LV ratio is normal, measuring 0.73. Normal heart size. No pericardial effusion. Normal caliber thoracic aorta. Mediastinum/Nodes: No enlarged mediastinal, hilar, or axillary lymph nodes. Thyroid gland, trachea, and esophagus demonstrate no significant findings. Lungs/Pleura: Bibasilar atelectasis. Lungs are otherwise clear. No pleural effusion or pneumothorax. Upper Abdomen: No acute abnormality. Prior granulomatous disease in the spleen. Cholecystectomy. Musculoskeletal: No chest wall abnormality. No acute or significant osseous findings. Partially visualized right arm AV fistula which appears at least partially thrombosed. Small amount of subcutaneous edema in the right axilla and along the proximal right upper arm. Review of the MIP images confirms the above findings. IMPRESSION: 1. Positive for acute segmental and subsegmental pulmonary emboli without CT evidence of right heart strain (RV/LV Ratio = 0.73). 2. Partially visualized right arm AV fistula which appears at least partially thrombosed. Radiology assistant personnel have been notified to put me in telephone contact with the  referring physician or the referring physician's clinical representative in order to discuss these findings. Once this communication is established I will issue an addendum to this report for documentation purposes. Electronically Signed: By: Titus Dubin M.D. On: 03/14/2017 10:11  Scheduled Meds: . calcium acetate  667 mg Oral TID WC  . dextrose  1 ampule Intravenous Once  . diphenhydrAMINE  12.5 mg Intravenous Once  . ferric citrate  630 mg Oral BID WC  . hydroxychloroquine  200 mg Oral Daily  . insulin aspart  10 Units Intravenous Once  . levETIRAcetam  500 mg Oral BID  . levETIRAcetam  500 mg Oral Q T,Th,Sa-HD  . metoCLOPramide (REGLAN) injection  5 mg Intravenous Once  . nortriptyline  50 mg Oral QHS  . sodium chloride flush  3 mL Intravenous Q12H  . sodium chloride flush  3 mL Intravenous Q12H  . warfarin  7.5 mg Oral ONCE-1800  . Warfarin - Pharmacist Dosing Inpatient   Does not apply q1800   Continuous Infusions: . sodium chloride    . heparin 1,250 Units/hr (03/16/17 0300)     LOS: 3 days    Time spent: 35 minutes.     Elmarie Shiley, MD [facility] Pager [phone]  If 7PM-7AM, please contact night-coverage [email or web address] Password [email or web address] 03/16/2017, 9:13 AM

## 2017-03-16 NOTE — Progress Notes (Signed)
ANTICOAGULATION CONSULT NOTE - Follow Up Consult  Pharmacy Consult for Heparin and warfarin Indication: pulmonary embolus  No Known Allergies  Patient Measurements: Height: 5\' 6"  (167.6 cm) Weight: 176 lb 9.4 oz (80.1 kg) IBW/kg (Calculated) : 59.3  Vital Signs: Temp: 97.7 F (36.5 C) (10/29 0745) Temp Source: Oral (10/29 0745) BP: 119/81 (10/29 0800) Pulse Rate: 80 (10/29 0745)  Labs:  Recent Labs  03/13/17 1754 03/14/17 0206 03/14/17 0744 03/14/17 1056  03/15/17 0154  03/15/17 1003 03/15/17 1510 03/16/17 0243  HGB  --  9.7*  --   --   --  9.6*  --   --   --  9.0*  HCT  --  30.1*  --   --   --  29.9*  --   --   --  28.1*  PLT  --  139*  --   --   --  131*  --   --   --  156  LABPROT  --   --   --   --   --   --   --   --   --  14.8  INR  --   --   --   --   --   --   --   --   --  1.17  HEPARINUNFRC  --   --   --   --   < > 1.76*  < > 2.06* 0.50 0.46  CREATININE 12.96* 6.97*  --   --   --   --   --  10.54*  --   --   TROPONINI  --  0.28* 0.21* 0.16*  --   --   --   --   --   --   < > = values in this interval not displayed.  Estimated Creatinine Clearance: 7.6 mL/min (A) (by C-G formula based on SCr of 10.54 mg/dL (H)).   Assessment: 41 yo female with ESRD, on IV heparin for new PE.  Coumadin added last night.   Given pulmonary embolus would like to be aggressive with heparin dosing which is currently difficult.  Obtaining labs has been difficult due to limited access, order in place to stick feet as needed.  Goal of Therapy:  Heparin level 0.3-0.7 units/ml Monitor platelets by anticoagulation protocol: Yes   Plan:  Continue heparin drip at 1250 units/hr Daily heparin level and CBC. Warfarin 7.5mg  again tonight. Will need Coumadin education prior to d/c.  Uvaldo Rising, BCPS  Clinical Pharmacist Pager [phone]  03/16/2017 9:17 AM

## 2017-03-16 NOTE — Progress Notes (Addendum)
[facility] Progress Note   Dialysis: TTS [facility] 3.5h   2/2.25  75.5kg   Hep 7800 -mircera 75 ug q 2wks -calc 0.25 ug tiw    Assessment:   1. RUE AVF malfunction - thrombosed with unsuccessful attempt to declot 10/26 by IR on day of admission, new L IJ TDC was placed. Procedure c/b bilat PE, see below.  2. Acute bilat pulm emboli - complication of attempted declot procedure. Stable, no further CP or SOB.  On IV heparin and will get coumadin.  3. ESRD TTS [place]. HD today off sched.  2. HTN/ vol  - BP's good. No BP meds at home. Up 5kg by wts.  3. SLE - stable. 4. Anemia - Mircera 21mcg q2weeks. 5. HPTH - Calcitriol 0.92mcg TIW    Plan - HD today off schedule. Short HD tomorrow to get back on sched.    Kelly Splinter MD [employer] pgr [phone]   03/16/2017, 11:06 AM     Subjective:   Stable, CP's better, no SOB or cough. No new c/o's.      Objective:   BP 119/81   Pulse 80   Temp 97.7 F (36.5 C) (Oral)   Resp (!) 9   Ht 5\' 6"  (1.676 m)   Wt 80.1 kg (176 lb 9.4 oz)   LMP 02/11/2017   SpO2 100%   BMI 28.50 kg/m   Intake/Output Summary (Last 24 hours) at 03/16/17 1106 Last data filed at 03/16/17 0800  Gross per 24 hour  Intake             1020 ml  Output                0 ml  Net             1020 ml   Weight change: 4.2 kg (9 lb 4.2 oz)  Physical Exam: General appearance: alert, cooperative and appears stated age Head: Normocephalic, without obvious abnormality, atraumatic Back: symmetric, no curvature. ROM normal. No CVA tenderness. Resp: clear to auscultation bilaterally Cardio: regular rate and rhythm, S1, S2 normal, no murmur, click, rub or gallop GI: soft, non-tender; bowel sounds normal; no masses, no organomegaly Extremities: extremities normal, atraumatic, no cyanosis or edema Pulses: 2+ and symmetric HD Access: rt BCF thrombosed; very weak bruit no longer present  Imaging: No results  found.  Labs: BMET  Recent Labs Lab 03/13/17 0854 03/13/17 1754 03/13/17 2319 03/14/17 0206 03/14/17 0744 03/14/17 1056 03/15/17 1003  NA 139 136  --  136  --   --  133*  K 5.1 7.1* 3.1* 4.1 4.8 4.5 5.3*  CL 100* 99*  --  98*  --   --  96*  CO2 24 19*  --  27  --   --  24  GLUCOSE 87 119*  --  101*  --   --  94  BUN 53* 60*  --  23*  --   --  45*  CREATININE 12.42* 12.96*  --  6.97*  --   --  10.54*  CALCIUM 8.7* 8.3*  --  8.5*  --   --  8.4*  PHOS  --   --   --   --   --   --  7.3*   CBC  Recent Labs Lab 03/13/17 1540 03/14/17 0206 03/15/17 0154 03/16/17 0243  WBC 10.9* 9.0 8.3 9.8  NEUTROABS 9.1*  --   --   --   HGB 9.8*  9.7* 9.6* 9.0*  HCT 31.5* 30.1* 29.9* 28.1*  MCV 95.2 92.9 92.6 90.9  PLT 150 139* 131* 156    Medications:    . calcium acetate  667 mg Oral TID WC  . dextrose  1 ampule Intravenous Once  . diphenhydrAMINE  12.5 mg Intravenous Once  . ferric citrate  630 mg Oral BID WC  . hydroxychloroquine  200 mg Oral Daily  . insulin aspart  10 Units Intravenous Once  . levETIRAcetam  500 mg Oral BID  . levETIRAcetam  500 mg Oral Q T,Th,Sa-HD  . metoCLOPramide (REGLAN) injection  5 mg Intravenous Once  . nortriptyline  50 mg Oral QHS  . patient's guide to using coumadin book   Does not apply Once  . senna-docusate  1 tablet Oral Daily  . sodium chloride flush  3 mL Intravenous Q12H  . sodium chloride flush  3 mL Intravenous Q12H  . warfarin  7.5 mg Oral ONCE-1800  . Warfarin - Pharmacist Dosing Inpatient   Does not apply [phone]

## 2017-03-16 NOTE — Progress Notes (Signed)
Pt cp of CP 7/10, BP 140/99, HR 112, RA. EKG done, ST on monitor. Gave PRN hydralazine 10 mg, PRN norco 2 tab. On call MD notified. No new orders at the moment. Will continue to monitor pt.

## 2017-03-16 NOTE — Progress Notes (Signed)
Progress Note  Patient Name: Maria Vazquez Date of Encounter: 03/16/2017  Primary Cardiologist:   Dr. Ellyn Hack  Subjective   No chest pain.  No SOB.    Inpatient Medications    Scheduled Meds: . calcium acetate  667 mg Oral TID WC  . dextrose  1 ampule Intravenous Once  . diphenhydrAMINE  12.5 mg Intravenous Once  . ferric citrate  630 mg Oral BID WC  . hydroxychloroquine  200 mg Oral Daily  . insulin aspart  10 Units Intravenous Once  . levETIRAcetam  500 mg Oral BID  . levETIRAcetam  500 mg Oral Q T,Th,Sa-HD  . metoCLOPramide (REGLAN) injection  5 mg Intravenous Once  . nortriptyline  50 mg Oral QHS  . patient's guide to using coumadin book   Does not apply Once  . senna-docusate  1 tablet Oral Daily  . sodium chloride flush  3 mL Intravenous Q12H  . sodium chloride flush  3 mL Intravenous Q12H  . warfarin  7.5 mg Oral ONCE-1800  . Warfarin - Pharmacist Dosing Inpatient   Does not apply q1800   Continuous Infusions: . sodium chloride    . heparin 1,250 Units/hr (03/16/17 0300)   PRN Meds: sodium chloride, acetaminophen **OR** acetaminophen, hydrALAZINE, HYDROcodone-acetaminophen, HYDROmorphone (DILAUDID) injection, ondansetron **OR** ondansetron (ZOFRAN) IV, sodium chloride flush   Vital Signs    Vitals:   03/16/17 0358 03/16/17 0400 03/16/17 0745 03/16/17 0800  BP:  117/79 121/85 119/81  Pulse:  94 80   Resp:  13 11 (!) 9  Temp:  98.1 F (36.7 C) 97.7 F (36.5 C)   TempSrc:  Oral Oral   SpO2:  100% 100% 100%  Weight: 176 lb 9.4 oz (80.1 kg)     Height:        Intake/Output Summary (Last 24 hours) at 03/16/17 1124 Last data filed at 03/16/17 0800  Gross per 24 hour  Intake             1020 ml  Output                0 ml  Net             1020 ml   Filed Weights   03/14/17 0500 03/15/17 0500 03/16/17 0358  Weight: 164 lb 10.9 oz (74.7 kg) 167 lb 5.3 oz (75.9 kg) 176 lb 9.4 oz (80.1 kg)    Telemetry    NSR - Personally Reviewed  ECG      NSR, rate 100, axis WNL, QT prolonged.  T wave inversion lateral leads unchanged from previous.  03/16/2017  - Personally Reviewed  Physical Exam   GEN: No acute distress.   Neck: No  JVD Cardiac: RRR, no murmurs, rubs, or gallops.  Respiratory: Clear  to auscultation bilaterally. GI: Soft, nontender, non-distended  MS: No  edema; No deformity. Neuro:  Nonfocal  Psych: Normal affect   Labs    Chemistry Recent Labs Lab 03/13/17 1754  03/14/17 0206 03/14/17 0744 03/14/17 1056 03/15/17 1003  NA 136  --  136  --   --  133*  K 7.1*  < > 4.1 4.8 4.5 5.3*  CL 99*  --  98*  --   --  96*  CO2 19*  --  27  --   --  24  GLUCOSE 119*  --  101*  --   --  94  BUN 60*  --  23*  --   --  45*  CREATININE  12.96*  --  6.97*  --   --  10.54*  CALCIUM 8.3*  --  8.5*  --   --  8.4*  ALBUMIN  --   --   --   --   --  3.0*  GFRNONAA 3*  --  7*  --   --  4*  GFRAA 4*  --  8*  --   --  5*  ANIONGAP 18*  --  11  --   --  13  < > = values in this interval not displayed.   Hematology Recent Labs Lab 03/14/17 0206 03/15/17 0154 03/16/17 0243  WBC 9.0 8.3 9.8  RBC 3.24* 3.23* 3.09*  HGB 9.7* 9.6* 9.0*  HCT 30.1* 29.9* 28.1*  MCV 92.9 92.6 90.9  MCH 29.9 29.7 29.1  MCHC 32.2 32.1 32.0  RDW 15.9* 15.9* 15.6*  PLT 139* 131* 156    Cardiac Enzymes Recent Labs Lab 03/14/17 0206 03/14/17 0744 03/14/17 1056  TROPONINI 0.28* 0.21* 0.16*    Recent Labs Lab 03/13/17 1532 03/13/17 1808  TROPIPOC 0.59* 0.45*     BNPNo results for input(s): BNP, PROBNP in the last 168 hours.   DDimer No results for input(s): DDIMER in the last 168 hours.   Radiology    No results found.  Cardiac Studies   03/14/17 Left ventricle:  The cavity size was normal. Systolic function was normal. The estimated ejection fraction was in the range of 50% to 55%.  Regional wall motion abnormalities:   Possible mild hypokinesis of the anteroseptal myocardium.   Patient Profile     42 y.o. female  with a history of ESRD on HD, HTN, Lupus, and chronic anemia who is being seen for an evaluation of elevated troponin at the request of Dr. Eulis Foster. She presented to the ED with nausea, elevated BP.   Assessment & Plan    ELEVATED TROPONIN:  Not thought to be an acute coronary syndrome.  No significant wall motion abnormality although windows were not excellent.  No further cardiac work up.  Please call with further questions.    PE:  Could explain chest pain and elevated troponin.  There is an appropriate plan for anticoagulation.    Signed, Minus Breeding, MD  03/16/2017, 11:24 AM

## 2017-03-17 ENCOUNTER — Other Ambulatory Visit: Payer: Self-pay

## 2017-03-17 ENCOUNTER — Inpatient Hospital Stay (HOSPITAL_COMMUNITY): Payer: Medicaid Other

## 2017-03-17 LAB — BASIC METABOLIC PANEL
ANION GAP: 12 (ref 5–15)
BUN: 24 mg/dL — ABNORMAL HIGH (ref 6–20)
CHLORIDE: 95 mmol/L — AB (ref 101–111)
CO2: 25 mmol/L (ref 22–32)
Calcium: 8.8 mg/dL — ABNORMAL LOW (ref 8.9–10.3)
Creatinine, Ser: 7.59 mg/dL — ABNORMAL HIGH (ref 0.44–1.00)
GFR calc non Af Amer: 6 mL/min — ABNORMAL LOW (ref >60)
GFR, EST AFRICAN AMERICAN: 7 mL/min — AB (ref >60)
Glucose, Bld: 96 mg/dL (ref 65–99)
POTASSIUM: 4.9 mmol/L (ref 3.5–5.1)
Sodium: 132 mmol/L — ABNORMAL LOW (ref 135–145)

## 2017-03-17 LAB — IRON AND TIBC
Iron: 51 ug/dL (ref 28–170)
Saturation Ratios: 38 % — ABNORMAL HIGH (ref 10.4–31.8)
TIBC: 133 ug/dL — ABNORMAL LOW (ref 250–450)
UIBC: 82 ug/dL

## 2017-03-17 LAB — FERRITIN: FERRITIN: 4680 ng/mL — AB (ref 11–307)

## 2017-03-17 LAB — FOLATE: Folate: 11.4 ng/mL (ref >5.9)

## 2017-03-17 LAB — CBC
HCT: 28 % — ABNORMAL LOW (ref 36.0–46.0)
Hemoglobin: 8.8 g/dL — ABNORMAL LOW (ref 12.0–15.0)
MCH: 29.1 pg (ref 26.0–34.0)
MCHC: 31.4 g/dL (ref 30.0–36.0)
MCV: 92.7 fL (ref 78.0–100.0)
Platelets: 169 10*3/uL (ref 150–400)
RBC: 3.02 MIL/uL — ABNORMAL LOW (ref 3.87–5.11)
RDW: 16.1 % — ABNORMAL HIGH (ref 11.5–15.5)
WBC: 9.6 10*3/uL (ref 4.0–10.5)

## 2017-03-17 LAB — PROTIME-INR
INR: 1.67
Prothrombin Time: 19.5 s — ABNORMAL HIGH (ref 11.4–15.2)

## 2017-03-17 LAB — RETICULOCYTES
RBC.: 3.02 MIL/uL — ABNORMAL LOW (ref 3.87–5.11)
RETIC COUNT ABSOLUTE: 66.4 10*3/uL (ref 19.0–186.0)
Retic Ct Pct: 2.2 % (ref 0.4–3.1)

## 2017-03-17 LAB — HEMOGLOBIN AND HEMATOCRIT, BLOOD
HCT: 28.9 % — ABNORMAL LOW (ref 36.0–46.0)
HEMOGLOBIN: 9.5 g/dL — AB (ref 12.0–15.0)

## 2017-03-17 LAB — HEPARIN LEVEL (UNFRACTIONATED)
Heparin Unfractionated: 1.28 [IU]/mL — ABNORMAL HIGH (ref 0.30–0.70)
Heparin Unfractionated: 0.48 [IU]/mL (ref 0.30–0.70)

## 2017-03-17 LAB — VITAMIN B12: Vitamin B-12: 335 pg/mL (ref 180–914)

## 2017-03-17 LAB — TROPONIN I: Troponin I: 0.13 ng/mL (ref <0.03)

## 2017-03-17 MED ORDER — WARFARIN SODIUM 5 MG PO TABS
5.0000 mg | ORAL_TABLET | Freq: Once | ORAL | Status: AC
  Administered 2017-03-17: 5 mg via ORAL
  Filled 2017-03-17: qty 1

## 2017-03-17 MED ORDER — NITROGLYCERIN 0.4 MG SL SUBL: 0.4000 mg | SUBLINGUAL_TABLET | SUBLINGUAL | Status: DC | PRN

## 2017-03-17 NOTE — Progress Notes (Signed)
Patient ID: Maria Vazquez, female   DOB: 1975/06/13, 41 y.o.   MRN: [id number]   Dr Tyrell Antonio calls to inform me of bleeding site of left HD catheter. Pt on Heparin secondary B PE  Sat pt up in bed Held pressure x 10 minutes No bleeding at this time  Redressed and added pressure dressing Site is clean and dry  RN in room with me Pt tolerated well

## 2017-03-17 NOTE — Care Management (Addendum)
CM was able to make appt with [facility] on 03/23/17 at 10:30 - [place] will manage coumadin dosage and specified INR schedules.- per office at no cost. Office request INR specifics be ordered and office will retrieve from Epic (CM will request from attending). CM also arranged for pt to have new PCP follow up in [place] and provided free clinic of [place] information on AVS.  Pt is independent from home with family and denies transportation issues.  Pt may need to be MATCH at discharge.  CM will continue to follow for discharge needs  Pt only has emergency medicaid - per CSW this type of medicaid will not cover PCP visit nor labs.  Pt is to discharge home on coumadin and will need scheduled coumadin check with active medication management dependent upon the lab results .  CM left voicemail for Kristi with [facility].  CM also left voicemail for office manager with the [facility].

## 2017-03-17 NOTE — Progress Notes (Signed)
[facility] Progress Note   Dialysis: TTS [facility] 3.5h   2/2.25  75.5kg   Hep 7800 -mircera 75 ug q 2wks -calc 0.25 ug tiw    Assessment:   1. Clotted R AVF - sp unsuccessful attempt to declot 10/26 by IR on day of admission, new L IJ TDC was placed. Procedure c/b bilat PE, see below.  2. Acute bilat pulm emboli - complication of attempted declot procedure. Stable, no further CP or SOB.  On IV heparin and will get coumadin.  3. ESRD TTS [place]. HD today off sched.  2. HTN/ vol  - BP's good. No BP meds at home. Up 2kg by wts. Stable vol on exam.  3. SLE - stable. 4. Anemia - Mircera 89mcg q2weeks. 5. HPTH - Calcitriol 0.75mcg TIW    Plan - short HD today to get back on schedule, UF 1-2 L   Kelly Splinter MD [employer] pgr [phone]   03/17/2017, 9:26 AM     Subjective:   Stable, CP's better, no SOB or cough. No new c/o's.      Objective:   BP 122/84 (BP Location: Left Arm)   Pulse (!) 102   Temp 98.3 F (36.8 C) (Oral)   Resp 15   Ht 5\' 6"  (1.676 m)   Wt 77 kg (169 lb 12.1 oz)   LMP 02/11/2017   SpO2 99%   BMI 27.40 kg/m   Intake/Output Summary (Last 24 hours) at 03/17/17 0926 Last data filed at 03/16/17 1900  Gross per 24 hour  Intake           377.51 ml  Output             2000 ml  Net         -1622.49 ml   Weight change: -1.1 kg (-2 lb 6.8 oz)  Physical Exam: General appearance: alert, cooperative and appears stated age Head: Normocephalic, without obvious abnormality, atraumatic Back: symmetric, no curvature. ROM normal. No CVA tenderness. Resp: clear to auscultation bilaterally Cardio: regular rate and rhythm, S1, S2 normal, no murmur, click, rub or gallop GI: soft, non-tender; bowel sounds normal; no masses, no organomegaly Extremities: extremities normal, atraumatic, no cyanosis or edema Pulses: 2+ and symmetric HD Access: rt BCF thrombosed; very weak bruit no longer present  Imaging: No results  found.  Labs: BMET  Recent Labs Lab 03/13/17 0854 03/13/17 1754 03/13/17 2319 03/14/17 0206 03/14/17 0744 03/14/17 1056 03/15/17 1003  NA 139 136  --  136  --   --  133*  K 5.1 7.1* 3.1* 4.1 4.8 4.5 5.3*  CL 100* 99*  --  98*  --   --  96*  CO2 24 19*  --  27  --   --  24  GLUCOSE 87 119*  --  101*  --   --  94  BUN 53* 60*  --  23*  --   --  45*  CREATININE 12.42* 12.96*  --  6.97*  --   --  10.54*  CALCIUM 8.7* 8.3*  --  8.5*  --   --  8.4*  PHOS  --   --   --   --   --   --  7.3*   CBC  Recent Labs Lab 03/13/17 1540 03/14/17 0206 03/15/17 0154 03/16/17 0243 03/17/17 0249  WBC 10.9* 9.0 8.3 9.8 9.6  NEUTROABS 9.1*  --   --   --   --  HGB 9.8* 9.7* 9.6* 9.0* 8.8*  HCT 31.5* 30.1* 29.9* 28.1* 28.0*  MCV 95.2 92.9 92.6 90.9 92.7  PLT 150 139* 131* 156 169    Medications:    . calcium acetate  667 mg Oral TID WC  . dextrose  1 ampule Intravenous Once  . diphenhydrAMINE  12.5 mg Intravenous Once  . ferric citrate  630 mg Oral BID WC  . hydroxychloroquine  200 mg Oral Daily  . insulin aspart  10 Units Intravenous Once  . levETIRAcetam  500 mg Oral BID  . levETIRAcetam  500 mg Oral Q T,Th,Sa-HD  . metoCLOPramide (REGLAN) injection  5 mg Intravenous Once  . nortriptyline  50 mg Oral QHS  . senna-docusate  1 tablet Oral Daily  . sodium chloride flush  3 mL Intravenous Q12H  . sodium chloride flush  3 mL Intravenous Q12H  . Warfarin - Pharmacist Dosing Inpatient   Does not apply [phone]

## 2017-03-17 NOTE — Progress Notes (Signed)
15:15 RN called to the room for bleeding from HD cath site again, patient has associated chest pain (same as what she experienced prior to coming in) and is tearful. Noted increase in puffiness above the insertion site of the cath, more discoloration: questionable hematoma.   Paged attending and IR PA again. MD came to bedside new orders provided.  Provided PRN pain medication to the patient. This improved her Chest pain.   Dressing Change: Sat patient higher up in bed. Repeated steps from previous dressing change: removed dressing, held pressure for 15 minutes, applied occlusive and pressure dressing again and advised patient to call if she notices more bleeding.

## 2017-03-17 NOTE — Progress Notes (Signed)
ANTICOAGULATION CONSULT NOTE - Follow Up Consult  Pharmacy Consult for Heparin Indication: pulmonary embolus  No Known Allergies  Patient Measurements: Height: 5\' 6"  (167.6 cm) Weight: 169 lb 12.1 oz (77 kg) IBW/kg (Calculated) : 59.3  Vital Signs: Temp: (P) 98.4 F (36.9 C) (10/30 0256) Temp Source: Oral (10/30 0256) BP: (P) 117/78 (10/30 0256) Pulse Rate: (P) 102 (10/30 0256)  Labs:  Recent Labs  03/14/17 0744 03/14/17 1056  03/15/17 0154  03/15/17 1003 03/15/17 1510 03/16/17 0243 03/17/17 0249  HGB  --   --   < > 9.6*  --   --   --  9.0* 8.8*  HCT  --   --   --  29.9*  --   --   --  28.1* 28.0*  PLT  --   --   --  131*  --   --   --  156 169  LABPROT  --   --   --   --   --   --   --  14.8 19.5*  INR  --   --   --   --   --   --   --  1.17 1.67  HEPARINUNFRC  --   --   < > 1.76*  < > 2.06* 0.50 0.46 1.28*  CREATININE  --   --   --   --   --  10.54*  --   --   --   TROPONINI 0.21* 0.16*  --   --   --   --   --   --   --   < > = values in this interval not displayed.  Estimated Creatinine Clearance: 7.5 mL/min (A) (by C-G formula based on SCr of 10.54 mg/dL (H)).   Assessment: 41 yo female with ESRD, on IV heparin for new PE + Coumadin added. Obtaining labs has been difficult due to limited access, order in place to stick feet as needed.  Heparin level high at 1.26 this AM - previously therapeutic on this rate. Will have RN pause drip for 2 minutes and flush x 3, then stat redraw heparin level.  Goal of Therapy:  Heparin level 0.3-0.7 units/ml Monitor platelets by anticoagulation protocol: Yes   Plan:  Continue heparin drip at 1250 units/hr for now Stat heparin level redraw Daily heparin level and CBC. Monitor for s/sx bleeding  Elicia Lamp, PharmD, BCPS Clinical Pharmacist 03/17/2017 3:48 AM

## 2017-03-17 NOTE — Progress Notes (Signed)
ANTICOAGULATION CONSULT NOTE - Follow Up Consult  Pharmacy Consult for Heparin Indication: pulmonary embolus  No Known Allergies  Patient Measurements: Height: 5\' 6"  (167.6 cm) Weight: 169 lb 12.1 oz (77 kg) IBW/kg (Calculated) : 59.3  Vital Signs: Temp: 98.3 F (36.8 C) (10/30 0805) Temp Source: Oral (10/30 0805) BP: 122/84 (10/30 0805) Pulse Rate: 102 (10/30 0256)  Labs:  Recent Labs  03/14/17 1056  03/15/17 0154  03/15/17 1003  03/16/17 0243 03/17/17 0249 03/17/17 0355  HGB  --   < > 9.6*  --   --   --  9.0* 8.8*  --   HCT  --   --  29.9*  --   --   --  28.1* 28.0*  --   PLT  --   --  131*  --   --   --  156 169  --   LABPROT  --   --   --   --   --   --  14.8 19.5*  --   INR  --   --   --   --   --   --  1.17 1.67  --   HEPARINUNFRC  --   < > 1.76*  < > 2.06*  < > 0.46 1.28* 0.48  CREATININE  --   --   --   --  10.54*  --   --   --   --   TROPONINI 0.16*  --   --   --   --   --   --   --   --   < > = values in this interval not displayed.  Estimated Creatinine Clearance: 7.5 mL/min (A) (by C-G formula based on SCr of 10.54 mg/dL (H)).   Assessment: 41 yo female with ESRD, on IV heparin for new PE + Coumadin added (day 3 overlap). Obtaining labs has been difficult due to limited access, order in place to stick feet as needed. -heparin level is at goal on 1250 units/hr -INR 1.67 with trend up   Goal of Therapy:  Heparin level 0.3-0.7 units/ml Monitor platelets by anticoagulation protocol: Yes   Plan:  -No heparin changes needed -Coumadin 5mg  po today -Daily PT/INR  Hildred Laser, Pharm D 03/17/2017 10:14 AM

## 2017-03-17 NOTE — Progress Notes (Addendum)
Called to room by NT for bleeding. Upon entering room RN noticed blood on gown that is over the HD cath site.  When inspected, HD cath dressing is saturated with frank blood and there is continuous dripping from the end of dressing  Site. RN applied dry gauze and held pressure. Gauze was saturated with blood that had collected in the dressing. Replaced with new dry gauze and notified attending, IR, and HD.   Pt is A & O times 3 and asymptomatic at this time. Will continue to monitor.   Notified HD of oozing-currently unable to go to HD for treatment. They asked to be called back once patient was able.  Jannifer Franklin, PA at bedside. Sat pt up. Took dressing down, held pressure, redressed with both occlusive and pressure. Pt is to sit up until HD.   Will continue to monitor.

## 2017-03-17 NOTE — Progress Notes (Addendum)
PROGRESS NOTE    Terika Pillard  UXL:[id number] DOB: Oct 02, 1975 DOA: 03/13/2017 PCP: Estanislado Emms, MD    Brief Narrative:  Maria Vazquez is a 41 y.o. female with medical history significant for end-stage renal disease on hemodialysis, lupus, hypertension, seizure disorder, and chronic anemia, now presenting to the emergency department with chest pain and dialysis access problem.  Patient reports completing dialysis on 03/10/2017, but was unable to on 03/12/2017 due to a clotted fistula.  She presented today for declot, which was unsuccessful, and she had a dialysis catheter placed.  Catheter was placed, she began to complain of chest pain, and was directed to the ED for further evaluation.  She had been hypertensive, given clonidine, but vomited it back and was later given another dose which she tolerated.  Upon arrival to the ED, patient is found to be afebrile, saturating well on 2 L/min of supplemental oxygen, hypertensive to 160/115, and with vitals otherwise stable.  EKG features a sinus rhythm with nonspecific ST and T abnormality.  Chest x-ray is notable for the new left-sided central venous catheter with tip overlying the right atrium, but no edema or infiltrate.  Chemistry panel reveals a potassium of 7.1, bicarbonate 19, BUN 60, and anion gap of 18.  CBC is notable for a slight leukocytosis to 10,900 and a chronic normocytic anemia with hemoglobin of 9.8.  Troponin was elevated to 0.59, improving slightly to 0.45 when repeated 2-1/2 hours later.  Patient was treated with continuous albuterol neb, D50%, 10 units IV NovoLog, and Dilaudid in the ED.  Cardiology has evaluated the patient in the emergency department and given recommendations.  Nephrology was also consulted by the ED physician for urgent dialysis.  Patient remains hypertensive, not in acute respiratory distress, and will be admitted to the stepdown unit for ongoing evaluation and management of hyperkalemia,  chest pain, elevated troponin, and hypertensive urgency.  Patient was found to have bilateral PE in the setting of attempt to declot AV fistula. She was started on IV heparin and coumadin. Hyperkalemia resolved with DH. She has history of lupus, antiphospholipid panel is pending.    Assessment & Plan:   Principal Problem:   Hyperkalemia Active Problems:   Anemia in ESRD (end-stage renal disease) (HCC)   Lupus (systemic lupus erythematosus) (HCC)   Chest pain at rest   Hypertensive urgency   End stage renal disease ([place])   Seizure disorder ([place])   Clotted dialysis access ([place])   ESRD, access problem, hyperkalemia, metabolic acidosis, hypertensive urgency  - Pt reports completing HD on 10/23, but had access problems on 10/25, had attempted declot just prior to admission and catheter was placed in left chest after procedure was unsuccessful  -Underwent emergent HD 10-26.  Hyperkalemia correction during HD.  HD today.   Bilateral Pulmonary embolism; Chest pain, elevated troponin in setting of clotted fistula.  EKG with non-specific ST and T abnormalities  ECHO normal ef, normal right ventricular function, possible mild hypokinesis antero septum myocardium.  CT angio positive for bilateral PE.  Continue with IV heparin.  Continue with  Coumadin, day 3 over lap.  INR 1.6. Awaiting, antiphospholipid syndrome results.  Having chest pain, cring, vitals stable. Will received dilaudid.   HD catheter oozing;  Check Hb, Type and scree, IR to evaluate.   History of lupus,  antiphospholipid syndrome test pending.  Had an episode of chest pain l;ast night. EKG similar than before. No hypotension or hypoxemia.  Pain medications as needed. Started  bowel regimen.  Chest pain is controlled.    Seizure disorder  - Continue Keppra    SLE - Stable  - Continue Plaquenil   Anemia - Hgb is 9.8 on admission, down from recent priors in 11-11.5 range  - Recent drop likely secondary to declot  procedure and hemodilution with missed HD  -hb stable.  -Anemia panel; anemia of chronic diseases.    DVT prophylaxis: heparin  Code Status: full code.  Family Communication: care discussed with patient.  Disposition Plan: transfer to telemetry. Need PCP to help with coumadin   Consultants:   Cardiology  Nephrology    Procedures:   Emergent HD.   ECHO   Antimicrobials:   none   Subjective: Chest pain stable.  Denies dyspnea. She is sleepy.   Objective: Vitals:   03/16/17 1945 03/16/17 2320 03/17/17 0256 03/17/17 0805  BP: 114/79 107/80 117/78 122/84  Pulse: (!) 105 (!) 102 (!) 102   Resp: 20 18 15    Temp: 98.4 F (36.9 C) 98.6 F (37 C) 98.4 F (36.9 C) 98.3 F (36.8 C)  TempSrc: Oral Oral Oral Oral  SpO2: 100% 98% 99%   Weight:      Height:        Intake/Output Summary (Last 24 hours) at 03/17/17 1027 Last data filed at 03/17/17 0800  Gross per 24 hour  Intake           630.01 ml  Output             2000 ml  Net         -1369.99 ml   Filed Weights   03/16/17 0358 03/16/17 1243 03/16/17 1643  Weight: 80.1 kg (176 lb 9.4 oz) 79 kg (174 lb 2.6 oz) 77 kg (169 lb 12.1 oz)    Examination:  General exam: NAD Respiratory system: CTA Cardiovascular system: S 1, S 2 RRR Gastrointestinal system: BS present, soft, nt Central nervous system: non focal.  Extremities: Symmetric power. . Right arm with dressing.  Skin: No rashes, lesions or ulcers    Data Reviewed: I have personally reviewed following labs and imaging studies  CBC:  Recent Labs Lab 03/13/17 1540 03/14/17 0206 03/15/17 0154 03/16/17 0243 03/17/17 0249  WBC 10.9* 9.0 8.3 9.8 9.6  NEUTROABS 9.1*  --   --   --   --   HGB 9.8* 9.7* 9.6* 9.0* 8.8*  HCT 31.5* 30.1* 29.9* 28.1* 28.0*  MCV 95.2 92.9 92.6 90.9 92.7  PLT 150 139* 131* 156 735   Basic Metabolic Panel:  Recent Labs Lab 03/13/17 0854 03/13/17 1754 03/13/17 2319 03/14/17 0206 03/14/17 0744 03/14/17 1056  03/15/17 1003  NA 139 136  --  136  --   --  133*  K 5.1 7.1* 3.1* 4.1 4.8 4.5 5.3*  CL 100* 99*  --  98*  --   --  96*  CO2 24 19*  --  27  --   --  24  GLUCOSE 87 119*  --  101*  --   --  94  BUN 53* 60*  --  23*  --   --  45*  CREATININE 12.42* 12.96*  --  6.97*  --   --  10.54*  CALCIUM 8.7* 8.3*  --  8.5*  --   --  8.4*  PHOS  --   --   --   --   --   --  7.3*   GFR: Estimated Creatinine Clearance: 7.5 mL/min (A) (by C-G formula  based on SCr of 10.54 mg/dL (H)). Liver Function Tests:  Recent Labs Lab 03/15/17 1003  ALBUMIN 3.0*   No results for input(s): LIPASE, AMYLASE in the last 168 hours. No results for input(s): AMMONIA in the last 168 hours. Coagulation Profile:  Recent Labs Lab 03/13/17 0854 03/16/17 0243 03/17/17 0249  INR 1.03 1.17 1.67   Cardiac Enzymes:  Recent Labs Lab 03/14/17 0206 03/14/17 0744 03/14/17 1056  TROPONINI 0.28* 0.21* 0.16*   BNP (last 3 results) No results for input(s): PROBNP in the last 8760 hours. HbA1C: No results for input(s): HGBA1C in the last 72 hours. CBG:  Recent Labs Lab 03/13/17 1941  GLUCAP 125*   Lipid Profile: No results for input(s): CHOL, HDL, LDLCALC, TRIG, CHOLHDL, LDLDIRECT in the last 72 hours. Thyroid Function Tests: No results for input(s): TSH, T4TOTAL, FREET4, T3FREE, THYROIDAB in the last 72 hours. Anemia Panel:  Recent Labs  03/17/17 0249  VITAMINB12 335  FOLATE 11.4  FERRITIN 4,680*  TIBC 133*  IRON 51  RETICCTPCT 2.2   Sepsis Labs: No results for input(s): PROCALCITON, LATICACIDVEN in the last 168 hours.  Recent Results (from the past 240 hour(s))  MRSA PCR Screening     Status: None   Collection Time: 03/14/17 12:32 AM  Result Value Ref Range Status   MRSA by PCR NEGATIVE NEGATIVE Final    Comment:        The GeneXpert MRSA Assay (FDA approved for NASAL specimens only), is one component of a comprehensive MRSA colonization surveillance program. It is not intended to  diagnose MRSA infection nor to guide or monitor treatment for MRSA infections.          Radiology Studies: No results found.      Scheduled Meds: . calcium acetate  667 mg Oral TID WC  . dextrose  1 ampule Intravenous Once  . diphenhydrAMINE  12.5 mg Intravenous Once  . ferric citrate  630 mg Oral BID WC  . hydroxychloroquine  200 mg Oral Daily  . insulin aspart  10 Units Intravenous Once  . levETIRAcetam  500 mg Oral BID  . levETIRAcetam  500 mg Oral Q T,Th,Sa-HD  . metoCLOPramide (REGLAN) injection  5 mg Intravenous Once  . nortriptyline  50 mg Oral QHS  . senna-docusate  1 tablet Oral Daily  . sodium chloride flush  3 mL Intravenous Q12H  . sodium chloride flush  3 mL Intravenous Q12H  . warfarin  5 mg Oral ONCE-1800  . Warfarin - Pharmacist Dosing Inpatient   Does not apply q1800   Continuous Infusions: . sodium chloride    . heparin 1,250 Units/hr (03/17/17 1000)     LOS: 4 days    Time spent: 35 minutes.     Elmarie Shiley, MD [facility] Pager [phone]  If 7PM-7AM, please contact night-coverage [email or web address] Password [email or web address] 03/17/2017, 10:27 AM

## 2017-03-18 ENCOUNTER — Inpatient Hospital Stay (HOSPITAL_COMMUNITY): Payer: Medicaid Other

## 2017-03-18 DIAGNOSIS — N186 End stage renal disease: Secondary | ICD-10-CM

## 2017-03-18 DIAGNOSIS — T8249XD Other complication of vascular dialysis catheter, subsequent encounter: Secondary | ICD-10-CM

## 2017-03-18 DIAGNOSIS — T82868A Thrombosis of vascular prosthetic devices, implants and grafts, initial encounter: Secondary | ICD-10-CM

## 2017-03-18 DIAGNOSIS — G40909 Epilepsy, unspecified, not intractable, without status epilepticus: Secondary | ICD-10-CM

## 2017-03-18 DIAGNOSIS — Z992 Dependence on renal dialysis: Secondary | ICD-10-CM

## 2017-03-18 DIAGNOSIS — I10 Essential (primary) hypertension: Secondary | ICD-10-CM

## 2017-03-18 DIAGNOSIS — D631 Anemia in chronic kidney disease: Secondary | ICD-10-CM

## 2017-03-18 LAB — CBC
HEMATOCRIT: 29.5 % — AB (ref 36.0–46.0)
HEMOGLOBIN: 9.2 g/dL — AB (ref 12.0–15.0)
MCH: 29.3 pg (ref 26.0–34.0)
MCHC: 31.2 g/dL (ref 30.0–36.0)
MCV: 93.9 fL (ref 78.0–100.0)
Platelets: 196 10*3/uL (ref 150–400)
RBC: 3.14 MIL/uL — ABNORMAL LOW (ref 3.87–5.11)
RDW: 16.2 % — AB (ref 11.5–15.5)
WBC: 8.5 10*3/uL (ref 4.0–10.5)

## 2017-03-18 LAB — ANTIPHOSPHOLIPID SYNDROME EVAL, BLD
ANTICARDIOLIPIN IGA: 20 U/mL — AB (ref 0–11)
Anticardiolipin IgM: <9 MPL U/mL (ref 0–12)
DRVVT: 73.9 s — ABNORMAL HIGH (ref 0.0–47.0)
PHOSPHATYDALSERINE, IGA: 2 {APS'U} (ref 0–20)
PHOSPHATYDALSERINE, IGM: 7 {MPS'U} (ref 0–25)
PTT LA: 68.7 s — AB (ref 0.0–51.9)
Phosphatydalserine, IgG: 4 GPS IgG (ref 0–11)

## 2017-03-18 LAB — HEPARIN LEVEL (UNFRACTIONATED): HEPARIN UNFRACTIONATED: 0.7 [IU]/mL (ref 0.30–0.70)

## 2017-03-18 LAB — HEXAGONAL PHASE PHOSPHOLIPID: HEXAGONAL PHASE PHOSPHOLIPID: 14 s — AB (ref 0–11)

## 2017-03-18 LAB — PROTIME-INR
INR: 2.89
Prothrombin Time: 30 seconds — ABNORMAL HIGH (ref 11.4–15.2)

## 2017-03-18 LAB — PTT-LA MIX: PTT-LA Mix: 58.8 s — ABNORMAL HIGH (ref 0.0–48.9)

## 2017-03-18 LAB — DRVVT MIX: DRVVT MIX: 49.6 s — AB (ref 0.0–47.0)

## 2017-03-18 LAB — TROPONIN I: Troponin I: 0.24 ng/mL (ref <0.03)

## 2017-03-18 LAB — DRVVT CONFIRM: dRVVT Confirm: 1.4 ratio — ABNORMAL HIGH (ref 0.8–1.2)

## 2017-03-18 MED ORDER — WARFARIN SODIUM 2 MG PO TABS
1.0000 mg | ORAL_TABLET | Freq: Once | ORAL | Status: AC
  Administered 2017-03-18: 1 mg via ORAL
  Filled 2017-03-18: qty 0.5

## 2017-03-18 NOTE — Progress Notes (Signed)
Vein mapping  has been completed. Preliminary results can be found: Chart review -> CV Proc  Landry Mellow, RDMS, RVT

## 2017-03-18 NOTE — Progress Notes (Signed)
Pt c/o CP @ 9735 and wanted to stop HD tx, tx rinsed back, RR called, MD paged, after RR arrived and did EKG and admin 1 nitro SL tab, the nitro didn't help so she devised it wasn't true CP, got pt some IV pain meds and encouraged pt to let me restart tx, tx was restarted @ 2330, will cont to monitor while on HD tx

## 2017-03-18 NOTE — Progress Notes (Signed)
HD tx completed @ 0105 w/ no more issues after pt received pain med, there were no more c/o CP and after reversing lines too and lowering BFR to 375 there were no more alarming issues, UF goal met, blood rinsed back, report called to Ian Bushman, RN

## 2017-03-18 NOTE — Progress Notes (Signed)
[facility] RN called for chest pain. On arrival pt lying supine in bed, alert and oriented x3, skin warm and dry. Pt describes her pain as 7/10 "stabbing pain". EKG with no changes, nitro x1 SL PTA. BP 113/81, HR 108, RR 12, 99% RA.  Pt denies any relief with nitro. With further assessment the pain was at her dialysis site which shew has placed in IR earlier today. Similar episode approx 1500. PRN Dilaudid 1mg  IVP given. Pt requested to dc dialysis, after pain resolving pt agreed to continue dialysis.  [facility] RN reports dialysis machine was non stop beeping with no kinks in line, she was concerned maybe tip was kinked. Portable CXR obtained.  Kendell primary RN to bedside.

## 2017-03-18 NOTE — Progress Notes (Signed)
[facility] Vaz[facility] Progress Note   Dialysis: TTS [facility] 3.5h   2/2.25  75.5kg   Hep 7800 -mircera 75 ug q 2wks -calc 0.25 ug tiw    Assessment:   1. Clotted R AVF - sp unsuccessful attempt to declot 10/26 by IR on day of admission, new L IJ TDC was placed. Procedure c/b bilat PE, see below.  Will need new perm access at some point, prob not now given [name] Done on coumadin; will ask [facility] to see. 2. Acute bilat pulm emboli - complication of attempted declot procedure. Stable, no further CP or SOB.  On IV heparin and coumadin.  Not sure duration of Rx in this setting, have d/w with IR who said the clot burden was "small".  3. Bleeding TDC site - has had 2 episodes, have d/w IR, suspect due to anticoag. Observe. Not bleeding now.  4. Chest pain - mostly around the catheter. Could have developed a hematoma from the exit site bleeding. Cracked catheter causing bleeding and pain while in use is another possibility. Will see how she does w/ HD tomorrow.   5. ESRD TTS [place]. HD tomorrow  2. HTN/ vol  - BP's good. No BP meds at home. Up 2kg by wts. Stable vol on exam.  3. SLE - stable. 4. Anemia - Mircera 36mcg q2weeks. 5. HPTH - Calcitriol 0.47mcg TIW    Plan - HD tomorrow am, would like to see good use of cath w/o bleeding / pain prior to dc home. As above.    Kelly Splinter MD [employer] pgr [phone]   03/18/2017, 10:15 AM     Subjective:   Had pain around the HD cath site and bleeding yest from cath.     Objective:   BP 120/85 (BP Location: Left Arm)   Pulse (!) 102   Temp 98.2 F (36.8 C) (Oral)   Resp 15   Ht 5\' 6"  (1.676 m)   Wt 77.2 kg (170 lb 3.1 oz)   LMP 02/11/2017   SpO2 96%   BMI 27.47 kg/m   Intake/Output Summary (Last 24 hours) at 03/18/17 1015 Last data filed at 03/18/17 0105  Gross per 24 hour  Intake              630 ml  Output             1615 ml  Net             -985 ml   Weight change: -1.9 kg (-4 lb 3 oz)  Physical  Exam: General appearance: alert, cooperative and appears stated age Head: Normocephalic, without obvious abnormality, atraumatic Back: symmetric, no curvature. ROM normal. No CVA tenderness. Resp: clear to auscultation bilaterally Cardio: regular rate and rhythm, S1, S2 normal, no murmur, click, rub or gallop GI: soft, non-tender; bowel sounds normal; no masses, no organomegaly Extremities: extremities normal, atraumatic, no cyanosis or edema Pulses: 2+ and symmetric HD Access: rt BCF thrombosed; very weak bruit no longer present  Imaging: Dg Chest Port 1 View  Result Date: 03/17/2017 CLINICAL DATA:  Chest pain tonight. EXAM: PORTABLE CHEST 1 VIEW COMPARISON:  CT 3 days prior 03/14/2017, radiograph 03/13/2017 FINDINGS: Tip of the left internal jugular venous catheter at atrial caval junction. Normal heart size and mediastinal contours. No pulmonary edema, focal consolidation, pneumothorax or pleural fluid. IMPRESSION: 1.  No acute pulmonary process. 2. Tip of the left dialysis catheter at the atrial caval junction. Electronically Signed   By: Threasa Beards  Ehinger M.D.   On: 03/17/2017 23:54    Labs: BMET  Recent Labs Lab 03/13/17 0854 03/13/17 1754 03/13/17 2319 03/14/17 0206 03/14/17 0744 03/14/17 1056 03/15/17 1003 03/17/17 1540  NA 139 136  --  136  --   --  133* 132*  K 5.1 7.1* 3.1* 4.1 4.8 4.5 5.3* 4.9  CL 100* 99*  --  98*  --   --  96* 95*  CO2 24 19*  --  27  --   --  24 25  GLUCOSE 87 119*  --  101*  --   --  94 96  BUN 53* 60*  --  23*  --   --  45* 24*  CREATININE 12.42* 12.96*  --  6.97*  --   --  10.54* 7.59*  CALCIUM 8.7* 8.3*  --  8.5*  --   --  8.4* 8.8*  PHOS  --   --   --   --   --   --  7.3*  --    CBC  Recent Labs Lab 03/13/17 1540  03/15/17 0154 03/16/17 0243 03/17/17 0249 03/17/17 1540 03/18/17 0349  WBC 10.9*  < > 8.3 9.8 9.6  --  8.5  NEUTROABS 9.1*  --   --   --   --   --   --   HGB 9.8*  < > 9.6* 9.0* 8.8* 9.5* 9.2*  HCT 31.5*  < > 29.9*  28.1* 28.0* 28.9* 29.5*  MCV 95.2  < > 92.6 90.9 92.7  --  93.9  PLT 150  < > 131* 156 169  --  196  < > = values in this interval not displayed.  Medications:    . calcium acetate  667 mg Oral TID WC  . diphenhydrAMINE  12.5 mg Intravenous Once  . ferric citrate  630 mg Oral BID WC  . hydroxychloroquine  200 mg Oral Daily  . levETIRAcetam  500 mg Oral BID  . levETIRAcetam  500 mg Oral Q T,Th,Sa-HD  . metoCLOPramide (REGLAN) injection  5 mg Intravenous Once  . nortriptyline  50 mg Oral QHS  . senna-docusate  1 tablet Oral Daily  . sodium chloride flush  3 mL Intravenous Q12H  . sodium chloride flush  3 mL Intravenous Q12H  . warfarin  1 mg Oral ONCE-1800  . Warfarin - Pharmacist Dosing Inpatient   Does not apply [phone]

## 2017-03-18 NOTE — Discharge Instructions (Signed)

## 2017-03-18 NOTE — Progress Notes (Signed)
ANTICOAGULATION CONSULT NOTE - Follow Up Consult  Pharmacy Consult for Heparin > warfarin Indication: pulmonary embolus  No Known Allergies  Patient Measurements: Height: 5\' 6"  (167.6 cm) Weight: 170 lb 3.1 oz (77.2 kg) IBW/kg (Calculated) : 59.3  Vital Signs: Temp: 98.2 F (36.8 C) (10/31 0712) Temp Source: Oral (10/31 0712) BP: 120/85 (10/31 0712) Pulse Rate: 102 (10/31 0712)  Labs:  Recent Labs  03/15/17 1003  03/16/17 0243 03/17/17 0249 03/17/17 0355 03/17/17 1540 03/17/17 2054 03/18/17 0349  HGB  --   < > 9.0* 8.8*  --  9.5*  --  9.2*  HCT  --   < > 28.1* 28.0*  --  28.9*  --  29.5*  PLT  --   --  156 169  --   --   --  196  LABPROT  --   --  14.8 19.5*  --   --   --  30.0*  INR  --   --  1.17 1.67  --   --   --  2.89  HEPARINUNFRC 2.06*  < > 0.46 1.28* 0.48  --   --  0.70  CREATININE 10.54*  --   --   --   --  7.59*  --   --   TROPONINI  --   --   --   --   --   --  0.13* 0.24*  < > = values in this interval not displayed.  Estimated Creatinine Clearance: 10.4 mL/min (A) (by C-G formula based on SCr of 7.59 mg/dL (H)).   Assessment: 41 yo female with ESRD, on IV heparin for new PE + Coumadin added (day 3 overlap). Obtaining labs has been difficult due to limited access, order in place to stick feet as needed. -heparin level is at top of  Goal 0.7 on 1250 units/hr - will decrease slightly -INR 1.67 > 2.89 big jump after 3 doses - will back down dose today Sill needs overlap with PE   Goal of Therapy:  Heparin level 0.3-0.7 units/ml Monitor platelets by anticoagulation protocol: Yes   Plan:   Decrease Heparin drip 1100 uts/hr Coumdain 1mg  x1 today Daily CBC, HL, INR  Bonnita Nasuti Pharm.D. CPP, BCPS Clinical Pharmacist [phone] 03/18/2017 10:07 AM

## 2017-03-18 NOTE — Consult Note (Signed)
Hospital Consult    Reason for Consult:  In need of permanent HD access Requesting Physician:  Jonnie Finner MRN #:  [id number]  History of Present Illness: [name] who came in the hospital on 03/13/17 after undergoing unsuccessful declot of the right BC AVF secondary to the large amount of occlusive thrombus in the aneurysmal segment of the outflow cephalic vein and technically successful balloon angioplasty of the central outflow stenosis.  A tunneled dialysis catheter was placed by IR .  VVS is consulted for new access.  She developed chest pain and was sent to the ER for further evaluation.  She was admitted and underwent emergent HD.    After workup, she underwent CT and this was consistent with PE and heparin was started per pharmacy.  She is now on coumadin with an INR of 2.89.   Pt is right hand dominant.   Previous access procedures: -Hx right radial cephalic AVF date unknown -[facility] placement 03/08/11 -ligation of right RC AVF & creation of right BC AVF 07/18/11 -thrombectomy of right BC AVF 10/10/11 -fistulogram right BC AVF and venoplasty of right axillary vein stenosis 12/08/11 -ligation of 3 competing branches of right BC AVF 01/23/12 -fistulogram right BC AVF and venoplasty of subclavian vein stenosis with 7 mm x 4 cm balloon and 9 mm x 4 cm balloon 04/12/12 -TDC placement (IR) 03/13/17  She does have a seizure disorder, hx lupus, hypertension, dialysis T/T/S and hx of CHF.  Past Medical History:  Diagnosis Date  . Anemia   . Blood transfusion   . Cervix carcinoma in situ Mar 2011  . CHF (congestive heart failure) ([place])   . Chronic kidney disease    T, Th, Sat  . Dialysis patient [facility])    tues, thursday, saturday.  . Gallstone pancreatitis Feb 2011  . Hypertension   . Lupus   . Lupus nephritis ([place])   . Pneumonia due to organism 03/02/2011  . Proteinuria - cause not known   . Renal failure, acute on chronic (HCC)   . Renal insufficiency   . Seizure  disorder ([place]) 03/19/2013  . Thrombocytopenia ([place])     Past Surgical History:  Procedure Laterality Date  . APPENDECTOMY    . AV FISTULA PLACEMENT    . AV FISTULA PLACEMENT  07/18/2011   Procedure: ARTERIOVENOUS (AV) FISTULA CREATION;  Surgeon: Angelia Mould, MD;  Location: [place];  Service: Vascular;  Laterality: Right;  . AV FISTULA PLACEMENT  01/23/12   Revision of Right AVF  . CERVICAL CONE BIOPSY    . CESAREAN SECTION    . CHOLECYSTECTOMY    . EMBOLECTOMY  10/10/2011   Procedure: EMBOLECTOMY;  Surgeon: Angelia Mould, MD;  Location: [place];  Service: Vascular;  Laterality: Right;  . FISTULOGRAM Right 04/12/2012   Procedure: FISTULOGRAM;  Surgeon: Angelia Mould, MD;  Location: [facility] CATH LAB;  Service: Cardiovascular;  Laterality: Right;  . IR FLUORO GUIDE CV LINE LEFT  03/13/2017  . IR THROMBECTOMY AV FISTULA W/THROMBOLYSIS/PTA INC/SHUNT/IMG RIGHT Right 03/13/2017  . IR US GUIDE VASC ACCESS LEFT  03/13/2017  . IR US GUIDE VASC ACCESS RIGHT  03/13/2017  . SHUNTOGRAM Right 07/07/2011   Procedure: SHUNTOGRAM;  Surgeon: Angelia Mould, MD;  Location: [facility] CATH LAB;  Service: Cardiovascular;  Laterality: Right;  . SHUNTOGRAM N/A 12/08/2011   Procedure: Earney Mallet;  Surgeon: Angelia Mould, MD;  Location: [facility] CATH LAB;  Service: Cardiovascular;  Laterality: N/A;    No  Known Allergies  Prior to Admission medications   Medication Sig Start Date End Date Taking? Authorizing Provider  acetaminophen (TYLENOL) 500 MG tablet Take 500 mg by mouth every 6 (six) hours as needed for mild pain or moderate pain.   Yes [provider]  calcium acetate (PHOSLO) 667 MG capsule Take 667 mg by mouth 3 (three) times daily with meals.    Yes [provider]  ferric citrate (AURYXIA) 1 GM 210 MG(Fe) tablet Take 630 mg by mouth 2 (two) times daily.   Yes [provider]  hydroxychloroquine (PLAQUENIL) 200 MG tablet Take 1 tablet (200 mg total) by mouth  daily. 01/13/15  Yes Kathie Dike, MD  levETIRAcetam (KEPPRA) 500 MG tablet Take 500 mg by mouth See admin instructions. Pt to take 1 tablet twice daily - and to take an additional tablet after dialysis on Tues Thurs and Sat   Yes [provider]  nortriptyline (PAMELOR) 50 MG capsule Take 50 mg by mouth daily.   Yes [provider]    Social History   Social History  . Marital status: Married    Spouse name: N/A  . Number of children: N/A  . Years of education: N/A   Occupational History  . Not on file.   Social History Main Topics  . Smoking status: Never Smoker  . Smokeless tobacco: Never Used  . Alcohol use No  . Drug use: No  . Sexual activity: Yes    Birth control/ protection: Condom, None   Other Topics Concern  . Not on file   Social History Narrative  . No narrative on file     Family History  Problem Relation Age of Onset  . Diabetes Mother   . Diabetes Brother   . Diabetes Brother   . Diabetes Maternal Grandmother   . Anesthesia problems Neg Hx     ROS: [x]  Positive   [ ]  Negative   [ ]  All sytems reviewed and are negative  Cardiac: [x]  chest pain/pressure []  palpitations []  SOB lying flat []  DOE  Vascular: []  pain in legs while walking []  pain in legs at rest []  pain in legs at night []  non-healing ulcers []  hx of DVT []  swelling in legs  Pulmonary: []  productive cough []  asthma/wheezing []  home O2 [x]  PE  Neurologic: []  weakness in []  arms []  legs []  numbness in []  arms []  legs []  hx of CVA []  mini stroke [] difficulty speaking or slurred speech []  temporary loss of vision in one eye []  dizziness [x]  seizure disorder  Hematologic: [x]  hx of cancer-cervical carcinoma in situ []  bleeding problems []  problems with blood clotting easily  Endocrine:   []  diabetes []  thyroid disease  GI []  vomiting blood []  blood in stool  GU: [x]  CKD/renal failure [x]  HD--[]  M/W/F or [x]  T/T/S []  burning with  urination []  blood in urine  Psychiatric: []  anxiety []  depression  Musculoskeletal: []  arthritis []  joint pain  Integumentary: []  rashes []  ulcers  Constitutional: []  fever []  chills   Physical Examination  Vitals:   03/18/17 0356 03/18/17 0712  BP: 120/87 120/85  Pulse: (!) 103 (!) 102  Resp: 16 15  Temp: 98.4 F (36.9 C) 98.2 F (36.8 C)  SpO2: 96%    Body mass index is 27.47 kg/m.  General:  WDWN in NAD Gait: Not observed HENT: WNL, normocephalic Pulmonary: normal non-labored breathing, without Rales, rhonchi,  wheezing Cardiac: regular, without  Murmurs, rubs or gallops; without carotid bruits Abdomen:  soft, NT/ND, no masses Skin: without rashes Vascular Exam/Pulses:  Right Left  Radial 2+ (normal) 2+ (normal)  DP 3+ (hyperdynamic) 3+ (hyperdynamic)   Extremities: without ischemic changes, without Gangrene , without cellulitis; without open wounds;  Left IJ TDC in place. Musculoskeletal: no muscle wasting or atrophy  Neurologic: A&O X 3;  No focal weakness or paresthesias are detected; speech is fluent/normal Psychiatric:  The pt has Normal affect.   CBC    Component Value Date/Time   WBC 8.5 03/18/2017 0349   RBC 3.14 (L) 03/18/2017 0349   HGB 9.2 (L) 03/18/2017 0349   HCT 29.5 (L) 03/18/2017 0349   PLT 196 03/18/2017 0349   MCV 93.9 03/18/2017 0349   MCH 29.3 03/18/2017 0349   MCHC 31.2 03/18/2017 0349   RDW 16.2 (H) 03/18/2017 0349   LYMPHSABS 1.0 03/13/2017 1540   MONOABS 0.7 03/13/2017 1540   EOSABS 0.1 03/13/2017 1540   BASOSABS 0.0 03/13/2017 1540    BMET    Component Value Date/Time   NA 132 (L) 03/17/2017 1540   K 4.9 03/17/2017 1540   CL 95 (L) 03/17/2017 1540   CO2 25 03/17/2017 1540   GLUCOSE 96 03/17/2017 1540   BUN 24 (H) 03/17/2017 1540   CREATININE 7.59 (H) 03/17/2017 1540   CALCIUM 8.8 (L) 03/17/2017 1540   CALCIUM 7.9 (L) 03/06/2011 1200   GFRNONAA 6 (L) 03/17/2017 1540   GFRAA 7 (L) 03/17/2017 1540     COAGS: Lab Results  Component Value Date   INR 2.89 03/18/2017   INR 1.67 03/17/2017   INR 1.17 03/16/2017     Non-Invasive Vascular Imaging:   BUE vein mapping ordered today   Statin:  No. Beta Blocker:  No. Aspirin:  No. ACEI:  No. ARB:  No. CCB use:  No Other antiplatelets/anticoagulants:  Yes.   heparin/coumadin   ASSESSMENT/PLAN: [name] with clotted right BC AVF and recently placed left IJ TDC by IR now in need of new permanent HD access.  Pt admitted with PE after recent attempted thrombectomy.  She is right hand dominant.   -I have asked the nurse to change the IV from the left arm to the right arm -vein mapping has been ordered -she has hx of venoplasty of the right axillary and subclavian veins in the past -will most likely need new access in the left arm -she will need to be off her coumadin.  Anticipate this can be done early next week.  -Dr. Oneida Alar will be in to see pt this afternoon. -interpreter via pad was used.     Leontine Locket, PA-C [facility] [phone]   History and exam details as above.  Pt needs new access.  Left vein map reviewed. Has good cephalic vein at the wrist and upper arm with normal arterial pulses.  Will schedule for left radial cephalic AVF in a few weeks after pt recovers from this event.  Risk benefit complications procedure details discussed.  We will let her know when to stop her anticoagulation  Ruta Hinds, MD [facility] [place]: [phone] Pager: [phone]

## 2017-03-18 NOTE — Progress Notes (Signed)
PROGRESS NOTE    Maria Vazquez  MWU:[id number] DOB: 03/15/1976 DOA: 03/13/2017 PCP: Estanislado Emms, MD   Chief Complaint  Patient presents with  . Chest Pain    Brief Narrative:  HPI on 03/13/2017 by Dr. Christia Reading Opyd [name] is a 41 y.o. female with medical history significant for end-stage renal disease on hemodialysis, lupus, hypertension, seizure disorder, and chronic anemia, now presenting to the emergency department with chest pain and dialysis access problem.  Patient reports completing dialysis on 03/10/2017, but was unable to on 03/12/2017 due to a clotted fistula.  She presented today for declot, which was unsuccessful, and she had a dialysis catheter placed.  Catheter was placed, she began to complain of chest pain, and was directed to the ED for further evaluation.  She had been hypertensive, given clonidine, but vomited it back and was later given another dose which she tolerated.  Interim history Found to have bilateral PEs in the setting of attempted to declot AV fistula. Patient was started on IV heparin and Coumadin. Patient with history of lupus and antiphospholipid panel is pending.  Assessment & Plan   ESRD access problem -Patient's had problems with access on 03/12/2017, attempted to declot prior to admission however procedure unsuccessful. Patient did have catheter placed in left chest.  Bilateral pulmonary emboli -EKG with nonspecific ST-T wave abnormalities -Echocardiogram EF 50-55%, mild hypokinesis of the anteroseptal myocardium  -CTA chest showed bilateral PE -Placed on IV heparin with Coumadin -Patient with history of lupus, pending antiphospholipid syndrome results -INR 2.89  Chest pain/elevated troponin -Likely secondary to catheter access placement -Patient was given nitroglycerin without any resolution of her symptoms -Cardiology consulted and appreciated, but not be an acute coronary syndrome. Did not recommend further  cardiac workup. PE may explain some of the chest pain and elevated troponin.  ESRD with hyperkalemia and metabolic acidosis -Patient dialyzes Tuesday, Thursday, Saturday -Nephrology consulted and appreciated -Continue to monitor renal function -Hyperkalemia correcting with dialysis  HD catheter using  -Continue to monitor hemoglobin and hematocrit   History of lupus -As above, antiphospholipid syndrome panel pending -Continue Plaquenil  Chronic anemia -Continue to monitor CBC -Anemia panel  Seizure disorder -Continue Keppra  DVT Prophylaxis  heparin/Coumadin  Code Status: Full  Family Communication: Friends at bedside  Disposition Plan: Admitted. Pending HD 03/19/2017. Possible discharge thereafter pending access and bleeding issues.   Consultants Nephrology  Cardiology   Procedures  Echoardiogram  Antibiotics   Anti-infectives    Start     Dose/Rate Route Frequency Ordered Stop   03/14/17 1000  hydroxychloroquine (PLAQUENIL) tablet 200 mg     200 mg Oral Daily 03/13/17 1918        Subjective:   Maria Vazquez seen and examined today.  Patient feeling better today. Denies any further chest pain or shortness of breath. Denies any abdominal pain, nausea vomiting, diarrhea or constipation, headache or dizziness.  Objective:   Vitals:   03/18/17 0116 03/18/17 0145 03/18/17 0356 03/18/17 0712  BP: 102/72 104/73 120/87 120/85  Pulse: (!) 114 (!) 111 (!) 103 (!) 102  Resp: 17 (!) 21 16 15   Temp: 98.1 F (36.7 C) 98.1 F (36.7 C) 98.4 F (36.9 C) 98.2 F (36.8 C)  TempSrc: Oral Oral Oral Oral  SpO2: 99% 100% 96%   Weight: 75.5 kg (166 lb 7.2 oz)  77.2 kg (170 lb 3.1 oz)   Height:        Intake/Output Summary (Last 24 hours) at 03/18/17 1146 Last data  filed at 03/18/17 0105  Gross per 24 hour  Intake              630 ml  Output             1615 ml  Net             -985 ml   Filed Weights   03/17/17 2220 03/18/17 0116 03/18/17 0356  Weight:  77.1 kg (169 lb 15.6 oz) 75.5 kg (166 lb 7.2 oz) 77.2 kg (170 lb 3.1 oz)    Exam  General: Well developed, well nourished, NAD, appears stated age  23: NCAT, mucous membranes moist.   Cardiovascular: S1 S2 auscultated, RRR, no murmurs  Respiratory: Clear to auscultation bilaterally with equal chest rise  Abdomen: Soft, nontender, nondistended, + bowel sounds  Extremities: warm dry without cyanosis clubbing or edema  Neuro: AAOx3, nonfocal  Psych: Normal affect and demeanor with intact judgement and insight, pleasant   Data Reviewed: I have personally reviewed following labs and imaging studies  CBC:  Recent Labs Lab 03/13/17 1540 03/14/17 0206 03/15/17 0154 03/16/17 0243 03/17/17 0249 03/17/17 1540 03/18/17 0349  WBC 10.9* 9.0 8.3 9.8 9.6  --  8.5  NEUTROABS 9.1*  --   --   --   --   --   --   HGB 9.8* 9.7* 9.6* 9.0* 8.8* 9.5* 9.2*  HCT 31.5* 30.1* 29.9* 28.1* 28.0* 28.9* 29.5*  MCV 95.2 92.9 92.6 90.9 92.7  --  93.9  PLT 150 139* 131* 156 169  --  664   Basic Metabolic Panel:  Recent Labs Lab 03/13/17 0854 03/13/17 1754  03/14/17 0206 03/14/17 0744 03/14/17 1056 03/15/17 1003 03/17/17 1540  NA 139 136  --  136  --   --  133* 132*  K 5.1 7.1*  < > 4.1 4.8 4.5 5.3* 4.9  CL 100* 99*  --  98*  --   --  96* 95*  CO2 24 19*  --  27  --   --  24 25  GLUCOSE 87 119*  --  101*  --   --  94 96  BUN 53* 60*  --  23*  --   --  45* 24*  CREATININE 12.42* 12.96*  --  6.97*  --   --  10.54* 7.59*  CALCIUM 8.7* 8.3*  --  8.5*  --   --  8.4* 8.8*  PHOS  --   --   --   --   --   --  7.3*  --   < > = values in this interval not displayed. GFR: Estimated Creatinine Clearance: 10.4 mL/min (A) (by C-G formula based on SCr of 7.59 mg/dL (H)). Liver Function Tests:  Recent Labs Lab 03/15/17 1003  ALBUMIN 3.0*   No results for input(s): LIPASE, AMYLASE in the last 168 hours. No results for input(s): AMMONIA in the last 168 hours. Coagulation Profile:  Recent  Labs Lab 03/13/17 0854 03/16/17 0243 03/17/17 0249 03/18/17 0349  INR 1.03 1.17 1.67 2.89   Cardiac Enzymes:  Recent Labs Lab 03/14/17 0206 03/14/17 0744 03/14/17 1056 03/17/17 2054 03/18/17 0349  TROPONINI 0.28* 0.21* 0.16* 0.13* 0.24*   BNP (last 3 results) No results for input(s): PROBNP in the last 8760 hours. HbA1C: No results for input(s): HGBA1C in the last 72 hours. CBG:  Recent Labs Lab 03/13/17 1941  GLUCAP 125*   Lipid Profile: No results for input(s): CHOL, HDL, LDLCALC, TRIG, CHOLHDL, LDLDIRECT in  the last 72 hours. Thyroid Function Tests: No results for input(s): TSH, T4TOTAL, FREET4, T3FREE, THYROIDAB in the last 72 hours. Anemia Panel:  Recent Labs  03/17/17 0249  VITAMINB12 335  FOLATE 11.4  FERRITIN 4,680*  TIBC 133*  IRON 51  RETICCTPCT 2.2   Urine analysis:    Component Value Date/Time   COLORURINE YELLOW 03/02/2011 0948   APPEARANCEUR CLOUDY (A) 03/02/2011 0948   LABSPEC >1.030 (H) 03/02/2011 0948   PHURINE 6.5 03/02/2011 0948   GLUCOSEU NEGATIVE 03/02/2011 0948   HGBUR LARGE (A) 03/02/2011 0948   BILIRUBINUR NEGATIVE 03/02/2011 0948   KETONESUR NEGATIVE 03/02/2011 0948   PROTEINUR >300 (A) 03/02/2011 0948   UROBILINOGEN 0.2 03/02/2011 0948   NITRITE NEGATIVE 03/02/2011 0948   LEUKOCYTESUR NEGATIVE 03/02/2011 0948   Sepsis Labs: @LABRCNTIP (procalcitonin:4,lacticidven:4)  ) Recent Results (from the past 240 hour(s))  MRSA PCR Screening     Status: None   Collection Time: 03/14/17 12:32 AM  Result Value Ref Range Status   MRSA by PCR NEGATIVE NEGATIVE Final    Comment:        The GeneXpert MRSA Assay (FDA approved for NASAL specimens only), is one component of a comprehensive MRSA colonization surveillance program. It is not intended to diagnose MRSA infection nor to guide or monitor treatment for MRSA infections.       Radiology Studies: Dg Chest Port 1 View  Result Date: 03/17/2017 CLINICAL DATA:  Chest pain  tonight. EXAM: PORTABLE CHEST 1 VIEW COMPARISON:  CT 3 days prior 03/14/2017, radiograph 03/13/2017 FINDINGS: Tip of the left internal jugular venous catheter at atrial caval junction. Normal heart size and mediastinal contours. No pulmonary edema, focal consolidation, pneumothorax or pleural fluid. IMPRESSION: 1.  No acute pulmonary process. 2. Tip of the left dialysis catheter at the atrial caval junction. Electronically Signed   By: Jeb Levering M.D.   On: 03/17/2017 23:54     Scheduled Meds: . calcium acetate  667 mg Oral TID WC  . diphenhydrAMINE  12.5 mg Intravenous Once  . ferric citrate  630 mg Oral BID WC  . hydroxychloroquine  200 mg Oral Daily  . levETIRAcetam  500 mg Oral BID  . levETIRAcetam  500 mg Oral Q T,Th,Sa-HD  . metoCLOPramide (REGLAN) injection  5 mg Intravenous Once  . nortriptyline  50 mg Oral QHS  . senna-docusate  1 tablet Oral Daily  . sodium chloride flush  3 mL Intravenous Q12H  . sodium chloride flush  3 mL Intravenous Q12H  . warfarin  1 mg Oral ONCE-1800  . Warfarin - Pharmacist Dosing Inpatient   Does not apply q1800   Continuous Infusions: . sodium chloride    . heparin 1,100 Units/hr (03/18/17 1136)     LOS: 5 days   Time Spent in minutes   30 minutes  Jakie Debow D.O. on 03/18/2017 at 11:46 AM  Between 7am to 7pm - Pager - [phone]  After 7pm go to [email or web address] - password [email or web address]  And look for the night coverage person covering for me after hours  [facility] Office  [phone]

## 2017-03-18 NOTE — Progress Notes (Signed)
HD tx initiated via HD cath w/o problem, pull/push/flush equally w/o problem, VSS, will cont to monitor while on HD tx 

## 2017-03-19 LAB — CBC
HCT: 28 % — ABNORMAL LOW (ref 36.0–46.0)
HEMOGLOBIN: 8.7 g/dL — AB (ref 12.0–15.0)
MCH: 29.1 pg (ref 26.0–34.0)
MCHC: 31.1 g/dL (ref 30.0–36.0)
MCV: 93.6 fL (ref 78.0–100.0)
PLATELETS: 214 10*3/uL (ref 150–400)
RBC: 2.99 MIL/uL — AB (ref 3.87–5.11)
RDW: 16 % — AB (ref 11.5–15.5)
WBC: 7 10*3/uL (ref 4.0–10.5)

## 2017-03-19 LAB — RENAL FUNCTION PANEL
Albumin: 2.8 g/dL — ABNORMAL LOW (ref 3.5–5.0)
Anion gap: 10 (ref 5–15)
BUN: 36 mg/dL — AB (ref 6–20)
CHLORIDE: 97 mmol/L — AB (ref 101–111)
CO2: 27 mmol/L (ref 22–32)
Calcium: 8.9 mg/dL (ref 8.9–10.3)
Creatinine, Ser: 8.84 mg/dL — ABNORMAL HIGH (ref 0.44–1.00)
GFR calc Af Amer: 6 mL/min — ABNORMAL LOW (ref >60)
GFR calc non Af Amer: 5 mL/min — ABNORMAL LOW (ref >60)
GLUCOSE: 80 mg/dL (ref 65–99)
POTASSIUM: 4.8 mmol/L (ref 3.5–5.1)
Phosphorus: 4.8 mg/dL — ABNORMAL HIGH (ref 2.5–4.6)
Sodium: 134 mmol/L — ABNORMAL LOW (ref 135–145)

## 2017-03-19 LAB — PROTIME-INR
INR: 2.66
Prothrombin Time: 28.1 seconds — ABNORMAL HIGH (ref 11.4–15.2)

## 2017-03-19 LAB — HEPARIN LEVEL (UNFRACTIONATED)
HEPARIN UNFRACTIONATED: 0.25 [IU]/mL — AB (ref 0.30–0.70)
HEPARIN UNFRACTIONATED: 0.29 [IU]/mL — AB (ref 0.30–0.70)

## 2017-03-19 MED ORDER — WARFARIN SODIUM 5 MG PO TABS: 5.0000 mg | ORAL_TABLET | Freq: Once | ORAL | 0 refills | Status: DC

## 2017-03-19 MED ORDER — SODIUM CHLORIDE 0.9 % IV SOLN: 100.0000 mL | INTRAVENOUS | Status: DC | PRN

## 2017-03-19 MED ORDER — PENTAFLUOROPROP-TETRAFLUOROETH EX AERO
1.0000 "application " | INHALATION_SPRAY | CUTANEOUS | Status: DC | PRN
  Filled 2017-03-19: qty 30

## 2017-03-19 MED ORDER — ALTEPLASE 2 MG IJ SOLR: 2.0000 mg | Freq: Once | INTRAMUSCULAR | Status: DC | PRN

## 2017-03-19 MED ORDER — LIDOCAINE-PRILOCAINE 2.5-2.5 % EX CREA
1.0000 "application " | TOPICAL_CREAM | CUTANEOUS | Status: DC | PRN
  Filled 2017-03-19: qty 5

## 2017-03-19 MED ORDER — HEPARIN SODIUM (PORCINE) 1000 UNIT/ML DIALYSIS: 1000.0000 [IU] | INTRAMUSCULAR | Status: DC | PRN

## 2017-03-19 MED ORDER — LIDOCAINE HCL (PF) 1 % IJ SOLN: 5.0000 mL | INTRAMUSCULAR | Status: DC | PRN

## 2017-03-19 MED ORDER — WARFARIN SODIUM 5 MG PO TABS
5.0000 mg | ORAL_TABLET | Freq: Once | ORAL | Status: AC
  Administered 2017-03-19: 5 mg via ORAL
  Filled 2017-03-19: qty 1

## 2017-03-19 NOTE — Progress Notes (Addendum)
ANTICOAGULATION CONSULT NOTE - Follow Up Consult  Pharmacy Consult for Heparin > warfarin Indication: pulmonary embolus  No Known Allergies  Patient Measurements: Height: 5\' 6"  (167.6 cm) Weight: 171 lb 15.3 oz (78 kg) IBW/kg (Calculated) : 59.3  Vital Signs: Temp: 98.3 F (36.8 C) (11/01 0018) Temp Source: Oral (11/01 0018) BP: 144/92 (11/01 0018) Pulse Rate: 100 (11/01 0018)  Labs:  Recent Labs  03/17/17 0249 03/17/17 0355 03/17/17 1540 03/17/17 2054 03/18/17 0349 03/19/17 0352  HGB 8.8*  --  9.5*  --  9.2* 8.7*  HCT 28.0*  --  28.9*  --  29.5* 28.0*  PLT 169  --   --   --  196 214  LABPROT 19.5*  --   --   --  30.0* 28.1*  INR 1.67  --   --   --  2.89 2.66  HEPARINUNFRC 1.28* 0.48  --   --  0.70 0.25*  CREATININE  --   --  7.59*  --   --   --   TROPONINI  --   --   --  0.13* 0.24*  --     Estimated Creatinine Clearance: 10.5 mL/min (A) (by C-G formula based on SCr of 7.59 mg/dL (H)).   Assessment: 41 yo female with ESRD, on IV heparin for new PE, bridging to Coumadin (today will be day 5 overlap). Obtaining labs has been difficult due to limited access, order in place to stick feet as needed.  Heparin level subtherapeutic at 0.25 and INR therapeutic at 2.66. RN reports no issues with heparin infusion overnight. CBC stable, no overt s/s bleeding documented.   Goal of Therapy:  Heparin level 0.3-0.7 units/ml Monitor platelets by anticoagulation protocol: Yes   Plan:  Increase heparin gtt to 1200 units/hr Heparin level in 8 hrs Daily heparin level, INR, and CBC Monitor for s/s bleeding  Lavonda Jumbo, PharmD Clinical Pharmacist 03/19/17 5:28 AM

## 2017-03-19 NOTE — Progress Notes (Addendum)
ANTICOAGULATION CONSULT NOTE - Follow Up Consult  Pharmacy Consult for Heparin > warfarin Indication: pulmonary embolus  No Known Allergies  Patient Measurements: Height: 5\' 6"  (167.6 cm) Weight: 167 lb 12.3 oz (76.1 kg) IBW/kg (Calculated) : 59.3  Vital Signs: Temp: 98.4 F (36.9 C) (11/01 1345) Temp Source: Oral (11/01 1345) BP: 144/97 (11/01 1430) Pulse Rate: 82 (11/01 1430)  Labs:  Recent Labs  03/17/17 0249 03/17/17 0355 03/17/17 1540 03/17/17 2054 03/18/17 0349 03/19/17 0352  HGB 8.8*  --  9.5*  --  9.2* 8.7*  HCT 28.0*  --  28.9*  --  29.5* 28.0*  PLT 169  --   --   --  196 214  LABPROT 19.5*  --   --   --  30.0* 28.1*  INR 1.67  --   --   --  2.89 2.66  HEPARINUNFRC 1.28* 0.48  --   --  0.70 0.25*  CREATININE  --   --  7.59*  --   --   --   TROPONINI  --   --   --  0.13* 0.24*  --     Estimated Creatinine Clearance: 10.4 mL/min (A) (by C-G formula based on SCr of 7.59 mg/dL (H)).   Assessment: 41 yo female with ESRD, on IV heparin for new PE + Coumadin added (day 3 overlap). Obtaining labs has been difficult due to limited access, order in place to stick feet as needed. -heparin level is at top of  Goal 0.7 on 1250 units/hr - will decrease slightly -INR 1.67 > 2.89 big jump after 3 doses; but now trending back down today.  Probably will fall more after smaller dose of 1 mg that she received last night.  Heparin level just slightly below goal - but turning off shortly for discharge.  Goal of Therapy:  INR 2-3 Heparin level 0.3-0.7 units/ml Monitor platelets by anticoagulation protocol: Yes   Plan:  1. Coumadin 5 mg x 1 tonight.  Recommended to MD to give 5 mg daily at discharge until INR recheck on Monday. 2. D/c heparin?  Uvaldo Rising, BCPS  Clinical Pharmacist Pager [phone]  03/19/2017 2:42 PM

## 2017-03-19 NOTE — Care Management (Signed)
ED CM called concerning late discharge patient is going home on coumadin, confirmed $4 at [facility]. Patient has follow up appointment at HF clinic 11/5 at 10:30 at [place] office. Appointment also made by Unit CM at Dr. Maudie Mercury office in [place] on 11/9 at 11:25a. Update Katie RN on [place].

## 2017-03-19 NOTE — Discharge Summary (Signed)
Physician Discharge Summary  Maria Vazquez ZOX:[id number] DOB: 1975-12-12 DOA: 03/13/2017  PCP: Estanislado Emms, MD  Admit date: 03/13/2017 Discharge date: 03/19/2017  Time spent: 45 minutes  Recommendations for Outpatient Follow-up:  Patient will be discharged to home.  Patient will need to follow up with primary care provider within one week of discharge.  Continue dialysis as scheduled. Have INR checked on 03/23/2017 at [place] at [facility].  Patient should continue medications as prescribed.  Patient should follow a renal diet with 1269ml fluid restriction per day.   Discharge Diagnoses:  ESRD access problem Bilateral pulmonary emboli Chest pain/elevated troponin ESRD with hyperkalemia and metabolic acidosis HD catheter using  History of lupus Chronic anemia Seizure disorder  Discharge Condition: Stable  Diet recommendation: renal diet with 1263ml fluid restriction per day  [facility] Weights   03/18/17 0116 03/18/17 0356 03/19/17 0407  Weight: 75.5 kg (166 lb 7.2 oz) 77.2 kg (170 lb 3.1 oz) 78 kg (171 lb 15.3 oz)    History of present illness:  on 03/13/2017 by Dr. Sherwood Gambler Vazquez a 41 y.o.femalewith medical history significant for end-stage renal disease on hemodialysis, lupus, hypertension, seizure disorder, and chronic anemia, now presenting to the emergency department with chest pain and dialysis access problem. Patient reports completing dialysis on 03/10/2017, but was unable to on 03/12/2017 due to a clotted fistula. She presented today for declot, which was unsuccessful, and she had a dialysis catheter placed. Catheter was placed, she began to complain of chest pain, and was directed to the ED for further evaluation. She had been hypertensive, given clonidine, but vomited it back and was later given another dose which she tolerated.  Hospital Course:  ESRD access problem -Patient's had problems with access on 03/12/2017, attempted  to declot prior to admission however procedure unsuccessful. Patient did have catheter placed in left chest.  Bilateral pulmonary emboli -EKG with nonspecific ST-T wave abnormalities -Echocardiogram EF 50-55%, mild hypokinesis of the anteroseptal myocardium  -CTA chest showed bilateral PE -Placed on IV heparin with Coumadin -Patient with history of lupus, pending antiphospholipid syndrome results listed below -INR 2.66 today -repeat INR in 4 days -Will discharge patient with Coumadin 5mg  (discussed with pharmacy)  Chest pain/elevated troponin -Likely secondary to catheter access placement -Patient was given nitroglycerin without any resolution of her symptoms -Cardiology consulted and appreciated, but not be an acute coronary syndrome. Did not recommend further cardiac workup. PE may explain some of the chest pain and elevated troponin.  ESRD with hyperkalemia and metabolic acidosis -Patient dialyzes Tuesday, Thursday, Saturday -Nephrology consulted and appreciated -Continue to monitor renal function -Hyperkalemia correcting with dialysis  HD catheter using  -Continue to monitor hemoglobin and hematocrit   History of lupus -As above -anticardiolipin IgA 20, IgG and IgM <9 -PTT lupus anticoagulant 68.7, results consistent with presence of lupus anticoagulant  -Continue Plaquenil  Chronic anemia -Anemia panel showed adequate iron and stores -Vit B12 335  Seizure disorder -Continue Keppra  Procedures: Echocardiogram  Consultations: Nephrology Cardiology  Discharge Exam: Vitals:   03/19/17 0018 03/19/17 0759  BP: (!) 144/92 (!) 139/95  Pulse: 100 93  Resp: 20 17  Temp: 98.3 F (36.8 C) 98.3 F (36.8 C)  SpO2: 99% 100%   Patient feeling better today. Denies chest pain, shortness of breath, abdominal pain, N/V/D/C, headache, dizziness.    General: Well developed, well nourished, NAD, appears stated age  HEENT: NCAT, mucous membranes  moist.  Cardiovascular: S1 S2 auscultated, no rubs, murmurs or gallops. Regular  rate and rhythm.  Respiratory: Clear to auscultation bilaterally with equal chest rise  Abdomen: Soft, nontender, nondistended, + bowel sounds  Extremities: warm dry without cyanosis clubbing or edema  Neuro: AAOx3, nonfocal  Psych: Normal affect and demeanor with intact judgement and insight  Discharge Instructions  Current Discharge Medication List    CONTINUE these medications which have NOT CHANGED   Details  acetaminophen (TYLENOL) 500 MG tablet Take 500 mg by mouth every 6 (six) hours as needed for mild pain or moderate pain.    calcium acetate (PHOSLO) 667 MG capsule Take 667 mg by mouth 3 (three) times daily with meals.     ferric citrate (AURYXIA) 1 GM 210 MG(Fe) tablet Take 630 mg by mouth 2 (two) times daily.    hydroxychloroquine (PLAQUENIL) 200 MG tablet Take 1 tablet (200 mg total) by mouth daily. Qty: 30 tablet, Refills: 0    levETIRAcetam (KEPPRA) 500 MG tablet Take 500 mg by mouth See admin instructions. Pt to take 1 tablet twice daily - and to take an additional tablet after dialysis on Tues Thurs and Sat    nortriptyline (PAMELOR) 50 MG capsule Take 50 mg by mouth daily.       No Known Allergies Follow-up Information    [facility] Follow up.   Why:  Heart Failure/Coumadin Clinic (1st floor) cardiology appointment at 10:30am.  Please also note that you will also need to report to the clinic as instructed for INR lab draws Contact information: [address] [phone]       Dr Maudie Mercury / Nurse Practioner Nettie El[facility] Follow up on 03/27/2017.   Why:  Primary Care Appt at 11:25am.  If you can not make this appt please call and cancel.  This appointment will cost approx $180.00 Contact information: [address]  [place], [place] [address] [facility] Follow up.   Why:  Please call and  request information on establishing a primary care doctor - this facility will offer cost assistance  Contact information: [address] [place], [place] [address] [phone]           The results of significant diagnostics from this hospitalization (including imaging, microbiology, ancillary and laboratory) are listed below for reference.    Significant Diagnostic Studies: Ct Angio Chest Pe W Or Wo Contrast  Addendum Date: 03/14/2017   ADDENDUM REPORT: 03/14/2017 10:39 ADDENDUM: These results were called by telephone on 03/14/2017 at 10:20 am to Dr. Niel Hummer , who verbally acknowledged these results. Electronically Signed   By: Titus Dubin M.D.   On: 03/14/2017 10:39   Result Date: 03/14/2017 CLINICAL DATA:  Chest pain and shortness of breath. Evaluate for pulmonary embolism. EXAM: CT ANGIOGRAPHY CHEST WITH CONTRAST TECHNIQUE: Multidetector CT imaging of the chest was performed using the standard protocol during bolus administration of intravenous contrast. Multiplanar CT image reconstructions and MIPs were obtained to evaluate the vascular anatomy. CONTRAST:  100 cc Isovue 370 intravenous contrast. COMPARISON:  CT chest dated March 15, 2011. FINDINGS: Cardiovascular: Satisfactory opacification of the pulmonary arteries to the segmental level. There are bilateral segmental and subsegmental pulmonary emboli involving the lingula, right upper lobe, and bilateral lower lobes. The RV/LV ratio is normal, measuring 0.73. Normal heart size. No pericardial effusion. Normal caliber thoracic aorta. Mediastinum/Nodes: No enlarged mediastinal, hilar, or axillary lymph nodes. Thyroid gland, trachea, and esophagus demonstrate no significant findings. Lungs/Pleura: Bibasilar atelectasis. Lungs are otherwise  clear. No pleural effusion or pneumothorax. Upper Abdomen: No acute abnormality. Prior granulomatous disease in the spleen. Cholecystectomy. Musculoskeletal: No chest wall abnormality. No acute  or significant osseous findings. Partially visualized right arm AV fistula which appears at least partially thrombosed. Small amount of subcutaneous edema in the right axilla and along the proximal right upper arm. Review of the MIP images confirms the above findings. IMPRESSION: 1. Positive for acute segmental and subsegmental pulmonary emboli without CT evidence of right heart strain (RV/LV Ratio = 0.73). 2. Partially visualized right arm AV fistula which appears at least partially thrombosed. Radiology assistant personnel have been notified to put me in telephone contact with the referring physician or the referring physician's clinical representative in order to discuss these findings. Once this communication is established I will issue an addendum to this report for documentation purposes. Electronically Signed: By: Titus Dubin M.D. On: 03/14/2017 10:11   Ir Fluoro Guide Cv Line Left  Result Date: 03/13/2017 CLINICAL DATA:  End-stage renal disease with occluded hemodialysis fistula which could not be successfully treated with percutaneous techniques. Intermediate term dialysis access is therefore needed. EXAM: TUNNELED HEMODIALYSIS CATHETER PLACEMENT WITH ULTRASOUND AND FLUOROSCOPIC GUIDANCE TECHNIQUE: The procedure, risks, benefits, and alternatives were explained to the patient with the aid of interpreter. Questions regarding the procedure were encouraged and answered. The patient understands and consents to the procedure. Patency of the right IJ vein could not be confirmed on ultrasound. Patency of the left IJ vein was confirmed with ultrasound with image documentation. An appropriate skin site was determined. Region was prepped using maximum barrier technique including cap and mask, sterile gown, sterile gloves, large sterile sheet, and Chlorhexidine as cutaneous antisepsis. The region was infiltrated locally with 1% lidocaine. Intravenous Fentanyl and Versed were administered as conscious sedation  during continuous monitoring of the patient's level of consciousness and physiological / cardiorespiratory status by the radiology RN, with a total moderate sedation time of 92 minutes including previous attempted declot. Under real-time ultrasound guidance, the left IJ vein was accessed with a 21 gauge micropuncture needle; the needle tip within the vein was confirmed with ultrasound image documentation. Needle exchanged over the 018 guidewire for transitional dilator, which allowed advancement of a Benson wire into the IVC. Over this, an MPA catheter was advanced. A Hemosplit 19 hemodialysis catheter was tunneled from the left anterior chest wall approach to the left IJ dermatotomy site. The MPA catheter was exchanged over an Amplatz wire for serial vascular dilators which allow placement of a peel-away sheath, through which the catheter was advanced under intermittent fluoroscopy, positioned with its tips in the proximal and midright atrium. Spot chest radiograph confirms good catheter position. No pneumothorax. Catheter was flushed and primed per protocol. Catheter secured externally with O Prolene sutures. The left IJ dermatotomy site was closed with Dermabond. COMPLICATIONS: COMPLICATIONS None immediate FLUOROSCOPY TIME:  10 minutes 24 seconds, 27 mGy COMPARISON:  None IMPRESSION: 1. Technically successful placement of tunneled left IJ hemodialysis catheter with ultrasound and fluoroscopic guidance. Ready for routine use. ACCESS: Remains approachable for percutaneous intervention as needed. Electronically Signed   By: Lucrezia Europe M.D.   On: 03/13/2017 13:34   Ir US Guide Vasc Access Left  Result Date: 03/13/2017 CLINICAL DATA:  End-stage renal disease with occluded hemodialysis fistula which could not be successfully treated with percutaneous techniques. Intermediate term dialysis access is therefore needed. EXAM: TUNNELED HEMODIALYSIS CATHETER PLACEMENT WITH ULTRASOUND AND FLUOROSCOPIC GUIDANCE TECHNIQUE:  The procedure, risks, benefits, and alternatives were explained  to the patient with the aid of interpreter. Questions regarding the procedure were encouraged and answered. The patient understands and consents to the procedure. Patency of the right IJ vein could not be confirmed on ultrasound. Patency of the left IJ vein was confirmed with ultrasound with image documentation. An appropriate skin site was determined. Region was prepped using maximum barrier technique including cap and mask, sterile gown, sterile gloves, large sterile sheet, and Chlorhexidine as cutaneous antisepsis. The region was infiltrated locally with 1% lidocaine. Intravenous Fentanyl and Versed were administered as conscious sedation during continuous monitoring of the patient's level of consciousness and physiological / cardiorespiratory status by the radiology RN, with a total moderate sedation time of 92 minutes including previous attempted declot. Under real-time ultrasound guidance, the left IJ vein was accessed with a 21 gauge micropuncture needle; the needle tip within the vein was confirmed with ultrasound image documentation. Needle exchanged over the 018 guidewire for transitional dilator, which allowed advancement of a Benson wire into the IVC. Over this, an MPA catheter was advanced. A Hemosplit 19 hemodialysis catheter was tunneled from the left anterior chest wall approach to the left IJ dermatotomy site. The MPA catheter was exchanged over an Amplatz wire for serial vascular dilators which allow placement of a peel-away sheath, through which the catheter was advanced under intermittent fluoroscopy, positioned with its tips in the proximal and midright atrium. Spot chest radiograph confirms good catheter position. No pneumothorax. Catheter was flushed and primed per protocol. Catheter secured externally with O Prolene sutures. The left IJ dermatotomy site was closed with Dermabond. COMPLICATIONS: COMPLICATIONS None immediate  FLUOROSCOPY TIME:  10 minutes 24 seconds, 27 mGy COMPARISON:  None IMPRESSION: 1. Technically successful placement of tunneled left IJ hemodialysis catheter with ultrasound and fluoroscopic guidance. Ready for routine use. ACCESS: Remains approachable for percutaneous intervention as needed. Electronically Signed   By: Lucrezia Europe M.D.   On: 03/13/2017 13:34   Ir US Guide Vasc Access Right  Result Date: 03/13/2017 CLINICAL DATA:  Occluded right upper arm hemodialysis fistula. EXAM: DIALYSIS FISTULA DECLOT (ATTEMPTED) VENOUS ANGIOPLASTY ULTRASOUND GUIDANCE FOR VASCULAR ACCESS FLUOROSCOPY TIME:  10 minutes 24 seconds, 27 mgy TECHNIQUE: The procedure, risks (including but not limited to bleeding, infection, organ damage ), benefits, and alternatives were explained to the patient with the aid of interpreter. Questions regarding the procedure were encouraged and answered. The patient understands and consents to the procedure. Intravenous Fentanyl and Versed were administered as conscious sedation during continuous monitoring of the patient's level of consciousness and physiological / cardiorespiratory status by the radiology RN, with a total moderate sedation time of 92 minutes. The venous outflow just central to the fistula was accessed antegrade with 21-gauge micropuncture needle under real-time ultrasonic guidance after the overlying skin prepped with Betadine, draped in usual sterile fashion, infiltrated locally with 1%lidocaine. Needle exchanged over 018guidewire for transitional dilator through which 4 mg t-PA was administered. Ultrasound images were stored. Through the antegrade dilator, a Bentson wire was advanced . Over this a 71F sheath was placed, through which a 5 [place] Kumpe catheter was advanced for outflow venography. This showed patency of the central venous system through the SVC . A short segment high-grade stenosis in the cephalic arch just peripheral to its confluence with the axillary vein was  identified. 3000 units heparin were administered IV. The Kumpe was exchanged over guide wire for the AngioJet device, used to mechanically thrombolyse the clot in the outflow vein and remove the thrombus from the native  outflow. This was exchanged for a 28mm x 4 cm Mustang angioplasty balloon, advanced to the outflow vein. Angioplasty of the outflow vein and tandem venous stenoses was performed. Injection showed partial clearance of thrombus from the outflow vein with antegrade flow. No extravasation or apparent complication of angioplasty. The Angiojet catheter was then advanced again 2 further extract partially occlusive thrombus still in the aneurysmal segments of the outflow vein. Despite multiple passes with intermittent follow-up venography, good antegrade flow through the outflow vein could not be achieved. In fact, there is re- configuration of thrombus such that there is near occlusion of the outflow vein in the aneurysmal segment just proximal to the return to more normal caliber centrally. Once the physiologic limited the Angiojet performance had been reached, the procedure was terminated. The catheter and sheath then removed and hemostasis achieved with 2-0 Ethilon suture . Patient tolerated procedure well. IMPRESSION: 1. Unsuccessful declot of right brachiocephalic fistula secondary to large amount of occlusive thrombus in the aneurysmal segment of the outflow cephalic vein. 2. Technically successful balloon angioplasty of central outflow venous stenosis. ACCESS: Plan for tunneled hemodialysis catheter placement to follow. Will need surgical fistula revision/ replacement. Electronically Signed   By: Lucrezia Europe M.D.   On: 03/13/2017 13:31   Dg Chest Port 1 View  Result Date: 03/17/2017 CLINICAL DATA:  Chest pain tonight. EXAM: PORTABLE CHEST 1 VIEW COMPARISON:  CT 3 days prior 03/14/2017, radiograph 03/13/2017 FINDINGS: Tip of the left internal jugular venous catheter at atrial caval junction. Normal  heart size and mediastinal contours. No pulmonary edema, focal consolidation, pneumothorax or pleural fluid. IMPRESSION: 1.  No acute pulmonary process. 2. Tip of the left dialysis catheter at the atrial caval junction. Electronically Signed   By: Jeb Levering M.D.   On: 03/17/2017 23:54   Dg Chest Port 1 View  Result Date: 03/13/2017 CLINICAL DATA:  Chest pain and shortness of breath EXAM: PORTABLE CHEST 1 VIEW COMPARISON:  02/14/2017 FINDINGS: Left-sided central venous catheter tip overlies the proximal right atrium. No pneumothorax. No acute consolidation or effusion. Borderline cardiomegaly. IMPRESSION: 1. Left-sided central venous catheter tip overlies the right atrium 2. Mildly low lung volumes.  Negative for edema or infiltrate. Electronically Signed   By: Donavan Foil M.D.   On: 03/13/2017 15:51   Ir Thrombectomy Av Fistula/w Thrombolysis/pta Inc Shunt/img Right  Result Date: 03/13/2017 CLINICAL DATA:  Occluded right upper arm hemodialysis fistula. EXAM: DIALYSIS FISTULA DECLOT (ATTEMPTED) VENOUS ANGIOPLASTY ULTRASOUND GUIDANCE FOR VASCULAR ACCESS FLUOROSCOPY TIME:  10 minutes 24 seconds, 27 mgy TECHNIQUE: The procedure, risks (including but not limited to bleeding, infection, organ damage ), benefits, and alternatives were explained to the patient with the aid of interpreter. Questions regarding the procedure were encouraged and answered. The patient understands and consents to the procedure. Intravenous Fentanyl and Versed were administered as conscious sedation during continuous monitoring of the patient's level of consciousness and physiological / cardiorespiratory status by the radiology RN, with a total moderate sedation time of 92 minutes. The venous outflow just central to the fistula was accessed antegrade with 21-gauge micropuncture needle under real-time ultrasonic guidance after the overlying skin prepped with Betadine, draped in usual sterile fashion, infiltrated locally with  1%lidocaine. Needle exchanged over 018guidewire for transitional dilator through which 4 mg t-PA was administered. Ultrasound images were stored. Through the antegrade dilator, a Bentson wire was advanced . Over this a 20F sheath was placed, through which a 5 [place] Kumpe catheter was advanced for outflow venography. This  showed patency of the central venous system through the SVC . A short segment high-grade stenosis in the cephalic arch just peripheral to its confluence with the axillary vein was identified. 3000 units heparin were administered IV. The Kumpe was exchanged over guide wire for the AngioJet device, used to mechanically thrombolyse the clot in the outflow vein and remove the thrombus from the native outflow. This was exchanged for a 85mm x 4 cm Mustang angioplasty balloon, advanced to the outflow vein. Angioplasty of the outflow vein and tandem venous stenoses was performed. Injection showed partial clearance of thrombus from the outflow vein with antegrade flow. No extravasation or apparent complication of angioplasty. The Angiojet catheter was then advanced again 2 further extract partially occlusive thrombus still in the aneurysmal segments of the outflow vein. Despite multiple passes with intermittent follow-up venography, good antegrade flow through the outflow vein could not be achieved. In fact, there is re- configuration of thrombus such that there is near occlusion of the outflow vein in the aneurysmal segment just proximal to the return to more normal caliber centrally. Once the physiologic limited the Angiojet performance had been reached, the procedure was terminated. The catheter and sheath then removed and hemostasis achieved with 2-0 Ethilon suture . Patient tolerated procedure well. IMPRESSION: 1. Unsuccessful declot of right brachiocephalic fistula secondary to large amount of occlusive thrombus in the aneurysmal segment of the outflow cephalic vein. 2. Technically successful balloon  angioplasty of central outflow venous stenosis. ACCESS: Plan for tunneled hemodialysis catheter placement to follow. Will need surgical fistula revision/ replacement. Electronically Signed   By: Lucrezia Europe M.D.   On: 03/13/2017 13:31    Microbiology: Recent Results (from the past 240 hour(s))  MRSA PCR Screening     Status: None   Collection Time: 03/14/17 12:32 AM  Result Value Ref Range Status   MRSA by PCR NEGATIVE NEGATIVE Final    Comment:        The GeneXpert MRSA Assay (FDA approved for NASAL specimens only), is one component of a comprehensive MRSA colonization surveillance program. It is not intended to diagnose MRSA infection nor to guide or monitor treatment for MRSA infections.      Labs: Basic Metabolic Panel:  Recent Labs Lab 03/13/17 0854 03/13/17 1754  03/14/17 0206 03/14/17 0744 03/14/17 1056 03/15/17 1003 03/17/17 1540  NA 139 136  --  136  --   --  133* 132*  K 5.1 7.1*  < > 4.1 4.8 4.5 5.3* 4.9  CL 100* 99*  --  98*  --   --  96* 95*  CO2 24 19*  --  27  --   --  24 25  GLUCOSE 87 119*  --  101*  --   --  94 96  BUN 53* 60*  --  23*  --   --  45* 24*  CREATININE 12.42* 12.96*  --  6.97*  --   --  10.54* 7.59*  CALCIUM 8.7* 8.3*  --  8.5*  --   --  8.4* 8.8*  PHOS  --   --   --   --   --   --  7.3*  --   < > = values in this interval not displayed. Liver Function Tests:  Recent Labs Lab 03/15/17 1003  ALBUMIN 3.0*   No results for input(s): LIPASE, AMYLASE in the last 168 hours. No results for input(s): AMMONIA in the last 168 hours. CBC:  Recent Labs Lab 03/13/17 1540  03/15/17 0154 03/16/17 0243 03/17/17 0249 03/17/17 1540 03/18/17 0349 03/19/17 0352  WBC 10.9*  < > 8.3 9.8 9.6  --  8.5 7.0  NEUTROABS 9.1*  --   --   --   --   --   --   --   HGB 9.8*  < > 9.6* 9.0* 8.8* 9.5* 9.2* 8.7*  HCT 31.5*  < > 29.9* 28.1* 28.0* 28.9* 29.5* 28.0*  MCV 95.2  < > 92.6 90.9 92.7  --  93.9 93.6  PLT 150  < > 131* 156 169  --  196 214  < > =  values in this interval not displayed. Cardiac Enzymes:  Recent Labs Lab 03/14/17 0206 03/14/17 0744 03/14/17 1056 03/17/17 2054 03/18/17 0349  TROPONINI 0.28* 0.21* 0.16* 0.13* 0.24*   BNP: BNP (last 3 results) No results for input(s): BNP in the last 8760 hours.  ProBNP (last 3 results) No results for input(s): PROBNP in the last 8760 hours.  CBG:  Recent Labs Lab 03/13/17 1941  GLUCAP 125*       Signed:  Cristal Ford  [facility] 03/19/2017, 11:25 AM

## 2017-03-19 NOTE — Progress Notes (Signed)
Discharge instructions provided to patient via [employer] interpreter services in [place].  Pt instructed not to take coumadin this evening as patient to receive prior to discharge.  Pt to pick up prescriptions at [facility] in [place] and begin tomorrow evening.  On Call case manager contacted to verify no outstanding issues.  Appointments, medications, and INR labs set up and verified with case manager.  Patient instructed on follow-up and patient verbalizes understanding with no questions via [place] interpreter. IV and telemetry discontinued.  Fistula and HD catheter sites assessed for bleeding and hematoma.  No bleeding or hematoma noted. VS obtained prior to discharged and no abnormalities noted.

## 2017-03-19 NOTE — Progress Notes (Signed)
[facility] Progress Note   Dialysis: TTS [facility] 3.5h   2/2.25  75.5kg   Hep 7800 -mircera 75 ug q 2wks -calc 0.25 ug tiw    Assessment:   1. Clotted R AVF - sp unsuccessful attempt to declot 10/26 by IR on day of admission, new L IJ TDC was placed. Declot procedure was c/b bilat PE, see below.  Will need new perm access, VVS has seen and will plan for this in next few weeks after DC.   2. Acute bilat pulm emboli - complication of attempted declot procedure. Stable, no further CP or SOB, on coumadin.  Have d/w with IR who said the clot burden was "small". 3 months of coumadin should suffice, have d/w primary.   3. Bleeding TDC site - has had 2 episodes, have d/w IR, suspect due to anticoag. Observe cath today at HD.  4. ESRD TTS [place]. HD today 2. HTN/ vol  - BP's good. No BP meds at home. Up 2kg by wts. Stable vol on exam.  3. SLE - stable. 4. Anemia - Mircera 58mcg q2weeks. 5. HPTH - Calcitriol 0.81mcg TIW    Plan - HD today, if cath does well she will be ok for dc home after HD. Have d/w primary team.   Kelly Splinter MD [facility] pgr [phone]   03/19/2017, 11:09 AM     Subjective:   NO further bleeding from cath site. INR 2.6   Objective:   BP (!) 139/95 (BP Location: Left Arm)   Pulse 93   Temp 98.3 F (36.8 C) (Oral)   Resp 17   Ht 5\' 6"  (1.676 m)   Wt 78 kg (171 lb 15.3 oz)   LMP 02/11/2017   SpO2 100%   BMI 27.75 kg/m   Intake/Output Summary (Last 24 hours) at 03/19/17 1109 Last data filed at 03/19/17 0600  Gross per 24 hour  Intake           410.35 ml  Output                0 ml  Net           410.35 ml   Weight change: 0.9 kg (1 lb 15.8 oz)  Physical Exam: General appearance: alert, cooperative and appears stated age Head: Normocephalic, without obvious abnormality, atraumatic Back: symmetric, no curvature. ROM normal. No CVA tenderness. Resp: clear to auscultation bilaterally Cardio: regular rate and  rhythm, S1, S2 normal, no murmur, click, rub or gallop GI: soft, non-tender; bowel sounds normal; no masses, no organomegaly Extremities: extremities normal, atraumatic, no cyanosis or edema Pulses: 2+ and symmetric HD Access: rt BCF thrombosed; very weak bruit no longer present  Imaging: Dg Chest Port 1 View  Result Date: 03/17/2017 CLINICAL DATA:  Chest pain tonight. EXAM: PORTABLE CHEST 1 VIEW COMPARISON:  CT 3 days prior 03/14/2017, radiograph 03/13/2017 FINDINGS: Tip of the left internal jugular venous catheter at atrial caval junction. Normal heart size and mediastinal contours. No pulmonary edema, focal consolidation, pneumothorax or pleural fluid. IMPRESSION: 1.  No acute pulmonary process. 2. Tip of the left dialysis catheter at the atrial caval junction. Electronically Signed   By: Jeb Levering M.D.   On: 03/17/2017 23:54    Labs: BMET  Recent Labs Lab 03/13/17 0854 03/13/17 1754 03/13/17 2319 03/14/17 0206 03/14/17 0744 03/14/17 1056 03/15/17 1003 03/17/17 1540  NA 139 136  --  136  --   --  133* 132*  K  5.1 7.1* 3.1* 4.1 4.8 4.5 5.3* 4.9  CL 100* 99*  --  98*  --   --  96* 95*  CO2 24 19*  --  27  --   --  24 25  GLUCOSE 87 119*  --  101*  --   --  94 96  BUN 53* 60*  --  23*  --   --  45* 24*  CREATININE 12.42* 12.96*  --  6.97*  --   --  10.54* 7.59*  CALCIUM 8.7* 8.3*  --  8.5*  --   --  8.4* 8.8*  PHOS  --   --   --   --   --   --  7.3*  --    CBC  Recent Labs Lab 03/13/17 1540  03/16/17 0243 03/17/17 0249 03/17/17 1540 03/18/17 0349 03/19/17 0352  WBC 10.9*  < > 9.8 9.6  --  8.5 7.0  NEUTROABS 9.1*  --   --   --   --   --   --   HGB 9.8*  < > 9.0* 8.8* 9.5* 9.2* 8.7*  HCT 31.5*  < > 28.1* 28.0* 28.9* 29.5* 28.0*  MCV 95.2  < > 90.9 92.7  --  93.9 93.6  PLT 150  < > 156 169  --  196 214  < > = values in this interval not displayed.  Medications:    . calcium acetate  667 mg Oral TID WC  . diphenhydrAMINE  12.5 mg Intravenous Once  .  ferric citrate  630 mg Oral BID WC  . hydroxychloroquine  200 mg Oral Daily  . levETIRAcetam  500 mg Oral BID  . levETIRAcetam  500 mg Oral Q T,Th,Sa-HD  . metoCLOPramide (REGLAN) injection  5 mg Intravenous Once  . nortriptyline  50 mg Oral QHS  . senna-docusate  1 tablet Oral Daily  . sodium chloride flush  3 mL Intravenous Q12H  . sodium chloride flush  3 mL Intravenous Q12H  . Warfarin - Pharmacist Dosing Inpatient   Does not apply [phone]

## 2017-03-20 ENCOUNTER — Other Ambulatory Visit: Payer: Self-pay | Admitting: *Deleted

## 2017-03-20 LAB — BPAM RBC
BLOOD PRODUCT EXPIRATION DATE: 201811292359
Blood Product Expiration Date: 201811292359
UNIT TYPE AND RH: 5100
Unit Type and Rh: 5100

## 2017-03-20 LAB — TYPE AND SCREEN
ABO/RH(D): O POS
ANTIBODY SCREEN: POSITIVE
Donor AG Type: NEGATIVE
Donor AG Type: NEGATIVE
Unit division: 0
Unit division: 0

## 2017-03-27 ENCOUNTER — Other Ambulatory Visit (HOSPITAL_COMMUNITY)
Admission: RE | Admit: 2017-03-27 | Discharge: 2017-03-27 | Disposition: A | Discharge status: Home / Self Care | Payer: Self-pay | Source: Ambulatory Visit | Attending: Cardiology | Admitting: Cardiology

## 2017-03-27 ENCOUNTER — Ambulatory Visit (INDEPENDENT_AMBULATORY_CARE_PROVIDER_SITE_OTHER): Payer: Self-pay | Admitting: *Deleted

## 2017-03-27 ENCOUNTER — Telehealth: Payer: Self-pay | Admitting: *Deleted

## 2017-03-27 DIAGNOSIS — I2699 Other pulmonary embolism without acute cor pulmonale: Secondary | ICD-10-CM | POA: Insufficient documentation

## 2017-03-27 DIAGNOSIS — Z5181 Encounter for therapeutic drug level monitoring: Secondary | ICD-10-CM

## 2017-03-27 LAB — POCT INR: INR: 5.3

## 2017-03-27 LAB — PROTIME-INR
INR: 5.31 — AB
PROTHROMBIN TIME: 48 s — AB (ref 11.4–15.2)

## 2017-03-27 NOTE — Progress Notes (Signed)
POC >8.0   [facility] Lab INR 5.3 Hold coumadin tonight and tomorrow night.  Take 1/2 tablet Sunday night and recheck INR on Monday. Derenda Mis is the interpreter for patient.  INR results given to [place] who will call and give instructions to pt.  Pt will hold coumadin tonight and tomorrow night, take 1/2 tablet on Sunday and recheck INR on Monday. Laroy Apple @ [facility] to set up appt for [facility]) to come with pt on Monday 03/30/17

## 2017-03-27 NOTE — Telephone Encounter (Signed)
Please give the pt's interpreter, Shirlee Limerick a call @ [phone]. She left a VM

## 2017-03-27 NOTE — Telephone Encounter (Signed)
Derenda Mis is the interpreter for patient.  INR results given to [place] who will call and give instructions to pt.  Pt will hold coumadin tonight and tomorrow night, take 1/2 tablet on Sunday and recheck INR on Monday.  See coumadin note.

## 2017-03-30 ENCOUNTER — Ambulatory Visit (INDEPENDENT_AMBULATORY_CARE_PROVIDER_SITE_OTHER): Payer: Self-pay | Admitting: *Deleted

## 2017-03-30 DIAGNOSIS — Z5181 Encounter for therapeutic drug level monitoring: Secondary | ICD-10-CM

## 2017-03-30 DIAGNOSIS — I2699 Other pulmonary embolism without acute cor pulmonale: Secondary | ICD-10-CM

## 2017-03-30 LAB — POCT INR: INR: 2.1

## 2017-04-01 ENCOUNTER — Inpatient Hospital Stay (HOSPITAL_COMMUNITY)
Admission: EM | Admit: 2017-04-01 | Discharge: 2017-04-04 | DRG: 299 | Disposition: A | Discharge status: Home / Self Care | Payer: Medicaid Other | Attending: Internal Medicine | Admitting: Internal Medicine

## 2017-04-01 ENCOUNTER — Encounter (HOSPITAL_COMMUNITY): Payer: Self-pay | Admitting: Emergency Medicine

## 2017-04-01 ENCOUNTER — Other Ambulatory Visit: Payer: Self-pay

## 2017-04-01 DIAGNOSIS — M3214 Glomerular disease in systemic lupus erythematosus: Secondary | ICD-10-CM | POA: Diagnosis present

## 2017-04-01 DIAGNOSIS — I8290 Acute embolism and thrombosis of unspecified vein: Secondary | ICD-10-CM

## 2017-04-01 DIAGNOSIS — N2581 Secondary hyperparathyroidism of renal origin: Secondary | ICD-10-CM | POA: Diagnosis present

## 2017-04-01 DIAGNOSIS — T82898D Other specified complication of vascular prosthetic devices, implants and grafts, subsequent encounter: Secondary | ICD-10-CM

## 2017-04-01 DIAGNOSIS — I809 Phlebitis and thrombophlebitis of unspecified site: Principal | ICD-10-CM | POA: Diagnosis present

## 2017-04-01 DIAGNOSIS — N186 End stage renal disease: Secondary | ICD-10-CM

## 2017-04-01 DIAGNOSIS — Z9049 Acquired absence of other specified parts of digestive tract: Secondary | ICD-10-CM

## 2017-04-01 DIAGNOSIS — I120 Hypertensive chronic kidney disease with stage 5 chronic kidney disease or end stage renal disease: Secondary | ICD-10-CM | POA: Diagnosis present

## 2017-04-01 DIAGNOSIS — Z833 Family history of diabetes mellitus: Secondary | ICD-10-CM

## 2017-04-01 DIAGNOSIS — G40909 Epilepsy, unspecified, not intractable, without status epilepticus: Secondary | ICD-10-CM | POA: Diagnosis present

## 2017-04-01 DIAGNOSIS — Z7901 Long term (current) use of anticoagulants: Secondary | ICD-10-CM

## 2017-04-01 DIAGNOSIS — Z86001 Personal history of in-situ neoplasm of cervix uteri: Secondary | ICD-10-CM

## 2017-04-01 DIAGNOSIS — D638 Anemia in other chronic diseases classified elsewhere: Secondary | ICD-10-CM | POA: Diagnosis present

## 2017-04-01 DIAGNOSIS — M898X9 Other specified disorders of bone, unspecified site: Secondary | ICD-10-CM | POA: Diagnosis present

## 2017-04-01 DIAGNOSIS — I2699 Other pulmonary embolism without acute cor pulmonale: Secondary | ICD-10-CM | POA: Diagnosis present

## 2017-04-01 DIAGNOSIS — T8249XA Other complication of vascular dialysis catheter, initial encounter: Secondary | ICD-10-CM | POA: Diagnosis present

## 2017-04-01 DIAGNOSIS — Z992 Dependence on renal dialysis: Secondary | ICD-10-CM

## 2017-04-01 DIAGNOSIS — Z86711 Personal history of pulmonary embolism: Secondary | ICD-10-CM

## 2017-04-01 NOTE — ED Triage Notes (Addendum)
Pt had dialysis port placed on Oct 26th in right chest. Pt's old graft in right arm has blood clot- that is why port was placed. Pt here for pain in right arm around old fistula and swelling at the that site and pain into shoulder. Weak, palpable right radial pulse; sensation and movement intact in right hand.

## 2017-04-02 ENCOUNTER — Observation Stay (HOSPITAL_BASED_OUTPATIENT_CLINIC_OR_DEPARTMENT_OTHER): Payer: Medicaid Other

## 2017-04-02 ENCOUNTER — Observation Stay (HOSPITAL_COMMUNITY): Payer: Medicaid Other

## 2017-04-02 DIAGNOSIS — Z992 Dependence on renal dialysis: Secondary | ICD-10-CM | POA: Diagnosis not present

## 2017-04-02 DIAGNOSIS — N2581 Secondary hyperparathyroidism of renal origin: Secondary | ICD-10-CM | POA: Diagnosis present

## 2017-04-02 DIAGNOSIS — I809 Phlebitis and thrombophlebitis of unspecified site: Secondary | ICD-10-CM | POA: Diagnosis present

## 2017-04-02 DIAGNOSIS — Z9049 Acquired absence of other specified parts of digestive tract: Secondary | ICD-10-CM | POA: Diagnosis not present

## 2017-04-02 DIAGNOSIS — Z86001 Personal history of in-situ neoplasm of cervix uteri: Secondary | ICD-10-CM | POA: Diagnosis not present

## 2017-04-02 DIAGNOSIS — N186 End stage renal disease: Secondary | ICD-10-CM | POA: Diagnosis present

## 2017-04-02 DIAGNOSIS — Z7901 Long term (current) use of anticoagulants: Secondary | ICD-10-CM | POA: Diagnosis not present

## 2017-04-02 DIAGNOSIS — M79609 Pain in unspecified limb: Secondary | ICD-10-CM

## 2017-04-02 DIAGNOSIS — Z833 Family history of diabetes mellitus: Secondary | ICD-10-CM | POA: Diagnosis not present

## 2017-04-02 DIAGNOSIS — Z86711 Personal history of pulmonary embolism: Secondary | ICD-10-CM | POA: Diagnosis not present

## 2017-04-02 DIAGNOSIS — G40909 Epilepsy, unspecified, not intractable, without status epilepticus: Secondary | ICD-10-CM | POA: Diagnosis present

## 2017-04-02 DIAGNOSIS — D638 Anemia in other chronic diseases classified elsewhere: Secondary | ICD-10-CM | POA: Diagnosis present

## 2017-04-02 DIAGNOSIS — M7989 Other specified soft tissue disorders: Secondary | ICD-10-CM

## 2017-04-02 DIAGNOSIS — I8290 Acute embolism and thrombosis of unspecified vein: Secondary | ICD-10-CM | POA: Diagnosis present

## 2017-04-02 DIAGNOSIS — I120 Hypertensive chronic kidney disease with stage 5 chronic kidney disease or end stage renal disease: Secondary | ICD-10-CM | POA: Diagnosis present

## 2017-04-02 DIAGNOSIS — M898X9 Other specified disorders of bone, unspecified site: Secondary | ICD-10-CM | POA: Diagnosis present

## 2017-04-02 DIAGNOSIS — M3214 Glomerular disease in systemic lupus erythematosus: Secondary | ICD-10-CM | POA: Diagnosis present

## 2017-04-02 LAB — CBC WITH DIFFERENTIAL/PLATELET
Basophils Absolute: 0 10*3/uL (ref 0.0–0.1)
Basophils Relative: 0 %
Eosinophils Absolute: 0.2 10*3/uL (ref 0.0–0.7)
Eosinophils Relative: 2 %
HEMATOCRIT: 31.6 % — AB (ref 36.0–46.0)
HEMOGLOBIN: 9.8 g/dL — AB (ref 12.0–15.0)
LYMPHS ABS: 2.5 10*3/uL (ref 0.7–4.0)
LYMPHS PCT: 27 %
MCH: 29.2 pg (ref 26.0–34.0)
MCHC: 31 g/dL (ref 30.0–36.0)
MCV: 94 fL (ref 78.0–100.0)
MONOS PCT: 5 %
Monocytes Absolute: 0.5 10*3/uL (ref 0.1–1.0)
NEUTROS ABS: 6.1 10*3/uL (ref 1.7–7.7)
NEUTROS PCT: 66 %
Platelets: 240 10*3/uL (ref 150–400)
RBC: 3.36 MIL/uL — ABNORMAL LOW (ref 3.87–5.11)
RDW: 15.8 % — ABNORMAL HIGH (ref 11.5–15.5)
WBC: 9.4 10*3/uL (ref 4.0–10.5)

## 2017-04-02 LAB — BASIC METABOLIC PANEL
Anion gap: 13 (ref 5–15)
BUN: 34 mg/dL — AB (ref 6–20)
CHLORIDE: 99 mmol/L — AB (ref 101–111)
CO2: 25 mmol/L (ref 22–32)
CREATININE: 8.44 mg/dL — AB (ref 0.44–1.00)
Calcium: 8.8 mg/dL — ABNORMAL LOW (ref 8.9–10.3)
GFR calc Af Amer: 6 mL/min — ABNORMAL LOW (ref >60)
GFR calc non Af Amer: 5 mL/min — ABNORMAL LOW (ref >60)
Glucose, Bld: 84 mg/dL (ref 65–99)
POTASSIUM: 4.4 mmol/L (ref 3.5–5.1)
Sodium: 137 mmol/L (ref 135–145)

## 2017-04-02 LAB — PROTIME-INR
INR: 1.87
Prothrombin Time: 21.4 seconds — ABNORMAL HIGH (ref 11.4–15.2)

## 2017-04-02 LAB — HEPARIN LEVEL (UNFRACTIONATED): Heparin Unfractionated: 0.31 IU/mL (ref 0.30–0.70)

## 2017-04-02 MED ORDER — FERRIC CITRATE 1 GM 210 MG(FE) PO TABS: 420.0000 mg | ORAL_TABLET | ORAL | Status: DC

## 2017-04-02 MED ORDER — ACETAMINOPHEN 500 MG PO TABS: 500.0000 mg | ORAL_TABLET | Freq: Four times a day (QID) | ORAL | Status: DC | PRN

## 2017-04-02 MED ORDER — CALCITRIOL 0.25 MCG PO CAPS
0.2500 ug | ORAL_CAPSULE | ORAL | Status: DC
  Administered 2017-04-04: 0.25 ug via ORAL
  Filled 2017-04-02: qty 1

## 2017-04-02 MED ORDER — HYDROMORPHONE HCL 1 MG/ML IJ SOLN
1.0000 mg | Freq: Once | INTRAMUSCULAR | Status: AC
  Administered 2017-04-02: 1 mg via INTRAVENOUS
  Filled 2017-04-02: qty 1

## 2017-04-02 MED ORDER — HYDROMORPHONE HCL 1 MG/ML IJ SOLN
1.0000 mg | Freq: Once | INTRAMUSCULAR | Status: AC
  Administered 2017-04-02: 1 mg via INTRAMUSCULAR
  Filled 2017-04-02: qty 1

## 2017-04-02 MED ORDER — HYDROMORPHONE HCL 1 MG/ML IJ SOLN
1.0000 mg | INTRAMUSCULAR | Status: DC | PRN
  Administered 2017-04-02 – 2017-04-03 (×5): 1 mg via INTRAVENOUS
  Filled 2017-04-02 (×4): qty 1

## 2017-04-02 MED ORDER — FERRIC CITRATE 1 GM 210 MG(FE) PO TABS
630.0000 mg | ORAL_TABLET | ORAL | Status: DC
  Administered 2017-04-02: 630 mg via ORAL
  Filled 2017-04-02 (×3): qty 3

## 2017-04-02 MED ORDER — ONDANSETRON HCL 4 MG/2ML IJ SOLN
4.0000 mg | Freq: Once | INTRAMUSCULAR | Status: AC
  Administered 2017-04-02: 4 mg via INTRAVENOUS

## 2017-04-02 MED ORDER — LEVETIRACETAM 500 MG PO TABS
500.0000 mg | ORAL_TABLET | ORAL | Status: DC
  Administered 2017-04-02: 500 mg via ORAL
  Filled 2017-04-02 (×2): qty 1

## 2017-04-02 MED ORDER — ONDANSETRON HCL 4 MG PO TABS
4.0000 mg | ORAL_TABLET | Freq: Four times a day (QID) | ORAL | Status: DC | PRN
  Administered 2017-04-03: 4 mg via ORAL
  Filled 2017-04-02: qty 1

## 2017-04-02 MED ORDER — IOPAMIDOL (ISOVUE-370) INJECTION 76%
INTRAVENOUS | Status: AC
  Filled 2017-04-02: qty 100

## 2017-04-02 MED ORDER — IOPAMIDOL (ISOVUE-370) INJECTION 76%
INTRAVENOUS | Status: AC
Start: 2017-04-02 — End: 2017-04-02
  Administered 2017-04-02: 100 mL
  Filled 2017-04-02: qty 100

## 2017-04-02 MED ORDER — CALCIUM ACETATE (PHOS BINDER) 667 MG PO CAPS
2001.0000 mg | ORAL_CAPSULE | Freq: Three times a day (TID) | ORAL | Status: DC
  Administered 2017-04-02 – 2017-04-04 (×4): 2001 mg via ORAL
  Filled 2017-04-02 (×6): qty 3

## 2017-04-02 MED ORDER — LEVETIRACETAM 500 MG PO TABS
500.0000 mg | ORAL_TABLET | Freq: Two times a day (BID) | ORAL | Status: DC
  Administered 2017-04-02 – 2017-04-04 (×5): 500 mg via ORAL
  Filled 2017-04-02 (×4): qty 1

## 2017-04-02 MED ORDER — ONDANSETRON HCL 4 MG/2ML IJ SOLN
INTRAMUSCULAR | Status: AC
  Filled 2017-04-02: qty 2

## 2017-04-02 MED ORDER — WARFARIN - PHARMACIST DOSING INPATIENT
Freq: Every day | Status: DC
  Administered 2017-04-02 – 2017-04-03 (×2)

## 2017-04-02 MED ORDER — ACETAMINOPHEN 325 MG PO TABS
650.0000 mg | ORAL_TABLET | Freq: Four times a day (QID) | ORAL | Status: DC | PRN
  Filled 2017-04-02: qty 2

## 2017-04-02 MED ORDER — HEPARIN (PORCINE) IN NACL 100-0.45 UNIT/ML-% IJ SOLN
1200.0000 [IU]/h | INTRAMUSCULAR | Status: DC
  Administered 2017-04-02: 1200 [IU]/h via INTRAVENOUS
  Filled 2017-04-02 (×4): qty 250

## 2017-04-02 MED ORDER — NORTRIPTYLINE HCL 25 MG PO CAPS
50.0000 mg | ORAL_CAPSULE | Freq: Every day | ORAL | Status: DC
  Administered 2017-04-03: 50 mg via ORAL
  Filled 2017-04-02 (×3): qty 2

## 2017-04-02 MED ORDER — WARFARIN SODIUM 2.5 MG PO TABS
2.5000 mg | ORAL_TABLET | Freq: Once | ORAL | Status: AC
  Administered 2017-04-02: 2.5 mg via ORAL
  Filled 2017-04-02 (×2): qty 1

## 2017-04-02 MED ORDER — FERRIC CITRATE 1 GM 210 MG(FE) PO TABS
420.0000 mg | ORAL_TABLET | ORAL | Status: DC
  Filled 2017-04-02 (×2): qty 2

## 2017-04-02 MED ORDER — HYDROXYCHLOROQUINE SULFATE 200 MG PO TABS
200.0000 mg | ORAL_TABLET | Freq: Every day | ORAL | Status: DC
  Administered 2017-04-03 – 2017-04-04 (×2): 200 mg via ORAL
  Filled 2017-04-02 (×3): qty 1

## 2017-04-02 MED ORDER — ONDANSETRON HCL 4 MG/2ML IJ SOLN
4.0000 mg | Freq: Four times a day (QID) | INTRAMUSCULAR | Status: DC | PRN
  Administered 2017-04-02 – 2017-04-03 (×2): 4 mg via INTRAVENOUS
  Filled 2017-04-02 (×2): qty 2

## 2017-04-02 MED ORDER — ACETAMINOPHEN 650 MG RE SUPP: 650.0000 mg | Freq: Four times a day (QID) | RECTAL | Status: DC | PRN

## 2017-04-02 MED ORDER — HYDRALAZINE HCL 20 MG/ML IJ SOLN
INTRAMUSCULAR | Status: AC
  Filled 2017-04-02: qty 1

## 2017-04-02 MED ORDER — HYDROMORPHONE HCL 1 MG/ML IJ SOLN
INTRAMUSCULAR | Status: AC
  Administered 2017-04-02: 1 mg via INTRAVENOUS
  Filled 2017-04-02: qty 1

## 2017-04-02 NOTE — ED Notes (Signed)
Pt transported to vascular.  °

## 2017-04-02 NOTE — Consult Note (Addendum)
[facility] [place] NOTE   MRN : [id number]  Reason for Consult: left UE edema and pain s/p clotted brachial cephalic fistula Referring Physician: ED  History of Present Illness: [name] who came in the hospital on 03/13/17 after undergoing unsuccessful declot of the right BC AVF secondary to the large amount of occlusive thrombus in the aneurysmal segment of the outflow cephalic vein and technically successful balloon angioplasty of the central outflow stenosis.  A tunneled dialysis catheter was placed by IR .  She is on HD T-TH-Sat via left HD catheter.  2 days ago she has experienced increased swelling, firmness and pain in the right UE.  She has good sensation and can move her hand.   ROS No SOB, N/V no fever.  Previous access procedures: -Hx right radial cephalic AVF date unknown -[facility] placement 03/08/11 -ligation of right RC AVF & creation of right BC AVF 07/18/11 -thrombectomy of right BC AVF 10/10/11 -fistulogram right BC AVF and venoplasty of right axillary vein stenosis 12/08/11 -ligation of 3 competing branches of right BC AVF 01/23/12 -fistulogram right BC AVF and venoplasty of subclavian vein stenosis with 7 mm x 4 cm balloon and 9 mm x 4 cm balloon 04/12/12 -TDC placement (IR) 03/13/17  Past med. Hx: She does have a seizure disorder, hx lupus, hypertension, dialysis T/T/S and hx of CHF.  Most recent history of PE per CTA 03/14/2017 she was discharged on coumadin.  INR 1.87 today subtherapeutic.           Current Facility-Administered Medications  Medication Dose Route Frequency Provider Last Rate Last Dose  . acetaminophen (TYLENOL) tablet 650 mg  650 mg Oral Q6H PRN Etta Quill, DO       Or  . acetaminophen (TYLENOL) suppository 650 mg  650 mg Rectal Q6H PRN Etta Quill, DO      . calcitRIOL (ROCALTROL) capsule 0.25 mcg  0.25 mcg Oral Q T,Th,Sa-HD Valentina Gu, NP      . calcium acetate (PHOSLO) capsule  2,001 mg  2,001 mg Oral TID WC Etta Quill, DO   2,001 mg at 04/02/17 0825  . [START ON 04/03/2017] ferric citrate (AURYXIA) tablet 420 mg  420 mg Oral 3 times per day on Sun Mon Wed Fri Alcario Drought, Jared M, DO       And  . ferric citrate (AURYXIA) tablet 630 mg  630 mg Oral 3 times per day on Tue Thu Sat Etta Quill, DO   630 mg at 04/02/17 0825  . heparin ADULT infusion 100 units/mL (25000 units/259mL sodium chloride 0.45%)  1,200 Units/hr Intravenous Continuous Ward, Kristen N, DO 12 mL/hr at 04/02/17 0411 1,200 Units/hr at 04/02/17 0411  . HYDROmorphone (DILAUDID) injection 1 mg  1 mg Intravenous Q4H PRN Etta Quill, DO      . hydroxychloroquine (PLAQUENIL) tablet 200 mg  200 mg Oral Daily Alcario Drought, Jared M, DO      . levETIRAcetam (KEPPRA) tablet 500 mg  500 mg Oral BID Etta Quill, DO   Stopped at 04/02/17 0700  . levETIRAcetam (KEPPRA) tablet 500 mg  500 mg Oral Q T,Th,Sa-HD Janett Billow, RPH      . nortriptyline (PAMELOR) capsule 50 mg  50 mg Oral Daily Alcario Drought, Jared M, DO      . ondansetron [facility]) 4 MG/2ML injection           . ondansetron (ZOFRAN) tablet 4 mg  4  mg Oral Q6H PRN Etta Quill, DO       Or  . ondansetron [facility]) injection 4 mg  4 mg Intravenous Q6H PRN Etta Quill, DO   4 mg at 04/02/17 0825  . warfarin (COUMADIN) tablet 2.5 mg  2.5 mg Oral ONCE-1800 Etta Quill, DO      . Warfarin - Pharmacist Dosing Inpatient   Does not apply q1800 Etta Quill, DO       Current Outpatient Medications  Medication Sig Dispense Refill  . acetaminophen (TYLENOL) 500 MG tablet Take 500 mg by mouth every 6 (six) hours as needed for mild pain or moderate pain.    . calcium acetate (PHOSLO) 667 MG capsule Take 1,334-2,001 mg See admin instructions by mouth. 2,001 mg three times a day with meals and 1,334 mg with snacks    . ferric citrate (AURYXIA) 1 GM 210 MG(Fe) tablet Take 420-630 mg See admin instructions by mouth. 420 mg on Sun/Mon/Wed/Fri and  630 mg on Tues/Thurs/Sat    . hydroxychloroquine (PLAQUENIL) 200 MG tablet Take 1 tablet (200 mg total) by mouth daily. 30 tablet 0  . levETIRAcetam (KEPPRA) 500 MG tablet Take 500 mg See admin instructions by mouth. 500 mg two times a day and an additional 500 mg on Tues/Thurs/Sat after dialysis    . nortriptyline (PAMELOR) 50 MG capsule Take 50 mg by mouth daily.    Marland Kitchen warfarin (COUMADIN) 5 MG tablet Take 1 tablet (5 mg total) by mouth one time only at 6 PM. (Patient taking differently: Take 2.5 mg every evening by mouth. ) 30 tablet 0   Facility-Administered Medications Ordered in Other Encounters  Medication Dose Route Frequency Provider Last Rate Last Dose  . 0.9 %  sodium chloride infusion   Intravenous Continuous Monia Sabal, PA-C        Pt meds include: Statin :No Betablocker: No ASA: No Other anticoagulants/antiplatelets: coumadin / heparin  Past Medical History:  Diagnosis Date  . Anemia   . Blood transfusion   . Cervix carcinoma in situ Mar 2011  . CHF (congestive heart failure) ([place])   . Chronic kidney disease    T, Th, Sat  . Dialysis patient [facility])    tues, thursday, saturday.  . Gallstone pancreatitis Feb 2011  . Hypertension   . Lupus   . Lupus nephritis ([place])   . Pneumonia due to organism 03/02/2011  . Proteinuria - cause not known   . Renal failure, acute on chronic (HCC)   . Renal insufficiency   . Seizure disorder ([place]) 03/19/2013  . Thrombocytopenia ([place])     Past Surgical History:  Procedure Laterality Date  . APPENDECTOMY    . AV FISTULA PLACEMENT    . AV FISTULA PLACEMENT  07/18/2011   Procedure: ARTERIOVENOUS (AV) FISTULA CREATION;  Surgeon: Angelia Mould, MD;  Location: [place];  Service: Vascular;  Laterality: Right;  . AV FISTULA PLACEMENT  01/23/12   Revision of Right AVF  . CERVICAL CONE BIOPSY    . CESAREAN SECTION    . CHOLECYSTECTOMY    . EMBOLECTOMY  10/10/2011   Procedure: EMBOLECTOMY;  Surgeon: Angelia Mould, MD;   Location: [place];  Service: Vascular;  Laterality: Right;  . FISTULOGRAM Right 04/12/2012   Procedure: FISTULOGRAM;  Surgeon: Angelia Mould, MD;  Location[facility] C[facility] LAB;  Service: Cardiovascular;  Laterality: Right;  . IR FLUORO GUIDE CV LINE LEFT  03/13/2017  . IR THROMBECTOMY AV FISTULA W/THROMBOLYSIS/PTA INC/SHUNT/IMG RIGHT  Right 03/13/2017  . IR US GUIDE VASC ACCESS LEFT  03/13/2017  . IR US GUIDE VASC ACCESS RIGHT  03/13/2017  . SHUNTOGRAM Right 07/07/2011   Procedure: SHUNTOGRAM;  Surgeon: Angelia Mould, MD;  [facility];  Service: Cardiovascular;  Laterality: Right;  . SHUNTOGRAM N/A 12/08/2011   Procedure: [place];  Surgeon: Angelia Mould, MD;  Location: [facility] CATH LAB;  Service: Cardiovascular;  Laterality: N/A;    Social History Social History   Tobacco Use  . Smoking status: Never Smoker  . Smokeless tobacco: Never Used  Substance Use Topics  . Alcohol use: No  . Drug use: No    Family History Family History  Problem Relation Age of Onset  . Diabetes Mother   . Diabetes Brother   . Diabetes Brother   . Diabetes Maternal Grandmother   . Anesthesia problems Neg Hx     No Known Allergies   REVIEW OF SYSTEMS  General: [ ]  Weight loss, [ ]  Fever, [ ]  chills Neurologic: [ ]  Dizziness, [ ]  Blackouts, [ ]  Seizure [ ]  Stroke, [ ]  "Mini stroke", [ ]  Slurred speech, [ ]  Temporary blindness; [ ]  weakness in arms or legs, [ ]  Hoarseness [ ]  Dysphagia Cardiac: [ ]  Chest pain/pressure, [ ]  Shortness of breath at rest [ ]  Shortness of breath with exertion, [ ]  Atrial fibrillation or irregular heartbeat  Vascular: [ ]  Pain in legs with walking, [ ]  Pain in legs at rest, [ ]  Pain in legs at night,  [ ]  Non-healing ulcer, [x ] Blood clot in vein/DVT,   Pulmonary: [ ]  Home oxygen, [ ]  Productive cough, [ ]  Coughing up blood, [ ]  Asthma,  [ ]  Wheezing [ ]  COPD Musculoskeletal:  [ ]  Arthritis, [ ]  Low back pain, [ ]  Joint pain Hematologic: [ ]  Easy  Bruising, [ ]  Anemia; [ ]  Hepatitis Gastrointestinal: [ ]  Blood in stool, [ ]  Gastroesophageal Reflux/heartburn, Urinary: [ ]  chronic Kidney disease, [x ] on HD - [ ]  MWF or [ x] TTHS, [ ]  Burning with urination, [ ]  Difficulty urinating Skin: [ ]  Rashes, [ ]  Wounds Psychological: [ ]  Anxiety, [ ]  Depression  Physical Examination Vitals:   04/02/17 0745 04/02/17 0800 04/02/17 0830 04/02/17 0915  BP: (!) 162/111 (!) 170/124    Pulse: 82 86 93 86  Resp: 13 16 17 14   Temp:      TempSrc:      SpO2: 100% 99% 100% 100%  Weight:      Height:       Body mass index is 26.15 kg/m.  General:  WDWN in NAD Gait: Normal HENT: WNL Eyes: Pupils equal Pulmonary: normal non-labored breathing , without Rales, rhonchi,  wheezing Cardiac: RRR, without  Murmurs, rubs or gallops; No carotid bruits Abdomen: soft, NT, no masses Skin: right UE edema/hematoma ecchymosis, small 76mm wound anti cubital area. Vascular Exam/Pulses:Palpable radial, DP B    Musculoskeletal: no muscle wasting or atrophy;  Neurologic: A&O X 3; Appropriate Affect ;  SENSATION: normal; MOTOR FUNCTION: grossly intact all 4 ext.  Right UE decreased mtor due to pain.  Speech is fluent/normal   Significant Diagnostic Studies: CBC Lab Results  Component Value Date   WBC 9.4 04/02/2017   HGB 9.8 (L) 04/02/2017   HCT 31.6 (L) 04/02/2017   MCV 94.0 04/02/2017   PLT 240 04/02/2017    BMET    Component Value Date/Time   NA 137 04/02/2017 0031   K  4.4 04/02/2017 0031   CL 99 (L) 04/02/2017 0031   CO2 25 04/02/2017 0031   GLUCOSE 84 04/02/2017 0031   BUN 34 (H) 04/02/2017 0031   CREATININE 8.44 (H) 04/02/2017 0031   CALCIUM 8.8 (L) 04/02/2017 0031   CALCIUM 7.9 (L) 03/06/2011 1200   GFRNONAA 5 (L) 04/02/2017 0031   GFRAA 6 (L) 04/02/2017 0031   Estimated Creatinine Clearance: 9.2 mL/min (A) (by C-G formula based on SCr of 8.44 mg/dL (H)).  COAG Lab Results  Component Value Date   INR 1.87 04/02/2017   INR 2.1  03/30/2017   INR 5.3 03/27/2017     Non-Invasive Vascular Imaging: Venous duplex pending  ASSESSMENT/PLAN:  ESRD HD left TDC placed by IR Thrombosed right UE fistula since 03/13/2017 Thrombophlebitis  on coumadin with Hx of PE On Heparin now and being admitted by Dr. Eliseo Squires  May benefit from moist heat and Ancef IV.  I will order a right UE CTA.    Laurence Slate [facility] 04/02/2017 10:03 AM  I have independently interviewed and examined patient and agree with PA assessment and plan above. Will get CTA of right arm to evaluate. Continue IV heparin. NPO for now.   Suad Autrey C. Donzetta Matters, MD [facility] [place] Office: [phone] Pager: [phone]   Addendum: CT reviewed. she has a partially thrombosed avf with arterial flow in tact to hand. Will hopefully be able to avoid an operation. Can have warm compresses to right arm and ok for anticoagulation at this time.   Servando Snare

## 2017-04-02 NOTE — H&P (Signed)
History and Physical    Maria Vazquez FBP:[id number] DOB: 04/07/1976 DOA: 04/01/2017  PCP: Estanislado Emms, MD  Patient coming from: Home  I have personally briefly reviewed patient's old medical records in [place]  Chief Complaint: R arm pain  HPI: Maria Vazquez is a 41 y.o. 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  Seizure disorder.  Patient presents to the ED with c/o worsening R upper arm pain and swelling over the past 24 hours.  Patient was diagnosed with occlusive thrombus of her RUE fistula at the end of October.  Was admitted for PE and fistula clot from 10/26 to 11/1.  Declot attempt PTA unsuccessful.  Got tunneled left chest catheter placed for access that admit.  Started on coumadin.  Over past 24 hours has had worsening swelling, pain of RUE fistula.  No injury to arm.  Numbness and tingling over fistula site.  Some tingling in all 5 fingertips.  No fever.  No CP no SOB.   ED Course: Pain controled with ICE, dilaudid.  Coumadin noted to be subtheraputic.  EDP wants admit for pain control.  Does not feel that compartment syndrome is present.   Review of Systems: As per HPI otherwise 10 point review of systems negative.   Past Medical History:  Diagnosis Date  . Anemia   . Blood transfusion   . Cervix carcinoma in situ Mar 2011  . CHF (congestive heart failure) ([place])   . Chronic kidney disease    T, Th, Sat  . Dialysis patient [facility])    tues, thursday, saturday.  . Gallstone pancreatitis Feb 2011  . Hypertension   . Lupus   . Lupus nephritis ([place])   . Pneumonia due to organism 03/02/2011  . Proteinuria - cause not known   . Renal failure, acute on chronic (HCC)   . Renal insufficiency   . Seizure disorder ([place]) 03/19/2013  . Thrombocytopenia ([place])     Past Surgical History:  Procedure Laterality Date  . APPENDECTOMY    . AV FISTULA PLACEMENT    . AV FISTULA PLACEMENT  07/18/2011   Procedure: ARTERIOVENOUS (AV) FISTULA CREATION;  Surgeon: Angelia Mould, MD;  Location: [place];  Service: Vascular;  Laterality: Right;  . AV FISTULA PLACEMENT  01/23/12   Revision of Right AVF  . CERVICAL CONE BIOPSY    . CESAREAN SECTION    . CHOLECYSTECTOMY    . EMBOLECTOMY  10/10/2011   Procedure: EMBOLECTOMY;  Surgeon: Angelia Mould, MD;  Location: [place];  Service: Vascular;  Laterality: Right;  . FISTULOGRAM Right 04/12/2012   Procedure: FISTULOGRAM;  Surgeon: Angelia Mould, MD;  Location: [facility] CATH LAB;  Service: Cardiovascular;  Laterality: Right;  . IR FLUORO GUIDE CV LINE LEFT  03/13/2017  . IR THROMBECTOMY AV FISTULA W/THROMBOLYSIS/PTA INC/SHUNT/IMG RIGHT Right 03/13/2017  . IR US GUIDE VASC ACCESS LEFT  03/13/2017  . IR US GUIDE VASC ACCESS RIGHT  03/13/2017  . SHUNTOGRAM Right 07/07/2011   Procedure: SHUNTOGRAM;  Surgeon: Angelia Mould, MD;  Location: [facility] CATH LAB;  Service: Cardiovascular;  Laterality: Right;  . SHUNTOGRAM N/A 12/08/2011   Procedure: Earney Mallet;  Surgeon: Angelia Mould, MD;  Location: [facility] CATH LAB;  Service: Cardiovascular;  Laterality: N/A;     reports that  has never smoked. she has never used smokeless tobacco. She reports that she does not drink alcohol or use drugs.  No Known Allergies  Family History  Problem Relation Age of Onset  . Diabetes Mother   . Diabetes Brother   . Diabetes Brother   . Diabetes Maternal Grandmother   . Anesthesia problems Neg Hx      Prior to Admission medications   Medication Sig Start Date End Date Taking? Authorizing Provider  acetaminophen (TYLENOL) 500 MG tablet Take 500 mg by mouth every 6 (six) hours as needed for mild pain or moderate pain.   Yes [provider]  calcium acetate (PHOSLO) 667 MG capsule Take 1,334-2,001 mg See admin instructions by mouth. 2,001 mg three times a day with meals and 1,334 mg with snacks   Yes [provider]  ferric citrate  (AURYXIA) 1 GM 210 MG(Fe) tablet Take 420-630 mg See admin instructions by mouth. 420 mg on Sun/Mon/Wed/Fri and 630 mg on Tues/Thurs/Sat   Yes [provider]  hydroxychloroquine (PLAQUENIL) 200 MG tablet Take 1 tablet (200 mg total) by mouth daily. 01/13/15  Yes Kathie Dike, MD  levETIRAcetam (KEPPRA) 500 MG tablet Take 500 mg See admin instructions by mouth. 500 mg two times a day and an additional 500 mg on Tues/Thurs/Sat after dialysis   Yes [provider]  nortriptyline (PAMELOR) 50 MG capsule Take 50 mg by mouth daily.   Yes [provider]  warfarin (COUMADIN) 5 MG tablet Take 1 tablet (5 mg total) by mouth one time only at 6 PM. Patient taking differently: Take 2.5 mg every evening by mouth.  03/19/17  Yes Cristal Ford, DO    Physical Exam: Vitals:   04/02/17 0200 04/02/17 0215 04/02/17 0300 04/02/17 0325  BP: (!) 154/102 (!) 145/108 (!) 161/108 (!) 162/104  Pulse: 85 91 93 100  Resp:      Temp:      TempSrc:      SpO2: 99% 97% (!) 87% 100%  Weight:      Height:        Constitutional: NAD, calm, comfortable Eyes: PERRL, lids and conjunctivae normal ENMT: Mucous membranes are moist. Posterior pharynx clear of any exudate or lesions.Normal dentition.  Neck: normal, supple, no masses, no thyromegaly Respiratory: clear to auscultation bilaterally, no wheezing, no crackles. Normal respiratory effort. No accessory muscle use.  Cardiovascular: Regular rate and rhythm, no murmurs / rubs / gallops. No extremity edema. 2+ pedal pulses. No carotid bruits.  Abdomen: no tenderness, no masses palpated. No hepatosplenomegaly. Bowel sounds positive.  Musculoskeletal: RUE all compartments soft except area directly around the thrombosed fistula which is firm.  Remainder of the anterior upper compartment is soft however.  Bruising over fistula and slight amount on forearm but forearm area is soft.  Radial pulse is palpable. Skin: no rashes, lesions, ulcers. No  induration Neurologic: CN 2-12 grossly intact. Sensation intact, DTR normal. Strength 5/5 in all 4.  Psychiatric: Normal judgment and insight. Alert and oriented x 3. Normal mood.    Labs on Admission: I have personally reviewed following labs and imaging studies  CBC: Recent Labs  Lab 04/02/17 0031  WBC 9.4  NEUTROABS 6.1  HGB 9.8*  HCT 31.6*  MCV 94.0  PLT 503   Basic Metabolic Panel: Recent Labs  Lab 04/02/17 0031  NA 137  K 4.4  CL 99*  CO2 25  GLUCOSE 84  BUN 34*  CREATININE 8.44*  CALCIUM 8.8*   GFR: Estimated Creatinine Clearance: 9.2 mL/min (A) (by C-G formula based on SCr of 8.44 mg/dL (H)). Liver Function Tests: No results for input(s): AST, ALT, ALKPHOS, BILITOT, PROT, ALBUMIN  in the last 168 hours. No results for input(s): LIPASE, AMYLASE in the last 168 hours. No results for input(s): AMMONIA in the last 168 hours. Coagulation Profile: Recent Labs  Lab 03/27/17 1114 03/27/17 1632 03/30/17 1407 04/02/17 0050  INR 5.31* 5.3 2.1 1.87   Cardiac Enzymes: No results for input(s): CKTOTAL, CKMB, CKMBINDEX, TROPONINI in the last 168 hours. BNP (last 3 results) No results for input(s): PROBNP in the last 8760 hours. HbA1C: No results for input(s): HGBA1C in the last 72 hours. CBG: No results for input(s): GLUCAP in the last 168 hours. Lipid Profile: No results for input(s): CHOL, HDL, LDLCALC, TRIG, CHOLHDL, LDLDIRECT in the last 72 hours. Thyroid Function Tests: No results for input(s): TSH, T4TOTAL, FREET4, T3FREE, THYROIDAB in the last 72 hours. Anemia Panel: No results for input(s): VITAMINB12, FOLATE, FERRITIN, TIBC, IRON, RETICCTPCT in the last 72 hours. Urine analysis:    Component Value Date/Time   COLORURINE YELLOW 03/02/2011 0948   APPEARANCEUR CLOUDY (A) 03/02/2011 0948   LABSPEC >1.030 (H) 03/02/2011 0948   PHURINE 6.5 03/02/2011 0948   GLUCOSEU NEGATIVE 03/02/2011 0948   HGBUR LARGE (A) 03/02/2011 0948   BILIRUBINUR NEGATIVE  03/02/2011 0948   KETONESUR NEGATIVE 03/02/2011 0948   PROTEINUR >300 (A) 03/02/2011 0948   UROBILINOGEN 0.2 03/02/2011 0948   NITRITE NEGATIVE 03/02/2011 0948   LEUKOCYTESUR NEGATIVE 03/02/2011 0948    Radiological Exams on Admission: No results found.  EKG: Independently reviewed.  Assessment/Plan Principal Problem:   Clotted dialysis access [facility]) Active Problems:   ESRD (end stage renal disease) on dialysis ([place])   Bilateral pulmonary embolism ([place])    1. Clotted dialysis access - 1. Doesn't appear to be compartment syndrome at this point but certainly a concerning amount of swelling. 2. Palpable radial pulse 3. Heparin per pharm due to subtheraputic coumadin 4. Coumadin per pharm 5. Vascular US RUE to look for clot extension (which I suspect is present) 6. Call vascular surgery in AM 2. ESRD - left message with nephrology for routine dialysis 3. Bilateral PE - 1. Heparin bridge to coumadin 2. No vital sign or symptomatic evidence of worsening with respect to PE  DVT prophylaxis: Heparin gtt, bridge to coumadin Code Status: Full Family Communication: No family in room Disposition Plan: Home after admit Consults called: left message with nephrology for IP dialysis Admission status: Place in obs   [place], [place] [facility] Pager [phone]  If 7AM-7PM, please contact day team taking care of patient [email or web address] Password [facility]  04/02/2017, 3:39 AM

## 2017-04-02 NOTE — Progress Notes (Signed)
Admission note:  Arrival Method: via stretcher from HD Mental Orientation: Alert and oriented Telemetry: box #3, CCMD notified.  Assessment: see doc flow sheet Skin: warm, dry and intact. IV: L AC NSL, L FA with heparin gtt at 44ml/hr. Pain: 1-10 Safety Measures: discussed and reviewed with pt, verbalized understanding. Call bell and telephone within reach.  Admission Screening: in progress 5100 Orientation: Patient has been oriented to the unit, staff and to the room.

## 2017-04-02 NOTE — H&P (View-Only) (Signed)
[facility] NOTE   MRN : [id number]  Reason for Consult: left UE edema and pain s/p clotted brachial cephalic fistula Referring Physician: ED  History of Present Illness: [name] who came in the hospital on 03/13/17 after undergoing unsuccessful declot of the right BC AVF secondary to the large amount of occlusive thrombus in the aneurysmal segment of the outflow cephalic vein and technically successful balloon angioplasty of the central outflow stenosis.  A tunneled dialysis catheter was placed by IR .  She is on HD T-TH-Sat via left HD catheter.  2 days ago she has experienced increased swelling, firmness and pain in the right UE.  She has good sensation and can move her hand.   ROS No SOB, N/V no fever.  Previous access procedures: -Hx right radial cephalic AVF date unknown -[facility] placement 03/08/11 -ligation of right RC AVF & creation of right BC AVF 07/18/11 -thrombectomy of right BC AVF 10/10/11 -fistulogram right BC AVF and venoplasty of right axillary vein stenosis 12/08/11 -ligation of 3 competing branches of right BC AVF 01/23/12 -fistulogram right BC AVF and venoplasty of subclavian vein stenosis with 7 mm x 4 cm balloon and 9 mm x 4 cm balloon 04/12/12 -TDC placement (IR) 03/13/17  Past med. Hx: She does have a seizure disorder, hx lupus, hypertension, dialysis T/T/S and hx of CHF.  Most recent history of PE per CTA 03/14/2017 she was discharged on coumadin.  INR 1.87 today subtherapeutic.           Current Facility-Administered Medications  Medication Dose Route Frequency Provider Last Rate Last Dose  . acetaminophen (TYLENOL) tablet 650 mg  650 mg Oral Q6H PRN Etta Quill, DO       Or  . acetaminophen (TYLENOL) suppository 650 mg  650 mg Rectal Q6H PRN Etta Quill, DO      . calcitRIOL (ROCALTROL) capsule 0.25 mcg  0.25 mcg Oral Q T,Th,Sa-HD Valentina Gu, NP      . calcium acetate (PHOSLO) capsule  2,001 mg  2,001 mg Oral TID WC Etta Quill, DO   2,001 mg at 04/02/17 0825  . [START ON 04/03/2017] ferric citrate (AURYXIA) tablet 420 mg  420 mg Oral 3 times per day on Sun Mon Wed Fri Alcario Drought, Jared M, DO       And  . ferric citrate (AURYXIA) tablet 630 mg  630 mg Oral 3 times per day on Tue Thu Sat Etta Quill, DO   630 mg at 04/02/17 0825  . heparin ADULT infusion 100 units/mL (25000 units/281mL sodium chloride 0.45%)  1,200 Units/hr Intravenous Continuous Ward, Kristen N, DO 12 mL/hr at 04/02/17 0411 1,200 Units/hr at 04/02/17 0411  . HYDROmorphone (DILAUDID) injection 1 mg  1 mg Intravenous Q4H PRN Etta Quill, DO      . hydroxychloroquine (PLAQUENIL) tablet 200 mg  200 mg Oral Daily Alcario Drought, Jared M, DO      . levETIRAcetam (KEPPRA) tablet 500 mg  500 mg Oral BID Etta Quill, DO   Stopped at 04/02/17 0700  . levETIRAcetam (KEPPRA) tablet 500 mg  500 mg Oral Q T,Th,Sa-HD Janett Billow, RPH      . nortriptyline (PAMELOR) capsule 50 mg  50 mg Oral Daily Alcario Drought, Jared M, DO      . ondansetron [facility]) 4 MG/2ML injection           . ondansetron (ZOFRAN) tablet 4 mg  4  mg Oral Q6H PRN Etta Quill, DO       Or  . ondansetron [facility]) injection 4 mg  4 mg Intravenous Q6H PRN Etta Quill, DO   4 mg at 04/02/17 0825  . warfarin (COUMADIN) tablet 2.5 mg  2.5 mg Oral ONCE-1800 Etta Quill, DO      . Warfarin - Pharmacist Dosing Inpatient   Does not apply q1800 Etta Quill, DO       Current Outpatient Medications  Medication Sig Dispense Refill  . acetaminophen (TYLENOL) 500 MG tablet Take 500 mg by mouth every 6 (six) hours as needed for mild pain or moderate pain.    . calcium acetate (PHOSLO) 667 MG capsule Take 1,334-2,001 mg See admin instructions by mouth. 2,001 mg three times a day with meals and 1,334 mg with snacks    . ferric citrate (AURYXIA) 1 GM 210 MG(Fe) tablet Take 420-630 mg See admin instructions by mouth. 420 mg on Sun/Mon/Wed/Fri and  630 mg on Tues/Thurs/Sat    . hydroxychloroquine (PLAQUENIL) 200 MG tablet Take 1 tablet (200 mg total) by mouth daily. 30 tablet 0  . levETIRAcetam (KEPPRA) 500 MG tablet Take 500 mg See admin instructions by mouth. 500 mg two times a day and an additional 500 mg on Tues/Thurs/Sat after dialysis    . nortriptyline (PAMELOR) 50 MG capsule Take 50 mg by mouth daily.    Marland Kitchen warfarin (COUMADIN) 5 MG tablet Take 1 tablet (5 mg total) by mouth one time only at 6 PM. (Patient taking differently: Take 2.5 mg every evening by mouth. ) 30 tablet 0   Facility-Administered Medications Ordered in Other Encounters  Medication Dose Route Frequency Provider Last Rate Last Dose  . 0.9 %  sodium chloride infusion   Intravenous Continuous Monia Sabal, PA-C        Pt meds include: Statin :No Betablocker: No ASA: No Other anticoagulants/antiplatelets: coumadin / heparin  Past Medical History:  Diagnosis Date  . Anemia   . Blood transfusion   . Cervix carcinoma in situ Mar 2011  . CHF (congestive heart failure) ([place])   . Chronic kidney disease    T, Th, Sat  . Dialysis patient [facility])    tues, thursday, saturday.  . Gallstone pancreatitis Feb 2011  . Hypertension   . Lupus   . Lupus nephritis ([place])   . Pneumonia due to organism 03/02/2011  . Proteinuria - cause not known   . Renal failure, acute on chronic (HCC)   . Renal insufficiency   . Seizure disorder ([place]) 03/19/2013  . Thrombocytopenia ([place])     Past Surgical History:  Procedure Laterality Date  . APPENDECTOMY    . AV FISTULA PLACEMENT    . AV FISTULA PLACEMENT  07/18/2011   Procedure: ARTERIOVENOUS (AV) FISTULA CREATION;  Surgeon: Angelia Mould, MD;  Location: [place];  Service: Vascular;  Laterality: Right;  . AV FISTULA PLACEMENT  01/23/12   Revision of Right AVF  . CERVICAL CONE BIOPSY    . CESAREAN SECTION    . CHOLECYSTECTOMY    . EMBOLECTOMY  10/10/2011   Procedure: EMBOLECTOMY;  Surgeon: Angelia Mould, MD;   Location: [place];  Service: Vascular;  Laterality: Right;  . FISTULOGRAM Right 04/12/2012   Procedure: FISTULOGRAM;  Surgeon: Angelia Mould, MD;  [facility] CATH LAB;  Service: Cardiovascular;  Laterality: Right;  . IR FLUORO GUIDE CV LINE LEFT  03/13/2017  . IR THROMBECTOMY AV FISTULA W/THROMBOLYSIS/PTA INC/SHUNT/IMG RIGHT  Right 03/13/2017  . IR US GUIDE VASC ACCESS LEFT  03/13/2017  . IR US GUIDE VASC ACCESS RIGHT  03/13/2017  . SHUNTOGRAM Right 07/07/2011   Procedure: SHUNTOGRAM;  Surgeon: Angelia Mould, MD;  Location: [facility] C[facility];  Service: Cardiovascular;  Laterality: Right;  . SHUNTOGRAM N/A 12/08/2011   Procedure: Earney Mallet;  Surgeon: Angelia Mould, MD;  Location: [facility] CATH LAB;  Service: Cardiovascular;  Laterality: N/A;    Social History Social History   Tobacco Use  . Smoking status: Never Smoker  . Smokeless tobacco: Never Used  Substance Use Topics  . Alcohol use: No  . Drug use: No    Family History Family History  Problem Relation Age of Onset  . Diabetes Mother   . Diabetes Brother   . Diabetes Brother   . Diabetes Maternal Grandmother   . Anesthesia problems Neg Hx     No Known Allergies   REVIEW OF SYSTEMS  General: [ ]  Weight loss, [ ]  Fever, [ ]  chills Neurologic: [ ]  Dizziness, [ ]  Blackouts, [ ]  Seizure [ ]  Stroke, [ ]  "Mini stroke", [ ]  Slurred speech, [ ]  Temporary blindness; [ ]  weakness in arms or legs, [ ]  Hoarseness [ ]  Dysphagia Cardiac: [ ]  Chest pain/pressure, [ ]  Shortness of breath at rest [ ]  Shortness of breath with exertion, [ ]  Atrial fibrillation or irregular heartbeat  Vascular: [ ]  Pain in legs with walking, [ ]  Pain in legs at rest, [ ]  Pain in legs at night,  [ ]  Non-healing ulcer, [x ] Blood clot in vein/DVT,   Pulmonary: [ ]  Home oxygen, [ ]  Productive cough, [ ]  Coughing up blood, [ ]  Asthma,  [ ]  Wheezing [ ]  COPD Musculoskeletal:  [ ]  Arthritis, [ ]  Low back pain, [ ]  Joint pain Hematologic: [ ]  Easy  Bruising, [ ]  Anemia; [ ]  Hepatitis Gastrointestinal: [ ]  Blood in stool, [ ]  Gastroesophageal Reflux/heartburn, Urinary: [ ]  chronic Kidney disease, [x ] on HD - [ ]  MWF or [ x] TTHS, [ ]  Burning with urination, [ ]  Difficulty urinating Skin: [ ]  Rashes, [ ]  Wounds Psychological: [ ]  Anxiety, [ ]  Depression  Physical Examination Vitals:   04/02/17 0745 04/02/17 0800 04/02/17 0830 04/02/17 0915  BP: (!) 162/111 (!) 170/124    Pulse: 82 86 93 86  Resp: 13 16 17 14   Temp:      TempSrc:      SpO2: 100% 99% 100% 100%  Weight:      Height:       Body mass index is 26.15 kg/m.  General:  WDWN in NAD Gait: Normal HENT: WNL Eyes: Pupils equal Pulmonary: normal non-labored breathing , without Rales, rhonchi,  wheezing Cardiac: RRR, without  Murmurs, rubs or gallops; No carotid bruits Abdomen: soft, NT, no masses Skin: right UE edema/hematoma ecchymosis, small 8mm wound anti cubital area. Vascular Exam/Pulses:Palpable radial, DP B    Musculoskeletal: no muscle wasting or atrophy;  Neurologic: A&O X 3; Appropriate Affect ;  SENSATION: normal; MOTOR FUNCTION: grossly intact all 4 ext.  Right UE decreased mtor due to pain.  Speech is fluent/normal   Significant Diagnostic Studies: CBC Lab Results  Component Value Date   WBC 9.4 04/02/2017   HGB 9.8 (L) 04/02/2017   HCT 31.6 (L) 04/02/2017   MCV 94.0 04/02/2017   PLT 240 04/02/2017    BMET    Component Value Date/Time   NA 137 04/02/2017 0031   K  4.4 04/02/2017 0031   CL 99 (L) 04/02/2017 0031   CO2 25 04/02/2017 0031   GLUCOSE 84 04/02/2017 0031   BUN 34 (H) 04/02/2017 0031   CREATININE 8.44 (H) 04/02/2017 0031   CALCIUM 8.8 (L) 04/02/2017 0031   CALCIUM 7.9 (L) 03/06/2011 1200   GFRNONAA 5 (L) 04/02/2017 0031   GFRAA 6 (L) 04/02/2017 0031   Estimated Creatinine Clearance: 9.2 mL/min (A) (by C-G formula based on SCr of 8.44 mg/dL (H)).  COAG Lab Results  Component Value Date   INR 1.87 04/02/2017   INR 2.1  03/30/2017   INR 5.3 03/27/2017     Non-Invasive Vascular Imaging: Venous duplex pending  ASSESSMENT/PLAN:  ESRD HD left TDC placed by IR Thrombosed right UE fistula since 03/13/2017 Thrombophlebitis  on coumadin with Hx of PE On Heparin now and being admitted by Dr. Eliseo Squires  May benefit from moist heat and Ancef IV.  I will order a right UE CTA.    Maria Vazquez 04/02/2017 10:03 AM  I have independently interviewed and examined patient and agree with PA assessment and plan above. Will get CTA of right arm to evaluate. Continue IV heparin. NPO for now.   [facility] [place] C. Donzetta Matters, MD [facility] [place] Office: [phone] Pager: [phone]   Addendum: CT reviewed. she has a partially thrombosed avf with arterial flow in tact to hand. Will hopefully be able to avoid an operation. Can have warm compresses to right arm and ok for anticoagulation at this time.   Maria Vazquez

## 2017-04-02 NOTE — Progress Notes (Signed)
Patient admitted after midnight, please see H&P.  Patient with increase in RUE blood clot in light of subtherapeutic INR--- last normal was 3 days ago (11/12) at 2.1, now 1.87.  Heparin gtt started and vascular surgery consult placed.  IV pain medications.  Nephrology to see for HD t/th/sat  Maria Bear DO

## 2017-04-02 NOTE — ED Provider Notes (Signed)
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  Patient had a right brachiocephalic fistula placed by Dr. Scot Dock in March 2013.  She had embolectomy in May 2013 and then subsequent revision in September 2013.  She developed a clot in this fistula and was unable to have dialysis on October 25.  Interventional radiology attempted a thrombectomy on October 26 which was unsuccessful.  They stated that she had a large thrombus in the aneurysmal segment of the outflow of the cephalic vein.  She was placed on Coumadin and had a tunneled left permacath placed by IR.  She was instructed to follow-up with vascular surgery but has not done so yet.  She states that she has had increased pain, swelling and bruising for the past 24 hours.  No injury to this arm.  Reports that over her fistula site it is numb and tingly and she has some tingling in all 5 fingertips.  No fever.  No chest pain or shortness of breath.  Pain radiates up into her shoulder.  Described as sharp, severe.  Last dialysis was November 13.  ROS: See HPI Constitutional: no fever  Eyes: no drainage  ENT: no runny nose   Cardiovascular:  no chest pain  Resp: no SOB  GI: no vomiting GU: no dysuria Integumentary: no rash  Allergy: no hives  Musculoskeletal: no leg swelling  Neurological: no slurred speech ROS otherwise negative  PAST MEDICAL HISTORY/PAST SURGICAL HISTORY:  Past Medical History:  Diagnosis Date  . Anemia   . Blood transfusion   . Cervix carcinoma in situ Mar 2011  . CHF (congestive heart failure) ([place])   . Chronic kidney disease    T, Th, Sat  . Dialysis patient [facility])    tues, thursday, saturday.  . Gallstone pancreatitis Feb 2011  . Hypertension   . Lupus   . Lupus  nephritis ([place])   . Pneumonia due to organism 03/02/2011  . Proteinuria - cause not known   . Renal failure, acute on chronic (HCC)   . Renal insufficiency   . Seizure disorder ([place]) 03/19/2013  . Thrombocytopenia ([place])     MEDICATIONS:  Prior to Admission medications   Medication Sig Start Date End Date Taking? Authorizing Provider  acetaminophen (TYLENOL) 500 MG tablet Take 500 mg by mouth every 6 (six) hours as needed for mild pain or moderate pain.   Yes [provider]  calcium acetate (PHOSLO) 667 MG capsule Take 1,334-2,001 mg See admin instructions by mouth. 2,001 mg three times a day with meals and 1,334 mg with snacks   Yes [provider]  ferric citrate (AURYXIA) 1 GM 210 MG(Fe) tablet Take 420-630 mg See admin instructions by mouth. 420 mg on Sun/Mon/Wed/Fri and 630 mg on Tues/Thurs/Sat   Yes [provider]  hydroxychloroquine (PLAQUENIL) 200 MG tablet Take 1 tablet (200 mg total) by mouth daily. 01/13/15  Yes Kathie Dike, MD  levETIRAcetam (KEPPRA) 500 MG tablet Take 500 mg See admin instructions by mouth. 500 mg two times a day and an additional 500 mg on Tues/Thurs/Sat after dialysis   Yes [provider]  nortriptyline (PAMELOR) 50 MG capsule Take 50 mg by mouth daily.   Yes [provider]  warfarin (COUMADIN) 5 MG tablet Take 1 tablet (  5 mg total) by mouth one time only at 6 PM. Patient taking differently: Take 2.5 mg every evening by mouth.  03/19/17  Yes Mikhail, Velta Addison, DO    ALLERGIES:  No Known Allergies  SOCIAL HISTORY:  Social History   Tobacco Use  . Smoking status: Never Smoker  . Smokeless tobacco: Never Used  Substance Use Topics  . Alcohol use: No    FAMILY HISTORY: Family History  Problem Relation Age of Onset  . Diabetes Mother   . Diabetes Brother   . Diabetes Brother   . Diabetes Maternal Grandmother   . Anesthesia problems Neg Hx     EXAM: BP (!) 164/109 (BP Location: Left Arm)   Pulse  75   Temp 98.7 F (37.1 C) (Oral)   Resp 16   Ht 5\' 6"  (1.676 m)   Wt 73.5 kg (162 lb)   SpO2 99%   BMI 26.15 kg/m  CONSTITUTIONAL: Alert and oriented and responds appropriately to questions.  Appears uncomfortable, tearful, afebrile HEAD: Normocephalic EYES: Conjunctivae clear, pupils appear equal, EOMI ENT: normal nose; moist mucous membranes NECK: Supple, no meningismus, no nuchal rigidity, no LAD  CARD: RRR; S1 and S2 appreciated; no murmurs, no clicks, no rubs, no gallops, patient has a tunneled catheter in the left chest wall without surrounding erythema, warmth or tenderness.  There is no drainage noted. RESP: Normal chest excursion without splinting or tachypnea; breath sounds clear and equal bilaterally; no wheezes, no rhonchi, no rales, no hypoxia or respiratory distress, speaking full sentences ABD/GI: Normal bowel sounds; non-distended; soft, non-tender, no rebound, no guarding, no peritoneal signs, no hepatosplenomegaly BACK:  The back appears normal and is non-tender to palpation, there is no CVA tenderness EXT: Patient has an AV fistula in the right upper extremity with swelling and is tight over the anterior portion of this arm but her compartments are otherwise soft.  There is ecchymosis diffusely noted throughout her arm.  She has 2+ palpable radial and ulnar pulse.  Normal grip strength in the right hand.  Normal sensation throughout the entire right arm.  No obvious bony deformity seen.  No redness, warmth, induration.  No joint effusion.  Normal ROM in all joints; otherwise extremities are non-tender to palpation; no edema; normal capillary refill; no cyanosis, no calf tenderness or swelling    SKIN: Normal color for age and race; warm; no rash NEURO: Moves all extremities equally PSYCH: The patient's mood and manner are appropriate. Grooming and personal hygiene are appropriate.  MEDICAL DECISION MAKING: Patient here with complaints of increasing pain, swelling and  bruising to her right arm where she has a thrombus in the aneurysmal segment of the cephalic vein of her brachiocephalic fistula.  She receives her dialysis now through a left permacath.  She last was dialyzed Tuesday the 13th.  She denies that anyone has tried to access the right brachiocephalic fistula recently.  No chest pain or shortness of breath.  Will obtain labs and check her INR.  We will keep her arm elevated and apply ice.  Will give pain medication and reassess.  Anticipate discussion with vascular surgery for their recommendations of possible admission versus outpatient follow-up.  No sign of cellulitis, septic arthritis, gout, compartment syndrome on exam.  No injury to suggest fracture.  ED PROGRESS: Patient's labs are unremarkable.  She has baseline anemia which is unchanged.  Her INR is slightly subtherapeutic.  Pain has barely improved after IM Dilaudid.  Will give IV Dilaudid.  She is  requesting admission which I do not feel is unreasonable and vascular surgery can be consulted not emergently in the morning.  Will discuss with hospitalist.   2:30 AM Discussed patient's case with hospitalist, Dr. Alcario Drought.  I have recommended admission and patient (and family if present) agree with this plan. Admitting physician will place admission orders.   I reviewed all nursing notes, vitals, pertinent previous records, EKGs, lab and urine results, imaging (as available).      Marieke Lubke, Delice Bison, DO 04/02/17 0230

## 2017-04-02 NOTE — ED Notes (Signed)
Patient transported to CT 

## 2017-04-02 NOTE — Progress Notes (Signed)
VASCULAR LAB PRELIMINARY  PRELIMINARY  PRELIMINARY  PRELIMINARY  Right upper extremity duplex to evaluate fistula completed.    Preliminary report: There is thrombus noted throughout the fistula from [facility] into axillary in the chest.     Curby Carswell, RVT 04/02/2017, 8:51 AM

## 2017-04-02 NOTE — Progress Notes (Addendum)
ANTICOAGULATION CONSULT NOTE - Initial Consult  Pharmacy Consult for Heparin/Coumadin Indication: H/O PE, thrombosed R AVF  No Known Allergies  Patient Measurements: Height: 5\' 6"  (167.6 cm) Weight: 162 lb (73.5 kg) IBW/kg (Calculated) : 59.3   Vital Signs: Temp: 98.7 F (37.1 C) (11/14 2219) Temp Source: Oral (11/14 2219) BP: 161/108 (11/15 0300) Pulse Rate: 93 (11/15 0300)  Labs: Recent Labs    03/30/17 1407 04/02/17 0031 04/02/17 0050  HGB  --  9.8*  --   HCT  --  31.6*  --   PLT  --  240  --   LABPROT  --   --  21.4*  INR 2.1  --  1.87  CREATININE  --  8.44*  --     Estimated Creatinine Clearance: 9.2 mL/min (A) (by C-G formula based on SCr of 8.44 mg/dL (H)).   Medical History: Past Medical History:  Diagnosis Date  . Anemia   . Blood transfusion   . Cervix carcinoma in situ Mar 2011  . CHF (congestive heart failure) ([place])   . Chronic kidney disease    T, Th, Sat  . Dialysis patient [facility])    tues, thursday, saturday.  . Gallstone pancreatitis Feb 2011  . Hypertension   . Lupus   . Lupus nephritis ([place])   . Pneumonia due to organism 03/02/2011  . Proteinuria - cause not known   . Renal failure, acute on chronic (HCC)   . Renal insufficiency   . Seizure disorder ([place]) 03/19/2013  . Thrombocytopenia (HCC)    Assessment: 41 y.o. female with h/o PE and thrombosed R AVF,INR subtherapeutic, for heparin and Coumadin  Goal of Therapy:  Heparin level 0.3-0.7 units/ml Monitor platelets by anticoagulation protocol: Yes   Plan:  Start heparin 1200 units/hr Check heparin level in 8 hours. Coumadin 2.5 mg tonight Daily INR  Akaylah Lalley, Bronson Curb 04/02/2017,3:17 AM

## 2017-04-02 NOTE — Progress Notes (Signed)
  [facility] Progress Note   Maria Vazquez is a 41 Y/O patient with ESRD on hemodialysis T,Th,S at [facility]. 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 History of bilateral pulmonary emboli on coumadin.   She was admitted 04/01/2017 as OBSERVATION PATIENT for chest pain. She had unsuccessful declot of AVF and tunneled dialysis catheter placed in radiology yesterday. Troponin negative and flat trend. We will order hemodialysis for today and will consult formally if patient status upgraded to in-patient.   HD Orders: [place] T,Th,S 3.5 hrs 160 NRe 400/Auto 2.0 74 kg 2.0 K/2.25 Ca Linear Sodium UFP 2 -Heparin 2000 units IV TIW -Mircera 150 mcg IV q 2 week (last dose 03/31/17 Last HGB 9.8 03/26/17) -Calcitriol 0.25 mcg PO TIW  BMD meds:  -Auryxia 210 mg PO TID AC -Calcium acetate 667 mg 3 caps TID AC/1 cap w/snack  Maria Fairly NP-C [facility] [phone] (pager)  Pt seen, examined and agree w A/P as above.  Kelly Splinter MD [employer] pager [phone]   04/02/2017, 2:38 PM

## 2017-04-02 NOTE — Procedures (Signed)
   I was present at this dialysis session, have reviewed the session itself and made  appropriate changes Kelly Splinter MD [place] pager [phone]   04/02/2017, 2:53 PM

## 2017-04-02 NOTE — ED Notes (Signed)
Dr. Leonides Schanz ED provider and Dr. Fabio Neighbors notified of high BP, both provider agree of not further treatment need at this time for this pt.

## 2017-04-02 NOTE — ED Notes (Signed)
Pt back from CT. CT unsuccessful due to IV placed by IV team not patent. CT stated they are calling the MD.

## 2017-04-02 NOTE — Progress Notes (Signed)
ANTICOAGULATION CONSULT NOTE - Initial Consult  Pharmacy Consult for Heparin/Coumadin Indication: H/O PE, thrombosed R AVF  No Known Allergies  Patient Measurements: Height: 5\' 6"  (167.6 cm) Weight: 162 lb (73.5 kg) IBW/kg (Calculated) : 59.3   Vital Signs: BP: 155/114 (11/15 1115) Pulse Rate: 83 (11/15 1115)  Labs: Recent Labs    04/02/17 0031 04/02/17 0050 04/02/17 1208  HGB 9.8*  --   --   HCT 31.6*  --   --   PLT 240  --   --   LABPROT  --  21.4*  --   INR  --  1.87  --   HEPARINUNFRC  --   --  0.31  CREATININE 8.44*  --   --     Estimated Creatinine Clearance: 9.2 mL/min (A) (by C-G formula based on SCr of 8.44 mg/dL (H)).  Assessment: 41 y.o. female with h/o PE and thrombosed R AVF,INR subtherapeutic, for heparin and Coumadin  Initial lvl 0.31  Goal of Therapy:  Heparin level 0.3-0.7 units/ml Monitor platelets by anticoagulation protocol: Yes   Plan:  Continue heparin 1200 units/hr Next lvl am labs  Levester Fresh, PharmD, BCPS, BCCCP Clinical Pharmacist Clinical phone for 04/02/2017 from 7a-3:30p: [phone] If after 3:30p, please call main pharmacy at: [phone] 04/02/2017 2:22 PM

## 2017-04-03 LAB — PROTIME-INR
INR: 2.52
PROTHROMBIN TIME: 27 s — AB (ref 11.4–15.2)

## 2017-04-03 LAB — CBC
HEMATOCRIT: 35.5 % — AB (ref 36.0–46.0)
HEMOGLOBIN: 10.9 g/dL — AB (ref 12.0–15.0)
MCH: 29.1 pg (ref 26.0–34.0)
MCHC: 30.7 g/dL (ref 30.0–36.0)
MCV: 94.7 fL (ref 78.0–100.0)
Platelets: 276 10*3/uL (ref 150–400)
RBC: 3.75 MIL/uL — AB (ref 3.87–5.11)
RDW: 16.4 % — ABNORMAL HIGH (ref 11.5–15.5)
WBC: 11 10*3/uL — ABNORMAL HIGH (ref 4.0–10.5)

## 2017-04-03 LAB — HEPARIN LEVEL (UNFRACTIONATED): Heparin Unfractionated: 0.4 IU/mL (ref 0.30–0.70)

## 2017-04-03 MED ORDER — HYDRALAZINE HCL 10 MG PO TABS
10.0000 mg | ORAL_TABLET | Freq: Three times a day (TID) | ORAL | Status: DC
  Administered 2017-04-03 – 2017-04-04 (×3): 10 mg via ORAL
  Filled 2017-04-03 (×3): qty 1

## 2017-04-03 MED ORDER — MORPHINE SULFATE (PF) 2 MG/ML IV SOLN
1.0000 mg | INTRAVENOUS | Status: DC | PRN
  Administered 2017-04-03 – 2017-04-04 (×4): 1 mg via INTRAVENOUS
  Filled 2017-04-03 (×4): qty 1

## 2017-04-03 MED ORDER — OXYCODONE-ACETAMINOPHEN 5-325 MG PO TABS
1.0000 | ORAL_TABLET | ORAL | Status: DC | PRN
  Administered 2017-04-03 – 2017-04-04 (×2): 1 via ORAL
  Filled 2017-04-03 (×2): qty 1

## 2017-04-03 MED ORDER — HYDRALAZINE HCL 10 MG PO TABS
10.0000 mg | ORAL_TABLET | Freq: Three times a day (TID) | ORAL | Status: DC
  Filled 2017-04-03: qty 1

## 2017-04-03 MED ORDER — FERRIC CITRATE 1 GM 210 MG(FE) PO TABS
210.0000 mg | ORAL_TABLET | Freq: Three times a day (TID) | ORAL | Status: DC
  Administered 2017-04-03 – 2017-04-04 (×3): 210 mg via ORAL
  Filled 2017-04-03 (×5): qty 1

## 2017-04-03 MED ORDER — WARFARIN SODIUM 2.5 MG PO TABS
2.5000 mg | ORAL_TABLET | Freq: Once | ORAL | Status: AC
  Administered 2017-04-03: 2.5 mg via ORAL
  Filled 2017-04-03: qty 1

## 2017-04-03 NOTE — Progress Notes (Signed)
ANTICOAGULATION CONSULT NOTE - Initial Consult  Pharmacy Consult for Heparin/Coumadin Indication: H/O PE, thrombosed R AVF  No Known Allergies  Patient Measurements: Height: 5\' 6"  (167.6 cm) Weight: 163 lb 2.3 oz (74 kg) IBW/kg (Calculated) : 59.3   Vital Signs: Temp: 98.7 F (37.1 C) (11/16 0531) Temp Source: Oral (11/16 0531) BP: 153/99 (11/16 0531) Pulse Rate: 88 (11/16 0531)  Labs: Recent Labs    04/02/17 0031 04/02/17 0050 04/02/17 1208 04/03/17 0521  HGB 9.8*  --   --  10.9*  HCT 31.6*  --   --  35.5*  PLT 240  --   --  276  LABPROT  --  21.4*  --  27.0*  INR  --  1.87  --  2.52  HEPARINUNFRC  --   --  0.31 0.40  CREATININE 8.44*  --   --   --     Estimated Creatinine Clearance: 9.2 mL/min (A) (by C-G formula based on SCr of 8.44 mg/dL (H)).  Assessment: 41 y.o. female with ESRD who presented on 11/15 with worsening RUE pain and swelling found to have partially thrombosed R-AVF. The patient was already on warfarin PTA for hx PE. Admit INR 1.87 on PTA dose - Heparin bridge was started.   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 Discussed with MD Patrecia Pour) - both heparin drip and INR therapeutic this morning. Will discontinue the heparin bridge and continue with warfarin monotherapy for now.   INR this morning is therapeutic (INR 2.52 << 1.87, goal of 2-3). CBC stable - no bleeding noted.   Goal of Therapy:  INR 2-3 Monitor platelets by anticoagulation protocol: Yes   Plan:  1. D/c Heparin 2. Warfarin 2.5 mg x 1 dose at 1800 today 3. Will continue to monitor for any signs/symptoms of bleeding and will follow up with PT/INR in the a.m.  Thank you for allowing pharmacy to be a part of this patient's care.  Alycia Rossetti, PharmD, BCPS Clinical Pharmacist Pager: [phone] Clinical phone for 04/03/2017 from 7a-3:30p: [phone] If after 3:30p, please call main pharmacy at: [phone] 04/03/2017 9:57 AM

## 2017-04-03 NOTE — Progress Notes (Addendum)
PROGRESS NOTE [facility]   Saleen Peden   QHU:[id number] DOB: [birth year]  DOA: 04/01/2017 PCP: Estanislado Emms, MD   Brief Narrative:  Maria Vazquez is a 41 year old female with medical history significant for end-stage renal disease on dialysis due to SLE nephritis, seizure disorder who presented to the emergency department complaining of right upper arm pain and swelling.  Patient known to have an occlusive thrombus on her right upper extremity fistula.  Previously attempted declot with PTA but unsuccessful, she was started on Coumadin.  Patient admitted with working diagnosis of thrombosed fistula and pain management.  Vascular surgery was consulted.   Subjective: Patient seen and examined, she is complaining of nausea and vomiting has had 3 episodes of vomiting during the day.  Right upper extremity very painful and tender.  Assessment & Plan: Thrombophlebitis in setting of thrombosed right upper extremity fistula. INR on admission was supratherapeutic likely causing acute thrombosis. Vascular surgery was consulted, recommending treatment for thrombophlebitis, warm compresses and arm elevation patient eventually will need surgical management but not at this point. Continue pain control as needed, warm compresses and right arm elevation  Continue Coumadin INR today therapeutic  End-stage renal disease Continue hemodialysis per renal recommendation  Bilateral PE Initially treated with heparin bridge to Coumadin Continue to monitor  Elevated BP  Not on meds as OP, pian vs new HTN diagnosis  Started on hydralazine 10 mg TID  Monitor BP closely   Nausea and vomiting Unclear if secondary to medication side effect versus uremia vs HTN Continue supportive measure, Zofran.  If tolerates diet in a.m. DC home  DVT prophylaxis: Coumadin Code Status: Full code Family Communication: None at bedside Disposition Plan: DC in a.m. if tolerating  diet   Consultants:   Vascular surgery  Nephrology  Procedures:   None  Antimicrobials:  None   Objective: Vitals:   04/03/17 0531 04/03/17 0802 04/03/17 1120 04/03/17 1400  BP: (!) 153/99 (!) 147/105 (!) 171/90 (!) 173/99  Pulse: 88 96    Resp:      Temp: 98.7 F (37.1 C) 98.9 F (37.2 C)    TempSrc: Oral Oral    SpO2: 100% 99%    Weight:      Height:        Intake/Output Summary (Last 24 hours) at 04/03/2017 1641 Last data filed at 04/03/2017 1400 Gross per 24 hour  Intake 621.8 ml  Output 2100 ml  Net -1478.2 ml   Filed Weights   04/02/17 1336 04/02/17 1715 04/02/17 2049  Weight: 75.6 kg (166 lb 10.7 oz) 74 kg (163 lb 2.3 oz) 74 kg (163 lb 2.3 oz)    Examination:  General exam: NAD HEENT: OP moist and clear Respiratory system: Clear to auscultation. No wheezes,crackle or rhonchi.  Left tunneled cath  Cardiovascular system: S1 & S2 heard, RRR. No JVD, murmurs, rubs or gallops Gastrointestinal system: Abdomen is nondistended, soft and nontender. Normal bowel sounds heard. Central nervous system: Alert and oriented. No focal neurological deficits. Extremities: Right upper extremity edematous, mild erythema, tender to palpation, no thrill felt on fistula Skin: Mild erythema RUE  Psychiatry: Mood & affect appropriate.    Data Reviewed: I have personally reviewed following labs and imaging studies  CBC: Recent Labs  Lab 04/02/17 0031 04/03/17 0521  WBC 9.4 11.0*  NEUTROABS 6.1  --   HGB 9.8* 10.9*  HCT 31.6* 35.5*  MCV 94.0 94.7  PLT 240 465   Basic Metabolic Panel: Recent Labs  Lab 04/02/17 0031  NA 137  K 4.4  CL 99*  CO2 25  GLUCOSE 84  BUN 34*  CREATININE 8.44*  CALCIUM 8.8*   GFR: Estimated Creatinine Clearance: 9.2 mL/min (A) (by C-G formula based on SCr of 8.44 mg/dL (H)). Liver Function Tests: No results for input(s): AST, ALT, ALKPHOS, BILITOT, PROT, ALBUMIN in the last 168 hours. No results for input(s): LIPASE, AMYLASE  in the last 168 hours. No results for input(s): AMMONIA in the last 168 hours. Coagulation Profile: Recent Labs  Lab 03/30/17 1407 04/02/17 0050 04/03/17 0521  INR 2.1 1.87 2.52   Cardiac Enzymes: No results for input(s): CKTOTAL, CKMB, CKMBINDEX, TROPONINI in the last 168 hours. BNP (last 3 results) No results for input(s): PROBNP in the last 8760 hours. HbA1C: No results for input(s): HGBA1C in the last 72 hours. CBG: No results for input(s): GLUCAP in the last 168 hours. Lipid Profile: No results for input(s): CHOL, HDL, LDLCALC, TRIG, CHOLHDL, LDLDIRECT in the last 72 hours. Thyroid Function Tests: No results for input(s): TSH, T4TOTAL, FREET4, T3FREE, THYROIDAB in the last 72 hours. Anemia Panel: No results for input(s): VITAMINB12, FOLATE, FERRITIN, TIBC, IRON, RETICCTPCT in the last 72 hours. Sepsis Labs: No results for input(s): PROCALCITON, LATICACIDVEN in the last 168 hours.  No results found for this or any previous visit (from the past 240 hour(s)).    Radiology Studies: Ct Angio Up Extrem Right W &/or Wo Contrast  Result Date: 04/03/2017 CLINICAL DATA:  Right arm AV fistula thrombosis EXAM: CT ANGIOGRAPHY OF THE RIGHT UPPEREXTREMITY TECHNIQUE: Multidetector CT imaging of the right upperwas performed using the standard protocol during bolus administration of intravenous contrast. Multiplanar CT image reconstructions and MIPs were obtained to evaluate the vascular anatomy. CONTRAST:  165mL ISOVUE-370 IOPAMIDOL (ISOVUE-370) INJECTION 76% COMPARISON:  None. FINDINGS: The ascending aorta is partially visualized. It is grossly patent. There is no obvious dissection. Maximal diameter is 3.0 cm. Innominate artery is patent. Visualized portions of the right common carotid and vertebral arteries are patent. Right subclavian and axillary arteries are patent. Early branching of the ulnar artery occurs beyond the axillary artery. The ulnar artery is patent to the wrist. The  interosseous artery is also patent emanating from the ulnar The brachial artery emanates from the axillary artery. The brachial artery eventually continues on as the radial artery in the forearm. There is a large dilated superficial and tubular structure in the medial soft tissues of the right arm leading to the cephalic subclavian junction. It is predominately low-density likely due to thrombosis. There is a predominately low-density tubular structure in the distal arm. This is most consistent with the outflow vein from an AV fistula which is thrombosed The peripheral aspect of the suspected thrombosed vein is somewhat higher density and partially calcified. There may be some flow for contrast into the peripheral aspect of the outflow vein at adjacent to the arteriovenous anastomosis. This area measures 2.7 cm in diameter. The higher density may represent more acute thrombosis. At the suspected arteriovenous fistula adjacent to the antecubital fossa, the brachial artery is somewhat narrowed but fine detail is obscured. There is also a focal calcified aneurysm emanating from the brachial artery at the anastomosis measuring 1.7 cm. Beyond the arteriovenous anastomosis, the radial artery is diminutive and patent. Review of the MIP images confirms the above findings. IMPRESSION: The above findings are most consistent with a right antecubital fossa arteriovenous fistula with the outflow through the cephalic vein. The outflow vein is suspected to be thrombosed, however  this should be confirmed with dedicated arteriovenous fistula angiography or ultrasound. There may be some flow at the peripheral aspect, adjacent to the arteriovenous anastomosis, characterized as somewhat higher density within the vessel lumen. Alternatively, this area may represent more acute thrombosis. 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 There is narrowing of the brachial  artery, at the antecubital arteriovenous anastomosis but fine detail is obscured. Electronically Signed   By: Marybelle Killings M.D.   On: 04/03/2017 07:52      Scheduled Meds: . calcitRIOL  0.25 mcg Oral Q T,Th,Sa-HD  . calcium acetate  2,001 mg Oral TID WC  . ferric citrate  210 mg Oral TID WC  . hydrALAZINE  10 mg Oral Q8H  . hydroxychloroquine  200 mg Oral Daily  . levETIRAcetam  500 mg Oral BID  . levETIRAcetam  500 mg Oral Q T,Th,Sa-HD  . nortriptyline  50 mg Oral Daily  . warfarin  2.5 mg Oral ONCE-1800  . Warfarin - Pharmacist Dosing Inpatient   Does not apply q1800   Continuous Infusions:   LOS: 1 day    Time spent: Total of 25 minutes spent with pt, greater than 50% of which was spent in discussion of  treatment, counseling and coordination of care   Chipper Oman, MD Pager: Text Page via [email or web address]   If 7PM-7AM, please contact night-coverage [email or web address] 04/03/2017, 4:41 PM

## 2017-04-03 NOTE — Consult Note (Signed)
[facility] Renal Consultation Note  Indication for Consultation:  Management of ESRD/hemodialysis; anemia, hypertension/volume and secondary hyperparathyroidism  PNT:[id number] Maria Vazquez is a 41 Y/O patient with ESRD on hemodialysis T,Th,S at [facility]. 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 History of bilateral pulmonary emboli on coumadin. She was noted to have a Clotted R UA AVF and and sent to [facility] IR for declotting .Noted  prior declot 03/13/17 by IR .  She was admitted 04/01/2017 as OBSERVATION PATIENT for worsening pan in R Upper arm . She had unsuccessful declot of AVF and tunneled dialysis catheter placed in radiology 03/31/17 . She had hemodialysis yesterday via perm cath  without problems.    Today noted she is admitted  to in-patient.   Today  Co continued Right upper arm discomfort slightly better .This am noted seen by Dr. 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 ""       Past Medical History:  Diagnosis Date  . Anemia   . Blood transfusion   . Cervix carcinoma in situ Mar 2011  . CHF (congestive heart failure) ([place])   . Chronic kidney disease    T, Th, Sat  . Dialysis patient [facility])    tues, thursday, saturday.  . Gallstone pancreatitis Feb 2011  . Hypertension   . Lupus   . Lupus nephritis ([place])   . Pneumonia due to organism 03/02/2011  . Proteinuria - cause not known   . Renal failure, acute on chronic (HCC)   . Renal insufficiency   . Seizure disorder ([place]) 03/19/2013  . Thrombocytopenia ([place])     Past Surgical History:  Procedure Laterality Date  . APPENDECTOMY    . AV FISTULA PLACEMENT    . AV FISTULA PLACEMENT  07/18/2011    Procedure: ARTERIOVENOUS (AV) FISTULA CREATION;  Surgeon: Angelia Mould, MD;  Location: [place];  Service: Vascular;  Laterality: Right;  . AV FISTULA PLACEMENT  01/23/12   Revision of Right AVF  . CERVICAL CONE BIOPSY    . CESAREAN SECTION    . CHOLECYSTECTOMY    . EMBOLECTOMY  10/10/2011   Procedure: EMBOLECTOMY;  Surgeon: Angelia Mould, MD;  Location: [place];  Service: Vascular;  Laterality: Right;  . FISTULOGRAM Right 04/12/2012   Procedure: FISTULOGRAM;  Surgeon: Angelia Mould, MD;  [facility] CATH LAB;  Service: Cardiovascular;  Laterality: Right;  . IR FLUORO GUIDE CV LINE LEFT  03/13/2017  . IR THROMBECTOMY AV FISTULA W/THROMBOLYSIS/PTA INC/SHUNT/IMG RIGHT Right 03/13/2017  . IR US GUIDE VASC ACCESS LEFT  03/13/2017  . IR US GUIDE VASC ACCESS RIGHT  03/13/2017  . SHUNTOGRAM Right 07/07/2011   Procedure: SHUNTOGRAM;  Surgeon: Angelia Mould, MD;  Location: [facility] CATH LAB;  Service: Cardiovascular;  Laterality: Right;  . SHUNTOGRAM N/A 12/08/2011   Procedure: Earney Mallet;  Surgeon: Angelia Mould, MD;  Location: [facility] CATH LAB;  Service: Cardiovascular;  Laterality: N/A;      Family History  Problem Relation Age of Onset  . Diabetes Mother   . Diabetes Brother   . Diabetes Brother   . Diabetes Maternal Grandmother   . Anesthesia problems Neg Hx       reports that  has never smoked. she  has never used smokeless tobacco. She reports that she does not drink alcohol or use drugs.  No Known Allergies  Prior to Admission medications   Medication Sig Start Date End Date Taking? Authorizing Provider  acetaminophen (TYLENOL) 500 MG tablet Take 500 mg by mouth every 6 (six) hours as needed for mild pain or moderate pain.   Yes [provider]  calcium acetate (PHOSLO) 667 MG capsule Take 1,334-2,001 mg See admin instructions by mouth. 2,001 mg three times a day with meals and 1,334 mg with snacks   Yes [provider]  ferric citrate  (AURYXIA) 1 GM 210 MG(Fe) tablet Take 420-630 mg See admin instructions by mouth. 420 mg on Sun/Mon/Wed/Fri and 630 mg on Tues/Thurs/Sat   Yes [provider]  hydroxychloroquine (PLAQUENIL) 200 MG tablet Take 1 tablet (200 mg total) by mouth daily. 01/13/15  Yes Kathie Dike, MD  levETIRAcetam (KEPPRA) 500 MG tablet Take 500 mg See admin instructions by mouth. 500 mg two times a day and an additional 500 mg on Tues/Thurs/Sat after dialysis   Yes [provider]  nortriptyline (PAMELOR) 50 MG capsule Take 50 mg by mouth daily.   Yes [provider]  warfarin (COUMADIN) 5 MG tablet Take 1 tablet (5 mg total) by mouth one time only at 6 PM. Patient taking differently: Take 2.5 mg every evening by mouth.  03/19/17  Yes Mikhail, Velta Addison, DO    HQI:[id number] **OR** acetaminophen, HYDROmorphone (DILAUDID) injection, ondansetron **OR** ondansetron (ZOFRAN) IV  Results for orders placed or performed during the hospital encounter of 04/01/17 (from the past 48 hour(s))  CBC with Differential     Status: Abnormal   Collection Time: 04/02/17 12:31 AM  Result Value Ref Range   WBC 9.4 4.0 - 10.5 K/uL   RBC 3.36 (L) 3.87 - 5.11 MIL/uL   Hemoglobin 9.8 (L) 12.0 - 15.0 g/dL   HCT 31.6 (L) 36.0 - 46.0 %   MCV 94.0 78.0 - 100.0 fL   MCH 29.2 26.0 - 34.0 pg   MCHC 31.0 30.0 - 36.0 g/dL   RDW 15.8 (H) 11.5 - 15.5 %   Platelets 240 150 - 400 K/uL   Neutrophils Relative % 66 %   Neutro Abs 6.1 1.7 - 7.7 K/uL   Lymphocytes Relative 27 %   Lymphs Abs 2.5 0.7 - 4.0 K/uL   Monocytes Relative 5 %   Monocytes Absolute 0.5 0.1 - 1.0 K/uL   Eosinophils Relative 2 %   Eosinophils Absolute 0.2 0.0 - 0.7 K/uL   Basophils Relative 0 %   Basophils Absolute 0.0 0.0 - 0.1 K/uL  Basic metabolic panel     Status: Abnormal   Collection Time: 04/02/17 12:31 AM  Result Value Ref Range   Sodium 137 135 - 145 mmol/L   Potassium 4.4 3.5 - 5.1 mmol/L   Chloride 99 (L) 101 - 111 mmol/L   CO2  25 22 - 32 mmol/L   Glucose, Bld 84 65 - 99 mg/dL   BUN 34 (H) 6 - 20 mg/dL   Creatinine, Ser 8.44 (H) 0.44 - 1.00 mg/dL   Calcium 8.8 (L) 8.9 - 10.3 mg/dL   GFR calc non Af Amer 5 (L) >60 mL/min   GFR calc Af Amer 6 (L) >60 mL/min    Comment: (NOTE) The eGFR has been calculated using the CKD EPI equation. This calculation has not been validated in all clinical situations. eGFR's persistently <60 mL/min signify possible Chronic Kidney Disease.  Anion gap 13 5 - 15  Protime-INR     Status: Abnormal   Collection Time: 04/02/17 12:50 AM  Result Value Ref Range   Prothrombin Time 21.4 (H) 11.4 - 15.2 seconds   INR 1.87   Heparin level (unfractionated)     Status: None   Collection Time: 04/02/17 12:08 PM  Result Value Ref Range   Heparin Unfractionated 0.31 0.30 - 0.70 IU/mL    Comment:        IF HEPARIN RESULTS ARE BELOW EXPECTED VALUES, AND PATIENT DOSAGE HAS BEEN CONFIRMED, SUGGEST FOLLOW UP TESTING OF ANTITHROMBIN III LEVELS.   Heparin level (unfractionated)     Status: None   Collection Time: 04/03/17  5:21 AM  Result Value Ref Range   Heparin Unfractionated 0.40 0.30 - 0.70 IU/mL    Comment:        IF HEPARIN RESULTS ARE BELOW EXPECTED VALUES, AND PATIENT DOSAGE HAS BEEN CONFIRMED, SUGGEST FOLLOW UP TESTING OF ANTITHROMBIN III LEVELS.   CBC     Status: Abnormal   Collection Time: 04/03/17  5:21 AM  Result Value Ref Range   WBC 11.0 (H) 4.0 - 10.5 K/uL   RBC 3.75 (L) 3.87 - 5.11 MIL/uL   Hemoglobin 10.9 (L) 12.0 - 15.0 g/dL   HCT 35.5 (L) 36.0 - 46.0 %   MCV 94.7 78.0 - 100.0 fL   MCH 29.1 26.0 - 34.0 pg   MCHC 30.7 30.0 - 36.0 g/dL   RDW 16.4 (H) 11.5 - 15.5 %   Platelets 276 150 - 400 K/uL  Protime-INR     Status: Abnormal   Collection Time: 04/03/17  5:21 AM  Result Value Ref Range   Prothrombin Time 27.0 (H) 11.4 - 15.2 seconds   INR 2.52   .  ROS: see hpi original no reported positives other than arm pain reported top me.  Physical Exam: Vitals:    04/03/17 0531 04/03/17 0802  BP: (!) 153/99 (!) 147/105  Pulse: 88 96  Resp:    Temp: 98.7 F (37.1 C) 98.9 F (37.2 C)  SpO2: 100% 99%     General: Alert Hispanic female, NAD , Cooperative  HEENT: [place]  EOMI, MMM  Neck: no jvd, supple Heart: RRR ,no mur, gallop or rub Lungs: CTA bilat  Abdomen: bs pos , soft , NT, ND  Extremities: No pedal edema , Right Upper arm swelling ?avf area  Skin: no overt rash, warm dry Neuro:  Alert no acute focal deficits noted  Dialysis Access:  LI IJ Perm cath , R UA AVF Np bruit /thrill with clotted avf    Dialysis Orders:  [place] T,Th,S 3.5 hrs 160 NR 74 kg 2.0 K/2.25 Ca Linear Sodium UFP 2 -Heparin 2000 units IV TIW -Mircera 150 mcg IV q 2 week (last dose 03/31/17 Last HGB 9.8 03/26/17) -Calcitriol 0.25 mcg PO TIW  BMD meds:  -Auryxia 210 mg PO TID AC -Calcium acetate 667 mg 3 caps TID AC/1 cap w/snack   Assessment/Plan  1.  Recurrent Clotted R Upper Arm AVF  Sp Unsuccessful declotting  11/13, prior declot 03/13/17 by IR .  Now with post op discomfort , VVS following  With plans noted =intervene in the future if necessary when edema resolved but for now would treat symptoms as thrombophlebitis with warm compresses and elevation along with anticoagulation.  2.ESRD -  HD TTS  3. Hypertension/volume  - bp up ?/ pain ,no current bp meds or as op.  At edw  yest post , lower edw at dc  4.  Anemia  - hgb 10.9  No esa needs currently LAST Given 03/31/17 5.Metabolic bone disease -  On 2 binders  Fu ca /phos labs  Pre hd in am , Hec on hd . Ca 8.8  6. HO PE -  On Coumadin as op / Hep/coumadin   Per Pharmacy 7. HO SLE- on Plaquenil   Ernest Haber, PA-C [place] [phone] 04/03/2017, 9:23 AM   Pt seen, examined and agree w A/P as above. ESRD patient on HD with recurrent clotting of RUA AV fistula with associated RUE edema.  VVS has seen. Getting IV heparin now, was on coumadin at home.  Plan is for HD on  TTS schedule while here. Vol / lytes are stable.    Kelly Splinter MD [employer] pager [phone]   04/03/2017, 2:30 PM

## 2017-04-03 NOTE — Progress Notes (Signed)
  Progress Note    04/03/2017 8:54 AM * No surgery found *  Subjective:  Still having pain in right arm  Vitals:   04/02/17 2049 04/03/17 0531  BP: (!) 143/96 (!) 153/99  Pulse: 99 88  Resp: 20   Temp: 98.5 F (36.9 C) 98.7 F (37.1 C)  SpO2: 100% 100%    Physical Exam: [id number] Non labored Right arm with stable edema Palpable right radial pulse  CBC    Component Value Date/Time   WBC 11.0 (H) 04/03/2017 0521   RBC 3.75 (L) 04/03/2017 0521   HGB 10.9 (L) 04/03/2017 0521   HCT 35.5 (L) 04/03/2017 0521   PLT 276 04/03/2017 0521   MCV 94.7 04/03/2017 0521   MCH 29.1 04/03/2017 0521   MCHC 30.7 04/03/2017 0521   RDW 16.4 (H) 04/03/2017 0521   LYMPHSABS 2.5 04/02/2017 0031   MONOABS 0.5 04/02/2017 0031   EOSABS 0.2 04/02/2017 0031   BASOSABS 0.0 04/02/2017 0031    BMET    Component Value Date/Time   NA 137 04/02/2017 0031   K 4.4 04/02/2017 0031   CL 99 (L) 04/02/2017 0031   CO2 25 04/02/2017 0031   GLUCOSE 84 04/02/2017 0031   BUN 34 (H) 04/02/2017 0031   CREATININE 8.44 (H) 04/02/2017 0031   CALCIUM 8.8 (L) 04/02/2017 0031   CALCIUM 7.9 (L) 03/06/2011 1200   GFRNONAA 5 (L) 04/02/2017 0031   GFRAA 6 (L) 04/02/2017 0031    INR    Component Value Date/Time   INR 2.52 04/03/2017 0521     Intake/Output Summary (Last 24 hours) at 04/03/2017 0854 Last data filed at 04/03/2017 0200 Gross per 24 hour  Intake 261.8 ml  Output 1800 ml  Net -1538.2 ml   IMPRESSION: The above findings are most consistent with a right antecubital fossa arteriovenous fistula with the outflow through the cephalic vein.  The outflow vein is suspected to be thrombosed, however this should be confirmed with dedicated arteriovenous fistula angiography or ultrasound. There may be some flow at the peripheral aspect, adjacent to the arteriovenous anastomosis, characterized as somewhat higher density within the vessel lumen. Alternatively, this area may represent more acute  thrombosis.  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  There is narrowing of the brachial artery, at the antecubital arteriovenous anastomosis but fine detail is obscured.   Assessment:  41 y.o. female is here with right arm swelling  Plan: Saccular aneurysm is calcified and appears very chronic. Can intervene in the future if necessary when edema resolved but for now would treat symptoms as thrombophlebitis with warm compresses and elevation along with anticoagulation.    Lamisha Roussell C. Donzetta Matters, MD [facility] Office: [phone] Pager: [phone]  04/03/2017 8:54 AM

## 2017-04-04 DIAGNOSIS — I8290 Acute embolism and thrombosis of unspecified vein: Secondary | ICD-10-CM

## 2017-04-04 DIAGNOSIS — Z992 Dependence on renal dialysis: Secondary | ICD-10-CM

## 2017-04-04 DIAGNOSIS — I2699 Other pulmonary embolism without acute cor pulmonale: Secondary | ICD-10-CM

## 2017-04-04 DIAGNOSIS — T82898D Other specified complication of vascular prosthetic devices, implants and grafts, subsequent encounter: Secondary | ICD-10-CM

## 2017-04-04 DIAGNOSIS — N186 End stage renal disease: Secondary | ICD-10-CM

## 2017-04-04 DIAGNOSIS — T8249XD Other complication of vascular dialysis catheter, subsequent encounter: Secondary | ICD-10-CM

## 2017-04-04 LAB — CBC
HEMATOCRIT: 33.6 % — AB (ref 36.0–46.0)
Hemoglobin: 10.3 g/dL — ABNORMAL LOW (ref 12.0–15.0)
MCH: 28.9 pg (ref 26.0–34.0)
MCHC: 30.7 g/dL (ref 30.0–36.0)
MCV: 94.1 fL (ref 78.0–100.0)
PLATELETS: 255 10*3/uL (ref 150–400)
RBC: 3.57 MIL/uL — ABNORMAL LOW (ref 3.87–5.11)
RDW: 16.8 % — AB (ref 11.5–15.5)
WBC: 9.2 10*3/uL (ref 4.0–10.5)

## 2017-04-04 LAB — PROTIME-INR
INR: 3.86
Prothrombin Time: 37.6 seconds — ABNORMAL HIGH (ref 11.4–15.2)

## 2017-04-04 LAB — BASIC METABOLIC PANEL
Anion gap: 13 (ref 5–15)
BUN: 27 mg/dL — AB (ref 6–20)
CALCIUM: 9.1 mg/dL (ref 8.9–10.3)
CO2: 25 mmol/L (ref 22–32)
CREATININE: 8.3 mg/dL — AB (ref 0.44–1.00)
Chloride: 94 mmol/L — ABNORMAL LOW (ref 101–111)
GFR, EST AFRICAN AMERICAN: 6 mL/min — AB (ref >60)
GFR, EST NON AFRICAN AMERICAN: 5 mL/min — AB (ref >60)
GLUCOSE: 81 mg/dL (ref 65–99)
Potassium: 4.4 mmol/L (ref 3.5–5.1)
SODIUM: 132 mmol/L — AB (ref 135–145)

## 2017-04-04 MED ORDER — HEPARIN SODIUM (PORCINE) 1000 UNIT/ML DIALYSIS: 1000.0000 [IU] | INTRAMUSCULAR | Status: DC | PRN

## 2017-04-04 MED ORDER — HEPARIN SODIUM (PORCINE) 1000 UNIT/ML DIALYSIS: 2000.0000 [IU] | Freq: Once | INTRAMUSCULAR | Status: DC

## 2017-04-04 MED ORDER — LIDOCAINE HCL (PF) 1 % IJ SOLN: 5.0000 mL | INTRAMUSCULAR | Status: DC | PRN

## 2017-04-04 MED ORDER — ONDANSETRON 4 MG PO TBDP: 4.0000 mg | ORAL_TABLET | Freq: Three times a day (TID) | ORAL | 0 refills | Status: DC | PRN

## 2017-04-04 MED ORDER — PENTAFLUOROPROP-TETRAFLUOROETH EX AERO: 1.0000 "application " | INHALATION_SPRAY | CUTANEOUS | Status: DC | PRN

## 2017-04-04 MED ORDER — LIDOCAINE-PRILOCAINE 2.5-2.5 % EX CREA: 1.0000 "application " | TOPICAL_CREAM | CUTANEOUS | Status: DC | PRN

## 2017-04-04 MED ORDER — ALTEPLASE 2 MG IJ SOLR: 2.0000 mg | Freq: Once | INTRAMUSCULAR | Status: DC | PRN

## 2017-04-04 MED ORDER — SODIUM CHLORIDE 0.9 % IV SOLN: 100.0000 mL | INTRAVENOUS | Status: DC | PRN

## 2017-04-04 MED ORDER — HYDRALAZINE HCL 10 MG PO TABS: 10.0000 mg | ORAL_TABLET | Freq: Three times a day (TID) | ORAL | 3 refills | Status: DC

## 2017-04-04 MED ORDER — ACETAMINOPHEN-CODEINE #3 300-30 MG PO TABS: 1.0000 | ORAL_TABLET | Freq: Four times a day (QID) | ORAL | 0 refills | Status: DC | PRN

## 2017-04-04 MED ORDER — WARFARIN SODIUM 5 MG PO TABS: 2.5000 mg | ORAL_TABLET | Freq: Every evening | ORAL | 0 refills | Status: DC

## 2017-04-04 NOTE — Progress Notes (Signed)
Maria Vazquez to be D/C'd Home per MD order.  Discussed prescriptions and follow up appointments with the patient. Prescriptions given to patient, medication list explained in detail. Pt verbalized understanding.  Allergies as of 04/04/2017   No Known Allergies     Medication List    TAKE these medications   acetaminophen 500 MG tablet Commonly known as:  TYLENOL Take 500 mg by mouth every 6 (six) hours as needed for mild pain or moderate pain.   acetaminophen-codeine 300-30 MG tablet Commonly known as:  TYLENOL #3 Take 1 tablet every 6 (six) hours as needed by mouth for moderate pain or severe pain.   AURYXIA 1 GM 210 MG(Fe) tablet Generic drug:  ferric citrate Take 420-630 mg See admin instructions by mouth. 420 mg on Sun/Mon/Wed/Fri and 630 mg on Tues/Thurs/Sat   calcium acetate 667 MG capsule Commonly known as:  PHOSLO Take 1,334-2,001 mg See admin instructions by mouth. 2,001 mg three times a day with meals and 1,334 mg with snacks   hydrALAZINE 10 MG tablet Commonly known as:  APRESOLINE Take 1 tablet (10 mg total) 3 (three) times daily by mouth.   hydroxychloroquine 200 MG tablet Commonly known as:  PLAQUENIL Take 1 tablet (200 mg total) by mouth daily.   levETIRAcetam 500 MG tablet Commonly known as:  KEPPRA Take 500 mg See admin instructions by mouth. 500 mg two times a day and an additional 500 mg on Tues/Thurs/Sat after dialysis   nortriptyline 50 MG capsule Commonly known as:  PAMELOR Take 50 mg by mouth daily.   ondansetron 4 MG disintegrating tablet Commonly known as:  ZOFRAN ODT Take 1 tablet (4 mg total) every 8 (eight) hours as needed by mouth for nausea or vomiting.   warfarin 5 MG tablet Commonly known as:  COUMADIN Take as directed. If you are unsure how to take this medication, talk to your nurse or doctor. Original instructions:  Take 0.5 tablets (2.5 mg total) every evening by mouth.       Vitals:   04/04/17 1200 04/04/17 1210   BP: 134/79 121/71  Pulse: 89 88  Resp: 19 18  Temp:  (!) 97.4 F (36.3 C)  SpO2:  99%    Skin clean, dry and intact without evidence of skin break down, no evidence of skin tears noted. IV catheter discontinued intact. Site without signs and symptoms of complications. Dressing and pressure applied. Pt denies pain at this time. No complaints noted.  An After Visit Summary was printed and given to the patient. Patient escorted via [place], and D/C home via private auto.  Haywood Lasso BSN, RN [facility] [address] Phone [phone]

## 2017-04-04 NOTE — Progress Notes (Signed)
[facility] Progress Note  Subjective: no new c/o. R arm still hurting where the clot is.  No SOB or CP.    Vitals:   04/03/17 1400 04/03/17 2117 04/04/17 0512 04/04/17 0830  BP: (!) 173/99 (!) 167/104 (!) 156/99 (!) 193/113  Pulse:  89 94 88  Resp:  17 17 18   Temp:  98.3 F (36.8 C) 98.5 F (36.9 C) 98.3 F (36.8 C)  TempSrc:    Oral  SpO2:  98% 99% 99%  Weight:  74.9 kg (165 lb 2 oz)  73.8 kg (162 lb 11.2 oz)  Height:        Inpatient medications: . calcitRIOL  0.25 mcg Oral Q T,Th,Sa-HD  . calcium acetate  2,001 mg Oral TID WC  . ferric citrate  210 mg Oral TID WC  . heparin  2,000 Units Dialysis Once in dialysis  . hydrALAZINE  10 mg Oral Q8H  . hydroxychloroquine  200 mg Oral Daily  . levETIRAcetam  500 mg Oral BID  . levETIRAcetam  500 mg Oral Q T,Th,Sa-HD  . nortriptyline  50 mg Oral Daily  . Warfarin - Pharmacist Dosing Inpatient   Does not apply q1800   . sodium chloride    . sodium chloride     sodium chloride, sodium chloride, acetaminophen **OR** acetaminophen, alteplase, heparin, lidocaine (PF), lidocaine-prilocaine, morphine injection, ondansetron **OR** ondansetron (ZOFRAN) IV, oxyCODONE-acetaminophen, pentafluoroprop-tetrafluoroeth  Exam: Neck: no jvd, supple Heart: RRR ,no mur, gallop or rub Lungs: CTA bilat  Abdomen: bs pos , soft , NT, ND  Extremities: No pedal edema , Right Upper arm swelling ?avf area  Skin: no overt rash, warm dry Neuro:  Alert no acute focal deficits noted  Dialysis Access:  LI IJ Perm cath , R UA AVF Np bruit /thrill with clotted avf    Dialysis Orders:  [place] T,Th,S 3.5 hrs 160 NR 74 kg 2.0 K/2.25 Ca Linear Sodium UFP 2 -Heparin 2000 units IV TIW -Mircera 150 mcg IV q 2 week (last dose 03/31/17 Last HGB 9.8 03/26/17) -Calcitriol 0.25 mcg PO TIW  BMD meds:  -Auryxia 210 mg PO TID AC -Calcium acetate 667 mg 3 caps TID AC/1 cap w/snack   Assessment/Plan:  1.  Recurrent Clotted R  Upper Arm AVF  Sp Unsuccessful declotting  11/13, prior declot 03/13/17 by IR . VVS has seen, plan to possibly intervene in the future if necessary when edema resolved but for now would treat symptoms as thrombophlebitis with warm compresses and elevation along with anticoagulation. On coumadin, INR > 2.   2.ESRD -  HD TTS . HD today, UF max as tol 3. Hypertension/volume  - BP's up, prob has excess volume, try to lower edw w/ HD today 4.  Anemia  - hgb 10.9  No esa needs currently LAST Given 03/31/17 5.Metabolic bone disease -  On 2 binders  Fu ca /phos labs  Pre hd in am , Hec on hd 6. HO PE -  On Coumadin as op / Hep/coumadin   Per Pharmacy 7. HO SLE- on Plaquenil  8. Dispo - per pt may be going home today    Kelly Splinter MD [place] pager [phone]   04/04/2017, 11:21 AM   Recent Labs  Lab 04/02/17 0031 04/04/17 0405  NA 137 132*  K 4.4 4.4  CL 99* 94*  CO2 25 25  GLUCOSE 84 81  BUN 34* 27*  CREATININE 8.44* 8.30*  CALCIUM 8.8* 9.1   No results  for input(s): AST, ALT, ALKPHOS, BILITOT, PROT, ALBUMIN in the last 168 hours. Recent Labs  Lab 04/02/17 0031 04/03/17 0521 04/04/17 0405  WBC 9.4 11.0* 9.2  NEUTROABS 6.1  --   --   HGB 9.8* 10.9* 10.3*  HCT 31.6* 35.5* 33.6*  MCV 94.0 94.7 94.1  PLT 240 276 255   Iron/TIBC/Ferritin/ %Sat    Component Value Date/Time   IRON 51 03/17/2017 0249   TIBC 133 (L) 03/17/2017 0249   FERRITIN 4,680 (H) 03/17/2017 0249   IRONPCTSAT 38 (H) 03/17/2017 0249

## 2017-04-04 NOTE — Progress Notes (Signed)
ANTICOAGULATION CONSULT NOTE - Follow-Up Consult  Pharmacy Consult for Heparin/Coumadin Indication: H/O PE, thrombosed R AVF  No Known Allergies  Patient Measurements: Height: 5\' 6"  (167.6 cm) Weight: 157 lb 6.5 oz (71.4 kg) IBW/kg (Calculated) : 59.3   Vital Signs: Temp: 97.4 F (36.3 C) (11/17 1210) Temp Source: Oral (11/17 1210) BP: 121/71 (11/17 1210) Pulse Rate: 88 (11/17 1210)  Labs: Recent Labs    04/02/17 0031 04/02/17 0050 04/02/17 1208 04/03/17 0521 04/04/17 0405  HGB 9.8*  --   --  10.9* 10.3*  HCT 31.6*  --   --  35.5* 33.6*  PLT 240  --   --  276 255  LABPROT  --  21.4*  --  27.0* 37.6*  INR  --  1.87  --  2.52 3.86  HEPARINUNFRC  --   --  0.31 0.40  --   CREATININE 8.44*  --   --   --  8.30*    Estimated Creatinine Clearance: 9.2 mL/min (A) (by C-G formula based on SCr of 8.3 mg/dL (H)).  Assessment: 41 y.o. female with ESRD who presented on 11/15 with worsening RUE pain and swelling found to have partially thrombosed R-AVF. The patient was already on warfarin PTA for hx PE. Admit INR 1.87 on PTA dose - Heparin bridge was started.   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 Hopeful to avoid surgical interventions - treating with warm compress, VVS okayed continuation of anticoagulation.   INR this morning is SUPRAtherapeutic despite resuming home dose (INR 3.86 << 2.52, goal of 2-3). CBC stable - no bleeding noted.   Goal of Therapy:  INR 2-3 Monitor platelets by anticoagulation protocol: Yes   Plan:  1. Hold warfarin dose today 2. Will continue to monitor for any signs/symptoms of bleeding and will follow up with PT/INR in the a.m.  Thank you for allowing pharmacy to be a part of this patient's care.  Alycia Rossetti, PharmD, BCPS Clinical Pharmacist Pager: [phone] Clinical phone for 04/04/2017 from 7a-3:30p: [phone] If after 3:30p, please call main pharmacy at: [phone] 04/04/2017 1:56 PM

## 2017-04-04 NOTE — Discharge Summary (Signed)
Physician Discharge Summary   Patient ID: Maria Vazquez MRN: [id number] DOB/AGE: 1975/08/05 41 y.o.  Admit date: 04/01/2017 Discharge date: 04/04/2017  Primary Care Physician:  Estanislado Emms, MD  Discharge Diagnoses:    . Clotted dialysis access [facility]) . Bilateral pulmonary embolism (HCC) ESRD on hemodialysis Hypertension Thrombophlebitis in the setting of thrombosed right upper extremity fistula  Consults: Nephrology Vascular surgery  Recommendations for Outpatient Follow-up:  1. Patient was recommended to continue warfarin, warm compresses and arm elevation, may need surgical management in future 2. Please repeat CBC/BMET at next visit   DIET: Renal diet    Allergies:  No Known Allergies   DISCHARGE MEDICATIONS: Current Discharge Medication List    START taking these medications   Details  acetaminophen-codeine (TYLENOL #3) 300-30 MG tablet Take 1 tablet every 6 (six) hours as needed by mouth for moderate pain or severe pain. Qty: 20 tablet, Refills: 0    hydrALAZINE (APRESOLINE) 10 MG tablet Take 1 tablet (10 mg total) 3 (three) times daily by mouth. Qty: 90 tablet, Refills: 3    ondansetron (ZOFRAN ODT) 4 MG disintegrating tablet Take 1 tablet (4 mg total) every 8 (eight) hours as needed by mouth for nausea or vomiting. Qty: 20 tablet, Refills: 0      CONTINUE these medications which have CHANGED   Details  warfarin (COUMADIN) 5 MG tablet Take 0.5 tablets (2.5 mg total) every evening by mouth. Qty: 20 tablet, Refills: 0      CONTINUE these medications which have NOT CHANGED   Details  acetaminophen (TYLENOL) 500 MG tablet Take 500 mg by mouth every 6 (six) hours as needed for mild pain or moderate pain.    calcium acetate (PHOSLO) 667 MG capsule Take 1,334-2,001 mg See admin instructions by mouth. 2,001 mg three times a day with meals and 1,334 mg with snacks    ferric citrate (AURYXIA) 1 GM 210 MG(Fe) tablet Take 420-630 mg See admin  instructions by mouth. 420 mg on Sun/Mon/Wed/Fri and 630 mg on Tues/Thurs/Sat    hydroxychloroquine (PLAQUENIL) 200 MG tablet Take 1 tablet (200 mg total) by mouth daily. Qty: 30 tablet, Refills: 0    levETIRAcetam (KEPPRA) 500 MG tablet Take 500 mg See admin instructions by mouth. 500 mg two times a day and an additional 500 mg on Tues/Thurs/Sat after dialysis    nortriptyline (PAMELOR) 50 MG capsule Take 50 mg by mouth daily.         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  Patient known to have an occlusive thrombus on her right upper extremity fistula.  Previously attempted declot with PTA but unsuccessful, she was started on Coumadin.  Patient admitted with working diagnosis of thrombosed fistula and pain management.  Vascular surgery was consulted.   Hospital Course:     Clotted dialysis access [facility]), thrombophlebitis in the setting of thrombosed right upper extremity fistula -Vascular surgery was consulted, recommended treatment for thrombophlebitis, warm compresses and arm elevation, may need surgical management in future -Patient is currently on Coumadin, will continue, INR therapeutic  -Continue pain control     ESRD (end stage renal disease) on dialysis ([place]) -On hemodialysis, TTS, nephrology following, patient to be discharged after the hemodialysis  Bilateral pulmonary embolism (HCC) -Patient was continued on heparin bridge with Coumadin, INR therapeutic.  -Follow-up repeat PT/INR in 1 week  Nausea and vomiting -Possibly due to medication side effect versus uremia versus hypertension, currently resolved, tolerating diet  Hypertension -Patient was started on hydralazine 10 mg  3 times daily, follow outpatient closely  Day of Discharge BP 134/79   Pulse 89   Temp 98.3 F (36.8 C) (Oral)   Resp 18   Ht 5\' 6"  (1.676 m)   Wt 73.8 kg (162 lb 11.2 oz)   SpO2 99%   BMI 26.26 kg/m   Physical Exam: General: Alert and awake oriented x3 not in any acute distress. HEENT: anicteric sclera, pupils reactive to light and accommodation CVS: S1-S2 clear no murmur rubs or gallops Chest: clear to auscultation bilaterally, no wheezing rales or rhonchi Abdomen: soft nontender, nondistended, normal bowel sounds Extremities: no cyanosis, clubbing or edema noted bilaterally.  Right upper extremity edematous, mild erythema, tender Neuro: Cranial nerves II-XII intact, no focal neurological deficits   The results of significant diagnostics from this hospitalization (including imaging, microbiology, ancillary and laboratory) are listed below for reference.    LAB RESULTS: Basic Metabolic Panel: Recent Labs  Lab 04/02/17 0031 04/04/17 0405  NA 137 132*  K 4.4 4.4  CL 99* 94*  CO2 25 25  GLUCOSE 84 81  BUN 34* 27*  CREATININE 8.44* 8.30*  CALCIUM 8.8* 9.1   Liver Function Tests: No results for input(s): AST, ALT, ALKPHOS, BILITOT, PROT, ALBUMIN in the last 168 hours. No results for input(s): LIPASE, AMYLASE in the last 168 hours. No results for input(s): AMMONIA in the last 168 hours. CBC: Recent Labs  Lab 04/02/17 0031 04/03/17 0521 04/04/17 0405  WBC 9.4 11.0* 9.2  NEUTROABS 6.1  --   --   HGB 9.8* 10.9* 10.3*  HCT 31.6* 35.5* 33.6*  MCV 94.0 94.7 94.1  PLT 240 276 255   Cardiac Enzymes: No results for input(s): CKTOTAL, CKMB, CKMBINDEX, TROPONINI in the last 168 hours. BNP: Invalid input(s): POCBNP CBG: No results for input(s): GLUCAP in the last 168 hours.  Significant Diagnostic Studies:  Ct Angio Up Extrem Right W &/or Wo Contrast  Result Date: 04/03/2017 CLINICAL DATA:  Right arm AV fistula thrombosis EXAM: CT ANGIOGRAPHY OF THE RIGHT  UPPEREXTREMITY TECHNIQUE: Multidetector CT imaging of the right upperwas performed using the standard protocol during bolus administration of intravenous contrast. Multiplanar CT image reconstructions and MIPs were obtained to evaluate the vascular anatomy. CONTRAST:  113mL ISOVUE-370 IOPAMIDOL (ISOVUE-370) INJECTION 76% COMPARISON:  None. FINDINGS: The ascending aorta is partially visualized. It is grossly patent. There is no obvious dissection. Maximal diameter is 3.0 cm. Innominate artery is patent. Visualized portions of the right common carotid and vertebral arteries are patent. Right subclavian and axillary arteries are patent. Early branching of the ulnar artery occurs beyond the axillary artery. The ulnar artery is patent to the wrist. The interosseous artery is also patent emanating from the ulnar The brachial artery emanates from the axillary artery. The brachial artery eventually continues on as the radial artery in the forearm. There is a large dilated superficial and tubular structure in the medial soft tissues of the right arm leading to the cephalic subclavian junction. It is predominately low-density likely due to thrombosis. There is a predominately low-density tubular structure in the distal arm. This is most consistent with the outflow vein from an AV fistula which is thrombosed The peripheral aspect of the suspected thrombosed vein  is somewhat higher density and partially calcified. There may be some flow for contrast into the peripheral aspect of the outflow vein at adjacent to the arteriovenous anastomosis. This area measures 2.7 cm in diameter. The higher density may represent more acute thrombosis. At the suspected arteriovenous fistula adjacent to the antecubital fossa, the brachial artery is somewhat narrowed but fine detail is obscured. There is also a focal calcified aneurysm emanating from the brachial artery at the anastomosis measuring 1.7 cm. Beyond the arteriovenous anastomosis, the  radial artery is diminutive and patent. Review of the MIP images confirms the above findings. IMPRESSION: The above findings are most consistent with a right antecubital fossa arteriovenous fistula with the outflow through the cephalic vein. The outflow vein is suspected to be thrombosed, however this should be confirmed with dedicated arteriovenous fistula angiography or ultrasound. There may be some flow at the peripheral aspect, adjacent to the arteriovenous anastomosis, characterized as somewhat higher density within the vessel lumen. Alternatively, this area may represent more acute thrombosis. 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 There is narrowing of the brachial artery, at the antecubital arteriovenous anastomosis but fine detail is obscured. Electronically Signed   By: Marybelle Killings M.D.   On: 04/03/2017 07:52    2D ECHO:   Disposition and Follow-up: Discharge Instructions    Diet - low sodium heart healthy   Complete by:  As directed    Discharge instructions   Complete by:  As directed    Please continue Coumadin 2.5 mg daily.  Continue to keep your right arm elevated, warm compresses and pain medication as needed.   Increase activity slowly   Complete by:  As directed        DISPOSITION: Home   DISCHARGE FOLLOW-UP Follow-up Information    Estanislado Emms, MD. Schedule an appointment as soon as possible for a visit in 2 week(s).   Specialty:  Nephrology Contact information: [place] [address] [phone]        Waynetta Sandy, MD. Schedule an appointment as soon as possible for a visit in 2 week(s).   Specialties:  Vascular Surgery, Cardiology Contact information: [address] [place] [address] [phone]            Time spent on Discharge: 74mins   Signed:   Estill Cotta M.D. [facility] 04/04/2017, 12:25  PM Pager: [phone]

## 2017-04-06 ENCOUNTER — Ambulatory Visit (INDEPENDENT_AMBULATORY_CARE_PROVIDER_SITE_OTHER): Payer: Self-pay | Admitting: *Deleted

## 2017-04-06 DIAGNOSIS — Z5181 Encounter for therapeutic drug level monitoring: Secondary | ICD-10-CM

## 2017-04-06 DIAGNOSIS — I2699 Other pulmonary embolism without acute cor pulmonale: Secondary | ICD-10-CM

## 2017-04-06 LAB — POCT INR: INR: 2.5

## 2017-04-07 ENCOUNTER — Telehealth: Payer: Self-pay | Admitting: *Deleted

## 2017-04-07 NOTE — Telephone Encounter (Signed)
-----   Message from Malen Gauze, RN sent at 04/06/2017  5:00 PM EST ----- Take last dose of coumadin 11/24 Change INR appt from 11/26 to 11/28 at 7:45am Restart coumadin 1/2 tablet daily after procedure Check INR on 12/5  Dr Raliegh Ip @ 2pm  And me @ 2:30pm  ----- Message ----- From: Elam Dutch, MD Sent: 04/06/2017   4:07 PM To: Malen Gauze, RN  Stop 3 days prior.  INR day of procedure  Thanks  Ruta Hinds ----- Message ----- From: Malen Gauze, RN Sent: 04/06/2017   2:57 PM To: Elam Dutch, MD  Pt scheduled for fistula creation on 11/28.  Do you want her to hold her coumadin? Thanks

## 2017-04-07 NOTE — Telephone Encounter (Signed)
Spoke with interpreter in pt's home and she gave pt instructions to take last dose of coumadin on 11/24 and come for INR check on 11/28 at 7:45am.

## 2017-04-14 ENCOUNTER — Encounter (HOSPITAL_COMMUNITY): Payer: Self-pay | Admitting: *Deleted

## 2017-04-14 ENCOUNTER — Other Ambulatory Visit: Payer: Self-pay

## 2017-04-14 NOTE — Progress Notes (Signed)
I called [facility], patient is currently there.  I called by with  Interpreter , Verdis Frederickson [phone] to schedule an appointment to call patient and do SDW call.  We scheduled a 4 pm call.

## 2017-04-15 ENCOUNTER — Ambulatory Visit (HOSPITAL_COMMUNITY): Payer: Self-pay | Admitting: Certified Registered Nurse Anesthetist

## 2017-04-15 ENCOUNTER — Encounter (HOSPITAL_COMMUNITY): Payer: Self-pay | Admitting: Certified Registered Nurse Anesthetist

## 2017-04-15 ENCOUNTER — Telehealth: Payer: Self-pay | Admitting: Vascular Surgery

## 2017-04-15 ENCOUNTER — Other Ambulatory Visit: Payer: Self-pay

## 2017-04-15 ENCOUNTER — Ambulatory Visit (HOSPITAL_COMMUNITY)
Admission: RE | Admit: 2017-04-15 | Discharge: 2017-04-15 | Disposition: A | Discharge status: Home / Self Care | Payer: Self-pay | Source: Ambulatory Visit | Attending: Vascular Surgery | Admitting: Vascular Surgery

## 2017-04-15 ENCOUNTER — Encounter (HOSPITAL_COMMUNITY)
Admission: RE | Disposition: A | Discharge status: Home / Self Care | Payer: Self-pay | Source: Ambulatory Visit | Attending: Vascular Surgery

## 2017-04-15 ENCOUNTER — Encounter: Payer: Self-pay | Admitting: *Deleted

## 2017-04-15 DIAGNOSIS — I878 Other specified disorders of veins: Secondary | ICD-10-CM | POA: Insufficient documentation

## 2017-04-15 DIAGNOSIS — G40909 Epilepsy, unspecified, not intractable, without status epilepticus: Secondary | ICD-10-CM | POA: Insufficient documentation

## 2017-04-15 DIAGNOSIS — M3214 Glomerular disease in systemic lupus erythematosus: Secondary | ICD-10-CM | POA: Insufficient documentation

## 2017-04-15 DIAGNOSIS — Z992 Dependence on renal dialysis: Secondary | ICD-10-CM | POA: Insufficient documentation

## 2017-04-15 DIAGNOSIS — Z79899 Other long term (current) drug therapy: Secondary | ICD-10-CM | POA: Insufficient documentation

## 2017-04-15 DIAGNOSIS — Y832 Surgical operation with anastomosis, bypass or graft as the cause of abnormal reaction of the patient, or of later complication, without mention of misadventure at the time of the procedure: Secondary | ICD-10-CM | POA: Insufficient documentation

## 2017-04-15 DIAGNOSIS — N185 Chronic kidney disease, stage 5: Secondary | ICD-10-CM

## 2017-04-15 DIAGNOSIS — T82868A Thrombosis of vascular prosthetic devices, implants and grafts, initial encounter: Secondary | ICD-10-CM | POA: Insufficient documentation

## 2017-04-15 DIAGNOSIS — Z86711 Personal history of pulmonary embolism: Secondary | ICD-10-CM | POA: Insufficient documentation

## 2017-04-15 DIAGNOSIS — N186 End stage renal disease: Secondary | ICD-10-CM | POA: Insufficient documentation

## 2017-04-15 DIAGNOSIS — Z7901 Long term (current) use of anticoagulants: Secondary | ICD-10-CM | POA: Insufficient documentation

## 2017-04-15 DIAGNOSIS — Z8673 Personal history of transient ischemic attack (TIA), and cerebral infarction without residual deficits: Secondary | ICD-10-CM | POA: Insufficient documentation

## 2017-04-15 DIAGNOSIS — I110 Hypertensive heart disease with heart failure: Secondary | ICD-10-CM | POA: Insufficient documentation

## 2017-04-15 DIAGNOSIS — I509 Heart failure, unspecified: Secondary | ICD-10-CM | POA: Insufficient documentation

## 2017-04-15 HISTORY — PX: AV FISTULA PLACEMENT: SHX1204

## 2017-04-15 HISTORY — DX: Cerebral infarction, unspecified: I63.9

## 2017-04-15 LAB — HCG, SERUM, QUALITATIVE: PREG SERUM: NEGATIVE

## 2017-04-15 LAB — POCT I-STAT 4, (NA,K, GLUC, HGB,HCT)
Glucose, Bld: 83 mg/dL (ref 65–99)
HCT: 33 % — ABNORMAL LOW (ref 36.0–46.0)
Hemoglobin: 11.2 g/dL — ABNORMAL LOW (ref 12.0–15.0)
Potassium: 5.6 mmol/L — ABNORMAL HIGH (ref 3.5–5.1)
Sodium: 133 mmol/L — ABNORMAL LOW (ref 135–145)

## 2017-04-15 LAB — PROTIME-INR
INR: 1.04
PROTHROMBIN TIME: 13.5 s (ref 11.4–15.2)

## 2017-04-15 SURGERY — ARTERIOVENOUS (AV) FISTULA CREATION
Anesthesia: Monitor Anesthesia Care | Site: Arm Upper | Laterality: Left

## 2017-04-15 MED ORDER — OXYCODONE-ACETAMINOPHEN 5-325 MG PO TABS
ORAL_TABLET | ORAL | Status: AC
  Filled 2017-04-15: qty 2

## 2017-04-15 MED ORDER — LIDOCAINE HCL (PF) 1 % IJ SOLN
INTRAMUSCULAR | Status: AC
  Filled 2017-04-15: qty 30

## 2017-04-15 MED ORDER — FENTANYL CITRATE (PF) 100 MCG/2ML IJ SOLN
INTRAMUSCULAR | Status: DC | PRN
  Administered 2017-04-15 (×3): 25 ug via INTRAVENOUS
  Administered 2017-04-15 (×2): 50 ug via INTRAVENOUS
  Administered 2017-04-15: 25 ug via INTRAVENOUS

## 2017-04-15 MED ORDER — 0.9 % SODIUM CHLORIDE (POUR BTL) OPTIME
TOPICAL | Status: DC | PRN
  Administered 2017-04-15: 1000 mL

## 2017-04-15 MED ORDER — PHENYLEPHRINE HCL 10 MG/ML IJ SOLN
INTRAMUSCULAR | Status: DC | PRN
  Administered 2017-04-15: 30 ug/min via INTRAVENOUS

## 2017-04-15 MED ORDER — SODIUM CHLORIDE 0.9 % IV SOLN
INTRAVENOUS | Status: DC | PRN
  Administered 2017-04-15: 11:00:00 500 mL

## 2017-04-15 MED ORDER — DEXTROSE 5 % IV SOLN
1.5000 g | INTRAVENOUS | Status: AC
  Administered 2017-04-15: 1.5 g via INTRAVENOUS
  Filled 2017-04-15: qty 1.5

## 2017-04-15 MED ORDER — LIDOCAINE HCL (PF) 1 % IJ SOLN
INTRAMUSCULAR | Status: DC | PRN
  Administered 2017-04-15: 7 mL via INTRADERMAL

## 2017-04-15 MED ORDER — MIDAZOLAM HCL 2 MG/2ML IJ SOLN
INTRAMUSCULAR | Status: DC | PRN
  Administered 2017-04-15 (×2): 1 mg via INTRAVENOUS

## 2017-04-15 MED ORDER — OXYCODONE-ACETAMINOPHEN 5-325 MG PO TABS
1.0000 | ORAL_TABLET | Freq: Once | ORAL | Status: AC
  Administered 2017-04-15: 2 via ORAL

## 2017-04-15 MED ORDER — ONDANSETRON HCL 4 MG/2ML IJ SOLN: 4.0000 mg | Freq: Once | INTRAMUSCULAR | Status: DC | PRN

## 2017-04-15 MED ORDER — DEXAMETHASONE SODIUM PHOSPHATE 10 MG/ML IJ SOLN
INTRAMUSCULAR | Status: DC | PRN
  Administered 2017-04-15: 4 mg via INTRAVENOUS

## 2017-04-15 MED ORDER — SODIUM CHLORIDE 0.9 % IV SOLN: INTRAVENOUS | Status: DC

## 2017-04-15 MED ORDER — PROPOFOL 500 MG/50ML IV EMUL
INTRAVENOUS | Status: DC | PRN
  Administered 2017-04-15: 50 ug/kg/min via INTRAVENOUS

## 2017-04-15 MED ORDER — FENTANYL CITRATE (PF) 250 MCG/5ML IJ SOLN
INTRAMUSCULAR | Status: AC
  Filled 2017-04-15: qty 5

## 2017-04-15 MED ORDER — MIDAZOLAM HCL 2 MG/2ML IJ SOLN
INTRAMUSCULAR | Status: AC
  Filled 2017-04-15: qty 2

## 2017-04-15 MED ORDER — OXYCODONE-ACETAMINOPHEN 5-325 MG PO TABS: 1.0000 | ORAL_TABLET | Freq: Four times a day (QID) | ORAL | 0 refills | Status: AC | PRN

## 2017-04-15 MED ORDER — SODIUM CHLORIDE 0.9 % IV SOLN
INTRAVENOUS | Status: DC
  Administered 2017-04-15: 09:00:00 via INTRAVENOUS

## 2017-04-15 MED ORDER — PROPOFOL 10 MG/ML IV BOLUS
INTRAVENOUS | Status: AC
  Filled 2017-04-15: qty 20

## 2017-04-15 MED ORDER — FENTANYL CITRATE (PF) 100 MCG/2ML IJ SOLN: 25.0000 ug | INTRAMUSCULAR | Status: DC | PRN

## 2017-04-15 MED ORDER — ONDANSETRON HCL 4 MG/2ML IJ SOLN
INTRAMUSCULAR | Status: DC | PRN
  Administered 2017-04-15: 4 mg via INTRAVENOUS

## 2017-04-15 MED ORDER — MIDAZOLAM HCL 10 MG/2ML IJ SOLN
INTRAMUSCULAR | Status: AC
  Filled 2017-04-15: qty 2

## 2017-04-15 SURGICAL SUPPLY — 37 items
ADH SKN CLS APL DERMABOND .7 (GAUZE/BANDAGES/DRESSINGS) ×1
ADH SKN CLS LQ APL DERMABOND (GAUZE/BANDAGES/DRESSINGS) ×1
AGENT HMST SPONGE THK3/8 (HEMOSTASIS)
ARMBAND PINK RESTRICT EXTREMIT (MISCELLANEOUS) ×6 IMPLANT
CANISTER SUCT 3000ML PPV (MISCELLANEOUS) ×3 IMPLANT
CATH EMB 2FR 60CM (CATHETERS) ×2 IMPLANT
CLIP VESOCCLUDE MED 6/CT (CLIP) ×3 IMPLANT
CLIP VESOCCLUDE SM WIDE 6/CT (CLIP) ×3 IMPLANT
COVER PROBE W GEL 5X96 (DRAPES) ×3 IMPLANT
DECANTER SPIKE VIAL GLASS SM (MISCELLANEOUS) ×3 IMPLANT
DERMABOND ADHESIVE PROPEN (GAUZE/BANDAGES/DRESSINGS) ×2
DERMABOND ADVANCED (GAUZE/BANDAGES/DRESSINGS) ×2
DERMABOND ADVANCED .7 DNX12 (GAUZE/BANDAGES/DRESSINGS) ×1 IMPLANT
DERMABOND ADVANCED .7 DNX6 (GAUZE/BANDAGES/DRESSINGS) IMPLANT
ELECT CAUTERY BLADE 6.4 (BLADE) ×2 IMPLANT
ELECT REM PT RETURN 9FT ADLT (ELECTROSURGICAL) ×3
ELECTRODE REM PT RTRN 9FT ADLT (ELECTROSURGICAL) ×1 IMPLANT
GLOVE BIO SURGEON STRL SZ7 (GLOVE) ×3 IMPLANT
GLOVE BIOGEL PI IND STRL 7.5 (GLOVE) ×1 IMPLANT
GLOVE BIOGEL PI INDICATOR 7.5 (GLOVE) ×2
GOWN STRL REUS W/ TWL LRG LVL3 (GOWN DISPOSABLE) ×3 IMPLANT
GOWN STRL REUS W/TWL LRG LVL3 (GOWN DISPOSABLE) ×9
HEMOSTAT SPONGE AVITENE ULTRA (HEMOSTASIS) IMPLANT
KIT BASIN OR (CUSTOM PROCEDURE TRAY) ×3 IMPLANT
KIT ROOM TURNOVER OR (KITS) ×3 IMPLANT
NS IRRIG 1000ML POUR BTL (IV SOLUTION) ×3 IMPLANT
PACK CV ACCESS (CUSTOM PROCEDURE TRAY) ×3 IMPLANT
PAD ARMBOARD 7.5X6 YLW CONV (MISCELLANEOUS) ×6 IMPLANT
SUT MNCRL AB 4-0 PS2 18 (SUTURE) ×3 IMPLANT
SUT PROLENE 6 0 BV (SUTURE) IMPLANT
SUT PROLENE 7 0 BV 1 (SUTURE) ×9 IMPLANT
SUT VIC AB 3-0 SH 27 (SUTURE) ×3
SUT VIC AB 3-0 SH 27X BRD (SUTURE) ×1 IMPLANT
SYR BULB IRRIGATION 50ML (SYRINGE) ×2 IMPLANT
SYR TB 1ML LUER SLIP (SYRINGE) ×2 IMPLANT
UNDERPAD 30X30 (UNDERPADS AND DIAPERS) ×3 IMPLANT
WATER STERILE IRR 1000ML POUR (IV SOLUTION) ×3 IMPLANT

## 2017-04-15 NOTE — Telephone Encounter (Signed)
-----   Message from Mena Goes, RN sent at 04/15/2017 12:59 PM EST ----- Regarding: 6 weeks no lab   ----- Message ----- From: Iline Oven Sent: 04/15/2017  12:30 PM To: [facility] Charge Pool  Can you schedule an appt for this pt with Dr. Bridgett Larsson in about 6 weeks.  PO L brachiocephalic. Thanks, [facility]

## 2017-04-15 NOTE — Anesthesia Preprocedure Evaluation (Addendum)
Anesthesia Evaluation  Patient identified by MRN, date of birth, ID band Patient awake    Reviewed: Allergy & Precautions, H&P , NPO status , Patient's Chart, lab work & pertinent test results  Airway Mallampati: II  TM Distance: >3 FB Neck ROM: full    Dental  (+) Dental Advisory Given   Pulmonary shortness of breath,    Pulmonary exam normal breath sounds clear to auscultation       Cardiovascular hypertension, Pt. on medications +CHF  Normal cardiovascular exam Rhythm:Regular Rate:Normal  Echo 2018 -  Left ventricle: The cavity size was normal. Systolic function was  normal. The estimated ejection fraction was in the range of 50% to 55%. Possible mild hypokinesis of the anteroseptal myocardium.   Neuro/Psych Seizures -,  CVA, No Residual Symptoms negative psych ROS   GI/Hepatic negative GI ROS, Neg liver ROS,   Endo/Other  negative endocrine ROS  Renal/GU ESRF and DialysisRenal disease  negative genitourinary   Musculoskeletal negative musculoskeletal ROS (+)   Abdominal   Peds  Hematology  (+) anemia ,   Anesthesia Other Findings Lupus  Reproductive/Obstetrics                            Anesthesia Physical  Anesthesia Plan  ASA: III  Anesthesia Plan: MAC   Post-op Pain Management:    Induction: Intravenous  PONV Risk Score and Plan: Propofol infusion and Treatment may vary due to age or medical condition  Airway Management Planned: Simple Face Mask  Additional Equipment: None  Intra-op Plan:   Post-operative Plan:   Informed Consent: I have reviewed the patients History and Physical, chart, labs and discussed the procedure including the risks, benefits and alternatives for the proposed anesthesia with the patient or authorized representative who has indicated his/her understanding and acceptance.   Dental advisory given  Plan Discussed with: CRNA  Anesthesia Plan  Comments:         Anesthesia Quick Evaluation

## 2017-04-15 NOTE — Discharge Instructions (Signed)
° °  [facility] [place] ° °Discharge Instructions ° °AV Fistula or Graft Surgery for Dialysis Access ° °Please refer to the following instructions for your post-procedure care. Your surgeon or physician assistant will discuss any changes with you. ° °Activity ° °You may drive the day following your surgery, if you are comfortable and no longer taking prescription pain medication. Resume full activity as the soreness in your incision resolves. ° °Bathing/Showering ° °You may shower after you go home. Keep your incision dry for 48 hours. Do not soak in a bathtub, hot tub, or swim until the incision heals completely. You may not shower if you have a hemodialysis catheter. ° °Incision Care ° °Clean your incision with mild soap and water after 48 hours. Pat the area dry with a clean towel. You do not need a bandage unless otherwise instructed. Do not apply any ointments or creams to your incision. You may have skin glue on your incision. Do not peel it off. It will come off on its own in about one week. Your arm may swell a bit after surgery. To reduce swelling use pillows to elevate your arm so it is above your heart. Your doctor will tell you if you need to lightly wrap your arm with an ACE bandage. ° °Diet ° °Resume your normal diet. There are not special food restrictions following this procedure. In order to heal from your surgery, it is CRITICAL to get adequate nutrition. Your body requires vitamins, minerals, and protein. Vegetables are the best source of vitamins and minerals. Vegetables also provide the perfect balance of protein. Processed food has little nutritional value, so try to avoid this. ° °Medications ° °Resume taking all of your medications. If your incision is causing pain, you may take over-the counter pain relievers such as acetaminophen (Tylenol). If you were prescribed a stronger pain medication, please be aware these medications can cause nausea and constipation. Prevent  nausea by taking the medication with a snack or meal. Avoid constipation by drinking plenty of fluids and eating foods with high amount of fiber, such as fruits, vegetables, and grains. Do not take Tylenol if you are taking prescription pain medications. ° ° ° ° °Follow up °Your surgeon may want to see you in the office following your access surgery. If so, this will be arranged at the time of your surgery. ° °Please call us immediately for any of the following conditions: ° °Increased pain, redness, drainage (pus) from your incision site °Fever of 101 degrees or higher °Severe or worsening pain at your incision site °Hand pain or numbness. ° °Reduce your risk of vascular disease: ° °Stop smoking. If you would like help, call [facility] at 1-800-QUIT-NOW (1-800-784-8669) or [place] at 336-586-4000 ° °Manage your cholesterol °Maintain a desired weight °Control your diabetes °Keep your blood pressure down ° °Dialysis ° °It will take several weeks to several months for your new dialysis access to be ready for use. Your surgeon will determine when it is OK to use it. Your nephrologist will continue to direct your dialysis. You can continue to use your Permcath until your new access is ready for use. ° °If you have any questions, please call the office at [phone]. ° °

## 2017-04-15 NOTE — Interval H&P Note (Signed)
   History and Physical Update  The patient was interviewed and re-examined.  The patient's previous History and Physical has been reviewed and is unchanged from Dr. Claretha Cooper consult except for: interval occlusion of R BC AVF.  The patient presents today for L arm AVF placement.   Risk, benefits, and alternatives to access surgery were discussed.    The patient is aware the risks include but are not limited to: bleeding, infection, steal syndrome, nerve damage, ischemic monomelic neuropathy, thrombosis, failure to mature, complications related to venous hypertension, need for additional procedures, death and stroke.    The patient agrees to proceed forward with the procedure.  Adele Barthel, MD, FACS [facility] [place] Office: [phone] Pager: [phone]  04/15/2017, 10:36 AM

## 2017-04-15 NOTE — Op Note (Signed)
OPERATIVE NOTE   PROCEDURE: left brachiocephalic arteriovenous fistula placement  PRE-OPERATIVE DIAGNOSIS: end stage renal disease   POST-OPERATIVE DIAGNOSIS: same as above   SURGEON: Adele Barthel, MD  ASSISTANT(S): Joanette Gula, PAC  ANESTHESIA: local and MAC  ESTIMATED BLOOD LOSS: 30 cc  FINDING(S): 1.  Cephalic vein: 3-4 mm, somewhat sclerotic 2.  Brachial artery: 3 mm, mildly diseased 3.  Venous outflow: minimal thrill with pulsatile character superimposed on continuous waveform thrill  4.  Radial flow: dopplerable radial signal  SPECIMEN(S):  none  INDICATIONS:   Maria Vazquez is a 41 y.o. female who presents with end stage renal disease.  The patient is scheduled for left radiocephalic arteriovenous fistula.  The patient expressed desire for left brachiocephalic arteriovenous fistula placement.  The patient is aware the risks include but are not limited to: bleeding, infection, steal syndrome, nerve damage, ischemic monomelic neuropathy, failure to mature, and need for additional procedures.  The patient is aware of the risks of the procedure and elects to proceed forward.   DESCRIPTION: After full informed written consent was obtained from the patient, the patient was brought back to the operating room and placed supine upon the operating table.  Prior to induction, the patient received IV antibiotics.   After obtaining adequate anesthesia, the patient was then prepped and draped in the standard fashion for a left arm access procedure.  I turned my attention first to identifying the patient's cephalic vein and brachial artery.  Using SonoSite guidance, the location of these vessels were marked out on the skin.  Of note, there might be a high brachial artery bifurcation but there was only one pulsatile artery on Sonosite.  At this point, I injected local anesthetic to obtain a field block of the antecubitum.  In total, I injected about 10 mL of 1% lidocaine  without epinephrine.  I made a transverse incision at the level of the antecubitum and dissected through the subcutaneous tissue and fascia to gain exposure of the brachial artery.  This was noted to be 3 mm in diameter externally.  This was dissected out proximally and distally and controlled with vessel loops .  I then dissected out the cephalic vein.  This was noted to be 3-4 mm in diameter externally.  The distal segment of the vein was ligated with a  2-0 silk, and the vein was transected.  The proximal segment was interrogated with serial dilators.  The vein accepted up to a 3.5 mm dilator without any difficulty.  I then instilled the heparinized saline into the vein and clamped it.  At this point, I reset my exposure of the brachial artery and placed the artery under tension proximally and distally.  I made an arteriotomy with a #11 blade, and then I extended the arteriotomy with a Potts scissor.  I injected heparinized saline proximal and distal to this arteriotomy.  The vein was then sewn to the artery in an end-to-side configuration with a running stitch of 7-0 Prolene.  Prior to completing this anastomosis, I allowed the vein and artery to backbleed.  There was no evidence of clot from any vessels.  I completed the anastomosis in the usual fashion and then released all vessel loops and clamps.    There was a minimal thrill in the venous outflow with waveform consistent with a patent arteriovenous fistula; however, there was pulsatile character superimposed.  Distally, there was a dopplerable radial signaland dopplerable ulnar artery signal.  At this point, I irrigated out  the surgical wound.  There was no further active bleeding.  The subcutaneous tissue was reapproximated with a running stitch of 3-0 Vicryl.  The skin was then reapproximated with a running subcuticular stitch of 4-0 Vicryl.  The skin was then cleaned, dried, and reinforced with Dermabond.  The patient tolerated this procedure well.     COMPLICATIONS: none  CONDITION: stable   Adele Barthel, MD, [facility] [place] Office: [phone] Pager: [phone]  04/15/2017, 12:11 PM

## 2017-04-15 NOTE — Transfer of Care (Signed)
Immediate Anesthesia Transfer of Care Note  Patient: Maria Vazquez  Procedure(s) Performed: left brachiocephailc ARTERIOVENOUS (AV) FISTULA CREATION (Left Arm Upper)  Patient Location: PACU  Anesthesia Type:MAC  Level of Consciousness: drowsy and patient cooperative  Airway & Oxygen Therapy: Patient Spontanous Breathing and Patient connected to face mask oxygen  Post-op Assessment: Report given to RN, Post -op Vital signs reviewed and stable and Patient moving all extremities X 4  Post vital signs: Reviewed and stable  Last Vitals:  Vitals:   04/15/17 0848 04/15/17 1230  BP: (!) 158/98 (!) 154/93  Pulse:  74  Resp:  16  Temp:  36.5 C  SpO2:  100%    Last Pain:  Vitals:   04/15/17 1230  TempSrc:   PainSc: (P) 0-No pain         Complications: No apparent anesthesia complications

## 2017-04-15 NOTE — Anesthesia Postprocedure Evaluation (Signed)
Anesthesia Post Note  Patient: Maria Vazquez  Procedure(s) Performed: left brachiocephailc ARTERIOVENOUS (AV) FISTULA CREATION (Left Arm Upper)     Patient location during evaluation: PACU Anesthesia Type: MAC Level of consciousness: awake and alert Pain management: pain level controlled Vital Signs Assessment: post-procedure vital signs reviewed and stable Respiratory status: spontaneous breathing, nonlabored ventilation and respiratory function stable Cardiovascular status: stable and blood pressure returned to baseline Anesthetic complications: no    Last Vitals:  Vitals:   04/15/17 1300 04/15/17 1315  BP: (!) 152/96 (!) 153/93  Pulse: 72 72  Resp: 14 13  Temp:    SpO2: 100% 100%    Last Pain:  Vitals:   04/15/17 1230  TempSrc:   PainSc: 0-No pain                 Audry Pili

## 2017-04-15 NOTE — Telephone Encounter (Signed)
Sched appt 05/29/16 at 10:45. Lm on hm#.

## 2017-04-16 ENCOUNTER — Encounter (HOSPITAL_COMMUNITY): Payer: Self-pay | Admitting: Vascular Surgery

## 2017-04-22 ENCOUNTER — Ambulatory Visit (INDEPENDENT_AMBULATORY_CARE_PROVIDER_SITE_OTHER): Payer: Self-pay | Admitting: *Deleted

## 2017-04-22 ENCOUNTER — Ambulatory Visit (INDEPENDENT_AMBULATORY_CARE_PROVIDER_SITE_OTHER): Payer: Self-pay | Admitting: Cardiovascular Disease

## 2017-04-22 ENCOUNTER — Encounter: Payer: Self-pay | Admitting: Cardiovascular Disease

## 2017-04-22 VITALS — BP 135/91 | HR 82 | Ht 66.0 in | Wt 162.2 lb

## 2017-04-22 DIAGNOSIS — M329 Systemic lupus erythematosus, unspecified: Secondary | ICD-10-CM

## 2017-04-22 DIAGNOSIS — I151 Hypertension secondary to other renal disorders: Secondary | ICD-10-CM

## 2017-04-22 DIAGNOSIS — I2699 Other pulmonary embolism without acute cor pulmonale: Secondary | ICD-10-CM

## 2017-04-22 DIAGNOSIS — N2889 Other specified disorders of kidney and ureter: Secondary | ICD-10-CM

## 2017-04-22 DIAGNOSIS — M3214 Glomerular disease in systemic lupus erythematosus: Secondary | ICD-10-CM

## 2017-04-22 DIAGNOSIS — Z9289 Personal history of other medical treatment: Secondary | ICD-10-CM

## 2017-04-22 DIAGNOSIS — Z5181 Encounter for therapeutic drug level monitoring: Secondary | ICD-10-CM

## 2017-04-22 DIAGNOSIS — N186 End stage renal disease: Secondary | ICD-10-CM

## 2017-04-22 LAB — POCT INR: INR: 1

## 2017-04-22 NOTE — Progress Notes (Signed)
SUBJECTIVE: The patient presents to establish care in our [place] office.  She has a history of end-stage renal disease on hemodialysis, lupus, and chronic anemia.  She was evaluated in October 2018 for elevated troponin which was not felt to be due to an acute coronary syndrome.  She did have evidence of pulmonary embolism (acute bilateral segmental and subsegmental pulmonary emboli also involving the lingula, right upper lobe, and bilateral lower lobes).  Echocardiogram 03/14/17 demonstrated normal left ventricular systolic function, LVEF 25-95%, with possible mild hypokinesis of the anteroseptal myocardium.  I reviewed the electronic medical record with respect to her rheumatologic disease, neurologic disease, and recent hospitalization.  She was diagnosed with SLE in September 2012 and follows at [facility].  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  She has a history of a large pericardial effusion.  She follows with Dr. Florene Glen in [place] for her kidney disease.  She does not have a PCP and goes to the free clinic and [facility] as needed.  She has some upper left-sided chest pain particular with deep inspiration and has some mild shortness of breath.  She denies palpitations.  She has some pain in her left arm as she recently underwent brachiocephalic arteriovenous fistula placement on 04/15/17.    Review of Systems: As per "subjective", otherwise negative.  No Known Allergies  Current Outpatient Medications  Medication Sig Dispense Refill  . acetaminophen (TYLENOL) 500 MG tablet Take 500 mg by mouth every 6 (six) hours as needed for mild pain or moderate pain.    . calcium acetate (PHOSLO) 667 MG capsule Take 1,334-2,001 mg See admin instructions by mouth. 2,001 mg three times a day with meals and 1,334 mg with snacks    . ferric citrate (AURYXIA) 1 GM 210 MG(Fe) tablet Take  420-630 mg See admin instructions by mouth. 420 mg on Sun/Mon/Wed/Fri and 630 mg on Tues/Thurs/Sat    . hydroxychloroquine (PLAQUENIL) 200 MG tablet Take 1 tablet (200 mg total) by mouth daily. 30 tablet 0  . levETIRAcetam (KEPPRA) 500 MG tablet Take 500 mg See admin instructions by mouth. 500 mg two times a day and an additional 500 mg on Tues/Thurs/Sat after dialysis    . nortriptyline (PAMELOR) 50 MG capsule Take 50 mg by mouth daily.     No current facility-administered medications for this visit.    Facility-Administered Medications Ordered in Other Visits  Medication Dose Route Frequency Provider Last Rate Last Dose  . 0.9 %  sodium chloride infusion   Intravenous Continuous Monia Sabal, PA-C        Past Medical History:  Diagnosis Date  . Anemia   . Blood transfusion   . Cervix carcinoma in situ Mar 2011  . CHF (congestive heart failure) ([place])   . Chronic kidney disease    T, Th, Sat  . Dialysis patient [facility])    tues, thursday, saturday.  . Gallstone pancreatitis Feb 2011  . Hypertension   . Lupus   . Lupus nephritis ([place])   . Pneumonia due to organism 03/02/2011  . Proteinuria - cause not known   . Renal failure, acute on chronic (HCC)   . Renal insufficiency   . Seizure disorder ([place]) 03/19/2013  . Stroke [facility])    no residual   . Thrombocytopenia ([place])     Past Surgical History:  Procedure Laterality Date  . APPENDECTOMY    . AV FISTULA PLACEMENT    .  AV FISTULA PLACEMENT  07/18/2011   Procedure: ARTERIOVENOUS (AV) FISTULA CREATION;  Surgeon: Angelia Mould, MD;  Location: [place];  Service: Vascular;  Laterality: Right;  . AV FISTULA PLACEMENT  01/23/12   Revision of Right AVF  . AV FISTULA PLACEMENT Left 04/15/2017   Procedure: left brachiocephailc ARTERIOVENOUS (AV) FISTULA CREATION;  Surgeon: Conrad Ravanna, MD;  Location: [place];  Service: Vascular;  Laterality: Left;  . CERVICAL CONE BIOPSY    . CESAREAN SECTION    . CHOLECYSTECTOMY    . EMBOLECTOMY   10/10/2011   Procedure: EMBOLECTOMY;  Surgeon: Angelia Mould, MD;  Location: [place];  Service: Vascular;  Laterality: Right;  . FISTULOGRAM Right 04/12/2012   Procedure: FISTULOGRAM;  Surgeon: Angelia Mould, MD;  Location: [facility] CATH LAB;  Service: Cardiovascular;  Laterality: Right;  . IR FLUORO GUIDE CV LINE LEFT  03/13/2017  . IR THROMBECTOMY AV FISTULA W/THROMBOLYSIS/PTA INC/SHUNT/IMG RIGHT Right 03/13/2017  . IR US GUIDE VASC ACCESS LEFT  03/13/2017  . IR US GUIDE VASC ACCESS RIGHT  03/13/2017  . SHUNTOGRAM Right 07/07/2011   Procedure: SHUNTOGRAM;  Surgeon: Angelia Mould, MD;  Location: [facility] CATH LAB;  Service: Cardiovascular;  Laterality: Right;  . SHUNTOGRAM N/A 12/08/2011   Procedure: Earney Mallet;  Surgeon: Angelia Mould, MD;  Location: [facility] CATH LAB;  Service: Cardiovascular;  Laterality: N/A;    Social History   Socioeconomic History  . Marital status: Married    Spouse name: Not on file  . Number of children: Not on file  . Years of education: Not on file  . Highest education level: Not on file  Social Needs  . Financial resource strain: Not on file  . Food insecurity - worry: Not on file  . Food insecurity - inability: Not on file  . Transportation needs - medical: Not on file  . Transportation needs - non-medical: Not on file  Occupational History  . Not on file  Tobacco Use  . Smoking status: Never Smoker  . Smokeless tobacco: Never Used  Substance and Sexual Activity  . Alcohol use: No  . Drug use: No  . Sexual activity: Yes    Birth control/protection: Condom, None  Other Topics Concern  . Not on file  Social History Narrative  . Not on file     Vitals:   04/22/17 1348  BP: (!) 135/91  Pulse: 82  SpO2: 95%  Weight: 162 lb 3.2 oz (73.6 kg)  Height: 5\' 6"  (1.676 m)    Wt Readings from Last 3 Encounters:  04/22/17 162 lb 3.2 oz (73.6 kg)  04/15/17 151 lb (68.5 kg)  04/04/17 157 lb 6.5 oz (71.4 kg)     PHYSICAL  EXAM General: NAD HEENT: Normal. Neck: No JVD, no thyromegaly. Lungs: Clear to auscultation bilaterally with normal respiratory effort. CV: Regular rate and rhythm, normal S1/S2, no S3/S4, no murmur. No pretibial or periankle edema.  No carotid bruit.   Abdomen: Soft, nontender, no distention.  Neurologic: Alert and oriented.  Psych: Normal affect. Skin: Normal. Musculoskeletal: No gross deformities.    ECG: Most recent ECG reviewed.   Labs: Lab Results  Component Value Date/Time   K 5.6 (H) 04/15/2017 09:15 AM   BUN 27 (H) 04/04/2017 04:05 AM   CREATININE 8.30 (H) 04/04/2017 04:05 AM   ALT 9 (L) 02/14/2017 12:28 PM   TSH 2.315 01/10/2015 04:41 AM   TSH 1.680 01/05/2014 04:55 AM   HGB 11.2 (L) 04/15/2017 09:15 AM  Lipids: Lab Results  Component Value Date/Time   LDLCALC 107 (H) 01/11/2015 04:28 AM   CHOL 186 01/11/2015 04:28 AM   TRIG 127 01/11/2015 04:28 AM   HDL 54 01/11/2015 04:28 AM       ASSESSMENT AND PLAN: 1.  Bilateral pulmonary emboli: She will be established today in our anticoagulation clinic for INR monitoring as she is on warfarin and will likely require this lifelong.  She has some mild inspiratory chest pain and shortness of breath related to this.  Overall symptoms are stable.  2.  Hypertension: She takes hydralazine 10 mg 3 times daily.  3.  End-stage renal disease: She is on hemodialysis and follows with Dr. Florene Glen in [place].  4.  SLE: She follows at [facility] with Dr. Veneta Penton.  She is on Plaquenil.    Disposition: Follow up 6 months  Time spent: 40 minutes, of which greater than 50% was spent reviewing symptoms, relevant blood tests and studies, and discussing management plan with the patient.    Kate Sable, M.D., F.A.C.C.

## 2017-04-22 NOTE — Patient Instructions (Signed)

## 2017-04-29 ENCOUNTER — Encounter: Payer: Self-pay | Admitting: *Deleted

## 2017-05-28 NOTE — Progress Notes (Deleted)
    Postoperative Access Visit   History of Present Illness   Maria Vazquez is a 41 y.o. year old female who presents for postoperative follow-up for: left brachiocephalic arteriovenous fistula (Date: 04/15/17).  The patient's wounds are *** healed.  The patient notes *** steal symptoms.  The patient is *** able to complete their activities of daily living.  The patient's current symptoms are: ***.   Physical Examination  ***There were no vitals filed for this visit. ***There is no height or weight on file to calculate BMI.  left arm Incision is *** healed, skin feels ***, hand grip is ***/5, sensation in digits is *** intact, ***palpable thrill, bruit can *** be auscultated     Medical Decision Making   Maria Vazquez is a 42 y.o. year old female who presents s/p left brachiocephalic arteriovenous fistula   The patient's access is *** ready for use.  ***The patient's tunneled dialysis catheter can be removed after two successful cannulations and completed dialysis treatments.  Thank you for allowing [place] to participate in this patient's care.   Adele Barthel, MD, FACS [facility] [place] Office: [phone] Pager: [phone]

## 2017-05-29 ENCOUNTER — Encounter: Payer: Self-pay | Admitting: Vascular Surgery

## 2017-06-15 NOTE — Progress Notes (Deleted)
    Postoperative Access Visit   History of Present Illness   Maria Vazquez is a 42 y.o. year old female who presents for postoperative follow-up for: left brachiocephalic arteriovenous fistula (Date: 04/15/17).  The patient's wounds are *** healed.  The patient notes *** steal symptoms.  The patient is *** able to complete their activities of daily living.  The patient's current symptoms are: ***.   Physical Examination  ***There were no vitals filed for this visit. ***There is no height or weight on file to calculate BMI.  left arm Incision is *** healed, skin feels ***, hand grip is ***/5, sensation in digits is *** intact, ***palpable thrill, bruit can *** be auscultated     Medical Decision Making   Maria Vazquez is a 42 y.o. year old female who presents s/p left brachiocephalic arteriovenous fistula   The patient's access is *** ready for use.  ***The patient's tunneled dialysis catheter can be removed after two successful cannulations and completed dialysis treatments.  Thank you for allowing [place] to participate in this patient's care.   Adele Barthel, MD, FACS [facility] [place] Office: [phone] Pager: [phone]

## 2017-06-19 ENCOUNTER — Encounter: Payer: Self-pay | Admitting: Vascular Surgery

## 2017-07-13 NOTE — Progress Notes (Deleted)
    Postoperative Access Visit   History of Present Illness   Chrissie Dacquisto is a 42 y.o. year old female who presents for postoperative follow-up for: left brachiocephalic arteriovenous fistula (Date: 04/15/17).  The patient's wounds are *** healed.  The patient notes *** steal symptoms.  The patient is *** able to complete their activities of daily living.  The patient's current symptoms are: ***.   Physical Examination  ***There were no vitals filed for this visit. ***There is no height or weight on file to calculate BMI.  left arm Incision is *** healed, skin feels ***, hand grip is ***/5, sensation in digits is *** intact, ***palpable thrill, bruit can *** be auscultated     Medical Decision Making   Bev Drennen is a 42 y.o. year old female who presents s/p left brachiocephalic arteriovenous fistula   The patient's access is *** ready for use.  ***The patient's tunneled dialysis catheter can be removed after two successful cannulations and completed dialysis treatments.  Thank you for allowing [place] to participate in this patient's care.   Adele Barthel, MD, FACS [facility] [place] Office: [phone] Pager: [phone]

## 2017-07-15 ENCOUNTER — Encounter: Payer: Self-pay | Admitting: Vascular Surgery

## 2017-09-15 ENCOUNTER — Other Ambulatory Visit (HOSPITAL_COMMUNITY): Payer: Self-pay | Admitting: Nephrology

## 2017-09-15 DIAGNOSIS — Z992 Dependence on renal dialysis: Secondary | ICD-10-CM

## 2017-09-21 ENCOUNTER — Ambulatory Visit (HOSPITAL_COMMUNITY)
Admission: RE | Admit: 2017-09-21 | Discharge: 2017-09-21 | Disposition: A | Discharge status: Home / Self Care | Payer: Self-pay | Source: Ambulatory Visit | Attending: Nephrology | Admitting: Nephrology

## 2017-09-21 ENCOUNTER — Encounter (HOSPITAL_COMMUNITY): Payer: Self-pay | Admitting: Interventional Radiology

## 2017-09-21 DIAGNOSIS — Z992 Dependence on renal dialysis: Secondary | ICD-10-CM

## 2017-09-21 DIAGNOSIS — Z452 Encounter for adjustment and management of vascular access device: Secondary | ICD-10-CM | POA: Insufficient documentation

## 2017-09-21 DIAGNOSIS — N186 End stage renal disease: Secondary | ICD-10-CM | POA: Insufficient documentation

## 2017-09-21 HISTORY — PX: IR REMOVAL TUN CV CATH W/O FL: IMG2289

## 2017-09-21 MED ORDER — LIDOCAINE HCL 1 % IJ SOLN
INTRAMUSCULAR | Status: AC
  Filled 2017-09-21: qty 20

## 2017-09-21 MED ORDER — LIDOCAINE HCL (PF) 1 % IJ SOLN
INTRAMUSCULAR | Status: DC | PRN
  Administered 2017-09-21: 5 mL

## 2017-09-21 MED ORDER — CHLORHEXIDINE GLUCONATE 4 % EX LIQD
CUTANEOUS | Status: AC
  Filled 2017-09-21: qty 15

## 2017-09-21 NOTE — Procedures (Signed)
Pre-procedure Diagnosis: ESRD Post-procedure Diagnosis: Same  Successful bedside removal of tunneled HD catheter.  Complications: None Immediate  EBL: Minimal   The catheter is ready for immediate use.   Ronny Bacon, MD Pager #: [phone]

## 2017-09-30 ENCOUNTER — Ambulatory Visit: Payer: Self-pay | Admitting: *Deleted

## 2017-10-22 ENCOUNTER — Encounter (HOSPITAL_COMMUNITY): Payer: Self-pay | Admitting: *Deleted

## 2017-10-22 ENCOUNTER — Other Ambulatory Visit: Payer: Self-pay

## 2017-10-22 ENCOUNTER — Emergency Department (HOSPITAL_COMMUNITY): Payer: Self-pay

## 2017-10-22 ENCOUNTER — Emergency Department (HOSPITAL_COMMUNITY)
Admission: EM | Admit: 2017-10-22 | Discharge: 2017-10-22 | Disposition: A | Discharge status: Home / Self Care | Payer: Self-pay | Attending: Emergency Medicine | Admitting: Emergency Medicine

## 2017-10-22 DIAGNOSIS — R079 Chest pain, unspecified: Secondary | ICD-10-CM | POA: Insufficient documentation

## 2017-10-22 DIAGNOSIS — M7918 Myalgia, other site: Secondary | ICD-10-CM

## 2017-10-22 DIAGNOSIS — S161XXA Strain of muscle, fascia and tendon at neck level, initial encounter: Secondary | ICD-10-CM | POA: Insufficient documentation

## 2017-10-22 DIAGNOSIS — Y939 Activity, unspecified: Secondary | ICD-10-CM | POA: Insufficient documentation

## 2017-10-22 DIAGNOSIS — I509 Heart failure, unspecified: Secondary | ICD-10-CM | POA: Insufficient documentation

## 2017-10-22 DIAGNOSIS — R51 Headache: Secondary | ICD-10-CM | POA: Insufficient documentation

## 2017-10-22 DIAGNOSIS — I132 Hypertensive heart and chronic kidney disease with heart failure and with stage 5 chronic kidney disease, or end stage renal disease: Secondary | ICD-10-CM | POA: Insufficient documentation

## 2017-10-22 DIAGNOSIS — Y999 Unspecified external cause status: Secondary | ICD-10-CM | POA: Insufficient documentation

## 2017-10-22 DIAGNOSIS — Y92410 Unspecified street and highway as the place of occurrence of the external cause: Secondary | ICD-10-CM | POA: Insufficient documentation

## 2017-10-22 DIAGNOSIS — Z992 Dependence on renal dialysis: Secondary | ICD-10-CM | POA: Insufficient documentation

## 2017-10-22 DIAGNOSIS — N186 End stage renal disease: Secondary | ICD-10-CM | POA: Insufficient documentation

## 2017-10-22 DIAGNOSIS — M791 Myalgia, unspecified site: Secondary | ICD-10-CM | POA: Insufficient documentation

## 2017-10-22 DIAGNOSIS — Z8673 Personal history of transient ischemic attack (TIA), and cerebral infarction without residual deficits: Secondary | ICD-10-CM | POA: Insufficient documentation

## 2017-10-22 DIAGNOSIS — R1030 Lower abdominal pain, unspecified: Secondary | ICD-10-CM | POA: Insufficient documentation

## 2017-10-22 DIAGNOSIS — R110 Nausea: Secondary | ICD-10-CM | POA: Insufficient documentation

## 2017-10-22 LAB — CBC WITH DIFFERENTIAL/PLATELET
Basophils Absolute: 0 10*3/uL (ref 0.0–0.1)
Basophils Relative: 0 %
Eosinophils Absolute: 0.1 10*3/uL (ref 0.0–0.7)
Eosinophils Relative: 2 %
HEMATOCRIT: 38.2 % (ref 36.0–46.0)
Hemoglobin: 11.7 g/dL — ABNORMAL LOW (ref 12.0–15.0)
LYMPHS ABS: 1.3 10*3/uL (ref 0.7–4.0)
LYMPHS PCT: 20 %
MCH: 29.2 pg (ref 26.0–34.0)
MCHC: 30.6 g/dL (ref 30.0–36.0)
MCV: 95.3 fL (ref 78.0–100.0)
MONO ABS: 0.5 10*3/uL (ref 0.1–1.0)
MONOS PCT: 8 %
NEUTROS ABS: 4.4 10*3/uL (ref 1.7–7.7)
Neutrophils Relative %: 70 %
Platelets: 187 10*3/uL (ref 150–400)
RBC: 4.01 MIL/uL (ref 3.87–5.11)
RDW: 15.8 % — AB (ref 11.5–15.5)
WBC: 6.3 10*3/uL (ref 4.0–10.5)

## 2017-10-22 LAB — I-STAT TROPONIN, ED: TROPONIN I, POC: 0 ng/mL (ref 0.00–0.08)

## 2017-10-22 LAB — COMPREHENSIVE METABOLIC PANEL
ALT: 13 U/L — ABNORMAL LOW (ref 14–54)
ANION GAP: 11 (ref 5–15)
AST: 21 U/L (ref 15–41)
Albumin: 4.1 g/dL (ref 3.5–5.0)
Alkaline Phosphatase: 89 U/L (ref 38–126)
BILIRUBIN TOTAL: 0.7 mg/dL (ref 0.3–1.2)
BUN: 26 mg/dL — AB (ref 6–20)
CO2: 31 mmol/L (ref 22–32)
Calcium: 9.9 mg/dL (ref 8.9–10.3)
Chloride: 97 mmol/L — ABNORMAL LOW (ref 101–111)
Creatinine, Ser: 5.48 mg/dL — ABNORMAL HIGH (ref 0.44–1.00)
GFR, EST AFRICAN AMERICAN: 10 mL/min — AB (ref >60)
GFR, EST NON AFRICAN AMERICAN: 9 mL/min — AB (ref >60)
Glucose, Bld: 106 mg/dL — ABNORMAL HIGH (ref 65–99)
POTASSIUM: 4.2 mmol/L (ref 3.5–5.1)
Sodium: 139 mmol/L (ref 135–145)
TOTAL PROTEIN: 8 g/dL (ref 6.5–8.1)

## 2017-10-22 LAB — I-STAT BETA HCG BLOOD, ED (MC, WL, AP ONLY): I-stat hCG, quantitative: <5 m[IU]/mL (ref <5)

## 2017-10-22 MED ORDER — IOPAMIDOL (ISOVUE-300) INJECTION 61%
100.0000 mL | Freq: Once | INTRAVENOUS | Status: AC | PRN
  Administered 2017-10-22: 100 mL via INTRAVENOUS
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
## 2017-10-22 NOTE — ED Notes (Signed)
Patient transported to CT 

## 2017-10-22 NOTE — Discharge Instructions (Signed)
You may take over-the-counter pain medications to help with your symptoms.  Please follow-up with your primary care doctor in 1 week for reevaluation return to the ER if you have any new or worsening symptoms.

## 2017-12-05 DIAGNOSIS — Z992 Dependence on renal dialysis: Secondary | ICD-10-CM | POA: Insufficient documentation

## 2017-12-05 DIAGNOSIS — Z3A08 8 weeks gestation of pregnancy: Secondary | ICD-10-CM | POA: Insufficient documentation

## 2017-12-05 DIAGNOSIS — I509 Heart failure, unspecified: Secondary | ICD-10-CM | POA: Insufficient documentation

## 2017-12-05 DIAGNOSIS — I132 Hypertensive heart and chronic kidney disease with heart failure and with stage 5 chronic kidney disease, or end stage renal disease: Secondary | ICD-10-CM | POA: Insufficient documentation

## 2017-12-05 DIAGNOSIS — O10311 Pre-existing hypertensive heart and chronic kidney disease complicating pregnancy, first trimester: Secondary | ICD-10-CM | POA: Insufficient documentation

## 2017-12-05 DIAGNOSIS — O021 Missed abortion: Secondary | ICD-10-CM | POA: Insufficient documentation

## 2017-12-05 DIAGNOSIS — N186 End stage renal disease: Secondary | ICD-10-CM | POA: Insufficient documentation

## 2017-12-06 ENCOUNTER — Emergency Department (HOSPITAL_COMMUNITY)
Admission: EM | Admit: 2017-12-06 | Discharge: 2017-12-06 | Disposition: A | Discharge status: Home / Self Care | Payer: Self-pay | Attending: Emergency Medicine | Admitting: Emergency Medicine

## 2017-12-06 ENCOUNTER — Emergency Department (HOSPITAL_COMMUNITY): Payer: Self-pay

## 2017-12-06 ENCOUNTER — Encounter (HOSPITAL_COMMUNITY): Payer: Self-pay | Admitting: *Deleted

## 2017-12-06 ENCOUNTER — Other Ambulatory Visit: Payer: Self-pay

## 2017-12-06 DIAGNOSIS — N939 Abnormal uterine and vaginal bleeding, unspecified: Secondary | ICD-10-CM

## 2017-12-06 DIAGNOSIS — O021 Missed abortion: Secondary | ICD-10-CM

## 2017-12-06 DIAGNOSIS — O4690 Antepartum hemorrhage, unspecified, unspecified trimester: Secondary | ICD-10-CM

## 2017-12-06 DIAGNOSIS — Z3490 Encounter for supervision of normal pregnancy, unspecified, unspecified trimester: Secondary | ICD-10-CM

## 2017-12-06 LAB — BASIC METABOLIC PANEL
Anion gap: 16 — ABNORMAL HIGH (ref 5–15)
BUN: 28 mg/dL — ABNORMAL HIGH (ref 6–20)
CALCIUM: 10.1 mg/dL (ref 8.9–10.3)
CHLORIDE: 91 mmol/L — AB (ref 98–111)
CO2: 28 mmol/L (ref 22–32)
CREATININE: 5.81 mg/dL — AB (ref 0.44–1.00)
GFR calc Af Amer: 10 mL/min — ABNORMAL LOW (ref >60)
GFR calc non Af Amer: 8 mL/min — ABNORMAL LOW (ref >60)
GLUCOSE: 105 mg/dL — AB (ref 70–99)
Potassium: 3.8 mmol/L (ref 3.5–5.1)
Sodium: 135 mmol/L (ref 135–145)

## 2017-12-06 LAB — CBC WITH DIFFERENTIAL/PLATELET
Basophils Absolute: 0 10*3/uL (ref 0.0–0.1)
Basophils Relative: 0 %
Eosinophils Absolute: 0.2 10*3/uL (ref 0.0–0.7)
Eosinophils Relative: 1 %
HEMATOCRIT: 38.7 % (ref 36.0–46.0)
HEMOGLOBIN: 12.3 g/dL (ref 12.0–15.0)
LYMPHS ABS: 2 10*3/uL (ref 0.7–4.0)
LYMPHS PCT: 17 %
MCH: 30.3 pg (ref 26.0–34.0)
MCHC: 31.8 g/dL (ref 30.0–36.0)
MCV: 95.3 fL (ref 78.0–100.0)
MONO ABS: 1.1 10*3/uL — AB (ref 0.1–1.0)
MONOS PCT: 9 %
NEUTROS ABS: 8.5 10*3/uL — AB (ref 1.7–7.7)
NEUTROS PCT: 73 %
Platelets: 209 10*3/uL (ref 150–400)
RBC: 4.06 MIL/uL (ref 3.87–5.11)
RDW: 15.5 % (ref 11.5–15.5)
WBC: 11.8 10*3/uL — ABNORMAL HIGH (ref 4.0–10.5)

## 2017-12-06 LAB — WET PREP, GENITAL
CLUE CELLS WET PREP: NONE SEEN
Sperm: NONE SEEN
TRICH WET PREP: NONE SEEN
Yeast Wet Prep HPF POC: NONE SEEN

## 2017-12-06 LAB — HCG, QUANTITATIVE, PREGNANCY: hCG, Beta Chain, Quant, S: >25000 m[IU]/mL — ABNORMAL HIGH (ref <5)

## 2017-12-06 LAB — SAMPLE TO BLOOD BANK

## 2017-12-06 NOTE — ED Triage Notes (Signed)
Pt c/o vaginal bleeding that started a hour ago with nausea, denies any pain,

## 2017-12-06 NOTE — ED Notes (Signed)
Patient transported to Ultrasound 

## 2017-12-06 NOTE — Discharge Instructions (Addendum)
Your pregnancy was 8 weeks, but the baby doesn't have a heart beat.   Su embarazo fue de 8 semanas, pero el beb no tiene latidos cardacos.  You can take acetaminophen for pain if needed. You will continue to have bleeding and pass some clots until you pass the pregnancy. The baby tissue will be tan in color and round. Call [facility] tomorrow morning, July 22 to get a follow up appointment. Return to the ED if you get severe heavy bleeding, severe pain, get light headed or dizzy or feel worse.   Puede tomar acetaminofeno para el dolor si es necesario. Continuar sangrando y pasar algunos cogulos hasta que pase el [place]. El tejido del beb ser de color canela y redondo. Llame al rbol Familiar maana por la [place], 22 de julio para obtener una cita de seguimiento. Regrese a la sala de emergencias si tiene sangrado intenso, dolor intenso, mareo o mareo o si se [employer].

## 2017-12-06 NOTE — ED Provider Notes (Signed)
[facility] EMERGENCY DEPARTMENT Provider Note   CSN: [id number] Arrival date & time: 12/05/17  2328  Time seen 12:30 AM   History   Chief Complaint Chief Complaint  Patient presents with  . Vaginal Bleeding    HPI Maria Vazquez is a 42 y.o. female.  HPI patient states her last normal period was June 28.  About an hour prior to arrival she started having vaginal bleeding that she states is heavier than her usual.  And she is passing large clots.  She has some lower abdominal pain and points to her suprapubic area.  When asked how to describe it she states it feels hot.  She complains of feeling dizzy but not lightheaded.  She has some nausea and states when she swallows she feels like there is blood in her throat.  Patient is noted to have a lot of bruising around her fistula in her right upper arm which she states is typical.  Patient has a history of cervical cancer and states the last time she was seen for it was by Dr. Glo Herring but that has been a while.  She denies any change in her medications.  Patient has end-stage renal disease and gets dialysis on Tuesday, Thursday, and Saturday (today).  She had her dialysis today about 7 AM which was complicated by itching otherwise everything was normal.  PCP Estanislado Emms, MD   Past Medical History:  Diagnosis Date  . Anemia   . Blood transfusion   . Cervix carcinoma in situ Mar 2011  . CHF (congestive heart failure) ([place])   . Chronic kidney disease    T, Th, Sat  . Dialysis patient [facility])    tues, thursday, saturday.  . Gallstone pancreatitis Feb 2011  . Hypertension   . Lupus ([place])   . Lupus nephritis ([place])   . Pneumonia due to organism 03/02/2011  . Proteinuria - cause not known   . Renal failure, acute on chronic (HCC)   . Renal insufficiency   . Seizure disorder ([place]) 03/19/2013  . Stroke [facility])    no residual   . Thrombocytopenia [facility])     Patient Active Problem List   Diagnosis Date Noted  . Clotted  dialysis access ([employer]) 03/13/2017  . Elevated troponin   . Vertigo 01/10/2015  . HTN (hypertension) 01/10/2015  . Hyperkalemia 01/10/2015  . Dizziness 01/10/2015  . Pancreatitis, acute 01/06/2014  . Nonspecific chest pain 01/05/2014  . Left sided chest pain 01/05/2014  . Nausea & vomiting 01/05/2014  . Abdominal pain, lower 01/05/2014  . Yeast vaginitis 01/05/2014  . Seizure disorder ([place]) 03/19/2013  . Meningitis 03/19/2013  . ESRD (end stage renal disease) on dialysis ([place]) 03/19/2013  . Pericardial effusion 03/19/2013  . End stage renal disease ([place]) 10/08/2011  . Lupus (systemic lupus erythematosus) ([place]) 03/02/2011  . ARF (acute renal failure) ([place]) 03/02/2011  . Chest pain at rest 03/02/2011  . Hypertensive urgency 03/02/2011  . Pneumonia due to organism 03/02/2011  . Proteinuria - cause not known   . SOB (shortness of breath) 01/09/2011  . Bilateral swelling of feet 01/09/2011  . Facial swelling 01/09/2011  . Proteinuria 01/09/2011  . Anemia in ESRD (end-stage renal disease) ([place]) 01/09/2011    Past Surgical History:  Procedure Laterality Date  . APPENDECTOMY    . AV FISTULA PLACEMENT    . AV FISTULA PLACEMENT  07/18/2011   Procedure: ARTERIOVENOUS (AV) FISTULA CREATION;  Surgeon: Angelia Mould, MD;  Location: [place];  Service:  Vascular;  Laterality: Right;  . AV FISTULA PLACEMENT  01/23/12   Revision of Right AVF  . AV FISTULA PLACEMENT Left 04/15/2017   Procedure: left brachiocephailc ARTERIOVENOUS (AV) FISTULA CREATION;  Surgeon: Conrad Los Olivos, MD;  Location: [place];  Service: Vascular;  Laterality: Left;  . CERVICAL CONE BIOPSY    . CESAREAN SECTION    . CHOLECYSTECTOMY    . EMBOLECTOMY  10/10/2011   Procedure: EMBOLECTOMY;  Surgeon: Angelia Mould, MD;  Location: [place];  Service: Vascular;  Laterality: Right;  . FISTULOGRAM Right 04/12/2012   Procedure: FISTULOGRAM;  Surgeon: Angelia Mould, MD;  Location: [facility] CATH LAB;  Service: Cardiovascular;   Laterality: Right;  . IR FLUORO GUIDE CV LINE LEFT  03/13/2017  . IR REMOVAL TUN CV CATH W/O FL  09/21/2017  . IR THROMBECTOMY AV FISTULA W/THROMBOLYSIS/PTA INC/SHUNT/IMG RIGHT Right 03/13/2017  . IR US GUIDE VASC ACCESS LEFT  03/13/2017  . IR US GUIDE VASC ACCESS RIGHT  03/13/2017  . SHUNTOGRAM Right 07/07/2011   Procedure: SHUNTOGRAM;  Surgeon: Angelia Mould, MD;  Location: [facility] CATH LAB;  Service: Cardiovascular;  Laterality: Right;  . SHUNTOGRAM N/A 12/08/2011   Procedure: Earney Mallet;  Surgeon: Angelia Mould, MD;  Location: [facility] CATH LAB;  Service: Cardiovascular;  Laterality: N/A;     OB History    Gravida  4   Para  3   Term      Preterm      AB  1   Living  3     SAB  1   TAB      Ectopic      Multiple      Live Births               Home Medications    Prior to Admission medications   Medication Sig Start Date End Date Taking? Authorizing Provider  acetaminophen (TYLENOL) 500 MG tablet Take 500 mg by mouth every 6 (six) hours as needed for mild pain or moderate pain.    [provider]  ferric citrate (AURYXIA) 1 GM 210 MG(Fe) tablet Take 420-630 mg See admin instructions by mouth. 420 mg on Sun/Mon/Wed/Fri and 630 mg on Tues/Thurs/Sat    [provider]  hydroxychloroquine (PLAQUENIL) 200 MG tablet Take 1 tablet (200 mg total) by mouth daily. 01/13/15   Kathie Dike, MD  levETIRAcetam (KEPPRA) 500 MG tablet Take 500 mg See admin instructions by mouth. 500 mg two times a day and an additional 500 mg on Tues/Thurs/Sat after dialysis    [provider]  UNKNOWN TO PATIENT Take 2-3 capsules by mouth 3 (three) times daily with meals.    [provider]    Family History Family History  Problem Relation Age of Onset  . Diabetes Mother   . Diabetes Brother   . Diabetes Brother   . Diabetes Maternal Grandmother   . Anesthesia problems Neg Hx     Social History Social History   Tobacco Use  . Smoking  status: Never Smoker  . Smokeless tobacco: Never Used  Substance Use Topics  . Alcohol use: No  . Drug use: No  lives at home   Allergies   Patient has no known allergies.   Review of Systems Review of Systems  All other systems reviewed and are negative.    Physical Exam Updated Vital Signs BP 116/71 (BP Location: Left Leg)   Pulse 99   Temp 98.1 F (36.7 C) (Oral)  Resp 17   Ht 6' (1.829 m)   Wt 73.5 kg (162 lb)   LMP 11/13/2017   SpO2 99%   BMI 21.97 kg/m   Vital signs normal    Physical Exam  Constitutional: She is oriented to person, place, and time. She appears well-developed and well-nourished.  Non-toxic appearance. She does not appear ill. No distress.  HENT:  Head: Normocephalic and atraumatic.  Right Ear: External ear normal.  Left Ear: External ear normal.  Nose: Nose normal. No mucosal edema or rhinorrhea.  Mouth/Throat: Oropharynx is clear and moist and mucous membranes are normal. No dental abscesses or uvula swelling.  Eyes: Pupils are equal, round, and reactive to light. Conjunctivae and EOM are normal.  Neck: Normal range of motion and full passive range of motion without pain. Neck supple.  Cardiovascular: Normal rate, regular rhythm and normal heart sounds. Exam reveals no gallop and no friction rub.  No murmur heard. Pulmonary/Chest: Effort normal and breath sounds normal. No respiratory distress. She has no wheezes. She has no rhonchi. She has no rales. She exhibits no tenderness and no crepitus.  Abdominal: Soft. Normal appearance and bowel sounds are normal. She exhibits no distension. There is no tenderness. There is no rebound and no guarding.    Patient has some mild tenderness on her right abdomen.  Genitourinary:  Genitourinary Comments: Patient has normal external genitalia, there is some blood in the vault but she is not actively bleeding.  Her cervix is very blue in color without lesions.  Her uterus feels slightly enlarged and is  tender, she is tender over the bilateral adnexa. Cx is closed.  Musculoskeletal: Normal range of motion. She exhibits no edema or tenderness.  Moves all extremities well.  Patient is noted to have a lot of bruising over her right upper arm over her fistula.  Neurological: She is alert and oriented to person, place, and time. She has normal strength. No cranial nerve deficit.  Skin: Skin is warm, dry and intact. No rash noted. No erythema. No pallor.  Psychiatric: She has a normal mood and affect. Her speech is normal and behavior is normal. Her mood appears not anxious.  Nursing note and vitals reviewed.    ED Treatments / Results  Labs (all labs ordered are listed, but only abnormal results are displayed) Results for orders placed or performed during the hospital encounter of 12/06/17  Wet prep, genital  Result Value Ref Range   Yeast Wet Prep HPF POC NONE SEEN NONE SEEN   Trich, Wet Prep NONE SEEN NONE SEEN   Clue Cells Wet Prep HPF POC NONE SEEN NONE SEEN   WBC, Wet Prep HPF POC FEW (A) NONE SEEN   Sperm NONE SEEN   CBC with Differential  Result Value Ref Range   WBC 11.8 (H) 4.0 - 10.5 K/uL   RBC 4.06 3.87 - 5.11 MIL/uL   Hemoglobin 12.3 12.0 - 15.0 g/dL   HCT 38.7 36.0 - 46.0 %   MCV 95.3 78.0 - 100.0 fL   MCH 30.3 26.0 - 34.0 pg   MCHC 31.8 30.0 - 36.0 g/dL   RDW 15.5 11.5 - 15.5 %   Platelets 209 150 - 400 K/uL   Neutrophils Relative % 73 %   Neutro Abs 8.5 (H) 1.7 - 7.7 K/uL   Lymphocytes Relative 17 %   Lymphs Abs 2.0 0.7 - 4.0 K/uL   Monocytes Relative 9 %   Monocytes Absolute 1.1 (H) 0.1 - 1.0 K/uL  Eosinophils Relative 1 %   Eosinophils Absolute 0.2 0.0 - 0.7 K/uL   Basophils Relative 0 %   Basophils Absolute 0.0 0.0 - 0.1 K/uL  Basic metabolic panel  Result Value Ref Range   Sodium 135 135 - 145 mmol/L   Potassium 3.8 3.5 - 5.1 mmol/L   Chloride 91 (L) 98 - 111 mmol/L   CO2 28 22 - 32 mmol/L   Glucose, Bld 105 (H) 70 - 99 mg/dL   BUN 28 (H) 6 - 20 mg/dL    Creatinine, Ser 5.81 (H) 0.44 - 1.00 mg/dL   Calcium 10.1 8.9 - 10.3 mg/dL   GFR calc non Af Amer 8 (L) >60 mL/min   GFR calc Af Amer 10 (L) >60 mL/min   Anion gap 16 (H) 5 - 15  hCG, quantitative, pregnancy  Result Value Ref Range   hCG, Beta Chain, Quant, S >25,000 (H) <5 mIU/mL  Sample to Blood Bank  Result Value Ref Range   Blood Bank Specimen SAMPLE AVAILABLE FOR TESTING    Sample Expiration      12/07/2017 Performed at [facility], [address] [place], [place] [address]    Laboratory interpretation all normal except chronic renal failure without hyperkalemia, leukocytosis, positive pregnancy test.    EKG None  Radiology US Ob Comp Less 14 Wks  US Ob Transvaginal  Result Date: 12/06/2017 CLINICAL DATA:  42 year old pregnant female with vaginal bleeding. LMP: 11/13/2017 corresponding to an estimated gestational age of [redacted] weeks, 2 days. EXAM: OBSTETRIC <14 WK [place] AND TRANSVAGINAL OB US TECHNIQUE: Both transabdominal and transvaginal ultrasound examinations were performed for complete evaluation of the gestation as well as the maternal uterus, adnexal regions, and pelvic cul-de-sac. Transvaginal technique was performed to assess early pregnancy. COMPARISON:  None. FINDINGS: Intrauterine gestational sac: Single intrauterine gestational sac. Yolk sac:  Seen Embryo:  Present Cardiac Activity: Not detected Heart Rate: N/A  bpm CRL:  18 mm   8 w   2 d                  [place] EDC: 07/16/2018 Subchorionic hemorrhage: Small hypoechoic foci in the placenta may represent placental [place] or small subchorionic hemorrhages. Maternal uterus/adnexae: The maternal ovaries appear unremarkable. There is trace free fluid within the pelvis. IMPRESSION: Single intrauterine pregnancy with an estimated gestational age of [redacted] weeks, 2 days based on today's crown-rump length. No embryonic cardiac activity identified consistent with a failed early pregnancy. Clinical correlation and obstetrical consult is  advised. These results were called by telephone at the time of interpretation on 12/06/2017 at 3:14 am to Dr. Rolland Porter , who verbally acknowledged these results. Electronically Signed   By: Anner Crete M.D.   On: 12/06/2017 03:20    Procedures Procedures (including critical care time)  Medications Ordered in ED Medications - No data to display   Initial Impression / Assessment and Plan / ED Course  I have reviewed the triage vital signs and the nursing notes.  Pertinent labs & imaging results that were available during my care of the patient were reviewed by me and considered in my medical decision making (see chart for details).    1:45 AM I did patient's pelvic exam.  There was a small amount of blood in the vault.  Her cervix was very blue in color.  Her uterus did feel slightly enlarged.  When we discussed her positive pregnancy test patient is shocked, she states she had her tubes tied about 6 years  ago.  Pelvic ultrasound was ordered as a emergency procedure to look for ectopic pregnancy.  3:15 a.m. radiologist called the ultrasound report.  He states that the fetus is intrauterine and appears to be 8 weeks 2 days.  He states there is no cardiac activity so she is having a fetal demise.  Review of patient's chart shows she had a type and Rh done in 2012 and she is O positive  4 AM I talked to the patient and her family about her test results.  She has seen Dr. Glo Herring in the past and she should call their office Monday, July 22 for follow-up.  We discussed that if she passes tissue will be a light tan color it could be sort around in shape.  However she will continue to have bleeding and pass red blood clots until she loses the pregnancy.  Final Clinical Impressions(s) / ED Diagnoses   Final diagnoses:  Pregnancy  Vaginal bleeding  Vaginal bleeding in pregnancy  Fetal demise before 20 weeks with retention of dead fetus    ED Discharge Orders    None    OTC  acetaminophen  Plan discharge  Rolland Porter, MD, Barbette Or, MD 12/06/17 [phone]

## 2017-12-07 LAB — GC/CHLAMYDIA PROBE AMP (~~LOC~~) NOT AT ARMC
Chlamydia: NEGATIVE
Neisseria Gonorrhea: NEGATIVE

## 2017-12-09 ENCOUNTER — Encounter: Payer: Self-pay | Admitting: Obstetrics and Gynecology

## 2017-12-09 ENCOUNTER — Ambulatory Visit (INDEPENDENT_AMBULATORY_CARE_PROVIDER_SITE_OTHER): Payer: Self-pay | Admitting: Obstetrics and Gynecology

## 2017-12-09 VITALS — BP 169/107 | HR 82 | Wt 161.2 lb

## 2017-12-09 DIAGNOSIS — O021 Missed abortion: Secondary | ICD-10-CM

## 2017-12-09 MED ORDER — MISOPROSTOL 200 MCG PO TABS: 800.0000 ug | ORAL_TABLET | Freq: Once | ORAL | 1 refills | Status: DC

## 2017-12-09 NOTE — Progress Notes (Signed)
Patient ID: Maria Vazquez, female   DOB: November 24, 1975, 42 y.o.   MRN: [id number]    [facility] Visit  @DATE @            Patient name: Maria Vazquez MRN [id number]  Date of birth: Apr 11, 1976  CC & HPI:  Maria Vazquez is a 42 y.o. female presenting today for miscarriage. She had light bleeding since her visit to the ER on 12/06/2017 from miscarriage.  She.  She is not passing tissue  ROS:  ROS +miscarriage +vaginal bleeding -fever -chills All systems are negative except as noted in the HPI and PMH.  Pertinent History Reviewed:   Reviewed: Significant for  Medical         Past Medical History:  Diagnosis Date  . Anemia   . Blood transfusion   . Cervix carcinoma in situ Mar 2011  . CHF (congestive heart failure) ([place])   . Chronic kidney disease    T, Th, Sat  . Dialysis patient [facility])    tues, thursday, saturday.  . Gallstone pancreatitis Feb 2011  . Hypertension   . Lupus ([place])   . Lupus nephritis ([place])   . Pneumonia due to organism 03/02/2011  . Proteinuria - cause not known   . Renal failure, acute on chronic (HCC)   . Renal insufficiency   . Seizure disorder ([place]) 03/19/2013  . Stroke [facility])    no residual   . Thrombocytopenia ([place])                               Surgical Hx:    Past Surgical History:  Procedure Laterality Date  . APPENDECTOMY    . AV FISTULA PLACEMENT    . AV FISTULA PLACEMENT  07/18/2011   Procedure: ARTERIOVENOUS (AV) FISTULA CREATION;  Surgeon: Angelia Mould, MD;  Location: [place];  Service: Vascular;  Laterality: Right;  . AV FISTULA PLACEMENT  01/23/12   Revision of Right AVF  . AV FISTULA PLACEMENT Left 04/15/2017   Procedure: left brachiocephailc ARTERIOVENOUS (AV) FISTULA CREATION;  Surgeon: Conrad Anderson, MD;  Location: [place];  Service: Vascular;  Laterality: Left;  . CERVICAL CONE BIOPSY    . CESAREAN SECTION    . CHOLECYSTECTOMY    . EMBOLECTOMY  10/10/2011   Procedure: EMBOLECTOMY;  Surgeon:  Angelia Mould, MD;  Location: [place];  Service: Vascular;  Laterality: Right;  . FISTULOGRAM Right 04/12/2012   Procedure: FISTULOGRAM;  Surgeon: Angelia Mould, MD;  Location: [facility] CATH LAB;  Service: Cardiovascular;  Laterality: Right;  . IR FLUORO GUIDE CV LINE LEFT  03/13/2017  . IR REMOVAL TUN CV CATH W/O FL  09/21/2017  . IR THROMBECTOMY AV FISTULA W/THROMBOLYSIS/PTA INC/SHUNT/IMG RIGHT Right 03/13/2017  . IR US GUIDE VASC ACCESS LEFT  03/13/2017  . IR US GUIDE VASC ACCESS RIGHT  03/13/2017  . SHUNTOGRAM Right 07/07/2011   Procedure: SHUNTOGRAM;  Surgeon: Angelia Mould, MD;  Location[facility] CATH LAB;  Service: Cardiovascular;  Laterality: Right;  . SHUNTOGRAM N/A 12/08/2011   Procedure: Earney Mallet;  Surgeon: Angelia Mould, MD;  Location: [facility] CATH LAB;  Service: Cardiovascular;  Laterality: N/A;   Medications: Reviewed & Updated - see associated section                       Current Outpatient Medications:  .  acetaminophen (TYLENOL) 500 MG tablet, Take 500 mg by mouth every  6 (six) hours as needed for mild pain or moderate pain., Disp: , Rfl:  .  ferric citrate (AURYXIA) 1 GM 210 MG(Fe) tablet, Take 420-630 mg See admin instructions by mouth. 420 mg on Sun/Mon/Wed/Fri and 630 mg on Tues/Thurs/Sat, Disp: , Rfl:  .  hydroxychloroquine (PLAQUENIL) 200 MG tablet, Take 1 tablet (200 mg total) by mouth daily., Disp: 30 tablet, Rfl: 0 .  levETIRAcetam (KEPPRA) 500 MG tablet, Take 500 mg See admin instructions by mouth. 500 mg two times a day and an additional 500 mg on Tues/Thurs/Sat after dialysis, Disp: , Rfl:  .  UNKNOWN TO PATIENT, Take 2-3 capsules by mouth 3 (three) times daily with meals., Disp: , Rfl:  No current facility-administered medications for this visit.   Facility-Administered Medications Ordered in Other Visits:  .  0.9 %  sodium chloride infusion, , Intravenous, Continuous, Monia Sabal, PA-C   Social History: Reviewed -  reports that she has never  smoked. She has never used smokeless tobacco.  Objective Findings:  Vitals: Last menstrual period 11/13/2017.  PHYSICAL EXAMINATION General appearance - alert, well appearing, and in no distress and oriented to person, place, and time Mental status - alert, oriented to person, place, and time, normal mood, behavior, speech, dress, motor activity, and thought processes, affect appropriate to mood  PELVIC DEFERRED  Assessment & Plan:   A:  1. Missed AB  P:  1. Rx Cytotec 800 mcg per vagina this evening may repeat in 48 hours if incomplete expulsion tissue 2. F/u 1 week for contraceptive options, considering IUD  Given information on IUD  By signing my name below, I, Maria Vazquez, attest that this documentation has been prepared under the direction and in the presence of Jonnie Kind, MD. Electronically Signed: [place]. 12/09/17. 1:52 PM.  I personally performed the services described in this documentation, which was SCRIBED in my presence. The recorded information has been reviewed and considered accurate. It has been edited as necessary during review. Jonnie Kind, MD

## 2017-12-09 NOTE — Patient Instructions (Signed)

## 2017-12-16 ENCOUNTER — Encounter: Payer: Self-pay | Admitting: Obstetrics and Gynecology

## 2017-12-16 ENCOUNTER — Ambulatory Visit (INDEPENDENT_AMBULATORY_CARE_PROVIDER_SITE_OTHER): Payer: Self-pay | Admitting: Obstetrics and Gynecology

## 2017-12-16 VITALS — BP 114/79 | HR 76 | Ht 66.0 in | Wt 160.8 lb

## 2017-12-16 DIAGNOSIS — O039 Complete or unspecified spontaneous abortion without complication: Secondary | ICD-10-CM

## 2017-12-16 NOTE — Progress Notes (Signed)
Patient ID: Maria Vazquez, female   DOB: 1975-09-17, 42 y.o.   MRN: [id number]    [facility] Visit  @DATE @            Patient name: Maria Vazquez MRN [id number]  Date of birth: 1976-03-05  CC & HPI:  Maria Vazquez is a 42 y.o. female with end-stage renal disease, presenting today for follow up of IUD information. Her husband is getting a vasectomy instead of her getting IUD. She has been getting her periods regularly and has not had heavy bleeding. The took the pills 2 times and and had passed all of uterine tissue. She has light spotting on her periods pads but nothing of concern.  ROS:  ROS -fever -chills All systems are negative except as noted in the HPI and PMH.   Pertinent History Reviewed:   Reviewed: Significant for Medical         Past Medical History:  Diagnosis Date  . Anemia   . Blood transfusion   . Cervix carcinoma in situ Mar 2011  . CHF (congestive heart failure) ([place])   . Chronic kidney disease    T, Th, Sat  . Dialysis patient [facility])    tues, thursday, saturday.  . Gallstone pancreatitis Feb 2011  . Hypertension   . Lupus ([place])   . Lupus nephritis ([place])   . Pneumonia due to organism 03/02/2011  . Proteinuria - cause not known   . Renal failure, acute on chronic (HCC)   . Renal insufficiency   . Seizure disorder ([place]) 03/19/2013  . Stroke [facility])    no residual   . Thrombocytopenia ([place])                               Surgical Hx:    Past Surgical History:  Procedure Laterality Date  . APPENDECTOMY    . AV FISTULA PLACEMENT    . AV FISTULA PLACEMENT  07/18/2011   Procedure: ARTERIOVENOUS (AV) FISTULA CREATION;  Surgeon: Angelia Mould, MD;  Location: [place];  Service: Vascular;  Laterality: Right;  . AV FISTULA PLACEMENT  01/23/12   Revision of Right AVF  . AV FISTULA PLACEMENT Left 04/15/2017   Procedure: left brachiocephailc ARTERIOVENOUS (AV) FISTULA CREATION;  Surgeon: Conrad [place], MD;  Location: [place];   Service: Vascular;  Laterality: Left;  . CERVICAL CONE BIOPSY    . CESAREAN SECTION    . CHOLECYSTECTOMY    . DILATION AND CURETTAGE OF UTERUS    . EMBOLECTOMY  10/10/2011   Procedure: EMBOLECTOMY;  Surgeon: Angelia Mould, MD;  Location: [place];  Service: Vascular;  Laterality: Right;  . FISTULOGRAM Right 04/12/2012   Procedure: FISTULOGRAM;  Surgeon: Angelia Mould, MD;  Location: [facility] CATH LAB;  Service: Cardiovascular;  Laterality: Right;  . IR FLUORO GUIDE CV LINE LEFT  03/13/2017  . IR REMOVAL TUN CV CATH W/O FL  09/21/2017  . IR THROMBECTOMY AV FISTULA W/THROMBOLYSIS/PTA INC/SHUNT/IMG RIGHT Right 03/13/2017  . IR US GUIDE VASC ACCESS LEFT  03/13/2017  . IR US GUIDE VASC ACCESS RIGHT  03/13/2017  . SHUNTOGRAM Right 07/07/2011   Procedure: SHUNTOGRAM;  Surgeon: Angelia Mould, MD;  Location: [facility] CATH LAB;  Service: Cardiovascular;  Laterality: Right;  . SHUNTOGRAM N/A 12/08/2011   Procedure: Earney Mallet;  Surgeon: Angelia Mould, MD;  Location: [facility] CATH LAB;  Service: Cardiovascular;  Laterality: N/A;  . TUBAL LIGATION  Medications: Reviewed & Updated - see associated section                       Current Outpatient Medications:  .  ferric citrate (AURYXIA) 1 GM 210 MG(Fe) tablet, Take 420-630 mg See admin instructions by mouth. 420 mg on Sun/Mon/Wed/Fri and 630 mg on Tues/Thurs/Sat, Disp: , Rfl:  .  hydroxychloroquine (PLAQUENIL) 200 MG tablet, Take 1 tablet (200 mg total) by mouth daily., Disp: 30 tablet, Rfl: 0 .  levETIRAcetam (KEPPRA) 500 MG tablet, Take 500 mg See admin instructions by mouth. 500 mg two times a day and an additional 500 mg on Tues/Thurs/Sat after dialysis, Disp: , Rfl:  .  UNKNOWN TO PATIENT, Take 2-3 capsules by mouth 3 (three) times daily with meals., Disp: , Rfl:  .  acetaminophen (TYLENOL) 500 MG tablet, Take 500 mg by mouth every 6 (six) hours as needed for mild pain or moderate pain., Disp: , Rfl:  .  misoprostol (CYTOTEC) 200 MCG  tablet, Place 4 tablets (800 mcg total) vaginally once for 1 dose. May repeat in 2 days if needed., Disp: 4 tablet, Rfl: 1 No current facility-administered medications for this visit.   Facility-Administered Medications Ordered in Other Visits:  .  0.9 %  sodium chloride infusion, , Intravenous, Continuous, Monia Sabal, PA-C   Social History: Reviewed -  reports that she has never smoked. She has never used smokeless tobacco.  Objective Findings:  Vitals: Blood pressure 114/79, pulse 76, height 5\' 6"  (1.676 m), weight 160 lb 12.8 oz (72.9 kg).  PHYSICAL EXAMINATION General appearance - alert, well appearing, and in no distress and oriented to person, place, and time Mental status - alert, oriented to person, place, and time, normal mood, behavior, speech, dress, motor activity, and thought processes, affect appropriate to mood  PELVIC DEFERRED   Assessment & Plan:   A:  1.  Completed spontaneous AB offered to order serum hCG, as urine pregnancy test unreliable due to anuria, patient declines this test  2. husband contraception vasectomy  P:  1.  F/u PRN @  [facility]    By signing my name below, I, [name], attest that this documentation has been prepared under the direction and in the presence of Jonnie Kind, MD. Electronically Signed: [place]. 12/16/17. 3:24 PM.  I personally performed the services described in this documentation, which was SCRIBED in my presence. The recorded information has been reviewed and considered accurate. It has been edited as necessary during review. Jonnie Kind, MD

## 2018-01-22 ENCOUNTER — Other Ambulatory Visit (HOSPITAL_COMMUNITY): Payer: Self-pay | Admitting: *Deleted

## 2018-01-22 DIAGNOSIS — Z1231 Encounter for screening mammogram for malignant neoplasm of breast: Secondary | ICD-10-CM

## 2018-01-27 ENCOUNTER — Ambulatory Visit (HOSPITAL_COMMUNITY)
Admission: RE | Admit: 2018-01-27 | Discharge: 2018-01-27 | Disposition: A | Discharge status: Home / Self Care | Payer: PRIVATE HEALTH INSURANCE | Source: Ambulatory Visit | Attending: *Deleted | Admitting: *Deleted

## 2018-01-27 ENCOUNTER — Encounter (HOSPITAL_COMMUNITY): Payer: Self-pay

## 2018-01-27 DIAGNOSIS — Z1231 Encounter for screening mammogram for malignant neoplasm of breast: Secondary | ICD-10-CM | POA: Diagnosis present

## 2019-01-18 ENCOUNTER — Other Ambulatory Visit (HOSPITAL_COMMUNITY): Payer: Self-pay | Admitting: Nephrology

## 2019-01-18 DIAGNOSIS — N186 End stage renal disease: Secondary | ICD-10-CM

## 2019-01-20 ENCOUNTER — Other Ambulatory Visit: Payer: Self-pay | Admitting: Physician Assistant

## 2019-01-21 ENCOUNTER — Ambulatory Visit (HOSPITAL_COMMUNITY)
Admission: RE | Admit: 2019-01-21 | Discharge: 2019-01-21 | Disposition: A | Discharge status: Home / Self Care | Payer: Self-pay | Source: Ambulatory Visit | Attending: Nephrology | Admitting: Nephrology

## 2019-01-21 ENCOUNTER — Encounter (HOSPITAL_COMMUNITY): Payer: Self-pay | Admitting: Interventional Radiology

## 2019-01-21 ENCOUNTER — Other Ambulatory Visit: Payer: Self-pay

## 2019-01-21 DIAGNOSIS — T82510A Breakdown (mechanical) of surgically created arteriovenous fistula, initial encounter: Secondary | ICD-10-CM | POA: Insufficient documentation

## 2019-01-21 DIAGNOSIS — I509 Heart failure, unspecified: Secondary | ICD-10-CM | POA: Diagnosis not present

## 2019-01-21 DIAGNOSIS — M3214 Glomerular disease in systemic lupus erythematosus: Secondary | ICD-10-CM | POA: Insufficient documentation

## 2019-01-21 DIAGNOSIS — Y841 Kidney dialysis as the cause of abnormal reaction of the patient, or of later complication, without mention of misadventure at the time of the procedure: Secondary | ICD-10-CM | POA: Diagnosis not present

## 2019-01-21 DIAGNOSIS — Z992 Dependence on renal dialysis: Secondary | ICD-10-CM | POA: Insufficient documentation

## 2019-01-21 DIAGNOSIS — Z8673 Personal history of transient ischemic attack (TIA), and cerebral infarction without residual deficits: Secondary | ICD-10-CM | POA: Insufficient documentation

## 2019-01-21 DIAGNOSIS — Z79899 Other long term (current) drug therapy: Secondary | ICD-10-CM | POA: Diagnosis not present

## 2019-01-21 DIAGNOSIS — N186 End stage renal disease: Secondary | ICD-10-CM | POA: Insufficient documentation

## 2019-01-21 DIAGNOSIS — I132 Hypertensive heart and chronic kidney disease with heart failure and with stage 5 chronic kidney disease, or end stage renal disease: Secondary | ICD-10-CM | POA: Insufficient documentation

## 2019-01-21 DIAGNOSIS — G40909 Epilepsy, unspecified, not intractable, without status epilepticus: Secondary | ICD-10-CM | POA: Diagnosis not present

## 2019-01-21 HISTORY — PX: IR DIALY SHUNT INTRO NEEDLE/INTRACATH INITIAL W/IMG LEFT: IMG6102

## 2019-01-21 LAB — PROTIME-INR
INR: 1 (ref 0.8–1.2)
Prothrombin Time: 12.6 seconds (ref 11.4–15.2)

## 2019-01-21 LAB — CBC
HCT: 37.4 % (ref 36.0–46.0)
Hemoglobin: 12 g/dL (ref 12.0–15.0)
MCH: 31.6 pg (ref 26.0–34.0)
MCHC: 32.1 g/dL (ref 30.0–36.0)
MCV: 98.4 fL (ref 80.0–100.0)
Platelets: 206 10*3/uL (ref 150–400)
RBC: 3.8 MIL/uL — ABNORMAL LOW (ref 3.87–5.11)
RDW: 12.9 % (ref 11.5–15.5)
WBC: 8.2 10*3/uL (ref 4.0–10.5)
nRBC: 0 % (ref 0.0–0.2)

## 2019-01-21 LAB — BASIC METABOLIC PANEL
Anion gap: 15 (ref 5–15)
BUN: 33 mg/dL — ABNORMAL HIGH (ref 6–20)
CO2: 27 mmol/L (ref 22–32)
Calcium: 9.6 mg/dL (ref 8.9–10.3)
Chloride: 94 mmol/L — ABNORMAL LOW (ref 98–111)
Creatinine, Ser: 7.58 mg/dL — ABNORMAL HIGH (ref 0.44–1.00)
GFR calc Af Amer: 7 mL/min — ABNORMAL LOW (ref >60)
GFR calc non Af Amer: 6 mL/min — ABNORMAL LOW (ref >60)
Glucose, Bld: 86 mg/dL (ref 70–99)
Potassium: 4.5 mmol/L (ref 3.5–5.1)
Sodium: 136 mmol/L (ref 135–145)

## 2019-01-21 MED ORDER — SODIUM CHLORIDE 0.9 % IV SOLN: INTRAVENOUS | Status: DC

## 2019-01-21 MED ORDER — IOHEXOL 300 MG/ML  SOLN
100.0000 mL | Freq: Once | INTRAMUSCULAR | Status: AC | PRN
  Administered 2019-01-21: 45 mL via INTRAVENOUS

## 2019-01-21 NOTE — Discharge Instructions (Addendum)
Sedacin consciente [employer] adultos, cuidados posteriores (Moderate Conscious Sedation, Adult, Care After) Estas indicaciones le proporcionan informacin acerca de cmo deber cuidarse despus del procedimiento. El mdico tambin podr darle instrucciones ms especficas. El tratamiento ha sido planificado segn las prcticas mdicas actuales, pero en algunos casos pueden ocurrir problemas. Comunquese con el mdico si tiene algn problema o dudas despus del procedimiento. QU ESPERAR DESPUS DEL PROCEDIMIENTO Despus del procedimiento, es comn:  Sentirse somnoliento durante varias horas.  Sentirse torpe y [employer] de equilibrio durante varias horas.  Perder el sentido de la realidad durante varias horas.  Vomitar si come [employer]. INSTRUCCIONES PARA EL CUIDADO EN EL HOGAR  Durante al menos 24horas despus del procedimiento:  No haga lo siguiente: ? Participar en actividades que impliquen posibles cadas o lesiones. ? Conducir vehculos. ? Operar maquinarias pesadas. ? Beber alcohol. ? Tomar somnferos o medicamentos que causen somnolencia. ? Firmar documentos legales ni tomar [employer]. ? Cuidar a nios por su cuenta.  Hacer reposo. Comida y bebida  Siga la dieta recomendada por el mdico.  Si vomita: ? Pruebe agua, jugo o sopa cuando usted pueda beber sin vomitar. ? Asegrese de no tener nuseas antes de ingerir alimentos slidos. Instrucciones generales  Permanezca con un adulto responsable hasta que est completamente despierto y consciente.  Tome los medicamentos de venta libre y los recetados solamente como se lo haya indicado el mdico.  Si fuma, no lo haga sin supervisin.  Concurra a todas las visitas de control como se lo haya indicado el mdico. Esto es importante. SOLICITE ATENCIN MDICA SI:  Sigue teniendo nuseas o vomitando.  Tiene sensacin de desvanecimiento.  Le aparece una erupcin cutnea.  Tiene  fiebre. SOLICITE ATENCIN MDICA DE INMEDIATO SI:  Tiene dificultad para respirar. Esta informacin no tiene Marine scientist el consejo del mdico. Asegrese de hacerle al mdico cualquier pregunta que tenga. Document Released: 05/10/2013 Document Revised: 01/10/2016 Document Reviewed: 08/25/2015 Elsevier Patient Education  [place] del acceso vascular para dilisis Dialysis Vascular Access Malfunction        Un acceso vascular es una entrada a los vasos sanguneos que se utiliza en la dilisis. La dilisis es un tratamiento usado para la insuficiencia renal. El mdico puede hacer un acceso vascular de Education officer, environmental, tales como:  Uniendo una arteria a una vena por debajo de la piel para agrandar un vaso sanguneo que se denomina fstula.  Uniendo una arteria a una vena por debajo de la piel con un tubo blando llamado injerto.  Colocando un tubo delgado y flexible (catter) en una vena grande, generalmente en el cuello, el pecho o la ingle. Un acceso vascular se puede obstruir o puede dejar de funcionar correctamente (disfuncin). Qu puede causar disfuncin del acceso vascular?  Infeccin. Esta es la causa ms frecuente de disfuncin.  Un cogulo sanguneo dentro de una parte de la fstula, injerto o catter. Un cogulo sanguneo puede obstruir completamente o parcialmente el flujo de [place].  Un obstculo en el injerto o el catter.  Una acumulacin de sangre (hematoma o moretn) cerca del injerto o del catter, que presiona contra este y [place] flujo de [place]. Cules son los signos y sntomas de disfuncin del acceso vascular? Entre los signos y sntomas de disfuncin del Paramedic vascular se incluyen:  Un cambio en la vibracin o el pulso (frmito) de la fstula o del injerto.  Desaparece el frmito de la fstula o del injerto.  Hay una inflamacin nueva  o inusual del rea alrededor del acceso.  Una puncin fallida en el acceso por parte del  equipo de dilisis.  El flujo de [place] a travs de la fstula, el injerto o el catter es demasiado lento para una dilisis eficaz.  Sangrado que no se puede controlar fcilmente cuando termina la dilisis de [place] y se retira la aguja.  Signos de infeccin como dolor, hinchazn, enrojecimiento, estras rojas, entumecimiento y secrecin de sangre o pus que emana del acceso o de la zona. Qu ocurre si mi acceso vascular funciona mal? El mdico podr [employer] de [place], cultivos o una radiografa para descubrir cul fue el problema. La radiografa implica la inyeccin de un tinte (contraste) en el acceso vascular. El lquido aparece en la radiografa y permite al mdico observar si hay una obstruccin en el acceso vascular. El tratamiento vara segn la causa de la disfuncin:  Si el acceso vascular se infecta, el mdico podr indicarle antibiticos para controlar la infeccin.  Si se encuentra un cogulo en el acceso vascular, podr ser necesario que se someta a una ciruga para eliminarlo. Tambin se Emergency planning/management officer.  Si la obstruccin del Paramedic vascular se debe a alguna otra causa, como un pinzamiento en el injerto, es probable que necesite de [place] para desbloquear o Child psychotherapist injerto.  Si por cualquier razn hay una disfuncin, el mdico puede retirar el acceso y Environmental manager. Siga estas indicaciones en su casa: Medicamentos  [employer] de venta libre y los recetados solamente como se lo haya indicado el mdico.  Si le recetaron un antibitico, tmelo como se lo haya indicado el mdico. No deje de usar el antibitico aunque comience a Sports administrator. Actividad  No levante objetos pesados con el brazo que tiene el [place].  Trate de no golpear ni lesionar el brazo que tiene el [place]. Estilo de vida  No use ropa apretada ni joyas alrededor del acceso.  No duerma con el brazo del acceso debajo de su cabeza o de su cuerpo. Instrucciones  generales  Lvese las manos con agua y jabn antes de tocar la zona alrededor del acceso vascular. Use desinfectante para manos si no dispone de [place] y [place].  Concurra a todas las visitas de control como se lo haya indicado el mdico. Esto es muy importante. Elmer Bales en los controles puede causar una disfuncin permanente del acceso vascular, lo que puede ser peligroso.  No deje que le tomen la presin arterial en el brazo que tiene el [place].  Mantenga el rea del acceso limpia y no use maquillaje, cremas, lociones ni ungentos.  Controle la zona del [employer] para detectar signos de infeccin. Est atento a los siguientes signos: ? Aumento del enrojecimiento, de la hinchazn o del dolor. ? Mayor presencia de lquido o [place]. ? Calor. ? Pus o mal olor. Comunquese con un mdico si:  La hinchazn alrededor del acceso vascular empeora.  Aparece un dolor nuevo. Solicite ayuda de inmediato si:  Tiene sangrado en el acceso vascular que no puede controlarse fcilmente.  Tiene dolor, entumecimiento, piel inusualmente plida, dedos azulados o llagas en la punta de los dedos de la mano del lado de la fstula.  Tiene escalofros.  Tiene fiebre.  Hay pus u otro lquido (drenaje) en el lugar del acceso vascular.  Hay enrojecimiento o lneas rojas en la piel alrededor, por encima o por debajo del acceso vascular.  El acceso est caliente, hinchado, rojo y es muy doloroso.  Repentinamente puede ver el manguito del catter que se [place] en el acceso. Resumen  Un acceso vascular es una entrada a los vasos sanguneos que se utiliza en la dilisis.  La disfuncin del acceso vascular puede tener varias razones.  El tratamiento vara segn la causa dela disfuncin.  Concurra a todas las visitas de control como se lo haya indicado el mdico. Toda demora en los controles puede causar una disfuncin permanente del acceso vascular, lo que puede ser peligroso. Esta informacin no  tiene Marine scientist el consejo del mdico. Asegrese de hacerle al mdico cualquier pregunta que tenga. Document Released: 08/21/2008 Document Revised: 07/31/2017 Document Reviewed: 11/06/2016 Elsevier Patient Education  2020 [place]. Dialysis Vascular Access Malfunction        A vascular access is an entrance to your blood vessels that can be used for dialysis. Dialysis is a treatment used for kidney failure. A health care provider can make a vascular access in many ways, such as by:  Joining an artery to a vein under your skin to make a bigger blood vessel called a fistula.  Joining an artery to a vein under your skin using a soft tube called a graft.  Placing a thin, flexible tube (catheter) in a large vein, usually in your neck, chest, or groin. A vascular access can become blocked or stop working correctly (malfunction). What can cause my vascular access to malfunction?  Infection. This is the most common cause of malfunction.  A blood clot inside a part of the fistula, graft, or catheter. A blood clot can completely or partially block the flow of blood.  A kink in the graft or catheter.  A collection of blood (hematoma or bruise) next to the graft or catheter that pushes against it, blocking the flow of blood. What are signs and symptoms of vascular access malfunction? Signs and symptoms of vascular access malfunction include:  A change in the vibration or pulse (thrill) of your fistula or graft.  The thrill of your fistula or graft being gone.  New or unusual swelling of the area around the access.  An unsuccessful puncture of your access by the dialysis team.  The flow of blood through the fistula, graft, or catheter being too slow for effective dialysis.  Bleeding that cannot be easily controlled when routine dialysis is completed and the needle is removed.  Signs of infection such as pain, swelling, redness, red streaks, numbness, and blood or pus coming  from or around the access. What happens if my vascular access malfunctions? If your vascular access malfunctions, your health care provider may order blood work, cultures, or an X-ray test to find out what went wrong. The X-ray test involves the injection of a dye (contrast) into the vascular access. The contrast shows up on the X-ray and lets your health care provider see if there is a blockage in the vascular access. Treatment varies depending on the cause of the malfunction:  If the vascular access is infected, your health care provider may prescribe antibiotic medicine to control the infection.  If a clot is found in the vascular access, you may need surgery to remove the clot. Blood thinning medicines may also be used.  If a blockage in the vascular access is due to some other cause, such as a kink in a graft, you will likely need surgery to unblock or replace the graft.  If there is a malfunction for any reason, your health care provider may remove and replace your  access. Follow these instructions at home: Medicines  Take over-the-counter and prescription medicines only as told by your health care provider.  If you were prescribed an antibiotic medicine, use it as told by your health care provider. Do not stop using the antibiotic even if you start to feel better. Activity  Do not lift heavy things with the arm that has the access.  Try not to bump or hurt the arm that has the access. Lifestyle  Do not wear jewelry or tight clothing around the access.  Do not sleep by placing the access arm under your head or body. General instructions  Wash your hands with soap and water before touching the area around the vascular access. If soap and water are not available, use hand sanitizer.  Keep all follow-up visits as told by your health care provider. This is very important. Any delay in follow-up could cause permanent dysfunction of the vascular access, which may be dangerous.  Do  not measure your blood pressure on the arm that has the access.  Keep the access area clean and do not use makeup, creams, lotions, or ointments on it.  Check your access area every day for signs of infection. Check for: ? More redness, swelling, or pain. ? More fluid or blood. ? Warmth. ? Pus or a bad smell. Contact a health care provider if:  Swelling around the vascular access gets worse.  You develop new pain. Get help right away if:  You have bleeding at the vascular access that cannot be easily controlled.  You have pain, numbness, an unusual pale skin color, or blue fingers or sores at the tips of your fingers in the hand on the side of your fistula.  You have chills.  You have a fever.  You have pus or other fluid (drainage) at the vascular access site.  You develop skin redness or red streaking on the skin around, above, or below the vascular access.  Your access is hot, swollen, red, and very painful.  You can suddenly see the cuff of your catheter used in the access. Summary  A vascular access is an entrance to your blood vessels that can be used for dialysis.  Several things can cause your vascular access to malfunction.  Treatment varies depending on the cause of the malfunction.  Keep all follow-up visits as told by your health care provider. Any delay in follow-up could cause permanent dysfunction of the vascular access, which may be dangerous. This information is not intended to replace advice given to you by your health care provider. Make sure you discuss any questions you have with your health care provider. Document Released: 04/07/2006 Document Revised: 07/31/2017 Document Reviewed: 05/30/2016 Elsevier Patient Education  [place]. Moderate Conscious Sedation, Adult, Care After These instructions provide you with information about caring for yourself after your procedure. Your health care provider may also give you more specific instructions.  Your treatment has been planned according to current medical practices, but problems sometimes occur. Call your health care provider if you have any problems or questions after your procedure. What can I expect after the procedure? After your procedure, it is common:  To feel sleepy for several hours.  To feel clumsy and have poor balance for several hours.  To have poor judgment for several hours.  To vomit if you eat too soon. Follow these instructions at home: For at least 24 hours after the procedure:   Do not: ? Participate in activities where you  could fall or become injured. ? Drive. ? Use heavy machinery. ? Drink alcohol. ? Take sleeping pills or medicines that cause drowsiness. ? Make important decisions or sign legal documents. ? Take care of children on your own.  Rest. Eating and drinking  Follow the diet recommended by your health care provider.  If you vomit: ? Drink water, juice, or soup when you can drink without vomiting. ? Make sure you have little or no nausea before eating solid foods. General instructions  Have a responsible adult stay with you until you are awake and alert.  Take over-the-counter and prescription medicines only as told by your health care provider.  If you smoke, do not smoke without supervision.  Keep all follow-up visits as told by your health care provider. This is important. Contact a health care provider if:  You keep feeling nauseous or you keep vomiting.  You feel light-headed.  You develop a rash.  You have a fever. Get help right away if:  You have trouble breathing. This information is not intended to replace advice given to you by your health care provider. Make sure you discuss any questions you have with your health care provider. Document Released: 02/23/2013 Document Revised: 04/17/2017 Document Reviewed: 08/25/2015 Elsevier Patient Education  2020 [employer].

## 2019-01-21 NOTE — H&P (Signed)
Chief Complaint: Patient was seen in consultation today for Av fistolgram at the request of Lexa  Referring Physician(s): Coladonato,Joseph  Supervising Physician: Aletta Edouard  Patient Status: [facility] - Out-pt  History of Present Illness: Maria Vazquez is a 43 y.o. female with ESRD. She has HD via (L)UE AVF, but has recently had some access flow issues. Last HD was yesterday. She is here for fistulogram PMHx, meds, labs, imaging, allergies reviewed. Feels well, no recent fevers, chills, illness. Has been NPO today as directed.    Past Medical History:  Diagnosis Date  . Anemia   . Blood transfusion   . Cervix carcinoma in situ Mar 2011  . CHF (congestive heart failure) ([place])   . Chronic kidney disease    T, Th, Sat  . Dialysis patient [facility])    tues, thursday, saturday.  . Gallstone pancreatitis Feb 2011  . Hypertension   . Lupus ([place])   . Lupus nephritis ([place])   . Pneumonia due to organism 03/02/2011  . Proteinuria - cause not known   . Renal failure, acute on chronic (HCC)   . Renal insufficiency   . Seizure disorder ([place]) 03/19/2013  . Stroke [facility])    no residual   . Thrombocytopenia ([place])     Past Surgical History:  Procedure Laterality Date  . APPENDECTOMY    . AV FISTULA PLACEMENT    . AV FISTULA PLACEMENT  07/18/2011   Procedure: ARTERIOVENOUS (AV) FISTULA CREATION;  Surgeon: Angelia Mould, MD;  Location: [place];  Service: Vascular;  Laterality: Right;  . AV FISTULA PLACEMENT  01/23/12   Revision of Right AVF  . AV FISTULA PLACEMENT Left 04/15/2017   Procedure: left brachiocephailc ARTERIOVENOUS (AV) FISTULA CREATION;  Surgeon: Conrad , MD;  Location: [place];  Service: Vascular;  Laterality: Left;  . CERVICAL CONE BIOPSY    . CESAREAN SECTION    . CHOLECYSTECTOMY    . DILATION AND CURETTAGE OF UTERUS    . EMBOLECTOMY  10/10/2011   Procedure: EMBOLECTOMY;  Surgeon: Angelia Mould, MD;  Location: [place];   Service: Vascular;  Laterality: Right;  . FISTULOGRAM Right 04/12/2012   Procedure: FISTULOGRAM;  Surgeon: Angelia Mould, MD;  Location: [facility] CATH LAB;  Service: Cardiovascular;  Laterality: Right;  . IR FLUORO GUIDE CV LINE LEFT  03/13/2017  . IR REMOVAL TUN CV CATH W/O FL  09/21/2017  . IR THROMBECTOMY AV FISTULA W/THROMBOLYSIS/PTA INC/SHUNT/IMG RIGHT Right 03/13/2017  . IR US GUIDE VASC ACCESS LEFT  03/13/2017  . IR US GUIDE VASC ACCESS RIGHT  03/13/2017  . SHUNTOGRAM Right 07/07/2011   Procedure: SHUNTOGRAM;  Surgeon: Angelia Mould, MD;  Location: [facility] CATH LAB;  Service: Cardiovascular;  Laterality: Right;  . SHUNTOGRAM N/A 12/08/2011   Procedure: Earney Mallet;  Surgeon: Angelia Mould, MD;  Location: [facility] CATH LAB;  Service: Cardiovascular;  Laterality: N/A;  . TUBAL LIGATION      Allergies: Patient has no known allergies.  Medications: Prior to Admission medications   Medication Sig Start Date End Date Taking? Authorizing Provider  ferric citrate (AURYXIA) 1 GM 210 MG(Fe) tablet Take 420-630 mg See admin instructions by mouth. 420 mg on Sun/Mon/Wed/Fri and 630 mg on Tues/Thurs/Sat   Yes [provider]  hydroxychloroquine (PLAQUENIL) 200 MG tablet Take 1 tablet (200 mg total) by mouth daily. 01/13/15  Yes Kathie Dike, MD  levETIRAcetam (KEPPRA) 500 MG tablet Take 500 mg See admin instructions by mouth. 500 mg two times a day  and an additional 500 mg on Tues/Thurs/Sat after dialysis   Yes [provider]  UNKNOWN TO PATIENT Take 2-3 capsules by mouth 3 (three) times daily with meals.   Yes [provider]  acetaminophen (TYLENOL) 500 MG tablet Take 500 mg by mouth every 6 (six) hours as needed for mild pain or moderate pain.    [provider]  misoprostol (CYTOTEC) 200 MCG tablet Place 4 tablets (800 mcg total) vaginally once for 1 dose. May repeat in 2 days if needed. 12/09/17 12/09/17  Jonnie Kind, MD     Family History   Problem Relation Age of Onset  . Diabetes Mother   . Diabetes Brother   . Diabetes Brother   . Diabetes Maternal Grandmother   . Anesthesia problems Neg Hx     Social History   Socioeconomic History  . Marital status: Married    Spouse name: Not on file  . Number of children: Not on file  . Years of education: Not on file  . Highest education level: Not on file  Occupational History  . Not on file  Social Needs  . Financial resource strain: Not on file  . Food insecurity    Worry: Not on file    Inability: Not on file  . Transportation needs    Medical: Not on file    Non-medical: Not on file  Tobacco Use  . Smoking status: Never Smoker  . Smokeless tobacco: Never Used  Substance and Sexual Activity  . Alcohol use: No  . Drug use: No  . Sexual activity: Yes    Birth control/protection: Condom, Surgical  Lifestyle  . Physical activity    Days per week: Not on file    Minutes per session: Not on file  . Stress: Not on file  Relationships  . Social Herbalist on phone: Not on file    Gets together: Not on file    Attends religious service: Not on file    Active member of club or organization: Not on file    Attends meetings of clubs or organizations: Not on file    Relationship status: Not on file  Other Topics Concern  . Not on file  Social History Narrative  . Not on file    Review of Systems: A 12 point ROS discussed and pertinent positives are indicated in the HPI above.  All other systems are negative.  Review of Systems  Vital Signs: BP 135/89   Pulse 75   Temp (!) 97.3 F (36.3 C)   Resp 16   Ht 5\' 6"  (1.676 m)   Wt 79.4 kg   SpO2 99%   BMI 28.25 kg/m   Physical Exam Constitutional:      Appearance: Normal appearance.  HENT:     Head: Normocephalic.     Mouth/Throat:     Mouth: Mucous membranes are moist.     Pharynx: Oropharynx is clear.  Cardiovascular:     Rate and Rhythm: Normal rate and regular rhythm.     Heart  sounds: Normal heart sounds.  Pulmonary:     Effort: Pulmonary effort is normal. No respiratory distress.     Breath sounds: Normal breath sounds.  Skin:    Comments: (L)UE AVF palpable, good pulse thrill  Neurological:     General: No focal deficit present.     Mental Status: She is alert and oriented to person, place, and time.  Psychiatric:  Mood and Affect: Mood normal.        Judgment: Judgment normal.     Imaging: No results found.  Labs:  CBC: Recent Labs    01/21/19 0651  WBC 8.2  HGB 12.0  HCT 37.4  PLT 206    COAGS: Recent Labs    01/21/19 0651  INR 1.0    BMP: Recent Labs    01/21/19 0651  NA 136  K 4.5  CL 94*  CO2 27  GLUCOSE 86  BUN 33*  CALCIUM 9.6  CREATININE 7.58*  GFRNONAA 6*  GFRAA 7*    Assessment and Plan: ESRD (L)UE AVF dysfunction For Fistulogram Labs reviewed. Risks and benefits discussed with the patient including, but not limited to bleeding, infection, vascular injury, pulmonary embolism, need for tunneled HD catheter placement or even death.  All of the patient's questions were answered, patient is agreeable to proceed. Consent signed and in chart.  Encounter via in person interpreter   Thank you for this interesting consult.  I greatly enjoyed meeting Emerald Sanpedro and look forward to participating in their care.  A copy of this report was sent to the requesting provider on this date.  Electronically Signed: Ascencion Dike, PA-C 01/21/2019, 8:37 AM   I spent a total of 20 minutes in face to face in clinical consultation, greater than 50% of which was counseling/coordinating care for Fistulogram

## 2019-04-29 ENCOUNTER — Other Ambulatory Visit (HOSPITAL_COMMUNITY): Payer: Self-pay | Admitting: *Deleted

## 2019-04-29 DIAGNOSIS — Z1231 Encounter for screening mammogram for malignant neoplasm of breast: Secondary | ICD-10-CM

## 2019-05-02 ENCOUNTER — Ambulatory Visit (HOSPITAL_COMMUNITY)
Admission: RE | Admit: 2019-05-02 | Discharge: 2019-05-02 | Disposition: A | Discharge status: Home / Self Care | Payer: PRIVATE HEALTH INSURANCE | Source: Ambulatory Visit | Attending: *Deleted | Admitting: *Deleted

## 2019-05-02 ENCOUNTER — Other Ambulatory Visit: Payer: Self-pay

## 2019-05-02 DIAGNOSIS — Z1231 Encounter for screening mammogram for malignant neoplasm of breast: Secondary | ICD-10-CM

## 2019-08-12 ENCOUNTER — Emergency Department (HOSPITAL_COMMUNITY)
Admission: EM | Admit: 2019-08-12 | Discharge: 2019-08-13 | Disposition: A | Discharge status: Home / Self Care | Payer: Self-pay | Attending: Emergency Medicine | Admitting: Emergency Medicine

## 2019-08-12 ENCOUNTER — Other Ambulatory Visit: Payer: Self-pay

## 2019-08-12 ENCOUNTER — Encounter (HOSPITAL_COMMUNITY): Payer: Self-pay

## 2019-08-12 DIAGNOSIS — N186 End stage renal disease: Secondary | ICD-10-CM | POA: Insufficient documentation

## 2019-08-12 DIAGNOSIS — Z8673 Personal history of transient ischemic attack (TIA), and cerebral infarction without residual deficits: Secondary | ICD-10-CM | POA: Insufficient documentation

## 2019-08-12 DIAGNOSIS — Z79899 Other long term (current) drug therapy: Secondary | ICD-10-CM | POA: Insufficient documentation

## 2019-08-12 DIAGNOSIS — Z992 Dependence on renal dialysis: Secondary | ICD-10-CM | POA: Insufficient documentation

## 2019-08-12 DIAGNOSIS — I132 Hypertensive heart and chronic kidney disease with heart failure and with stage 5 chronic kidney disease, or end stage renal disease: Secondary | ICD-10-CM | POA: Insufficient documentation

## 2019-08-12 DIAGNOSIS — Z9049 Acquired absence of other specified parts of digestive tract: Secondary | ICD-10-CM | POA: Insufficient documentation

## 2019-08-12 DIAGNOSIS — R1084 Generalized abdominal pain: Secondary | ICD-10-CM | POA: Insufficient documentation

## 2019-08-12 DIAGNOSIS — I509 Heart failure, unspecified: Secondary | ICD-10-CM | POA: Insufficient documentation

## 2019-08-12 LAB — COMPREHENSIVE METABOLIC PANEL
ALT: 24 U/L (ref 0–44)
AST: 19 U/L (ref 15–41)
Albumin: 3.7 g/dL (ref 3.5–5.0)
Alkaline Phosphatase: 72 U/L (ref 38–126)
Anion gap: 16 — ABNORMAL HIGH (ref 5–15)
BUN: 48 mg/dL — ABNORMAL HIGH (ref 6–20)
CO2: 25 mmol/L (ref 22–32)
Calcium: 9.3 mg/dL (ref 8.9–10.3)
Chloride: 96 mmol/L — ABNORMAL LOW (ref 98–111)
Creatinine, Ser: 8.83 mg/dL — ABNORMAL HIGH (ref 0.44–1.00)
GFR calc Af Amer: 6 mL/min — ABNORMAL LOW (ref >60)
GFR calc non Af Amer: 5 mL/min — ABNORMAL LOW (ref >60)
Glucose, Bld: 152 mg/dL — ABNORMAL HIGH (ref 70–99)
Potassium: 3.7 mmol/L (ref 3.5–5.1)
Sodium: 137 mmol/L (ref 135–145)
Total Bilirubin: 0.9 mg/dL (ref 0.3–1.2)
Total Protein: 7.3 g/dL (ref 6.5–8.1)

## 2019-08-12 LAB — CBC
HCT: 33.7 % — ABNORMAL LOW (ref 36.0–46.0)
Hemoglobin: 11 g/dL — ABNORMAL LOW (ref 12.0–15.0)
MCH: 31.3 pg (ref 26.0–34.0)
MCHC: 32.6 g/dL (ref 30.0–36.0)
MCV: 96 fL (ref 80.0–100.0)
Platelets: 205 10*3/uL (ref 150–400)
RBC: 3.51 MIL/uL — ABNORMAL LOW (ref 3.87–5.11)
RDW: 13.3 % (ref 11.5–15.5)
WBC: 8.3 10*3/uL (ref 4.0–10.5)
nRBC: 0 % (ref 0.0–0.2)

## 2019-08-12 LAB — LIPASE, BLOOD: Lipase: 71 U/L — ABNORMAL HIGH (ref 11–51)

## 2019-08-12 NOTE — ED Triage Notes (Signed)
Pt reports abd pain for the past 3 days, denies n/v. Pain is worse after a meal. Dialysis pt t/th/sat, last treatment was yesterday.

## 2019-08-13 ENCOUNTER — Emergency Department (HOSPITAL_COMMUNITY): Payer: Self-pay

## 2019-08-13 ENCOUNTER — Encounter (HOSPITAL_COMMUNITY): Payer: Self-pay | Admitting: Radiology

## 2019-08-13 LAB — I-STAT BETA HCG BLOOD, ED (MC, WL, AP ONLY): I-stat hCG, quantitative: <5 m[IU]/mL (ref <5)

## 2019-08-13 MED ORDER — IOHEXOL 300 MG/ML  SOLN
100.0000 mL | Freq: Once | INTRAMUSCULAR | Status: AC | PRN
  Administered 2019-08-13: 01:00:00 100 mL via INTRAVENOUS

## 2019-08-13 MED ORDER — ONDANSETRON HCL 4 MG/2ML IJ SOLN
4.0000 mg | Freq: Once | INTRAMUSCULAR | Status: AC
  Administered 2019-08-13: 4 mg via INTRAVENOUS
  Filled 2019-08-13: qty 2

## 2019-08-13 MED ORDER — MORPHINE SULFATE (PF) 4 MG/ML IV SOLN
4.0000 mg | Freq: Once | INTRAVENOUS | Status: AC
  Administered 2019-08-13: 4 mg via INTRAVENOUS
  Filled 2019-08-13: qty 1

## 2019-08-13 MED ORDER — TRAMADOL HCL 50 MG PO TABS: 50.0000 mg | ORAL_TABLET | Freq: Four times a day (QID) | ORAL | 0 refills | Status: DC | PRN

## 2019-08-13 NOTE — Discharge Instructions (Addendum)
Begin taking tramadol as prescribed as needed for pain.  Follow-up with gastroenterology if your symptoms or not improving in the next few days.  The contact information for [facility] has been provided in this discharge summary for you to call and make these arrangements.  Return to the emergency department in the meantime if you develop worsening pain, high fever, bloody stool, or other new and concerning symptoms.

## 2019-08-13 NOTE — ED Provider Notes (Signed)
[place] EMERGENCY DEPARTMENT Provider Note   CSN: [id number] Arrival date & time: 08/12/19  1605     History Chief Complaint  Patient presents with  . Abdominal Pain    Maria Vazquez is a 44 y.o. female.  Patient is a 44 year old female with history of lupus with renal failure.  Patient is on dialysis Tuesday, Thursday, and Saturday.  She presents today for evaluation of abdominal pain.  She describes generalized abdominal discomfort that has been worsening over the past week.  She states her pain is worse when she eats.  She denies any diarrhea, but does report some constipation.  She denies any bloody stools or melena.  Patient does not make urine.  Last menstrual period was 3 weeks ago and normal.  The history is provided by the patient.  Abdominal Pain Pain location:  Generalized Pain quality: cramping   Pain radiates to:  Does not radiate Pain severity:  Moderate Duration:  1 week Timing:  Constant Progression:  Worsening Chronicity:  New Relieved by:  Nothing Worsened by:  Eating and palpation Ineffective treatments:  None tried Associated symptoms: no fever        Past Medical History:  Diagnosis Date  . Anemia   . Blood transfusion   . Cervix carcinoma in situ Mar 2011  . CHF (congestive heart failure) ([place])   . Chronic kidney disease    T, Th, Sat  . Dialysis patient [facility])    tues, thursday, saturday.  . Gallstone pancreatitis Feb 2011  . Hypertension   . Lupus ([place])   . Lupus nephritis ([place])   . Pneumonia due to organism 03/02/2011  . Proteinuria - cause not known   . Renal failure, acute on chronic (HCC)   . Renal insufficiency   . Seizure disorder ([place]) 03/19/2013  . Stroke [facility])    no residual   . Thrombocytopenia [facility])     Patient Active Problem List   Diagnosis Date Noted  . Clotted dialysis access ([place]) 03/13/2017  . Elevated troponin   . Vertigo 01/10/2015  . HTN (hypertension) 01/10/2015  .  Hyperkalemia 01/10/2015  . Dizziness 01/10/2015  . Pancreatitis, acute 01/06/2014  . Nonspecific chest pain 01/05/2014  . Left sided chest pain 01/05/2014  . Nausea & vomiting 01/05/2014  . Abdominal pain, lower 01/05/2014  . Yeast vaginitis 01/05/2014  . Seizure disorder ([place]) 03/19/2013  . Meningitis 03/19/2013  . ESRD (end stage renal disease) on dialysis ([place]) 03/19/2013  . Pericardial effusion 03/19/2013  . End stage renal disease ([place]) 10/08/2011  . Lupus (systemic lupus erythematosus) ([place]) 03/02/2011  . ARF (acute renal failure) ([place]) 03/02/2011  . Chest pain at rest 03/02/2011  . Hypertensive urgency 03/02/2011  . Pneumonia due to organism 03/02/2011  . Proteinuria - cause not known   . SOB (shortness of breath) 01/09/2011  . Bilateral swelling of feet 01/09/2011  . Facial swelling 01/09/2011  . Proteinuria 01/09/2011  . Anemia in ESRD (end-stage renal disease) ([place]) 01/09/2011    Past Surgical History:  Procedure Laterality Date  . APPENDECTOMY    . AV FISTULA PLACEMENT    . AV FISTULA PLACEMENT  07/18/2011   Procedure: ARTERIOVENOUS (AV) FISTULA CREATION;  Surgeon: Angelia Mould, MD;  Location: [place];  Service: Vascular;  Laterality: Right;  . AV FISTULA PLACEMENT  01/23/12   Revision of Right AVF  . AV FISTULA PLACEMENT Left 04/15/2017   Procedure: left brachiocephailc ARTERIOVENOUS (AV) FISTULA CREATION;  Surgeon: Conrad [place],  MD;  Location: [facility] OR;  Service: Vascular;  Laterality: Left;  . CERVICAL CONE BIOPSY    . CESAREAN SECTION    . CHOLECYSTECTOMY    . DILATION AND CURETTAGE OF UTERUS    . EMBOLECTOMY  10/10/2011   Procedure: EMBOLECTOMY;  Surgeon: Angelia Mould, MD;  Location: [place];  Service: Vascular;  Laterality: Right;  . FISTULOGRAM Right 04/12/2012   Procedure: FISTULOGRAM;  Surgeon: Angelia Mould, MD;  Location: [facility] CATH LAB;  Service: Cardiovascular;  Laterality: Right;  . IR DIALY SHUNT INTRO NEEDLE/INTRACATH INITIAL W/IMG  LEFT Left 01/21/2019  . IR FLUORO GUIDE CV LINE LEFT  03/13/2017  . IR REMOVAL TUN CV CATH W/O FL  09/21/2017  . IR THROMBECTOMY AV FISTULA W/THROMBOLYSIS/PTA INC/SHUNT/IMG RIGHT Right 03/13/2017  . IR US GUIDE VASC ACCESS LEFT  03/13/2017  . IR US GUIDE VASC ACCESS RIGHT  03/13/2017  . SHUNTOGRAM Right 07/07/2011   Procedure: SHUNTOGRAM;  Surgeon: Angelia Mould, MD;  Location: [facility] CATH LAB;  Service: Cardiovascular;  Laterality: Right;  . SHUNTOGRAM N/A 12/08/2011   Procedure: Earney Mallet;  Surgeon: Angelia Mould, MD;  Location: [facility] CATH LAB;  Service: Cardiovascular;  Laterality: N/A;  . TUBAL LIGATION       OB History    Gravida  4   Para  3   Term      Preterm      AB  1   Living  3     SAB  1   TAB      Ectopic      Multiple      Live Births              Family History  Problem Relation Age of Onset  . Diabetes Mother   . Diabetes Brother   . Diabetes Brother   . Diabetes Maternal Grandmother   . Anesthesia problems Neg Hx     Social History   Tobacco Use  . Smoking status: Never Smoker  . Smokeless tobacco: Never Used  Substance Use Topics  . Alcohol use: No  . Drug use: No    Home Medications Prior to Admission medications   Medication Sig Start Date End Date Taking? Authorizing Provider  acetaminophen (TYLENOL) 500 MG tablet Take 500 mg by mouth every 6 (six) hours as needed for mild pain or moderate pain.    [provider]  ferric citrate (AURYXIA) 1 GM 210 MG(Fe) tablet Take 420-630 mg See admin instructions by mouth. 420 mg on Sun/Mon/Wed/Fri and 630 mg on Tues/Thurs/Sat    [provider]  hydroxychloroquine (PLAQUENIL) 200 MG tablet Take 1 tablet (200 mg total) by mouth daily. 01/13/15   Kathie Dike, MD  levETIRAcetam (KEPPRA) 500 MG tablet Take 500 mg See admin instructions by mouth. 500 mg two times a day and an additional 500 mg on Tues/Thurs/Sat after dialysis    [provider]  misoprostol  (CYTOTEC) 200 MCG tablet Place 4 tablets (800 mcg total) vaginally once for 1 dose. May repeat in 2 days if needed. 12/09/17 12/09/17  Jonnie Kind, MD  UNKNOWN TO PATIENT Take 2-3 capsules by mouth 3 (three) times daily with meals.    [provider]    Allergies    Patient has no known allergies.  Review of Systems   Review of Systems  Constitutional: Negative for fever.  Gastrointestinal: Positive for abdominal pain.  All other systems reviewed and are negative.   Physical Exam Updated Vital  Signs BP (!) 166/96 (BP Location: Left Leg)   Pulse 76   Temp 98.2 F (36.8 C) (Oral)   Resp 20   Ht 5\' 6"  (1.676 m)   Wt 79 kg   SpO2 100%   BMI 28.11 kg/m   Physical Exam Vitals and nursing note reviewed.  Constitutional:      General: She is not in acute distress.    Appearance: She is well-developed. She is not diaphoretic.  HENT:     Head: Normocephalic and atraumatic.  Cardiovascular:     Rate and Rhythm: Normal rate and regular rhythm.     Heart sounds: No murmur. No friction rub. No gallop.   Pulmonary:     Effort: Pulmonary effort is normal. No respiratory distress.     Breath sounds: Normal breath sounds. No wheezing.  Abdominal:     General: Bowel sounds are normal. There is no distension.     Palpations: Abdomen is soft.     Tenderness: There is generalized abdominal tenderness. There is no right CVA tenderness, left CVA tenderness, guarding or rebound.  Musculoskeletal:        General: Normal range of motion.     Cervical back: Normal range of motion and neck supple.  Skin:    General: Skin is warm and dry.  Neurological:     Mental Status: She is alert and oriented to person, place, and time.     ED Results / Procedures / Treatments   Labs (all labs ordered are listed, but only abnormal results are displayed) Labs Reviewed  LIPASE, BLOOD - Abnormal; Notable for the following components:      Result Value   Lipase 71 (*)    All other  components within normal limits  COMPREHENSIVE METABOLIC PANEL - Abnormal; Notable for the following components:   Chloride 96 (*)    Glucose, Bld 152 (*)    BUN 48 (*)    Creatinine, Ser 8.83 (*)    GFR calc non Af Amer 5 (*)    GFR calc Af Amer 6 (*)    Anion gap 16 (*)    All other components within normal limits  CBC - Abnormal; Notable for the following components:   RBC 3.51 (*)    Hemoglobin 11.0 (*)    HCT 33.7 (*)    All other components within normal limits  I-STAT BETA HCG BLOOD, ED ([facility], [facility], [facility] ONLY)    EKG None  Radiology No results found.  Procedures Procedures (including critical care time)  Medications Ordered in ED Medications  ondansetron (ZOFRAN) injection 4 mg (has no administration in time range)  morphine 4 MG/ML injection 4 mg (has no administration in time range)    ED Course  I have reviewed the triage vital signs and the nursing notes.  Pertinent labs & imaging results that were available during my care of the patient were reviewed by me and considered in my medical decision making (see chart for details).    MDM Rules/Calculators/A&P  Patient with history of prior cholecystectomy presenting with complaints of generalized abdominal pain.  This is worsened over the past several days.  Her laboratory studies are reassuring and CT scan is negative for acute process.  At this point, I feel the patient appropriate for discharge.  She will be given medication for pain and is to follow-up with gastroenterology if her symptoms are not improving in the next few days.  Final Clinical Impression(s) / ED Diagnoses Final diagnoses:  None    Rx / DC Orders ED Discharge Orders    None       Veryl Speak, MD 08/13/19 [phone]

## 2019-08-13 NOTE — ED Notes (Signed)
Patient verbalizes understanding of discharge instructions, prescription medications, and follow up care. Opportunity for questioning and answers were provided. All questions answered completely. PIV removed, catheter intact. site dressed with gauze and tape. Armband removed by staff, pt discharged from ED. Ambulatory with strong, steady gait.

## 2019-08-16 LAB — I-STAT BETA HCG BLOOD, ED (MC, WL, AP ONLY): I-stat hCG, quantitative: <5 m[IU]/mL (ref <5)

## 2019-09-02 ENCOUNTER — Telehealth: Payer: Self-pay | Admitting: Cardiovascular Disease

## 2019-09-02 NOTE — Telephone Encounter (Signed)
Make appt with APP. I haven't seen her since December 2018.

## 2019-09-02 NOTE — Telephone Encounter (Signed)
Returned call to [place]. Made pt appointment. (09/05/19 @1 - virtual) with Dr. Bronson Ing to discuss issues. Called pt to inform of appt. And get more information, no answer. Left detailed message asking pt to return call.

## 2019-09-02 NOTE — Telephone Encounter (Signed)
Spoke with nurse Juliann Pulse who states that patient was in clinic on yesterday and c/o of chest pain. Pt was eval. By Dr. Hollie Salk at that time. It was requested that the pt have a follow up visit with cardiology. Please advise

## 2019-09-02 NOTE — Telephone Encounter (Signed)
Pt c/o of Chest Pain: STAT if CP now or developed within 24 hours  1. Are you having CP right now? N/A  2. Are you experiencing any other symptoms (ex. SOB, nausea, vomiting, sweating)? No, per [facility]   3. How long have you been experiencing CP? Not sure, per [facility]  4. Is your CP continuous or coming and going? Not sure, per [facility] 5. Have you taken Nitroglycerin? Not sure, per Pollie Meyer with [facility] states the patient has been experiencing chest pain that radiates down the patient's back. She is requesting to advise with a nurse and schedule an appointment with Dr. Bronson Ing. Please return call to [facility] to discuss at [phone]. ?

## 2019-09-05 ENCOUNTER — Encounter: Payer: Self-pay | Admitting: Cardiovascular Disease

## 2019-09-05 ENCOUNTER — Telehealth (INDEPENDENT_AMBULATORY_CARE_PROVIDER_SITE_OTHER): Payer: Self-pay | Admitting: Cardiovascular Disease

## 2019-09-05 VITALS — Ht 60.0 in | Wt 165.0 lb

## 2019-09-05 DIAGNOSIS — R079 Chest pain, unspecified: Secondary | ICD-10-CM

## 2019-09-05 DIAGNOSIS — M329 Systemic lupus erythematosus, unspecified: Secondary | ICD-10-CM

## 2019-09-05 DIAGNOSIS — Z992 Dependence on renal dialysis: Secondary | ICD-10-CM

## 2019-09-05 DIAGNOSIS — N186 End stage renal disease: Secondary | ICD-10-CM

## 2019-09-05 NOTE — Patient Instructions (Signed)
Medication Instructions:  Your physician recommends that you continue on your current medications as directed. Please refer to the Current Medication list given to you today.  *If you need a refill on your cardiac medications before your next appointment, please call your pharmacy*   Lab Work: None today If you have labs (blood work) drawn today and your tests are completely normal, you will receive your results only by: Marland Kitchen MyChart Message (if you have MyChart) OR . A paper copy in the mail If you have any lab test that is abnormal or we need to change your treatment, we will call you to review the results.   Testing/Procedures: None today   Follow-Up: At [facility], you and your health needs are our priority.  As part of our continuing mission to provide you with exceptional heart care, we have created designated Provider Care Teams.  These Care Teams include your primary Cardiologist (physician) and Advanced Practice Providers (APPs -  Physician Assistants and Nurse Practitioners) who all work together to provide you with the care you need, when you need it.  We recommend signing up for the patient portal called "MyChart".  Sign up information is provided on this After Visit Summary.  MyChart is used to connect with patients for Virtual Visits (Telemedicine).  Patients are able to view lab/test results, encounter notes, upcoming appointments, etc.  Non-urgent messages can be sent to your provider as well.   To learn more about what you can do with MyChart, go to [email or web address].    Your next appointment:  As needed      Thank you for choosing [facility] !

## 2019-09-05 NOTE — Progress Notes (Signed)
Virtual Visit via Telephone Note   This visit type was conducted due to national recommendations for restrictions regarding the COVID-19 Pandemic (e.g. social distancing) in an effort to limit this patient's exposure and mitigate transmission in our community.  Due to her co-morbid illnesses, this patient is at least at moderate risk for complications without adequate follow up.  This format is felt to be most appropriate for this patient at this time.  The patient did not have access to video technology/had technical difficulties with video requiring transitioning to audio format only (telephone).  All issues noted in this document were discussed and addressed.  No physical exam could be performed with this format.  Please refer to the patient's chart for her  consent to telehealth for [facility].   The patient was identified using 2 identifiers.  Date:  09/05/2019   ID:  Maria Vazquez, DOB December 27, 1975, MRN [id number]  Patient Location: Home Provider Location: Office  PCP:  Estanislado Emms, MD  Cardiologist:  No primary care provider on file.  Electrophysiologist:  None   Evaluation Performed:  Follow-Up Visit  Chief Complaint: Chest pain  History of Present Illness:    Maria Vazquez is a 44 y.o. female with chest pain.  I have not personally evaluated her since 2018.  Our office was contacted on 09/02/2019 as the patient was at clinic and evaluated by Dr. Hollie Salk for chest pain.  Echocardiogram on 03/14/2017 demonstrated low normal LV systolic function, EF 50 to 55%, with possible mild hypokinesis of the anteroseptal myocardium.  Past medical history includes end-stage renal disease on hemodialysis, lupus, and chronic anemia.  She also has a history of pulmonary embolism.  She told me she very seldom has chest pains.  She said they occur when it is cold outside.  She currently denies shortness of breath.  She refused a stress test.  Past Medical History:   Diagnosis Date  . Anemia   . Blood transfusion   . Cervix carcinoma in situ Mar 2011  . CHF (congestive heart failure) ([place])   . Chronic kidney disease    T, Th, Sat  . Dialysis patient [facility])    tues, thursday, saturday.  . Gallstone pancreatitis Feb 2011  . Hypertension   . Lupus ([place])   . Lupus nephritis ([place])   . Pneumonia due to organism 03/02/2011  . Proteinuria - cause not known   . Renal failure, acute on chronic (HCC)   . Renal insufficiency   . Seizure disorder ([place]) 03/19/2013  . Stroke [facility])    no residual   . Thrombocytopenia ([place])    Past Surgical History:  Procedure Laterality Date  . APPENDECTOMY    . AV FISTULA PLACEMENT    . AV FISTULA PLACEMENT  07/18/2011   Procedure: ARTERIOVENOUS (AV) FISTULA CREATION;  Surgeon: Angelia Mould, MD;  Location: [place];  Service: Vascular;  Laterality: Right;  . AV FISTULA PLACEMENT  01/23/12   Revision of Right AVF  . AV FISTULA PLACEMENT Left 04/15/2017   Procedure: left brachiocephailc ARTERIOVENOUS (AV) FISTULA CREATION;  Surgeon: Conrad Greenock, MD;  Location: [place];  Service: Vascular;  Laterality: Left;  . CERVICAL CONE BIOPSY    . CESAREAN SECTION    . CHOLECYSTECTOMY    . DILATION AND CURETTAGE OF UTERUS    . EMBOLECTOMY  10/10/2011   Procedure: EMBOLECTOMY;  Surgeon: Angelia Mould, MD;  Location: [place];  Service: Vascular;  Laterality: Right;  . FISTULOGRAM Right 04/12/2012  Procedure: FISTULOGRAM;  Surgeon: Angelia Mould, MD;  Location: [facility] CATH LAB;  Service: Cardiovascular;  Laterality: Right;  . IR DIALY SHUNT INTRO NEEDLE/INTRACATH INITIAL W/IMG LEFT Left 01/21/2019  . IR FLUORO GUIDE CV LINE LEFT  03/13/2017  . IR REMOVAL TUN CV CATH W/O FL  09/21/2017  . IR THROMBECTOMY AV FISTULA W/THROMBOLYSIS/PTA INC/SHUNT/IMG RIGHT Right 03/13/2017  . IR US GUIDE VASC ACCESS LEFT  03/13/2017  . IR US GUIDE VASC ACCESS RIGHT  03/13/2017  . SHUNTOGRAM Right 07/07/2011   Procedure: SHUNTOGRAM;  Surgeon:  Angelia Mould, MD;  Location: [facility] CATH LAB;  Service: Cardiovascular;  Laterality: Right;  . SHUNTOGRAM N/A 12/08/2011   Procedure: Earney Mallet;  Surgeon: Angelia Mould, MD;  Location: [facility] CATH LAB;  Service: Cardiovascular;  Laterality: N/A;  . TUBAL LIGATION       Current Meds  Medication Sig  . acetaminophen (TYLENOL) 500 MG tablet Take 500 mg by mouth every 6 (six) hours as needed for mild pain or moderate pain.  . ferric citrate (AURYXIA) 1 GM 210 MG(Fe) tablet Take 420-630 mg See admin instructions by mouth. 420 mg on Sun/Mon/Wed/Fri and 630 mg on Tues/Thurs/Sat  . hydroxychloroquine (PLAQUENIL) 200 MG tablet Take 1 tablet (200 mg total) by mouth daily.  Marland Kitchen levETIRAcetam (KEPPRA) 500 MG tablet Take 500 mg See admin instructions by mouth. 500 mg two times a day and an additional 500 mg on Tues/Thurs/Sat after dialysis  . traMADol (ULTRAM) 50 MG tablet Take 1 tablet (50 mg total) by mouth every 6 (six) hours as needed.  Marland Kitchen UNKNOWN TO PATIENT Take 2-3 capsules by mouth 3 (three) times daily with meals.     Allergies:   Patient has no known allergies.   Social History   Tobacco Use  . Smoking status: Never Smoker  . Smokeless tobacco: Never Used  Substance Use Topics  . Alcohol use: No  . Drug use: No     Family Hx: The patient's family history includes Diabetes in her brother, brother, maternal grandmother, and mother. There is no history of Anesthesia problems.  ROS:   Please see the history of present illness.     All other systems reviewed and are negative.   Prior CV studies:   The following studies were reviewed today:  Reviewed above  Labs/Other Tests and Data Reviewed:    EKG:  No ECG reviewed.  Recent Labs: 08/12/2019: ALT 24; BUN 48; Creatinine, Ser 8.83; Hemoglobin 11.0; Platelets 205; Potassium 3.7; Sodium 137   Recent Lipid Panel Lab Results  Component Value Date/Time   CHOL 186 01/11/2015 04:28 AM   TRIG 127 01/11/2015 04:28 AM   HDL 54  01/11/2015 04:28 AM   CHOLHDL 3.4 01/11/2015 04:28 AM   LDLCALC 107 (H) 01/11/2015 04:28 AM    Wt Readings from Last 3 Encounters:  09/05/19 165 lb (74.8 kg)  08/12/19 174 lb 2.6 oz (79 kg)  01/21/19 175 lb (79.4 kg)     Objective:    Vital Signs:  Ht 5' (1.524 m)   Wt 165 lb (74.8 kg)   BMI 32.22 kg/m    VITAL SIGNS:  reviewed  ASSESSMENT & PLAN:    1.  Chest pain: She said there is seldom in occurrence.  She currently denies chest pain.  I talked about obtaining a stress test but she refused.  2.  End-stage renal disease: She is on hemodialysis.  3.  Systemic lupus erythematosus: She is on Plaquenil. Followed by Dr. Veneta Penton at  [facility].    COVID-19 Education: The signs and symptoms of COVID-19 were discussed with the patient and how to seek care for testing (follow up with PCP or arrange E-visit).  The importance of social distancing was discussed today.  Time:   Today, I have spent 20 minutes with the patient with telehealth technology discussing the above problems.     Medication Adjustments/Labs and Tests Ordered: Current medicines are reviewed at length with the patient today.  Concerns regarding medicines are outlined above.   Tests Ordered: No orders of the defined types were placed in this encounter.   Medication Changes: No orders of the defined types were placed in this encounter.   Follow Up:  Virtual Visit  prn  Signed, Kate Sable, MD  09/05/2019 8:59 AM    [place]

## 2020-02-08 ENCOUNTER — Other Ambulatory Visit (HOSPITAL_COMMUNITY): Payer: Self-pay | Admitting: Nurse Practitioner

## 2020-02-08 ENCOUNTER — Other Ambulatory Visit: Payer: Self-pay | Admitting: Nurse Practitioner

## 2020-02-08 DIAGNOSIS — R102 Pelvic and perineal pain: Secondary | ICD-10-CM

## 2020-02-16 ENCOUNTER — Other Ambulatory Visit: Payer: Self-pay

## 2020-02-16 ENCOUNTER — Ambulatory Visit (HOSPITAL_COMMUNITY)
Admission: RE | Admit: 2020-02-16 | Discharge: 2020-02-16 | Disposition: A | Discharge status: Home / Self Care | Payer: Self-pay | Source: Ambulatory Visit | Attending: Nurse Practitioner | Admitting: Nurse Practitioner

## 2020-02-16 DIAGNOSIS — R102 Pelvic and perineal pain: Secondary | ICD-10-CM | POA: Insufficient documentation

## 2020-02-29 ENCOUNTER — Encounter: Payer: Self-pay | Admitting: Adult Health

## 2020-02-29 ENCOUNTER — Ambulatory Visit (INDEPENDENT_AMBULATORY_CARE_PROVIDER_SITE_OTHER): Payer: Self-pay | Admitting: Adult Health

## 2020-02-29 VITALS — BP 158/90 | HR 84 | Ht 66.0 in | Wt 178.0 lb

## 2020-02-29 DIAGNOSIS — N941 Unspecified dyspareunia: Secondary | ICD-10-CM

## 2020-02-29 DIAGNOSIS — Z8673 Personal history of transient ischemic attack (TIA), and cerebral infarction without residual deficits: Secondary | ICD-10-CM

## 2020-02-29 DIAGNOSIS — R1031 Right lower quadrant pain: Secondary | ICD-10-CM

## 2020-02-29 MED ORDER — CYCLOBENZAPRINE HCL 10 MG PO TABS: ORAL_TABLET | ORAL | 1 refills | Status: AC

## 2020-02-29 NOTE — Progress Notes (Signed)
°  Subjective:     Patient ID: Maria Vazquez, female   DOB: 12-23-1975, 44 y.o.   MRN: [id number]  HPI Maria Vazquez is a 44 year old Hispanic female, married, [phone] in complaining of pelvic pain esp right side, and feels burning inside, pain when walks and sometimes with sex. She had normal GYN  [place] 02/16/20 PCP is Dr Florene Glen. Maria Vazquez is the interrupter with her.   Review of Systems  Pelvic pain esp right side Pain with sex at times and pain with walking and burning sensation  Reviewed past medical,surgical, social and family history. Reviewed medications and allergies.     Objective:   Physical Exam BP (!) 158/90 (BP Location: Right Leg, Patient Position: Sitting, Cuff Size: Normal)    Pulse 84    Ht 5\' 6"  (1.676 m)    Wt 178 lb (80.7 kg)    LMP 02/04/2020    BMI 28.73 kg/m   Skin warm and dry.Pelvic: external genitalia is normal in appearance no lesions, vagina: white discharge without odor,urethra has no lesions or masses noted, cervix:smooth and bulbous, uterus: normal size, shape and contour, non tender, no masses felt, adnexa: no masses, has RLQ tenderness noted. Bladder is non tender and no masses felt.  Fall risk is low.   Told her [place] was normal,nothing to explain her pain.  Assessment:     1. RLQ abdominal pain Will try muscle relaxer Meds ordered this encounter  Medications   cyclobenzaprine (FLEXERIL) 10 MG tablet    Sig: Take 1 every 8 hours prn pelvic pain on the right    Dispense:  30 tablet    Refill:  1    Order Specific Question:   Supervising Provider    Answer:   Elonda Husky, LUTHER H [[id number]]    2. History of stroke   3. Dyspareunia, female     Plan:     Follow up in 4 weeks

## 2020-03-28 ENCOUNTER — Ambulatory Visit: Payer: Self-pay | Admitting: Adult Health

## 2020-04-03 ENCOUNTER — Ambulatory Visit: Payer: Self-pay | Admitting: Adult Health

## 2020-08-13 ENCOUNTER — Other Ambulatory Visit (HOSPITAL_COMMUNITY): Payer: Self-pay | Admitting: Nephrology

## 2020-08-13 DIAGNOSIS — N186 End stage renal disease: Secondary | ICD-10-CM

## 2020-08-16 ENCOUNTER — Other Ambulatory Visit: Payer: Self-pay | Admitting: Radiology

## 2020-08-20 ENCOUNTER — Other Ambulatory Visit (HOSPITAL_COMMUNITY): Payer: Self-pay | Admitting: Nephrology

## 2020-08-20 ENCOUNTER — Other Ambulatory Visit: Payer: Self-pay

## 2020-08-20 ENCOUNTER — Ambulatory Visit (HOSPITAL_COMMUNITY)
Admission: RE | Admit: 2020-08-20 | Discharge: 2020-08-20 | Disposition: A | Discharge status: Home / Self Care | Payer: Self-pay | Source: Ambulatory Visit | Attending: Nephrology | Admitting: Nephrology

## 2020-08-20 DIAGNOSIS — N186 End stage renal disease: Secondary | ICD-10-CM

## 2020-08-20 DIAGNOSIS — I132 Hypertensive heart and chronic kidney disease with heart failure and with stage 5 chronic kidney disease, or end stage renal disease: Secondary | ICD-10-CM | POA: Insufficient documentation

## 2020-08-20 DIAGNOSIS — Z992 Dependence on renal dialysis: Secondary | ICD-10-CM | POA: Insufficient documentation

## 2020-08-20 DIAGNOSIS — G40909 Epilepsy, unspecified, not intractable, without status epilepticus: Secondary | ICD-10-CM | POA: Insufficient documentation

## 2020-08-20 DIAGNOSIS — Z9049 Acquired absence of other specified parts of digestive tract: Secondary | ICD-10-CM | POA: Insufficient documentation

## 2020-08-20 DIAGNOSIS — I509 Heart failure, unspecified: Secondary | ICD-10-CM | POA: Insufficient documentation

## 2020-08-20 DIAGNOSIS — Z79899 Other long term (current) drug therapy: Secondary | ICD-10-CM | POA: Insufficient documentation

## 2020-08-20 DIAGNOSIS — E1122 Type 2 diabetes mellitus with diabetic chronic kidney disease: Secondary | ICD-10-CM | POA: Insufficient documentation

## 2020-08-20 DIAGNOSIS — Z883 Allergy status to other anti-infective agents status: Secondary | ICD-10-CM | POA: Insufficient documentation

## 2020-08-20 DIAGNOSIS — Z8673 Personal history of transient ischemic attack (TIA), and cerebral infarction without residual deficits: Secondary | ICD-10-CM | POA: Insufficient documentation

## 2020-08-20 DIAGNOSIS — N179 Acute kidney failure, unspecified: Secondary | ICD-10-CM | POA: Insufficient documentation

## 2020-08-20 HISTORY — PX: IR DIALY SHUNT INTRO NEEDLE/INTRACATH INITIAL W/IMG LEFT: IMG6102

## 2020-08-20 HISTORY — PX: IR US GUIDE VASC ACCESS LEFT: IMG2389

## 2020-08-20 LAB — HCG, SERUM, QUALITATIVE: Preg, Serum: NEGATIVE

## 2020-08-20 MED ORDER — FENTANYL CITRATE (PF) 100 MCG/2ML IJ SOLN
INTRAMUSCULAR | Status: AC
  Filled 2020-08-20: qty 2

## 2020-08-20 MED ORDER — SODIUM CHLORIDE 0.9 % IV SOLN: INTRAVENOUS | Status: DC

## 2020-08-20 MED ORDER — LIDOCAINE HCL 1 % IJ SOLN
INTRAMUSCULAR | Status: AC
  Filled 2020-08-20: qty 20

## 2020-08-20 MED ORDER — LIDOCAINE HCL 1 % IJ SOLN
INTRAMUSCULAR | Status: AC | PRN
  Administered 2020-08-20: 5 mL

## 2020-08-20 MED ORDER — MIDAZOLAM HCL 2 MG/2ML IJ SOLN
INTRAMUSCULAR | Status: AC
  Filled 2020-08-20: qty 4

## 2020-08-20 MED ORDER — IOHEXOL 300 MG/ML  SOLN
100.0000 mL | Freq: Once | INTRAMUSCULAR | Status: AC | PRN
  Administered 2020-08-20: 20 mL via INTRAVENOUS

## 2020-08-20 NOTE — Sedation Documentation (Signed)
Images obtained by Dr. Pascal Lux. No intervention required. Will d/c pt to home.

## 2020-08-20 NOTE — Procedures (Signed)
Pre-procedure Diagnosis: ESRD, poorly functioning AV fistula Post-procedure Diagnosis: Same  Normally functioning left upper arm AV fistula without peripheral or central stenosis.  No intervention performed.   Complications: None Immediate EBL: None  Ronny Bacon, MD Pager #: [phone]

## 2020-08-20 NOTE — H&P (Signed)
Chief Complaint: Patient was seen in consultation today for AV fistula malfunction/fistulagram with possible intervention.  Referring Physician(s): Coladonato,Joseph (nephrology)  Supervising Physician: Sandi Mariscal  Patient Status: [facility] - Out-pt  History of Present Illness: Maria Vazquez is a 45 y.o. female with a past medical history of hypertension, HF, CVA, seizures, gallstone pancreatitis, lupus, lupus nephritis, ESRD on HD, anemia, and thrombocytopenia. She is known to IR- she underwent an image-guided fistulagram in our department 01/20/2021. She usually undergoes dialysis on Tuesdays, Thursdays, and Saturdays. She completed dialysis on Saturday 08/18/2020, however per patient they "pulled clots out of fistula" during session.  IR consulted by Dr. Marval Regal for possible image-guided fistulagram with possible intervention. Patient awake and alert laying in bed with no complaints at this time. Denies fever, chills, chest pain, dyspnea, abdominal pain, or headache.   Past Medical History:  Diagnosis Date  . Anemia   . Blood transfusion   . Cervix carcinoma in situ Mar 2011  . CHF (congestive heart failure) ([place])   . Chronic kidney disease    T, Th, Sat  . Dialysis patient [facility])    tues, thursday, saturday.  . Gallstone pancreatitis Feb 2011  . Hypertension   . Lupus ([place])   . Lupus nephritis ([place])   . Pneumonia due to organism 03/02/2011  . Proteinuria - cause not known   . Renal failure, acute on chronic (HCC)   . Renal insufficiency   . Seizure disorder ([place]) 03/19/2013  . Stroke [facility])    no residual   . Thrombocytopenia ([place])     Past Surgical History:  Procedure Laterality Date  . APPENDECTOMY    . AV FISTULA PLACEMENT    . AV FISTULA PLACEMENT  07/18/2011   Procedure: ARTERIOVENOUS (AV) FISTULA CREATION;  Surgeon: Angelia Mould, MD;  Location: [place];  Service: Vascular;  Laterality: Right;  . AV FISTULA PLACEMENT  01/23/12   Revision of Right  AVF  . AV FISTULA PLACEMENT Left 04/15/2017   Procedure: left brachiocephailc ARTERIOVENOUS (AV) FISTULA CREATION;  Surgeon: Conrad Cayuse, MD;  Location: [place];  Service: Vascular;  Laterality: Left;  . CERVICAL CONE BIOPSY    . CESAREAN SECTION    . CHOLECYSTECTOMY    . DILATION AND CURETTAGE OF UTERUS    . EMBOLECTOMY  10/10/2011   Procedure: EMBOLECTOMY;  Surgeon: Angelia Mould, MD;  Location: [place];  Service: Vascular;  Laterality: Right;  . FISTULOGRAM Right 04/12/2012   Procedure: FISTULOGRAM;  Surgeon: Angelia Mould, MD;  Location: [facility] CATH LAB;  Service: Cardiovascular;  Laterality: Right;  . IR DIALY SHUNT INTRO NEEDLE/INTRACATH INITIAL W/IMG LEFT Left 01/21/2019  . IR FLUORO GUIDE CV LINE LEFT  03/13/2017  . IR REMOVAL TUN CV CATH W/O FL  09/21/2017  . IR THROMBECTOMY AV FISTULA W/THROMBOLYSIS/PTA INC/SHUNT/IMG RIGHT Right 03/13/2017  . IR US GUIDE VASC ACCESS LEFT  03/13/2017  . IR US GUIDE VASC ACCESS RIGHT  03/13/2017  . SHUNTOGRAM Right 07/07/2011   Procedure: SHUNTOGRAM;  Surgeon: Angelia Mould, MD;  Location: [facility] CATH LAB;  Service: Cardiovascular;  Laterality: Right;  . SHUNTOGRAM N/A 12/08/2011   Procedure: Earney Mallet;  Surgeon: Angelia Mould, MD;  Location: [facility] CATH LAB;  Service: Cardiovascular;  Laterality: N/A;  . TUBAL LIGATION      Allergies: Patient has no known allergies.  Medications: Prior to Admission medications   Medication Sig Start Date End Date Taking? Authorizing Provider  acetaminophen (TYLENOL) 500 MG tablet Take 500 mg  by mouth every 6 (six) hours as needed for mild pain or moderate pain.   Yes [provider]  amLODipine (NORVASC) 10 MG tablet Take 10 mg by mouth at bedtime.   Yes [provider]  bismuth subsalicylate (PEPTO BISMOL) 262 MG/15ML suspension Take 30 mLs by mouth every 6 (six) hours as needed for indigestion.   Yes [provider]  diphenhydrAMINE (BENADRYL) 25 MG tablet Take 25 mg  by mouth every 6 (six) hours as needed for allergies.   Yes [provider]  ferric citrate (AURYXIA) 1 GM 210 MG(Fe) tablet Take 840 mg by mouth 3 (three) times daily with meals.   Yes [provider]  hydroxychloroquine (PLAQUENIL) 200 MG tablet Take 1 tablet (200 mg total) by mouth daily. Patient taking differently: Take 200 mg by mouth every other day. 01/13/15  Yes Kathie Dike, MD  cyclobenzaprine (FLEXERIL) 10 MG tablet Take 1 every 8 hours prn pelvic pain on the right Patient not taking: Reported on 08/14/2020 02/29/20   Estill Dooms, NP     Family History  Problem Relation Age of Onset  . Diabetes Mother   . Diabetes Brother   . Diabetes Brother   . Diabetes Maternal Grandmother   . Anesthesia problems Neg Hx     Social History   Socioeconomic History  . Marital status: Married    Spouse name: Not on file  . Number of children: Not on file  . Years of education: Not on file  . Highest education level: Not on file  Occupational History  . Not on file  Tobacco Use  . Smoking status: Never Smoker  . Smokeless tobacco: Never Used  Vaping Use  . Vaping Use: Never used  Substance and Sexual Activity  . Alcohol use: No  . Drug use: No  . Sexual activity: Yes    Birth control/protection: Condom, Surgical    Comment: tubal   Other Topics Concern  . Not on file  Social History Narrative  . Not on file   Social Determinants of Health   Financial Resource Strain: Not on file  Food Insecurity: Not on file  Transportation Needs: Not on file  Physical Activity: Not on file  Stress: Not on file  Social Connections: Not on file     Review of Systems: A 12 point ROS discussed and pertinent positives are indicated in the HPI above.  All other systems are negative.  Review of Systems  Constitutional: Negative for chills and fever.  Respiratory: Negative for shortness of breath and wheezing.   Cardiovascular: Negative for chest pain and  palpitations.  Gastrointestinal: Negative for abdominal pain.  Neurological: Negative for headaches.  Psychiatric/Behavioral: Negative for behavioral problems and confusion.    Vital Signs: BP (!) 161/87 Comment: r leg  Pulse 63   Temp 98.3 F (36.8 C)   Ht '5\' 6"'$  (1.676 m)   Wt 177 lb 14.6 oz (80.7 kg)   SpO2 100%   BMI 28.72 kg/m   Physical Exam Vitals and nursing note reviewed.  Constitutional:      General: She is not in acute distress.    Appearance: Normal appearance.  Cardiovascular:     Rate and Rhythm: Normal rate and regular rhythm.     Heart sounds: Normal heart sounds. No murmur heard.   Pulmonary:     Effort: Pulmonary effort is normal. No respiratory distress.     Breath sounds: Normal breath sounds. No wheezing.  Skin:  General: Skin is warm and dry.     Comments: LIUE at site of AV fistula without tenderness, erythema, drainage, or active bleeding; thrill palpable; bruit heard on ascultation.  Neurological:     Mental Status: She is alert and oriented to person, place, and time.      MD Evaluation Airway: WNL Heart: WNL Abdomen: WNL Chest/ Lungs: WNL ASA  Classification: 3 Mallampati/Airway Score: Two   Imaging: No results found.  Labs:  CBC: No results for input(s): WBC, HGB, HCT, PLT in the last 8760 hours.  COAGS: No results for input(s): INR, APTT in the last 8760 hours.  BMP: No results for input(s): NA, K, CL, CO2, GLUCOSE, BUN, CALCIUM, CREATININE, GFRNONAA, GFRAA in the last 8760 hours.  Invalid input(s): CMP  LIVER FUNCTION TESTS: No results for input(s): BILITOT, AST, ALT, ALKPHOS, PROT, ALBUMIN in the last 8760 hours.  TUMOR MARKERS: No results for input(s): AFPTM, CEA, CA199, CHROMGRNA in the last 8760 hours.  Assessment and Plan:  ESRD on HD via LUE AVF, with malfunction of fistula (per patient "pulling clots from fistula"). Plan for image-guided fistulagram with possible intervention (with possible image-guided  tunneled HD catheter placement) today in IR. Patient is NPO. Afebrile.  Quincy Simmonds, Medical Interpretor, was present throughout today's interaction.  Risks and benefits discussed with the patient including, but not limited to bleeding, infection, vascular injury, pulmonary embolism, need for tunneled HD catheter placement or even death. All of the patient's questions were answered, patient is agreeable to proceed. Consent signed and in chart.   Thank you for this interesting consult.  I greatly enjoyed meeting Arnell Porath and look forward to participating in their care.  A copy of this report was sent to the requesting provider on this date.  Electronically Signed: Earley Abide, PA-C 08/20/2020, 8:41 AM   I spent a total of 25 Minutes in face to face in clinical consultation, greater than 50% of which was counseling/coordinating care for AV fistula malfunction/fistulagram with possible intervention.

## 2021-03-30 ENCOUNTER — Emergency Department (HOSPITAL_COMMUNITY): Payer: No Typology Code available for payment source

## 2021-03-30 ENCOUNTER — Other Ambulatory Visit: Payer: Self-pay

## 2021-03-30 ENCOUNTER — Emergency Department (HOSPITAL_COMMUNITY)
Admission: EM | Admit: 2021-03-30 | Discharge: 2021-03-30 | Disposition: A | Discharge status: Home / Self Care | Payer: No Typology Code available for payment source | Attending: Emergency Medicine | Admitting: Emergency Medicine

## 2021-03-30 ENCOUNTER — Encounter (HOSPITAL_COMMUNITY): Payer: Self-pay | Admitting: Emergency Medicine

## 2021-03-30 DIAGNOSIS — N186 End stage renal disease: Secondary | ICD-10-CM | POA: Diagnosis not present

## 2021-03-30 DIAGNOSIS — I132 Hypertensive heart and chronic kidney disease with heart failure and with stage 5 chronic kidney disease, or end stage renal disease: Secondary | ICD-10-CM | POA: Diagnosis not present

## 2021-03-30 DIAGNOSIS — S299XXA Unspecified injury of thorax, initial encounter: Secondary | ICD-10-CM | POA: Insufficient documentation

## 2021-03-30 DIAGNOSIS — Y92410 Unspecified street and highway as the place of occurrence of the external cause: Secondary | ICD-10-CM | POA: Insufficient documentation

## 2021-03-30 DIAGNOSIS — Z79899 Other long term (current) drug therapy: Secondary | ICD-10-CM | POA: Insufficient documentation

## 2021-03-30 DIAGNOSIS — Z992 Dependence on renal dialysis: Secondary | ICD-10-CM | POA: Diagnosis not present

## 2021-03-30 DIAGNOSIS — S8002XA Contusion of left knee, initial encounter: Secondary | ICD-10-CM | POA: Diagnosis not present

## 2021-03-30 DIAGNOSIS — I509 Heart failure, unspecified: Secondary | ICD-10-CM | POA: Insufficient documentation

## 2021-03-30 DIAGNOSIS — S8992XA Unspecified injury of left lower leg, initial encounter: Secondary | ICD-10-CM | POA: Diagnosis present

## 2021-03-30 MED ORDER — OXYCODONE-ACETAMINOPHEN 5-325 MG PO TABS
2.0000 | ORAL_TABLET | Freq: Once | ORAL | Status: AC
  Administered 2021-03-30: 2 via ORAL
  Filled 2021-03-30: qty 2

## 2021-03-30 MED ORDER — KETOROLAC TROMETHAMINE 60 MG/2ML IM SOLN
30.0000 mg | Freq: Once | INTRAMUSCULAR | Status: AC
  Administered 2021-03-30: 30 mg via INTRAMUSCULAR
  Filled 2021-03-30: qty 2
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
## 2022-02-20 ENCOUNTER — Emergency Department (HOSPITAL_COMMUNITY): Payer: Self-pay

## 2022-02-20 ENCOUNTER — Emergency Department (HOSPITAL_COMMUNITY)
Admission: EM | Admit: 2022-02-20 | Discharge: 2022-02-20 | Disposition: A | Discharge status: Home / Self Care | Payer: Self-pay | Attending: Emergency Medicine | Admitting: Emergency Medicine

## 2022-02-20 ENCOUNTER — Other Ambulatory Visit: Payer: Self-pay

## 2022-02-20 ENCOUNTER — Encounter (HOSPITAL_COMMUNITY): Payer: Self-pay

## 2022-02-20 DIAGNOSIS — Z992 Dependence on renal dialysis: Secondary | ICD-10-CM | POA: Insufficient documentation

## 2022-02-20 DIAGNOSIS — I509 Heart failure, unspecified: Secondary | ICD-10-CM | POA: Insufficient documentation

## 2022-02-20 DIAGNOSIS — R0602 Shortness of breath: Secondary | ICD-10-CM | POA: Insufficient documentation

## 2022-02-20 DIAGNOSIS — R079 Chest pain, unspecified: Secondary | ICD-10-CM | POA: Insufficient documentation

## 2022-02-20 DIAGNOSIS — N186 End stage renal disease: Secondary | ICD-10-CM | POA: Insufficient documentation

## 2022-02-20 DIAGNOSIS — R059 Cough, unspecified: Secondary | ICD-10-CM | POA: Insufficient documentation

## 2022-02-20 LAB — COMPREHENSIVE METABOLIC PANEL
ALT: 46 U/L — ABNORMAL HIGH (ref 0–44)
AST: 34 U/L (ref 15–41)
Albumin: 3.7 g/dL (ref 3.5–5.0)
Alkaline Phosphatase: 86 U/L (ref 38–126)
Anion gap: 13 (ref 5–15)
BUN: 18 mg/dL (ref 6–20)
CO2: 29 mmol/L (ref 22–32)
Calcium: 8.5 mg/dL — ABNORMAL LOW (ref 8.9–10.3)
Chloride: 95 mmol/L — ABNORMAL LOW (ref 98–111)
Creatinine, Ser: 5.85 mg/dL — ABNORMAL HIGH (ref 0.44–1.00)
GFR, Estimated: 9 mL/min — ABNORMAL LOW (ref >60)
Glucose, Bld: 93 mg/dL (ref 70–99)
Potassium: 3 mmol/L — ABNORMAL LOW (ref 3.5–5.1)
Sodium: 137 mmol/L (ref 135–145)
Total Bilirubin: 0.9 mg/dL (ref 0.3–1.2)
Total Protein: 7.2 g/dL (ref 6.5–8.1)

## 2022-02-20 LAB — TROPONIN I (HIGH SENSITIVITY)
Troponin I (High Sensitivity): 8 ng/L (ref <18)
Troponin I (High Sensitivity): 9 ng/L (ref <18)

## 2022-02-20 LAB — CBC
HCT: 36.4 % (ref 36.0–46.0)
Hemoglobin: 12.1 g/dL (ref 12.0–15.0)
MCH: 31.3 pg (ref 26.0–34.0)
MCHC: 33.2 g/dL (ref 30.0–36.0)
MCV: 94.3 fL (ref 80.0–100.0)
Platelets: 154 10*3/uL (ref 150–400)
RBC: 3.86 MIL/uL — ABNORMAL LOW (ref 3.87–5.11)
RDW: 13.1 % (ref 11.5–15.5)
WBC: 6.6 10*3/uL (ref 4.0–10.5)
nRBC: 0 % (ref 0.0–0.2)

## 2022-02-20 MED ORDER — ASPIRIN 81 MG PO CHEW
324.0000 mg | CHEWABLE_TABLET | Freq: Once | ORAL | Status: AC
  Administered 2022-02-20: 324 mg via ORAL
  Filled 2022-02-20: qty 4

## 2022-02-20 MED ORDER — IOHEXOL 350 MG/ML SOLN
75.0000 mL | Freq: Once | INTRAVENOUS | Status: AC | PRN
  Administered 2022-02-20: 75 mL via INTRAVENOUS

## 2022-02-20 NOTE — Discharge Instructions (Addendum)
You were seen in the ED for chest pain.  Your test results are reassuring for a nonheart related cause for your chest pain.  Continue to treat discomfort or pain at home with Tylenol.  Continue attending dialysis on schedule.  If you develop new or worsening symptoms, return to ED for evaluation and treatment.

## 2022-02-20 NOTE — ED Notes (Signed)
Dialysis RN deaccessed pts catheter. Pressure was held x 10 min. Then sat with no pressure x 1 min with no bleeding. Pt was d/c with nad.

## 2022-02-20 NOTE — ED Provider Notes (Signed)
Patient arrives from dialysis with chest pain after having possible small amount of air in the tubing, oxygen was initially low at 92% but improved, on my exam the patient is in no distress, states that she is feeling better.  I discussed the case with Dr. Harrington Challenger of the cardiology service who recommends that the patient can be treated clinically symptomatically and does not need to have inpatient treatment for what appears to be a noncardiac cause of her symptoms.  She has no known history of coronary disease and EKG does not show any signs of ischemia.  Work-up has been negative including CT angiogram of the chest.  Medical screening examination/treatment/procedure(s) were conducted as a shared visit with non-physician practitioner(s) and myself.  I personally evaluated the patient during the encounter.  Clinical Impression:   Final diagnoses:  None         Maria Chapel, MD 02/21/22 1231

## 2022-02-20 NOTE — ED Triage Notes (Signed)
Pt presents to ED via [facility] from dialysis. Pt was getting her dialysis treatment, dialysis nurse states patient received air through tubing, unsure how much. Pt started having constant cough, sudden onset of chest pain. O2 sat initially 92% on RA, placed on 3L.  Pt did not finish dialysis treatment only there 25 minutes. Pt c/o mid chest pain, states feels heavy

## 2022-02-20 NOTE — ED Provider Notes (Signed)
[facility] EMERGENCY DEPARTMENT Provider Note   CSN: [id number] Arrival date & time: 02/20/22  1027     History  Chief Complaint  Patient presents with   Chest Pain    Maria Vazquez is a 46 y.o. female with a history of SLE, CHF, thrombocytopenia, CKD requiring dialysis presents to the ED complaining of chest heaviness and shortness of breath.  Patient states she was at dialysis when her symptoms began.  She states after access to her AV fistula and 2 filter changes she began to have chest heaviness.  Patient did not complete dialysis and was only dialyzed for approximately 25 minutes.  She states her symptoms are worse when she takes a deep breath also leads to episodes of a dry cough.  Denies palpitations, lightheadedness, syncope, fever, chills, nausea, vomiting.   Spanish interpreter was used to obtain HPI.       Home Medications Prior to Admission medications   Medication Sig Start Date End Date Taking? Authorizing Provider  acetaminophen (TYLENOL) 500 MG tablet Take 500 mg by mouth every 6 (six) hours as needed for mild pain or moderate pain.   Yes [provider]  bismuth subsalicylate (PEPTO BISMOL) 262 MG/15ML suspension Take 30 mLs by mouth every 6 (six) hours as needed for indigestion.   Yes [provider]  diphenhydrAMINE (BENADRYL) 25 MG tablet Take 25 mg by mouth every 6 (six) hours as needed for allergies.   Yes [provider]  ferric citrate (AURYXIA) 1 GM 210 MG(Fe) tablet Take 840 mg by mouth 3 (three) times daily with meals.   Yes [provider]  amLODipine (NORVASC) 10 MG tablet Take 10 mg by mouth at bedtime. Patient not taking: Reported on 02/20/2022    [provider]  cyclobenzaprine (FLEXERIL) 10 MG tablet Take 1 every 8 hours prn pelvic pain on the right Patient not taking: Reported on 08/14/2020 02/29/20   Estill Dooms, NP  hydroxychloroquine (PLAQUENIL) 200 MG tablet Take 1 tablet (200 mg  total) by mouth daily. Patient not taking: Reported on 02/20/2022 01/13/15   Kathie Dike, MD      Allergies    Patient has no known allergies.    Review of Systems   Review of Systems  Constitutional:  Negative for diaphoresis.  Respiratory:  Positive for cough and shortness of breath.   Cardiovascular:  Positive for chest pain.       Chest heaviness    Physical Exam Updated Vital Signs BP 134/87   Pulse 65   Temp 98 F (36.7 C) (Oral)   Resp 19   Ht 5\' 6"  (1.676 m)   Wt 80.3 kg   SpO2 97%   BMI 28.57 kg/m  Physical Exam Vitals and nursing note reviewed.  Constitutional:      General: She is not in acute distress.    Appearance: She is not ill-appearing or diaphoretic.  HENT:     Mouth/Throat:     Mouth: Mucous membranes are moist.     Pharynx: Oropharynx is clear.  Cardiovascular:     Rate and Rhythm: Normal rate and regular rhythm.     Pulses: Normal pulses.          Radial pulses are 2+ on the right side and 2+ on the left side.     Heart sounds: Normal heart sounds.  Pulmonary:     Effort: Pulmonary effort is normal. No tachypnea or respiratory distress.     Breath sounds: Normal breath sounds.  Chest:     Chest wall: No tenderness.  Abdominal:     General: Abdomen is flat. Bowel sounds are normal. There is no distension.     Palpations: Abdomen is soft.     Tenderness: There is no abdominal tenderness.  Skin:    General: Skin is warm and dry.     Capillary Refill: Capillary refill takes less than 2 seconds.  Neurological:     Mental Status: She is alert. Mental status is at baseline.  Psychiatric:        Mood and Affect: Mood normal.        Behavior: Behavior normal.     ED Results / Procedures / Treatments   Labs (all labs ordered are listed, but only abnormal results are displayed) Labs Reviewed  CBC - Abnormal; Notable for the following components:      Result Value   RBC 3.86 (*)    All other components within normal limits   COMPREHENSIVE METABOLIC PANEL - Abnormal; Notable for the following components:   Potassium 3.0 (*)    Chloride 95 (*)    Creatinine, Ser 5.85 (*)    Calcium 8.5 (*)    ALT 46 (*)    GFR, Estimated 9 (*)    All other components within normal limits  TROPONIN I (HIGH SENSITIVITY)  TROPONIN I (HIGH SENSITIVITY)    EKG EKG Interpretation  Date/Time:  Thursday February 20 2022 10:33:09 EDT Ventricular Rate:  74 PR Interval:  135 QRS Duration: 94 QT Interval:  441 QTC Calculation: 490 R Axis:   53 Text Interpretation: Sinus rhythm Borderline prolonged QT interval no other acute findings Confirmed by Noemi Chapel [phone]) on 02/20/2022 10:41:08 AM  Radiology DG Chest Port 1 View  Result Date: 02/20/2022 CLINICAL DATA:  Pt presents to ED via [facility] from dialysis. Pt was getting her dialysis treatment, dialysis nurse states patient received air through tubing, unsure how much. Pt started having constant cough, sudden onset of che.*comment was truncated*chest pain EXAM: PORTABLE CHEST 1 VIEW COMPARISON:  03/30/2021 FINDINGS: Lordotic positioning. Normal mediastinum and cardiac silhouette. Normal pulmonary vasculature. No evidence of effusion, infiltrate, or pneumothorax. No acute bony abnormality. IMPRESSION: No acute cardiopulmonary process. Electronically Signed   By: Suzy Bouchard M.D.   On: 02/20/2022 11:25    Procedures Procedures    Medications Ordered in ED Medications  aspirin chewable tablet 324 mg (324 mg Oral Given 02/20/22 1116)    ED Course/ Medical Decision Making/ A&P                           Medical Decision Making Amount and/or Complexity of Data Reviewed External Data Reviewed: notes. Labs: ordered. Radiology: ordered.  Risk OTC drugs. Prescription drug management.   This patient presents to the ED for concern of chest pain during dialysis, this involves an extensive number of treatment options, and is a complaint that carries with it a  high risk of complications and morbidity.  The differential diagnosis includes PE, myocardial ischemia, ACS.   Co morbidities that complicate the patient evaluation  ESRD requiring dialysis, CHF, SLE   Lab Tests:  I Ordered, and personally interpreted labs.  The pertinent results include: Potassium 3.0, creatinine 5.85 (patient has ESRD)   Imaging Studies ordered:  I ordered imaging studies including chest x-ray, CT angio chest PE I independently visualized and interpreted imaging which showed no acute abnormalities, normal heart silhouette, no evidence of pneumothorax or  pleural effusion.  I agree with the radiologist interpretation   Cardiac Monitoring: / EKG:  The patient was maintained on a cardiac monitor.  I personally viewed and interpreted the cardiac monitored which showed an underlying rhythm of: Sinus rhythm with borderline prolonged QT interval   Consultations Obtained:  I requested consultation with the cardiology,  and discussed lab and imaging findings as well as pertinent plan - they recommend: Patient can be treated clinically symptomatically, does not require inpatient treatment at this time   Problem List / ED Course / Critical interventions / Medication management  Chest pain I ordered medication including ASA per chest pain protocol  Reevaluation of the patient after these medicines showed that the patient improved I have reviewed the patients home medicines and have made adjustments as needed   Social Determinants of Health:  Language barrier  Discussed assessment and plan with Dr. Sabra Heck who agreed.  Patient symptoms have improved while in ED.  Based on examination and negative CTA, this appears to be a noncardiac cause of her symptoms.  She has no history of coronary disease, EKG does not show signs of ischemia or infarction.  Cardiology was consulted and agrees patient can be treated symptomatically and managed outpatient.  We will discharge patient  to home with return precautions.  Patient will be advised to continue dialysis as scheduled.        Final Clinical Impression(s) / ED Diagnoses Final diagnoses:  None    Rx / DC Orders ED Discharge Orders     None         Pat Kocher, [place] 02/20/22 1516    Noemi Chapel, MD 02/21/22 1232

## 2023-02-09 ENCOUNTER — Other Ambulatory Visit (HOSPITAL_COMMUNITY): Payer: Self-pay | Admitting: Nurse Practitioner

## 2023-02-09 ENCOUNTER — Other Ambulatory Visit: Payer: Self-pay | Admitting: Nurse Practitioner

## 2023-02-09 DIAGNOSIS — Z1231 Encounter for screening mammogram for malignant neoplasm of breast: Secondary | ICD-10-CM

## 2023-02-09 DIAGNOSIS — N838 Other noninflammatory disorders of ovary, fallopian tube and broad ligament: Secondary | ICD-10-CM

## 2023-03-09 ENCOUNTER — Ambulatory Visit (HOSPITAL_COMMUNITY)
Admission: RE | Admit: 2023-03-09 | Discharge: 2023-03-09 | Disposition: A | Discharge status: Home / Self Care | Payer: Self-pay | Source: Ambulatory Visit | Attending: Nurse Practitioner | Admitting: Nurse Practitioner

## 2023-03-09 DIAGNOSIS — N838 Other noninflammatory disorders of ovary, fallopian tube and broad ligament: Secondary | ICD-10-CM

## 2023-03-09 DIAGNOSIS — Z1231 Encounter for screening mammogram for malignant neoplasm of breast: Secondary | ICD-10-CM | POA: Insufficient documentation

## 2023-04-26 ENCOUNTER — Other Ambulatory Visit: Payer: Self-pay

## 2023-04-26 ENCOUNTER — Emergency Department (HOSPITAL_COMMUNITY)
Admission: EM | Admit: 2023-04-26 | Discharge: 2023-04-26 | Disposition: A | Discharge status: Home / Self Care | Payer: Self-pay | Attending: Emergency Medicine | Admitting: Emergency Medicine

## 2023-04-26 ENCOUNTER — Emergency Department (HOSPITAL_COMMUNITY): Payer: Self-pay

## 2023-04-26 ENCOUNTER — Encounter (HOSPITAL_COMMUNITY): Payer: Self-pay

## 2023-04-26 DIAGNOSIS — N186 End stage renal disease: Secondary | ICD-10-CM | POA: Insufficient documentation

## 2023-04-26 DIAGNOSIS — K2100 Gastro-esophageal reflux disease with esophagitis, without bleeding: Secondary | ICD-10-CM | POA: Insufficient documentation

## 2023-04-26 DIAGNOSIS — Z992 Dependence on renal dialysis: Secondary | ICD-10-CM | POA: Insufficient documentation

## 2023-04-26 LAB — TROPONIN I (HIGH SENSITIVITY)
Troponin I (High Sensitivity): 12 ng/L (ref <18)
Troponin I (High Sensitivity): 13 ng/L (ref <18)

## 2023-04-26 LAB — HEPATIC FUNCTION PANEL
ALT: 34 U/L (ref 0–44)
AST: 25 U/L (ref 15–41)
Albumin: 3.7 g/dL (ref 3.5–5.0)
Alkaline Phosphatase: 90 U/L (ref 38–126)
Bilirubin, Direct: <0.1 mg/dL (ref 0.0–0.2)
Total Bilirubin: 0.6 mg/dL (ref <1.2)
Total Protein: 7.1 g/dL (ref 6.5–8.1)

## 2023-04-26 LAB — CBC
HCT: 41.7 % (ref 36.0–46.0)
Hemoglobin: 13.8 g/dL (ref 12.0–15.0)
MCH: 32 pg (ref 26.0–34.0)
MCHC: 33.1 g/dL (ref 30.0–36.0)
MCV: 96.8 fL (ref 80.0–100.0)
Platelets: 208 10*3/uL (ref 150–400)
RBC: 4.31 MIL/uL (ref 3.87–5.11)
RDW: 13.1 % (ref 11.5–15.5)
WBC: 8.4 10*3/uL (ref 4.0–10.5)
nRBC: 0 % (ref 0.0–0.2)

## 2023-04-26 LAB — BASIC METABOLIC PANEL
Anion gap: 15 (ref 5–15)
BUN: 42 mg/dL — ABNORMAL HIGH (ref 6–20)
CO2: 28 mmol/L (ref 22–32)
Calcium: 9.7 mg/dL (ref 8.9–10.3)
Chloride: 98 mmol/L (ref 98–111)
Creatinine, Ser: 7.49 mg/dL — ABNORMAL HIGH (ref 0.44–1.00)
GFR, Estimated: 6 mL/min — ABNORMAL LOW (ref >60)
Glucose, Bld: 103 mg/dL — ABNORMAL HIGH (ref 70–99)
Potassium: 4.2 mmol/L (ref 3.5–5.1)
Sodium: 141 mmol/L (ref 135–145)

## 2023-04-26 MED ORDER — PANTOPRAZOLE SODIUM 20 MG PO TBEC: 20.0000 mg | DELAYED_RELEASE_TABLET | Freq: Every day | ORAL | 0 refills | Status: AC

## 2023-04-26 MED ORDER — HYDROMORPHONE HCL 1 MG/ML IJ SOLN
0.5000 mg | Freq: Once | INTRAMUSCULAR | Status: AC
  Administered 2023-04-26: 0.5 mg via INTRAVENOUS
  Filled 2023-04-26: qty 0.5

## 2023-04-26 MED ORDER — PANTOPRAZOLE SODIUM 40 MG IV SOLR
40.0000 mg | Freq: Once | INTRAVENOUS | Status: AC
  Administered 2023-04-26: 40 mg via INTRAVENOUS
  Filled 2023-04-26: qty 10

## 2023-04-26 NOTE — ED Provider Notes (Signed)
EMERGENCY DEPARTMENT AT [facility] Provider Note   CSN: [id number] Arrival date & time: 04/26/23  1936     History {Add pertinent medical, surgical, social history, OB history to HPI:1} Chief Complaint  Patient presents with   Chest Pain    Maria Vazquez is a 47 y.o. female.  Patient has a history of renal disease and gets dialysis Saturday Tuesday Thursday.  She is complaining of epigastric discomfort   Chest Pain      Home Medications Prior to Admission medications   Medication Sig Start Date End Date Taking? Authorizing Provider  pantoprazole (PROTONIX) 20 MG tablet Take 1 tablet (20 mg total) by mouth daily. 04/26/23  Yes Bethann Berkshire, MD  acetaminophen (TYLENOL) 500 MG tablet Take 500 mg by mouth every 6 (six) hours as needed for mild pain or moderate pain.    [provider]  amLODipine (NORVASC) 10 MG tablet Take 10 mg by mouth at bedtime. Patient not taking: Reported on 02/20/2022    [provider]  bismuth subsalicylate (PEPTO BISMOL) 262 MG/15ML suspension Take 30 mLs by mouth every 6 (six) hours as needed for indigestion.    [provider]  cyclobenzaprine (FLEXERIL) 10 MG tablet Take 1 every 8 hours prn pelvic pain on the right Patient not taking: Reported on 08/14/2020 02/29/20   Adline Potter, NP  diphenhydrAMINE (BENADRYL) 25 MG tablet Take 25 mg by mouth every 6 (six) hours as needed for allergies.    [provider]  ferric citrate (AURYXIA) 1 GM 210 MG(Fe) tablet Take 840 mg by mouth 3 (three) times daily with meals.    [provider]  hydroxychloroquine (PLAQUENIL) 200 MG tablet Take 1 tablet (200 mg total) by mouth daily. Patient not taking: Reported on 02/20/2022 01/13/15   Erick Blinks, MD      Allergies    Patient has no known allergies.    Review of Systems   Review of Systems  Cardiovascular:  Positive for chest pain.    Physical Exam Updated Vital  Signs BP (!) 158/85   Pulse 81   Temp 98.5 F (36.9 C) (Oral)   Resp 13   Ht 5\' 6"  (1.676 m)   Wt 79.4 kg   SpO2 96%   BMI 28.25 kg/m  Physical Exam  ED Results / Procedures / Treatments   Labs (all labs ordered are listed, but only abnormal results are displayed) Labs Reviewed  BASIC METABOLIC PANEL - Abnormal; Notable for the following components:      Result Value   Glucose, Bld 103 (*)    BUN 42 (*)    Creatinine, Ser 7.49 (*)    GFR, Estimated 6 (*)    All other components within normal limits  CBC  HEPATIC FUNCTION PANEL  TROPONIN I (HIGH SENSITIVITY)  TROPONIN I (HIGH SENSITIVITY)    EKG EKG Interpretation Date/Time:  Sunday April 26 2023 19:56:02 EST Ventricular Rate:  76 PR Interval:  141 QRS Duration:  88 QT Interval:  400 QTC Calculation: 450 R Axis:   68  Text Interpretation: Sinus rhythm Confirmed by Bethann Berkshire [phone]) on 04/26/2023 9:24:45 PM  Radiology CT ABDOMEN PELVIS WO CONTRAST  Result Date: 04/26/2023 CLINICAL DATA:  Left side abdominal pain EXAM: CT ABDOMEN AND PELVIS WITHOUT CONTRAST TECHNIQUE: Multidetector CT imaging of the abdomen and pelvis was performed following the standard protocol without IV contrast. RADIATION DOSE REDUCTION: This exam was performed according to the departmental dose-optimization program which includes automated  exposure control, adjustment of the mA and/or kV according to patient size and/or use of iterative reconstruction technique. COMPARISON:  08/13/2019 FINDINGS: Lower chest: No acute abnormality Hepatobiliary: No focal liver abnormality is seen. Status post cholecystectomy. No biliary dilatation. Pancreas: No focal abnormality or ductal dilatation. Spleen: Calcifications throughout the spleen compatible with old granulomatous disease. Normal size. Adrenals/Urinary Tract: Atrophic kidneys with numerous small cysts. No follow-up imaging recommended. No stones or hydronephrosis. Adrenal glands unremarkable. Urinary  bladder decompressed. Stomach/Bowel: Normal appendix. Stomach, large and small bowel grossly unremarkable. Vascular/Lymphatic: No evidence of aneurysm or adenopathy. Reproductive: Uterus and adnexa unremarkable.  No mass. Other: No free fluid or free air. Musculoskeletal: No acute bony abnormality. IMPRESSION: No acute findings in the abdomen or pelvis. Electronically Signed   By: Charlett Nose M.D.   On: 04/26/2023 22:13   DG Chest 2 View  Result Date: 04/26/2023 CLINICAL DATA:  Chest pain EXAM: CHEST - 2 VIEW COMPARISON:  02/20/2022 FINDINGS: The heart size and mediastinal contours are within normal limits. Both lungs are clear. The visualized skeletal structures are unremarkable. IMPRESSION: No active cardiopulmonary disease. Electronically Signed   By: Alcide Clever M.D.   On: 04/26/2023 20:19    Procedures Procedures  {Document cardiac monitor, telemetry assessment procedure when appropriate:1}  Medications Ordered in ED Medications  HYDROmorphone (DILAUDID) injection 0.5 mg (0.5 mg Intravenous Given 04/26/23 2138)  pantoprazole (PROTONIX) injection 40 mg (40 mg Intravenous Given 04/26/23 2138)    ED Course/ Medical Decision Making/ A&P   {   Click here for ABCD2, HEART and other calculatorsREFRESH Note before signing :1}                              Medical Decision Making Amount and/or Complexity of Data Reviewed Labs: ordered. Radiology: ordered.  Risk Prescription drug management.   Patient with GERD.  She is started on Protonix and will follow-up with her PCP  {Document critical care time when appropriate:1} {Document review of labs and clinical decision tools ie heart score, Chads2Vasc2 etc:1}  {Document your independent review of radiology images, and any outside records:1} {Document your discussion with family members, caretakers, and with consultants:1} {Document social determinants of health affecting pt's care:1} {Document your decision making why or why not  admission, treatments were needed:1} Final Clinical Impression(s) / ED Diagnoses Final diagnoses:  Gastroesophageal reflux disease with esophagitis without hemorrhage    Rx / DC Orders ED Discharge Orders          Ordered    pantoprazole (PROTONIX) 20 MG tablet  Daily        04/26/23 2336

## 2023-04-26 NOTE — ED Triage Notes (Signed)
Chest pain  Started this morning 7/10 Stabbing pain LEFT side through to her back   Tues, Thurs, Sat dialysis Went yesterday for full treatment   Pt denies nausea  Complains of ABD pain

## 2023-04-26 NOTE — Discharge Instructions (Signed)
Follow-up with your family doctor this week for recheck.  Stay away from spicy foods
